# Patient Record
Sex: Female | Born: 1950
Health system: Southern US, Community
[De-identification: ages and names within clinical notes are randomized; demographics above are authoritative.]

## PROBLEM LIST (undated history)

## (undated) DIAGNOSIS — I4819 Other persistent atrial fibrillation: Secondary | ICD-10-CM

## (undated) DIAGNOSIS — R29818 Other symptoms and signs involving the nervous system: Secondary | ICD-10-CM

## (undated) DIAGNOSIS — R0683 Snoring: Secondary | ICD-10-CM

## (undated) DIAGNOSIS — I5032 Chronic diastolic (congestive) heart failure: Secondary | ICD-10-CM

## (undated) DIAGNOSIS — G4734 Idiopathic sleep related nonobstructive alveolar hypoventilation: Secondary | ICD-10-CM

## (undated) DIAGNOSIS — N182 Chronic kidney disease, stage 2 (mild): Secondary | ICD-10-CM

## (undated) DIAGNOSIS — I10 Essential (primary) hypertension: Secondary | ICD-10-CM

## (undated) DIAGNOSIS — D509 Iron deficiency anemia, unspecified: Secondary | ICD-10-CM

## (undated) DIAGNOSIS — R942 Abnormal results of pulmonary function studies: Secondary | ICD-10-CM

## (undated) DIAGNOSIS — C189 Malignant neoplasm of colon, unspecified: Secondary | ICD-10-CM

## (undated) HISTORY — DX: Chronic kidney disease, stage 2 (mild): N18.2

## (undated) HISTORY — PX: TONSILLECTOMY: SUR1361

## (undated) HISTORY — DX: Abnormal results of pulmonary function studies: R94.2

## (undated) HISTORY — DX: Idiopathic sleep related nonobstructive alveolar hypoventilation: G47.34

## (undated) HISTORY — DX: Other symptoms and signs involving the nervous system: R29.818

## (undated) HISTORY — DX: Malignant neoplasm of colon, unspecified: C18.9

## (undated) HISTORY — DX: Chronic diastolic (congestive) heart failure: I50.32

## (undated) HISTORY — DX: Iron deficiency anemia, unspecified: D50.9

---

## 2003-04-09 ENCOUNTER — Emergency Department (HOSPITAL_COMMUNITY): Admission: EM | Admit: 2003-04-09 | Discharge: 2003-04-09 | Payer: Self-pay | Admitting: Emergency Medicine

## 2004-10-03 ENCOUNTER — Emergency Department (HOSPITAL_COMMUNITY): Admission: EM | Admit: 2004-10-03 | Discharge: 2004-10-03 | Payer: Self-pay | Admitting: Emergency Medicine

## 2015-09-06 ENCOUNTER — Emergency Department (HOSPITAL_COMMUNITY): Payer: Medicare (Managed Care)

## 2015-09-06 ENCOUNTER — Encounter (HOSPITAL_COMMUNITY): Payer: Self-pay | Admitting: *Deleted

## 2015-09-06 ENCOUNTER — Inpatient Hospital Stay (HOSPITAL_COMMUNITY)
Admission: EM | Admit: 2015-09-06 | Discharge: 2015-09-15 | DRG: 308 | Disposition: A | Discharge status: Home / Self Care | Payer: Medicare (Managed Care) | Attending: Internal Medicine | Admitting: Internal Medicine

## 2015-09-06 DIAGNOSIS — Z6841 Body Mass Index (BMI) 40.0 and over, adult: Secondary | ICD-10-CM | POA: Diagnosis not present

## 2015-09-06 DIAGNOSIS — I110 Hypertensive heart disease with heart failure: Secondary | ICD-10-CM | POA: Diagnosis present

## 2015-09-06 DIAGNOSIS — I5023 Acute on chronic systolic (congestive) heart failure: Secondary | ICD-10-CM | POA: Diagnosis present

## 2015-09-06 DIAGNOSIS — R601 Generalized edema: Secondary | ICD-10-CM | POA: Diagnosis not present

## 2015-09-06 DIAGNOSIS — I5021 Acute systolic (congestive) heart failure: Secondary | ICD-10-CM | POA: Diagnosis not present

## 2015-09-06 DIAGNOSIS — E876 Hypokalemia: Secondary | ICD-10-CM | POA: Diagnosis not present

## 2015-09-06 DIAGNOSIS — Z888 Allergy status to other drugs, medicaments and biological substances status: Secondary | ICD-10-CM

## 2015-09-06 DIAGNOSIS — J9601 Acute respiratory failure with hypoxia: Secondary | ICD-10-CM | POA: Diagnosis not present

## 2015-09-06 DIAGNOSIS — I420 Dilated cardiomyopathy: Secondary | ICD-10-CM | POA: Diagnosis not present

## 2015-09-06 DIAGNOSIS — Z79899 Other long term (current) drug therapy: Secondary | ICD-10-CM

## 2015-09-06 DIAGNOSIS — R0602 Shortness of breath: Secondary | ICD-10-CM

## 2015-09-06 DIAGNOSIS — R001 Bradycardia, unspecified: Secondary | ICD-10-CM | POA: Insufficient documentation

## 2015-09-06 DIAGNOSIS — R739 Hyperglycemia, unspecified: Secondary | ICD-10-CM | POA: Diagnosis present

## 2015-09-06 DIAGNOSIS — I509 Heart failure, unspecified: Secondary | ICD-10-CM

## 2015-09-06 DIAGNOSIS — J449 Chronic obstructive pulmonary disease, unspecified: Secondary | ICD-10-CM | POA: Diagnosis present

## 2015-09-06 DIAGNOSIS — I639 Cerebral infarction, unspecified: Secondary | ICD-10-CM

## 2015-09-06 DIAGNOSIS — I4891 Unspecified atrial fibrillation: Secondary | ICD-10-CM | POA: Diagnosis not present

## 2015-09-06 DIAGNOSIS — I481 Persistent atrial fibrillation: Principal | ICD-10-CM | POA: Diagnosis present

## 2015-09-06 DIAGNOSIS — G4733 Obstructive sleep apnea (adult) (pediatric): Secondary | ICD-10-CM | POA: Diagnosis present

## 2015-09-06 DIAGNOSIS — J9621 Acute and chronic respiratory failure with hypoxia: Secondary | ICD-10-CM | POA: Diagnosis present

## 2015-09-06 DIAGNOSIS — R55 Syncope and collapse: Secondary | ICD-10-CM | POA: Diagnosis not present

## 2015-09-06 DIAGNOSIS — I272 Other secondary pulmonary hypertension: Secondary | ICD-10-CM | POA: Diagnosis not present

## 2015-09-06 HISTORY — DX: Snoring: R06.83

## 2015-09-06 HISTORY — DX: Other persistent atrial fibrillation: I48.19

## 2015-09-06 HISTORY — DX: Morbid (severe) obesity due to excess calories: E66.01

## 2015-09-06 LAB — PROTIME-INR
INR: 1.22 (ref 0.00–1.49)
PROTHROMBIN TIME: 15.6 s — AB (ref 11.6–15.2)

## 2015-09-06 LAB — APTT: aPTT: 31 seconds (ref 24–37)

## 2015-09-06 LAB — BASIC METABOLIC PANEL
Anion gap: 12 (ref 5–15)
BUN: 14 mg/dL (ref 6–20)
CHLORIDE: 107 mmol/L (ref 101–111)
CO2: 22 mmol/L (ref 22–32)
CREATININE: 0.95 mg/dL (ref 0.44–1.00)
Calcium: 9 mg/dL (ref 8.9–10.3)
GFR calc Af Amer: >60 mL/min (ref >60)
GFR calc non Af Amer: >60 mL/min (ref >60)
Glucose, Bld: 133 mg/dL — ABNORMAL HIGH (ref 65–99)
Potassium: 3.9 mmol/L (ref 3.5–5.1)
SODIUM: 141 mmol/L (ref 135–145)

## 2015-09-06 LAB — CBC WITH DIFFERENTIAL/PLATELET
Basophils Absolute: 0 10*3/uL (ref 0.0–0.1)
Basophils Relative: 0 %
EOS ABS: 0.1 10*3/uL (ref 0.0–0.7)
EOS PCT: 1 %
HCT: 48.9 % — ABNORMAL HIGH (ref 36.0–46.0)
HEMOGLOBIN: 15.2 g/dL — AB (ref 12.0–15.0)
LYMPHS ABS: 1.5 10*3/uL (ref 0.7–4.0)
Lymphocytes Relative: 16 %
MCH: 28.8 pg (ref 26.0–34.0)
MCHC: 31.1 g/dL (ref 30.0–36.0)
MCV: 92.8 fL (ref 78.0–100.0)
MONO ABS: 0.9 10*3/uL (ref 0.1–1.0)
MONOS PCT: 9 %
NEUTROS PCT: 74 %
Neutro Abs: 6.7 10*3/uL (ref 1.7–7.7)
Platelets: 259 10*3/uL (ref 150–400)
RBC: 5.27 MIL/uL — ABNORMAL HIGH (ref 3.87–5.11)
RDW: 14.5 % (ref 11.5–15.5)
WBC: 9.2 10*3/uL (ref 4.0–10.5)

## 2015-09-06 LAB — I-STAT TROPONIN, ED: TROPONIN I, POC: 0 ng/mL (ref 0.00–0.08)

## 2015-09-06 LAB — MAGNESIUM: MAGNESIUM: 1.8 mg/dL (ref 1.7–2.4)

## 2015-09-06 LAB — BRAIN NATRIURETIC PEPTIDE: B Natriuretic Peptide: 149.2 pg/mL — ABNORMAL HIGH (ref 0.0–100.0)

## 2015-09-06 LAB — TSH: TSH: 0.436 u[IU]/mL (ref 0.350–4.500)

## 2015-09-06 MED ORDER — HEPARIN BOLUS VIA INFUSION
4000.0000 [IU] | Freq: Once | INTRAVENOUS | Status: AC
  Administered 2015-09-06: 4000 [IU] via INTRAVENOUS
  Filled 2015-09-06: qty 4000

## 2015-09-06 MED ORDER — METOPROLOL TARTRATE 1 MG/ML IV SOLN
5.0000 mg | Freq: Once | INTRAVENOUS | Status: AC
  Administered 2015-09-06: 5 mg via INTRAVENOUS
  Filled 2015-09-06: qty 5

## 2015-09-06 MED ORDER — DEXTROSE 5 % IV SOLN
INTRAVENOUS | Status: AC
  Administered 2015-09-06: 10 mg/h via INTRAVENOUS
  Filled 2015-09-06: qty 100

## 2015-09-06 MED ORDER — DEXTROSE 5 % IV SOLN
5.0000 mg/h | INTRAVENOUS | Status: DC
  Administered 2015-09-06 (×2): 10 mg/h via INTRAVENOUS
  Filled 2015-09-06 (×2): qty 100

## 2015-09-06 MED ORDER — FUROSEMIDE 10 MG/ML IJ SOLN
80.0000 mg | Freq: Once | INTRAMUSCULAR | Status: AC
  Administered 2015-09-06: 80 mg via INTRAVENOUS
  Filled 2015-09-06: qty 8

## 2015-09-06 MED ORDER — MAGNESIUM SULFATE 2 GM/50ML IV SOLN
2.0000 g | Freq: Once | INTRAVENOUS | Status: AC
Start: 2015-09-06 — End: 2015-09-06
  Administered 2015-09-06: 2 g via INTRAVENOUS
  Filled 2015-09-06: qty 50

## 2015-09-06 MED ORDER — DILTIAZEM LOAD VIA INFUSION
20.0000 mg | INTRAVENOUS | Status: AC | PRN
  Administered 2015-09-06 (×2): 20 mg via INTRAVENOUS
  Filled 2015-09-06: qty 20

## 2015-09-06 MED ORDER — POTASSIUM CHLORIDE 20 MEQ PO PACK: 40.0000 meq | PACK | Freq: Two times a day (BID) | ORAL | Status: DC

## 2015-09-06 MED ORDER — HEPARIN (PORCINE) IN NACL 100-0.45 UNIT/ML-% IJ SOLN
1350.0000 [IU]/h | INTRAMUSCULAR | Status: DC
  Administered 2015-09-06: 1350 [IU]/h via INTRAVENOUS
  Filled 2015-09-06 (×2): qty 250

## 2015-09-06 MED ORDER — POTASSIUM CHLORIDE CRYS ER 20 MEQ PO TBCR
40.0000 meq | EXTENDED_RELEASE_TABLET | Freq: Two times a day (BID) | ORAL | Status: DC
  Administered 2015-09-06 – 2015-09-13 (×14): 40 meq via ORAL
  Filled 2015-09-06 (×14): qty 2

## 2015-09-06 NOTE — Progress Notes (Signed)
ANTICOAGULATION CONSULT NOTE - Initial Consult  Pharmacy Consult for Heparin Indication: Atrial Fibrillation  Allergies  Allergen Reactions  . Compazine [Prochlorperazine Edisylate] Swelling    Patient Measurements: Height: 5\' 7"  (170.2 cm) Weight: (!) 315 lb (142.883 kg) IBW/kg (Calculated) : 61.6 Heparin Dosing Weight: 97kg  Vital Signs: Temp: 97.8 F (36.6 C) (12/01 1823) Temp Source: Oral (12/01 1823) BP: 130/72 mmHg (12/01 1823) Pulse Rate: 186 (12/01 1823)  Labs: No results for input(s): HGB, HCT, PLT, APTT, LABPROT, INR, HEPARINUNFRC, CREATININE, CKTOTAL, CKMB, TROPONINI in the last 72 hours.  CrCl cannot be calculated (Patient has no serum creatinine result on file.).   Medical History: History reviewed. No pertinent past medical history.  Assessment: 58 yoF admitted with worsening SOB and swelling in feet and legs.  Chest xray suggest mild CHF.  EKG shows atrial fibrillation and pharmacy consulted to start IV heparin.  Heparin dosing wt = 77kg.  No issues noted with CBC.  Pt states not taking any blood thinners PTA.  Med history in process.  No SCr result yet for renal function.    Goal of Therapy:  Heparin level 0.3-0.7 units/ml Monitor platelets by anticoagulation protocol: Yes   Plan:  Heparin bolus 4000 units IV x 1, then start infusion at 1350 units/hr = 13.5 ml/hr.  Ordered baseline PT/INR, aPTT.  F/u heparin level in 6 hours.    Ralene Bathe, PharmD, BCPS 09/06/2015, 6:59 PM  Pager: [phone]

## 2015-09-06 NOTE — ED Notes (Signed)
Pt complains of shortness of breath since Thanksgiving, which became worse today. Pt states the breathing is worse with exertion. Pt states she noticed her legs have been swelling over the past month.

## 2015-09-06 NOTE — ED Notes (Signed)
Hospitalist at bedside 

## 2015-09-06 NOTE — H&P (Signed)
[facility] History and Physical  Amber Medina [id number] DOB: 31-Aug-1951 DOA: 09/06/2015  Referring physician: Tanna Furry, M.D. PCP: No primary care provider on file.   Chief Complaint: Shortness of breath.  HPI: Amber Medina is a 64 y.o. female with no previous past medical history who comes to the emergency department with a 2 week history of progressively worse shortness of breath associated with lower extremities and trunk edema. The patient states that her symptoms started worsening at a faster rate after Thanksgiving, when she noticed worsening of dyspnea and swelling, decreased exertional tolerance and increased fatigue. She denies fever, chills, chest pain or chest pressures, palpitations, dizziness, diaphoresis, PND or orthopnea.   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 During the conference, the patient had an upper respiratory tract infection that temporarily worsened her shortness of breath. She did not have significant symptoms until 2 weeks ago, but particularly in the last week.  She has been and on and off smoker to her adult life, but last quit about 5 months ago. She rarely drinks alcohol, she is states that her last lipid panel was normal, with the exception of mildly low HDL 2 years ago when her mother has a history of congenital heart disease, which she could not specify. She denies having any previous cardiac workup.  When seen in the emergency department, the patient was in no acute distress. She is stated that she was feeling better than when she first came into the ED.   Review of Systems:  Constitutional:  Positive weight gain and fatigue.  No weight loss, night sweats, Fevers, chills, HEENT:  No headaches, Difficulty swallowing,Tooth/dental problems,Sore throat,  No sneezing, itching, ear ache, nasal congestion, post nasal  drip,  Cardio-vascular:  Positive swelling in lower extremities, anasarca No chest pain, Orthopnea, PND, dizziness, palpitations  GI:  No heartburn, indigestion, abdominal pain, nausea, vomiting, diarrhea, change in bowel habits, loss of appetite  Resp:  Positive dyspnea worsened by exertion, no cough no hemoptysis no wheezing. Skin:  no rash or lesions.  GU:  no dysuria, change in color of urine, no urgency or frequency. No flank pain.  Musculoskeletal:  No joint pain or swelling. No decreased range of motion. No back pain.  Psych:  No change in mood or affect. No depression or anxiety. No memory loss.   History reviewed. No pertinent past medical history. Past Surgical History  Procedure Laterality Date  . Tonsillectomy     Social History:  reports that she has quit smoking. She does not have any smokeless tobacco history on file. She reports that she drinks alcohol. She reports that she does not use illicit drugs.  Allergies  Allergen Reactions  . Compazine [Prochlorperazine Edisylate] Swelling    Family History  Problem Relation Age of Onset  . Diabetes Mellitus II Mother   . Hypertension Mother   . Congenital heart disease Mother   . Hypertension Father     Prior to Admission medications   Medication Sig Start Date End Date Taking? Authorizing Provider  Ascorbic Acid (VITAMIN C) 1000 MG tablet Take 2,000 mg by mouth daily.   Yes Historical Provider, MD  b complex vitamins tablet Take 1 tablet by mouth daily.   Yes Historical Provider, MD  Cholecalciferol (VITAMIN D PO) Take by mouth.   Yes Historical Provider, MD  diphenhydrAMINE (BENADRYL) 25 mg capsule  Take 25 mg by mouth every 6 (six) hours as needed for itching or allergies.   Yes Historical Provider, MD  Hyaluronic Acid-Vitamin C (HYALURONIC ACID PO) Take by mouth.   Yes Historical Provider, MD  Multiple Vitamin (MULTIVITAMIN WITH MINERALS) TABS tablet Take 1 tablet by mouth daily.   Yes Historical Provider, MD    OVER THE COUNTER MEDICATION as directed. Allergy eye drops.   Yes Historical Provider, MD   Physical Exam: Filed Vitals:   09/06/15 1910 09/06/15 2032 09/06/15 2100 09/06/15 2142  BP: 126/74 107/84 114/86 135/115  Pulse: 120 68 122 54  Temp:      TempSrc:      Resp: 21 16 27 19   Height:      Weight:      SpO2: 96% 94% 96% 96%    Wt Readings from Last 3 Encounters:  09/06/15 142.883 kg (315 lb)    General:  Appears calm and comfortable Eyes: PERRL, normal lids, irises & conjunctiva ENT: grossly normal hearing, lips and oral mucosa are moist. Neck: no LAD, masses or thyromegaly Cardiovascular: Irregularly irregular, tachycardic, no murmur or rub. 4+ bilateral LE edema. Telemetry: Atrial fibrillation with RVR at 126 bpm. Respiratory: Bibasilar Rales. No wheezing, no rhonchi. Normal respiratory effort. Abdomen: Obese, anasarca, soft, ntnd Skin: no rash or induration seen on limited exam Musculoskeletal: grossly normal tone BUE/BLE Psychiatric: grossly normal mood and affect, speech fluent and appropriate Neurologic: grossly non-focal.          Labs on Admission:  Basic Metabolic Panel:  Recent Labs Lab 09/06/15 1839  NA 141  K 3.9  CL 107  CO2 22  GLUCOSE 133*  BUN 14  CREATININE 0.95  CALCIUM 9.0   CBC:  Recent Labs Lab 09/06/15 1839  WBC 9.2  NEUTROABS 6.7  HGB 15.2*  HCT 48.9*  MCV 92.8  PLT 259    BNP (last 3 results)  Recent Labs  09/06/15 1839  BNP 149.2*   APTT [id number] Collected: 09/06/15 1839    Updated: 09/06/15 1902    Specimen Type: Blood     aPTT 31 seconds   Protime-INR [id number] (Abnormal) Collected: 09/06/15 1839   Updated: 09/06/15 1902    Specimen Type: Blood     Prothrombin Time 15.6 (H) seconds    INR 1.22   I-stat troponin, ED [id number] Collected: 09/06/15 1846   Updated: 09/06/15 1857    Specimen Type: Blood     Troponin i, poc 0.00 ng/mL     Radiological Exams on Admission: Dg Chest Port 1  View  09/06/2015  CLINICAL DATA:  64 year old female with shortness of breath 1 week ago, and swelling in the feet and legs over the last month. EXAM: PORTABLE CHEST 1 VIEW COMPARISON:  No priors. FINDINGS: There is cephalization of the pulmonary vasculature and slight indistinctness of the interstitial markings suggestive of mild pulmonary edema. Mild cardiomegaly. No definite pleural effusions. Upper mediastinal contours are within normal limits. IMPRESSION: 1. The appearance the chest suggests mild congestive heart failure, as above. Electronically Signed   By: Vinnie Langton M.D.   On: 09/06/2015 18:51    EKG: Independently reviewed. Vent. rate 186 BPM PR interval * ms QRS duration 83 ms QT/QTc 267/470 ms P-R-T axes -1 124 -1 Atrial fibrillation with rapid V-rate Left posterior fascicular block Anterior infarct, old Nonspecific T abnormalities, lateral leads Baseline wander in lead(s) V2  Assessment/Plan Principal Problem:   Atrial fibrillation with rapid ventricular response (HCC) Admit the patient to a  stepdown for closer monitoring. Continue IV Cardizem which has partially control the heart rate from the 180s to the 120s. I will add metoprolol 5 mg IV push 1 dose, metoprolol tartrate 25 mg orally single dose tonight and start long-acting metoprolol succinate 50 mg by mouth daily. Check magnesium level and supplement with magnesium sulfate. Cardiology will be evaluating in the morning.  Active Problems:   Hyperglycemia No history of diabetes per patient. She states that 2 years ago her blood glucose and other test were normal. Check hemoglobin A1c in the morning.    Anasarca Continue IV furosemide. Supplemental potassium. Monitor input and output. Start Lisinopril 5 mg by mouth daily tomorrow, once the Cardizem infusion has been taper off. Check echocardiogram.    Obesity, morbid ([place]) Lifestyle modifications were discussed briefly with the patient. She is eager to  make changes in the future that will impact her health in a positive way.  Cardiology was consulted by the emergency department.  Code Status: Full code. DVT Prophylaxis: On full dose IV heparin. Family Communication: Disposition Plan: Admit to a stepdown for heart rate control and further workup.  Time spent: Over 90 minutes were spent during the process of this admission.  Reubin Milan [facility] Pager [phone].

## 2015-09-06 NOTE — ED Provider Notes (Signed)
CSN: [id number]     Arrival date & time 09/06/15  1801 History   First MD Initiated Contact with Patient 09/06/15 1834     No chief complaint on file.     HPI  She presents for evaluation of palpitations congestive heart failure difficulty breathing.  Patient reports shortness of breath for the last month. Acutely worse for the last 48 hours of palpitations and dyspnea.  Overall she relates symptoms back to the end of August. She had a deadline for a project. She feels like she "caught a cold or a virus". She returned from a  Conference and felt poorly with a cough for several weeks. 2 October she started noticing dyspnea with exerting herself but no chest pain. Starting approximately one or 2 weeks ago had marked worsening of this and started developing lower extremity edema that is progressed to involve abdominal swelling. Starting yesterday she felt acutely short of breath and was unable to lay flat. Felt rapid palpitations today and felt "suffocated" presents here tachycardic and dyspneic.  History reviewed. No pertinent past medical history. Past Surgical History  Procedure Laterality Date  . Tonsillectomy     Family History  Problem Relation Age of Onset  . Diabetes Mellitus II Mother   . Hypertension Mother   . Congenital heart disease Mother   . Hypertension Father    Social History  Substance Use Topics  . Smoking status: Former Research scientist (life sciences)  . Smokeless tobacco: None  . Alcohol Use: Yes   OB History    No data available     Review of Systems  Constitutional: Negative for fever, chills, diaphoresis, appetite change and fatigue.  HENT: Negative for mouth sores, sore throat and trouble swallowing.   Eyes: Negative for visual disturbance.  Respiratory: Positive for cough, chest tightness, shortness of breath and wheezing.   Cardiovascular: Positive for palpitations and leg swelling. Negative for chest pain.  Gastrointestinal: Positive for abdominal distention. Negative for  nausea, vomiting, abdominal pain and diarrhea.  Endocrine: Negative for polydipsia, polyphagia and polyuria.  Genitourinary: Negative for dysuria, frequency and hematuria.  Musculoskeletal: Negative for gait problem.  Skin: Negative for color change, pallor and rash.  Neurological: Negative for dizziness, syncope, light-headedness and headaches.  Hematological: Does not bruise/bleed easily.  Psychiatric/Behavioral: Negative for behavioral problems and confusion.      Allergies  Compazine  Home Medications   Prior to Admission medications   Medication Sig Start Date End Date Taking? Authorizing Provider  Ascorbic Acid (VITAMIN C) 1000 MG tablet Take 2,000 mg by mouth daily.   Yes Historical Provider, MD  b complex vitamins tablet Take 1 tablet by mouth daily.   Yes Historical Provider, MD  Cholecalciferol (VITAMIN D PO) Take by mouth.   Yes Historical Provider, MD  diphenhydrAMINE (BENADRYL) 25 mg capsule Take 25 mg by mouth every 6 (six) hours as needed for itching or allergies.   Yes Historical Provider, MD  Hyaluronic Acid-Vitamin C (HYALURONIC ACID PO) Take by mouth.   Yes Historical Provider, MD  Multiple Vitamin (MULTIVITAMIN WITH MINERALS) TABS tablet Take 1 tablet by mouth daily.   Yes Historical Provider, MD  OVER THE COUNTER MEDICATION as directed. Allergy eye drops.   Yes Historical Provider, MD   BP 123/77 mmHg  Pulse 91  Temp(Src) 97.8 F (36.6 C) (Oral)  Resp 18  Ht 5\' 7"  (1.702 m)  Wt 315 lb (142.883 kg)  BMI 49.32 kg/m2  SpO2 96% Physical Exam  Constitutional: She is oriented to person,  place, and time. She appears distressed.  Wonderfully pleasant obese female dyspneic speaking one to 2 words at a time.  HENT:  Head: Normocephalic.  Eyes: Conjunctivae are normal. Pupils are equal, round, and reactive to light. No scleral icterus.  Neck: Normal range of motion. Neck supple. No thyromegaly present.    Cardiovascular: An irregularly irregular rhythm present.  Tachycardia present.  Exam reveals no gallop and no friction rub.   No murmur heard. AF with RVR on the monitor.  Pulmonary/Chest: Effort normal and breath sounds normal. No respiratory distress. She has no wheezes. She has no rales.  Bibasilar crackles. Overall distant breath sounds.  Abdominal: Soft. Bowel sounds are normal. She exhibits no distension. There is no tenderness. There is no rebound.  Musculoskeletal: Normal range of motion.  Neurological: She is alert and oriented to person, place, and time.  Skin: Skin is warm. No rash noted. She is diaphoretic.  Bilateral lower extremity edema except extending to the anterior abdomen abdominal wall and sacrum  Psychiatric: She has a normal mood and affect. Her behavior is normal.    ED Course  Procedures (including critical care time) Labs Review Labs Reviewed  CBC WITH DIFFERENTIAL/PLATELET - Abnormal; Notable for the following:    RBC 5.27 (*)    Hemoglobin 15.2 (*)    HCT 48.9 (*)    All other components within normal limits  BASIC METABOLIC PANEL - Abnormal; Notable for the following:    Glucose, Bld 133 (*)    All other components within normal limits  BRAIN NATRIURETIC PEPTIDE - Abnormal; Notable for the following:    B Natriuretic Peptide 149.2 (*)    All other components within normal limits  PROTIME-INR - Abnormal; Notable for the following:    Prothrombin Time 15.6 (*)    All other components within normal limits  APTT  TSH  HEPARIN LEVEL (UNFRACTIONATED)  MAGNESIUM  I-STAT TROPOININ, ED    Imaging Review Dg Chest Port 1 View  09/06/2015  CLINICAL DATA:  64 year old female with shortness of breath 1 week ago, and swelling in the feet and legs over the last month. EXAM: PORTABLE CHEST 1 VIEW COMPARISON:  No priors. FINDINGS: There is cephalization of the pulmonary vasculature and slight indistinctness of the interstitial markings suggestive of mild pulmonary edema. Mild cardiomegaly. No definite pleural effusions.  Upper mediastinal contours are within normal limits. IMPRESSION: 1. The appearance the chest suggests mild congestive heart failure, as above. Electronically Signed   By: Vinnie Langton M.D.   On: 09/06/2015 18:51   I have personally reviewed and evaluated these images and lab results as part of my medical decision-making.   EKG Interpretation None      MDM   Final diagnoses:  Atrial fibrillation with rapid ventricular response (HCC)  Congestive heart failure, unspecified congestive heart failure chronicity, unspecified congestive heart failure type [facility])    Patient given Cardizem bolus and infusion. Re-bolused with second dose IV Cardizem. Has improvement in rate down to between 95 and 105. Blood pressure remains stable. Does not require acute blood pressure control. Given IV Lasix. Has started to diurese. Clinically and radiographically has minimal left-sided failure. Has marked dependent edema and anasarca. Given IV heparin. I discussed the case with cardiology. They recommend overnight admit here, and will consult in am.  D/W Dr. Olevia Bowens of [facility].  Here planning admit.  CRITICAL CARE Performed by: Tanna Furry JOSEPH   Total critical care time: 60 minutes  Critical care time was exclusive  of separately billable procedures and treating other patients.  Critical care was necessary to treat or prevent imminent or life-threatening deterioration.  Critical care was time spent personally by me on the following activities: development of treatment plan with patient and/or surrogate as well as nursing, discussions with consultants, evaluation of patient's response to treatment, examination of patient, obtaining history from patient or surrogate, ordering and performing treatments and interventions, ordering and review of laboratory studies, ordering and review of radiographic studies, pulse oximetry and re-evaluation of patient's condition. Admission and [place] in a.m.  Cardiology consultation unless the patient becomes unstable. Discussed with Dr. Olevia Bowens of hospitalist. He is here evaluate the patient planning admission.    Tanna Furry, MD 09/06/15 2223

## 2015-09-07 ENCOUNTER — Inpatient Hospital Stay (HOSPITAL_COMMUNITY): Payer: Medicare (Managed Care)

## 2015-09-07 DIAGNOSIS — R55 Syncope and collapse: Secondary | ICD-10-CM

## 2015-09-07 DIAGNOSIS — I469 Cardiac arrest, cause unspecified: Secondary | ICD-10-CM

## 2015-09-07 DIAGNOSIS — I5021 Acute systolic (congestive) heart failure: Secondary | ICD-10-CM | POA: Diagnosis present

## 2015-09-07 DIAGNOSIS — I4891 Unspecified atrial fibrillation: Secondary | ICD-10-CM

## 2015-09-07 DIAGNOSIS — I420 Dilated cardiomyopathy: Secondary | ICD-10-CM

## 2015-09-07 LAB — TROPONIN I
Troponin I: 0.03 ng/mL (ref <0.031)
Troponin I: 0.04 ng/mL — ABNORMAL HIGH (ref <0.031)

## 2015-09-07 LAB — CBC WITH DIFFERENTIAL/PLATELET
BASOS PCT: 0 %
Basophils Absolute: 0 10*3/uL (ref 0.0–0.1)
Eosinophils Absolute: 0.1 10*3/uL (ref 0.0–0.7)
Eosinophils Relative: 1 %
HEMATOCRIT: 45.2 % (ref 36.0–46.0)
Hemoglobin: 14.3 g/dL (ref 12.0–15.0)
Lymphocytes Relative: 11 %
Lymphs Abs: 1.1 10*3/uL (ref 0.7–4.0)
MCH: 28.9 pg (ref 26.0–34.0)
MCHC: 31.6 g/dL (ref 30.0–36.0)
MCV: 91.3 fL (ref 78.0–100.0)
MONO ABS: 0.9 10*3/uL (ref 0.1–1.0)
MONOS PCT: 9 %
NEUTROS ABS: 7.9 10*3/uL — AB (ref 1.7–7.7)
Neutrophils Relative %: 79 %
Platelets: 227 10*3/uL (ref 150–400)
RBC: 4.95 MIL/uL (ref 3.87–5.11)
RDW: 14.5 % (ref 11.5–15.5)
WBC: 10 10*3/uL (ref 4.0–10.5)

## 2015-09-07 LAB — COMPREHENSIVE METABOLIC PANEL
ALBUMIN: 3.5 g/dL (ref 3.5–5.0)
ALK PHOS: 57 U/L (ref 38–126)
ALT: 34 U/L (ref 14–54)
ANION GAP: 10 (ref 5–15)
AST: 34 U/L (ref 15–41)
BUN: 19 mg/dL (ref 6–20)
CALCIUM: 8.9 mg/dL (ref 8.9–10.3)
CO2: 26 mmol/L (ref 22–32)
Chloride: 107 mmol/L (ref 101–111)
Creatinine, Ser: 1.18 mg/dL — ABNORMAL HIGH (ref 0.44–1.00)
GFR calc Af Amer: 54 mL/min — ABNORMAL LOW (ref >60)
GFR calc non Af Amer: 47 mL/min — ABNORMAL LOW (ref >60)
GLUCOSE: 122 mg/dL — AB (ref 65–99)
Potassium: 3.8 mmol/L (ref 3.5–5.1)
SODIUM: 143 mmol/L (ref 135–145)
Total Bilirubin: 1.5 mg/dL — ABNORMAL HIGH (ref 0.3–1.2)
Total Protein: 6.4 g/dL — ABNORMAL LOW (ref 6.5–8.1)

## 2015-09-07 LAB — BASIC METABOLIC PANEL
ANION GAP: 7 (ref 5–15)
BUN: 18 mg/dL (ref 6–20)
CHLORIDE: 107 mmol/L (ref 101–111)
CO2: 28 mmol/L (ref 22–32)
CREATININE: 1.04 mg/dL — AB (ref 0.44–1.00)
Calcium: 8.9 mg/dL (ref 8.9–10.3)
GFR calc non Af Amer: 55 mL/min — ABNORMAL LOW (ref >60)
Glucose, Bld: 123 mg/dL — ABNORMAL HIGH (ref 65–99)
POTASSIUM: 4.6 mmol/L (ref 3.5–5.1)
SODIUM: 142 mmol/L (ref 135–145)

## 2015-09-07 LAB — HEPARIN LEVEL (UNFRACTIONATED)
HEPARIN UNFRACTIONATED: 0.28 [IU]/mL — AB (ref 0.30–0.70)
HEPARIN UNFRACTIONATED: 0.35 [IU]/mL (ref 0.30–0.70)
HEPARIN UNFRACTIONATED: 0.4 [IU]/mL (ref 0.30–0.70)

## 2015-09-07 LAB — D-DIMER, QUANTITATIVE (NOT AT ARMC): D DIMER QUANT: 0.72 ug{FEU}/mL — AB (ref 0.00–0.50)

## 2015-09-07 LAB — GLUCOSE, CAPILLARY: GLUCOSE-CAPILLARY: 188 mg/dL — AB (ref 65–99)

## 2015-09-07 LAB — MRSA PCR SCREENING: MRSA BY PCR: NEGATIVE

## 2015-09-07 LAB — PHOSPHORUS: Phosphorus: 4.2 mg/dL (ref 2.5–4.6)

## 2015-09-07 MED ORDER — METOPROLOL SUCCINATE ER 25 MG PO TB24: 50.0000 mg | ORAL_TABLET | Freq: Every day | ORAL | Status: DC

## 2015-09-07 MED ORDER — FUROSEMIDE 10 MG/ML IJ SOLN
40.0000 mg | Freq: Four times a day (QID) | INTRAMUSCULAR | Status: DC
  Administered 2015-09-07: 40 mg via INTRAVENOUS
  Filled 2015-09-07: qty 4

## 2015-09-07 MED ORDER — FUROSEMIDE 10 MG/ML IJ SOLN
60.0000 mg | Freq: Four times a day (QID) | INTRAMUSCULAR | Status: DC
  Administered 2015-09-07 – 2015-09-08 (×4): 60 mg via INTRAVENOUS
  Filled 2015-09-07 (×5): qty 6

## 2015-09-07 MED ORDER — RIVAROXABAN 20 MG PO TABS: 20.0000 mg | ORAL_TABLET | Freq: Every day | ORAL | Status: DC

## 2015-09-07 MED ORDER — HEPARIN (PORCINE) IN NACL 100-0.45 UNIT/ML-% IJ SOLN
1600.0000 [IU]/h | INTRAMUSCULAR | Status: DC
  Administered 2015-09-07 (×2): 1600 [IU]/h via INTRAVENOUS
  Filled 2015-09-07 (×2): qty 250

## 2015-09-07 MED ORDER — IOHEXOL 300 MG/ML  SOLN
100.0000 mL | Freq: Once | INTRAMUSCULAR | Status: AC | PRN
  Administered 2015-09-07: 100 mL via INTRAVENOUS

## 2015-09-07 MED ORDER — LISINOPRIL 2.5 MG PO TABS
5.0000 mg | ORAL_TABLET | Freq: Every day | ORAL | Status: DC
  Administered 2015-09-07 – 2015-09-11 (×5): 5 mg via ORAL
  Filled 2015-09-07 (×6): qty 2

## 2015-09-07 MED ORDER — IOHEXOL 350 MG/ML SOLN: 100.0000 mL | Freq: Once | INTRAVENOUS | Status: DC | PRN

## 2015-09-07 MED ORDER — METOPROLOL TARTRATE 25 MG PO TABS
25.0000 mg | ORAL_TABLET | Freq: Once | ORAL | Status: AC
  Administered 2015-09-07: 25 mg via ORAL
  Filled 2015-09-07: qty 1

## 2015-09-07 MED ORDER — ALPRAZOLAM 0.5 MG PO TABS: 0.5000 mg | ORAL_TABLET | Freq: Three times a day (TID) | ORAL | Status: DC | PRN

## 2015-09-07 MED ORDER — PERFLUTREN LIPID MICROSPHERE
1.0000 mL | INTRAVENOUS | Status: AC | PRN
  Administered 2015-09-07: 2 mL via INTRAVENOUS
  Filled 2015-09-07: qty 10

## 2015-09-07 MED ORDER — LISINOPRIL 2.5 MG PO TABS: 5.0000 mg | ORAL_TABLET | Freq: Every day | ORAL | Status: DC

## 2015-09-07 MED ORDER — MAGNESIUM SULFATE 2 GM/50ML IV SOLN
2.0000 g | Freq: Once | INTRAVENOUS | Status: AC
  Administered 2015-09-07: 2 g via INTRAVENOUS
  Filled 2015-09-07: qty 50

## 2015-09-07 MED ORDER — HEPARIN (PORCINE) IN NACL 100-0.45 UNIT/ML-% IJ SOLN
1800.0000 [IU]/h | INTRAMUSCULAR | Status: DC
  Administered 2015-09-07: 1600 [IU]/h via INTRAVENOUS
  Administered 2015-09-08 – 2015-09-09 (×2): 1800 [IU]/h via INTRAVENOUS
  Filled 2015-09-07 (×5): qty 250

## 2015-09-07 MED ORDER — METOPROLOL TARTRATE 25 MG PO TABS: 25.0000 mg | ORAL_TABLET | Freq: Two times a day (BID) | ORAL | Status: DC

## 2015-09-07 NOTE — Progress Notes (Signed)
  Echocardiogram 2D Echocardiogram has been performed.  Amber Medina 09/07/2015, 9:36 AM

## 2015-09-07 NOTE — Progress Notes (Addendum)
Pt was on commode in bathroom and passed out for a second. No apparent injuries. BP 160s. HR 80s. RN reviewed tele and no pauses noted during event. Pt had prodrome of dizziness but doesn't remember the actual event. Is on Heparin drip for Afib. Mental status normal immediately after event. Pt placed on bedrest.  Pt has scheduled echo today. Ordered carotid US bilat and CT head stat. Will defer MRI brain to attending this am. Neuro checks.  Clance Boll, NP [facility] Addendum: Pt refused CT head even after explanation of why it was indicated and risks of not getting it done.

## 2015-09-07 NOTE — Progress Notes (Addendum)
   09/07/15 1100  Clinical Encounter Type  Visited With Patient;Other (Comment) (neighbor)  Visit Type Initial;Critical Care;Psychological support;Spiritual support;Social support  Referral From Other (Comment) (rounding d/t information at huddle)  Consult/Referral To Chaplain  Recommendations emotinoal support around health changes  Spiritual Encounters  Spiritual Needs Emotional  Stress Factors  Patient Stress Factors Health changes   Chaplain saw pt rounding due to recent admission, health changes, pt's refusal of CT, recent cardiac events.    Chaplain introduced spiritual care as resource and provided brief emotional support with pt around admission.  Pt's neighbor at bedside.   Ms. Bennison spoke about having avoided going to MD and generally "taking care of things myself."  Hopeful that "they tell me to take an Asprin and go home," but she is aware that she is beginning to need to seek care for her health changes.  Spoke briefly with chaplain about tension around longing to be independent and take care of herself and now being hospitalized.  Encounter was brief due to neighbor's presence at bedside.  Will follow up for continued support around health changes and need for medical interventions.    Ms. Abramowicz has daughter in [place] who is Therapist, sports.  And son in [place], [place].  She states daughter "has been talking to me about going to doctor."    Ms. Buntin feels supported by family and neighbor.      Jerene Pitch [place]

## 2015-09-07 NOTE — Progress Notes (Signed)
Report called to [facility] RN at 2025 for transfer to [facility]. Patient alert and oriented, vital signs stable. Patient transported off unit with [facility] at 2050.

## 2015-09-07 NOTE — Progress Notes (Signed)
CT angio chest and head CT reviewed CT chest negative for PE. Head CT showing possible subacute left occipital infarct versus encephalomalacia. Given her episode of syncope and dizziness recommend MRI/MRA brain to rule out possible stroke. Discussed with patient who does not want to have it done at this time and one some time to think about it. She reasons that the thought of getting an MRi makes her very nervous. I have emphasized her the need to have an MRI to rule out possible underlying stroke. I have placed an order in case she changes her mind.

## 2015-09-07 NOTE — Progress Notes (Addendum)
ANTICOAGULATION CONSULT NOTE - F/u Consult  Pharmacy Consult for Heparin Indication: Atrial Fibrillation  Allergies  Allergen Reactions  . Compazine [Prochlorperazine Edisylate] Swelling    Patient Measurements: Height: 5\' 7"  (170.2 cm) Weight: (!) 355 lb 6.1 oz (161.2 kg) IBW/kg (Calculated) : 61.6 Heparin Dosing Weight: 97kg  Vital Signs: Temp: 98.4 F (36.9 C) (12/02 0800) Temp Source: Oral (12/02 0800) BP: 125/76 mmHg (12/02 0615) Pulse Rate: 74 (12/02 0615)  Labs:  Recent Labs  09/06/15 1839 09/07/15 0150 09/07/15 0655 09/07/15 0926  HGB 15.2*  --  14.3  --   HCT 48.9*  --  45.2  --   PLT 259  --  227  --   APTT 31  --   --   --   LABPROT 15.6*  --   --   --   INR 1.22  --   --   --   HEPARINUNFRC  --  0.28*  --  0.35  CREATININE 0.95 1.04* 1.18*  --   TROPONINI  --  0.03 0.04*  --     Estimated Creatinine Clearance: 75.1 mL/min (by C-G formula based on Cr of 1.18).   Medical History: History reviewed. No pertinent past medical history.  Assessment: 11 yoF admitted with worsening SOB and swelling in feet and legs.  Chest xray suggest mild CHF.  EKG shows atrial fibrillation and pharmacy consulted to start IV heparin.  Heparin dosing wt = 97kg.  No issues noted with CBC.  Pt states not taking any blood thinners PTA.  CrCl~85 ml/min Today, 12/2  0150 HL=0.28, no problems per RN  0926 HL=0.35, no problems per RN (pt fell this am but no bleeding/issues noted)  Goal of Therapy:  Heparin level 0.3-0.7 units/ml Monitor platelets by anticoagulation protocol: Yes   Plan:   continue Heparin drip at 1600 units/hr  Recheck HL in 6 hours  Daily CBC/HL  Dolly Rias RPh 09/07/2015, 10:26 AM Pager [phone]

## 2015-09-07 NOTE — Progress Notes (Addendum)
ANTICOAGULATION CONSULT NOTE - F/u Consult  Pharmacy Consult for Heparin Indication: Atrial Fibrillation  Allergies  Allergen Reactions  . Compazine [Prochlorperazine Edisylate] Swelling    Patient Measurements: Height: 5\' 7"  (170.2 cm) Weight: (!) 357 lb 5.9 oz (162.1 kg) IBW/kg (Calculated) : 61.6 Heparin Dosing Weight: 97kg  Vital Signs: Temp: 98.7 F (37.1 C) (12/02 0000) Temp Source: Oral (12/02 0000) BP: 128/82 mmHg (12/02 0205) Pulse Rate: 82 (12/02 0205)  Labs:  Recent Labs  09/06/15 1839 09/07/15 0150  HGB 15.2*  --   HCT 48.9*  --   PLT 259  --   APTT 31  --   LABPROT 15.6*  --   INR 1.22  --   HEPARINUNFRC  --  0.28*  CREATININE 0.95 1.04*  TROPONINI  --  0.03    Estimated Creatinine Clearance: 85.5 mL/min (by C-G formula based on Cr of 1.04).   Medical History: History reviewed. No pertinent past medical history.  Assessment: 59 yoF admitted with worsening SOB and swelling in feet and legs.  Chest xray suggest mild CHF.  EKG shows atrial fibrillation and pharmacy consulted to start IV heparin.  Heparin dosing wt = 97kg.  No issues noted with CBC.  Pt states not taking any blood thinners PTA.  CrCl~85 ml/min Today, 12/2  0150 HL=0.28, no problems per RN  Goal of Therapy:  Heparin level 0.3-0.7 units/ml Monitor platelets by anticoagulation protocol: Yes   Plan:   Increase Heparin drip to 1600 units/hr  Recheck HL in 6 hours  Daily CBC/HL  Dorrene German 09/07/2015, 2:45 AM

## 2015-09-07 NOTE — Consult Note (Addendum)
Admit date: 09/06/2015 Referring Physician  Dr. Clementeen Graham Primary Physician  None Primary Cardiologist  None Reason for Consultation  Atrial fibrillation with RVR  HPI: Amber Medina is a 64 y.o. female with no previous past medical history who comes to the emergency department with a 2 week history of progressively worsening shortness of breath associated with lower extremities and trunk edema. The patient states that her symptoms started worsening at a faster rate after Thanksgiving, when she noticed worsening of dyspnea and swelling, decreased exertional tolerance and increased fatigue. She denies fever, chills, chest pain or chest pressures, palpitations, dizziness, diaphoresis, PND or orthopnea.   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 During the conference, the patient had an upper respiratory tract infection that temporarily worsened her shortness of breath. She did not have significant symptoms until 2 weeks ago, but particularly in the last week.  She has been and on and off smoker to her adult life, but last quit about 5 months ago. She rarely drinks alcohol, she is states that her last lipid panel was normal, with the exception of mildly low HDL 2 years ago when her mother has a history of congenital heart disease, which she could not specify. She denies having any previous cardiac workup.  In ER she was markedly edematous with anasarca and was noted to be in atrial fibrillation with RVR.  Chest xray showed mild CHF.  She was started on IV Lasix.  She was also started on IV lopressor for rate control.  Cardiology is now asked to evaluate.  2D echo showed at least mildly reduced LVF as well as mildly dilated and reduced RVF.  There is a trivial pericardial effusion.  TSH is normal.  This am patient had dizziness with ? syncope - this was very brief while sitting on  the commode.  BP at the time was 99991111 systolic.  And HR 80's.  Telemetry showed no pauses at that time.  She had another episode this am where her face went blue and O2 sats dropped to the 70's.  Telemetry showed bradycardia and then reportedly HR in the 20's.  The telemetry was reviewed and HR definitely was low but then cannot tell if patient went asystolic for a few seconds or leads came off.      PMH:  History reviewed. No pertinent past medical history.   PSH:   Past Surgical History  Procedure Laterality Date  . Tonsillectomy      Allergies:  Compazine Prior to Admit Meds:   Prescriptions prior to admission  Medication Sig Dispense Refill Last Dose  . Ascorbic Acid (VITAMIN C) 1000 MG tablet Take 2,000 mg by mouth daily.   Past Month at Unknown time  . b complex vitamins tablet Take 1 tablet by mouth daily.   Past Month at Unknown time  . Cholecalciferol (VITAMIN D PO) Take by mouth.   Past Month at Unknown time  . diphenhydrAMINE (BENADRYL) 25 mg capsule Take 25 mg by mouth every 6 (six) hours as needed for itching or allergies.   Past Week at Unknown time  . Hyaluronic Acid-Vitamin C (HYALURONIC ACID PO) Take by mouth.   Past Month at Unknown time  . Multiple Vitamin (MULTIVITAMIN WITH MINERALS) TABS tablet Take 1 tablet by mouth daily.   Past Month at Unknown time  . OVER THE COUNTER MEDICATION  as directed. Allergy eye drops.   Past Month at Unknown time   Fam HX:    Family History  Problem Relation Age of Onset  . Diabetes Mellitus II Mother   . Hypertension Mother   . Congenital heart disease Mother   . Hypertension Father    Social HX:    Social History   Social History  . Marital Status: Single    Spouse Name: N/A  . Number of Children: N/A  . Years of Education: N/A   Occupational History  . Not on file.   Social History Main Topics  . Smoking status: Former Research scientist (life sciences)  . Smokeless tobacco: Not on file  . Alcohol Use: Yes  . Drug Use: No  . Sexual Activity:  Not on file   Other Topics Concern  . Not on file   Social History Narrative  . No narrative on file     ROS:  All 11 ROS were addressed and are negative except what is stated in the HPI  Physical Exam: Blood pressure 142/80, pulse 114, temperature 98.4 F (36.9 C), temperature source Oral, resp. rate 19, height 5\' 7"  (1.702 m), weight 161.2 kg (355 lb 6.1 oz), SpO2 96 %.    General: Well developed, well nourished, in no acute distress Head: Eyes PERRLA, No xanthomas.   Normal cephalic and atramatic  Lungs:   Clear bilaterally to auscultation and percussion. Heart:   Irregularly irregular S1 S2 Pulses are 2+ & equal. Abdomen: Bowel sounds are positive, abdomen soft and non-tender without masses Extremities:   No clubbing, cyanosis or edema.  DP +1 Neuro: Alert and oriented X 3. Psych:  Good affect, responds appropriately    Labs:   Lab Results  Component Value Date   WBC 10.0 09/07/2015   HGB 14.3 09/07/2015   HCT 45.2 09/07/2015   MCV 91.3 09/07/2015   PLT 227 09/07/2015    Recent Labs Lab 09/07/15 0655  NA 143  K 3.8  CL 107  CO2 26  BUN 19  CREATININE 1.18*  CALCIUM 8.9  PROT 6.4*  BILITOT 1.5*  ALKPHOS 57  ALT 34  AST 34  GLUCOSE 122*   No results found for: PTT Lab Results  Component Value Date   INR 1.22 09/06/2015   Lab Results  Component Value Date   TROPONINI 0.04* 09/07/2015    No results found for: CHOL No results found for: HDL No results found for: LDLCALC No results found for: TRIG No results found for: CHOLHDL No results found for: LDLDIRECT    Radiology:  Dg Chest Port 1 View  09/06/2015  CLINICAL DATA:  64 year old female with shortness of breath 1 week ago, and swelling in the feet and legs over the last month. EXAM: PORTABLE CHEST 1 VIEW COMPARISON:  No priors. FINDINGS: There is cephalization of the pulmonary vasculature and slight indistinctness of the interstitial markings suggestive of mild pulmonary edema. Mild  cardiomegaly. No definite pleural effusions. Upper mediastinal contours are within normal limits. IMPRESSION: 1. The appearance the chest suggests mild congestive heart failure, as above. Electronically Signed   By: Vinnie Langton M.D.   On: 09/06/2015 18:51    EKG:  Atrial fibrillation with RVR, anterior infarct and slight upsloping ST segment depression in the lateral precordial leads.  ASSESSMENT/PLAN: 1. Acute systolic CHF of ? Etiology.  She had a URI a few months back so this could be due to viral cardiomyopathy.  Also need to consider that afib may have been  initial event which led to a tachycardia mediated CM.  She does have risk factors for CAD including tobacco use and postmenopausal state so need to consider ischemia.  Continue IV Lasix as you are doing.  Stop Cardizem gtt due to LV dysfunction. Last dose of BB was last night. No BB at present due to presyncope (see below).  Add Lisinopril 5mg  daily for LV dysfunction.  2.  Atrial fibrillation with RVR - patient was on Cardizem gtt but stopped due to bradycardia with presyncope and ? Asystole.  CHADS2VASC score is 2 with adjusted stroke rate of 2.2% per year. Will continue IV Heparin for now until determined if patient needs PPM. Ultimately will place on NOAC.   BB has been stopped as well.    3.  Elevated D-Dimer - recommend Chest CT to rule out PE  4.  Dizziness with ? syncope - this was very brief while sitting on the commode.  BP at the time was 99991111 systolic And HR 99991111.  Telemetry showed no pauses at that time.  She had another episode this am where her face went blue and O2 sats dropped to the 70's.  Telemetry showed bradycardia and then reportedly HR in the 20's.  The telemetry was reviewed and HR definitely was low but then cannot tell if patient went asystolic for a few seconds or leads came off.  Hold on BB for now.    Would recommend transfer to [facility] so that she can be followed on tele and get EP consult.  [name], MD   09/07/2015  1:02 PM

## 2015-09-07 NOTE — Progress Notes (Signed)
Pharmacy - Heparin IV  Assessment:    Please see note from Dolly Rias, PharmD earlier today for full details.  Briefly, 64 y.o. female on heparin for atrial fibrillation while awaiting possible pacemaker placement.     Confirmatory heparin level stable in goal range on 1600 units/hr  No change in CBC; no new bleeding documented  CT negative for PE  CT head shows possible subacute infarct without hemorrhage.  Patient refusing MRI.  Plan:   Continue IV heparin infusion at 1600 units/hr  Will reduce heparin level goal range to 0.3 - 0.5 given possible subacute stroke (does not seem critical enough at this point to warrant stopping full-dose heparin altogether)  Daily heparin level/CBC  Reuel Boom, PharmD, BCPS Pager: [phone] 09/07/2015, 5:24 PM

## 2015-09-07 NOTE — Progress Notes (Signed)
[facility] PROGRESS NOTE  Amber Medina [id number] DOB: [birth year] DOA: 09/06/2015 PCP: No primary care provider on file.  brief narrative 64 year old obese female with no past medical history presented to [facility] ED with progressive shortness of breath for almost 2 months which has worsened for the past 2 weeks with associated edema offered trunk and lower legs. Informs having some dyspnea and occasional leg swelling noticed off and on since summer when she was standing and working long hours. She reports increased fatigue with dyspnea on minimal exertion for the past 1 week. Denies any fever, chills, chest pain, palpitations, dizziness, syncope, orthopnea or PND. Denies similar symptoms in the past or any family history of heart failure or sudden cardiac death. (Reports that her mother had some congenital heart disease). Patient has a long history of smoking (one pack per day but quit 5 months back), drinks occasionally and no illicit drug use. In the ED patient was found to have volume overload with new onset A. fib with rapid ventricular rate. Patient admitted to stepdown unit on IV Lasix and Cardizem drip. Overnight patient had episodes of asystole in the monitor and oxygen desaturation to the 70s this morning when her face became diaphoretic and below. Cardizem drip was discontinued. Patient was started on IV heparin on admission. Patient went to the bathroom early this morning and had a brief episode of syncope while sitting on the commode. Her vitals at that time was stable. Patient does not recall the incidents. Cardiology consulted for further management.  Assessment/Plan: Acute systolic CHF No clear etiology. Reports having URI symptoms 2 months back. Unclear if underlying A. fib contributing to her symptoms. Continue aggressive diuresis with IV Lasix (60 mg every 6 hours). No beta blocker or Cardizem due to asystole and syncope. Follow 2-D echo results. Monitor strict I/O and  daily weight. (Foley catheter placed this morning for close urine output monitoring). Patient started on IV heparin. -Further management per cardiology.  A. fib with RVR Started on Cardizem drip on admission but discontinued due to syncope and bradycardia with? Asystole. Telemetry does not show any pauses or NSVT. Electrolytes normal. (Magnesium of 1.8 which was replenished). Started on IV heparin on admission. Switch to Xarelto by cardiology today. CHADSvasc 2 score of 2.  Appreciate cardiology evaluation. Recommend transfer to [facility] stepdown for EP evaluation.  -syncope Unclear if this is vasovagal versus related to arrhythmia. Follow 2-D echo results. Carotid Doppler without significant stenosis. Patient also hypoxic. D-dimer mildly elevated. Will plan on CT angiogram of the chest to rule out PE. (Patient refused to have this done this morning but now agrees).  Morbid obesity  DVT prophylaxis: Xarelto Diet: Heart healthy  Code Status: Full code Family Communication: None at bedside. Patient lives alone  Disposition Plan: Spoke to [facility] stepdown unit.     Consultants:  Cardiology  Procedures:  2-D echo  CT angiogram of the chest  Antibiotics:  None  HPI/Subjective: Seen and examined. Admission H&P reviewed. Patient had episode of syncope this morning while in the bathroom and had episodes of asystole on the monitor with oxygen desaturation. Cardizem drip discontinued  Objective: Filed Vitals:   09/07/15 1100 09/07/15 1200  BP: 113/78 142/80  Pulse: 30 114  Temp:  98.4 F (36.9 C)  Resp: 15 19    Intake/Output Summary (Last 24 hours) at 09/07/15 1354 Last data filed at 09/07/15 1200  Gross per 24 hour  Intake 1054.43 ml  Output   1000 ml  Net  54.43 ml   Filed Weights   09/06/15 1813 09/07/15 0000 09/07/15 0615  Weight: 142.883 kg (315 lb) 162.1 kg (357 lb 5.9 oz) 161.2 kg (355 lb 6.1 oz)    Exam:   General: Elderly morbidly obese female  not in distress  HEENT: No pallor, moist oral mucosa, JVD+  Chest: Diminished bibasilar breath sounds due to body habitus, no added sounds  Cardiovascular: S1 and S2 irregularly irregular, no murmurs rubs or gallop  I: Soft, nondistended, nontender, pitting edema  Musculoskeletal: Warm, bilateral 2+ pitting edema  CNS: Alert and oriented  Data Reviewed: Basic Metabolic Panel:  Recent Labs Lab 09/06/15 1839 09/06/15 2011 09/07/15 0150 09/07/15 0655  NA 141  --  142 143  K 3.9  --  4.6 3.8  CL 107  --  107 107  CO2 22  --  28 26  GLUCOSE 133*  --  123* 122*  BUN 14  --  18 19  CREATININE 0.95  --  1.04* 1.18*  CALCIUM 9.0  --  8.9 8.9  MG  --  1.8  --   --   PHOS  --   --  4.2  --    Liver Function Tests:  Recent Labs Lab 09/07/15 0655  AST 34  ALT 34  ALKPHOS 57  BILITOT 1.5*  PROT 6.4*  ALBUMIN 3.5   No results for input(s): LIPASE, AMYLASE in the last 168 hours. No results for input(s): AMMONIA in the last 168 hours. CBC:  Recent Labs Lab 09/06/15 1839 09/07/15 0655  WBC 9.2 10.0  NEUTROABS 6.7 7.9*  HGB 15.2* 14.3  HCT 48.9* 45.2  MCV 92.8 91.3  PLT 259 227   Cardiac Enzymes:  Recent Labs Lab 09/07/15 0150 09/07/15 0655  TROPONINI 0.03 0.04*   BNP (last 3 results)  Recent Labs  09/06/15 1839  BNP 149.2*    ProBNP (last 3 results) No results for input(s): PROBNP in the last 8760 hours.  CBG:  Recent Labs Lab 09/07/15 0507  GLUCAP 188*    Recent Results (from the past 240 hour(s))  MRSA PCR Screening     Status: None   Collection Time: 09/07/15 12:12 AM  Result Value Ref Range Status   MRSA by PCR NEGATIVE NEGATIVE Final    Comment:        The GeneXpert MRSA Assay (FDA approved for NASAL specimens only), is one component of a comprehensive MRSA colonization surveillance program. It is not intended to diagnose MRSA infection nor to guide or monitor treatment for MRSA infections.      Studies: Dg Chest Port 1  View  09/06/2015  CLINICAL DATA:  64 year old female with shortness of breath 1 week ago, and swelling in the feet and legs over the last month. EXAM: PORTABLE CHEST 1 VIEW COMPARISON:  No priors. FINDINGS: There is cephalization of the pulmonary vasculature and slight indistinctness of the interstitial markings suggestive of mild pulmonary edema. Mild cardiomegaly. No definite pleural effusions. Upper mediastinal contours are within normal limits. IMPRESSION: 1. The appearance the chest suggests mild congestive heart failure, as above. Electronically Signed   By: Vinnie Langton M.D.   On: 09/06/2015 18:51    Scheduled Meds: . furosemide  60 mg Intravenous Q6H  . lisinopril  5 mg Oral Daily  . potassium chloride  40 mEq Oral BID  . rivaroxaban  20 mg Oral Q supper   Continuous Infusions:     Time spent: 35 minutes  Louellen Molder  [facility] Pager [phone] If 7PM-7AM, please contact night-coverage at [email or web address], password [facility] 09/07/2015, 1:54 PM  LOS: 1 day

## 2015-09-07 NOTE — Progress Notes (Signed)
Was the fall witnessed: No  Patient condition before and after the fall: Before fall pt walked to bathroom. Alert, oriented, and vital signs stable. After fall pt alert and oriented and vital signs stable. Pt visablly shaken, but joking with staff.  Patient's reaction to the fall: Pt visablly shaken by event but states she felt embarassed and denies pain.   Name of the doctor that was notified including date and time: Forrest Moron, NP notified 09/07/15 0535  Any interventions and vital signs: CBG 188.  Vitals as pt sat on floor: BP 168/119, SpO2 94% on room air, HR 111 atrial fib/flutter. Vitals after pt back in bed: BP 138/89, SpO2 93% on room air, HR 94 atrial fib/flutter. Head CT and Carotid US ordered. Pt refused head CT before and after education on why head CT was ordered. NP notified.

## 2015-09-07 NOTE — Care Management Note (Signed)
Case Management Note  Patient Details  Name: Mafalda Washa MRN: [id number] Date of Birth: [birth year]  Subjective/Objective:    chf with a.fib and rvr                Action/Plan:Date: September 07, 2015 Chart reviewed for concurrent status and case management needs. Will continue to follow patient for changes and needs:  [facility] for heart failure screening. Velva Harman, RN, BSN, [place]   [phone]   Expected Discharge Date:                  Expected Discharge Plan:  Home/Self Care  In-House Referral:  NA  Discharge planning Services  CM Consult  Post Acute Care Choice:    Choice offered to:     DME Arranged:    DME Agency:     HH Arranged:  Disease Management [place] Agency:  [place]  Status of Service:     Medicare Important Message Given:    Date Medicare IM Given:    Medicare IM give by:    Date Additional Medicare IM Given:    Additional Medicare Important Message give by:     If discussed at [place] of Stay Meetings, dates discussed:    Additional Comments:  Leeroy Cha, RN 09/07/2015, 10:38 AM

## 2015-09-07 NOTE — Progress Notes (Signed)
VASCULAR LAB PRELIMINARY  PRELIMINARY  PRELIMINARY  PRELIMINARY  Carotid duplex  completed.    Preliminary report:  Bilateral:  1-39% ICA stenosis.  Vertebral artery flow is antegrade.      Hawley Pavia, RVT 09/07/2015, 10:53 AM

## 2015-09-08 DIAGNOSIS — R001 Bradycardia, unspecified: Secondary | ICD-10-CM | POA: Insufficient documentation

## 2015-09-08 DIAGNOSIS — J9601 Acute respiratory failure with hypoxia: Secondary | ICD-10-CM | POA: Insufficient documentation

## 2015-09-08 LAB — HEMOGLOBIN A1C
Hgb A1c MFr Bld: 5.9 % — ABNORMAL HIGH (ref 4.8–5.6)
MEAN PLASMA GLUCOSE: 123 mg/dL

## 2015-09-08 LAB — CBC
HCT: 46.9 % — ABNORMAL HIGH (ref 36.0–46.0)
HEMOGLOBIN: 14.2 g/dL (ref 12.0–15.0)
MCH: 28.2 pg (ref 26.0–34.0)
MCHC: 30.3 g/dL (ref 30.0–36.0)
MCV: 93.1 fL (ref 78.0–100.0)
Platelets: 233 10*3/uL (ref 150–400)
RBC: 5.04 MIL/uL (ref 3.87–5.11)
RDW: 14.7 % (ref 11.5–15.5)
WBC: 9.4 10*3/uL (ref 4.0–10.5)

## 2015-09-08 LAB — BASIC METABOLIC PANEL
ANION GAP: 11 (ref 5–15)
BUN: 19 mg/dL (ref 6–20)
CALCIUM: 9.3 mg/dL (ref 8.9–10.3)
CO2: 26 mmol/L (ref 22–32)
Chloride: 104 mmol/L (ref 101–111)
Creatinine, Ser: 1.11 mg/dL — ABNORMAL HIGH (ref 0.44–1.00)
GFR calc Af Amer: 59 mL/min — ABNORMAL LOW (ref >60)
GFR, EST NON AFRICAN AMERICAN: 51 mL/min — AB (ref >60)
GLUCOSE: 135 mg/dL — AB (ref 65–99)
Potassium: 4.2 mmol/L (ref 3.5–5.1)
Sodium: 141 mmol/L (ref 135–145)

## 2015-09-08 LAB — HEPARIN LEVEL (UNFRACTIONATED)
HEPARIN UNFRACTIONATED: 0.24 [IU]/mL — AB (ref 0.30–0.70)
HEPARIN UNFRACTIONATED: 0.51 [IU]/mL (ref 0.30–0.70)

## 2015-09-08 MED ORDER — DIGOXIN 0.25 MG/ML IJ SOLN
0.2500 mg | Freq: Four times a day (QID) | INTRAMUSCULAR | Status: AC
Start: 2015-09-08 — End: 2015-09-09
  Administered 2015-09-08 – 2015-09-09 (×3): 0.25 mg via INTRAVENOUS
  Filled 2015-09-08 (×8): qty 1

## 2015-09-08 MED ORDER — FUROSEMIDE 10 MG/ML IJ SOLN
60.0000 mg | Freq: Three times a day (TID) | INTRAMUSCULAR | Status: DC
  Administered 2015-09-08 – 2015-09-12 (×11): 60 mg via INTRAVENOUS
  Filled 2015-09-08 (×12): qty 6

## 2015-09-08 MED ORDER — DILTIAZEM HCL 100 MG IV SOLR
5.0000 mg/h | INTRAVENOUS | Status: DC
  Administered 2015-09-08: 5 mg/h via INTRAVENOUS
  Administered 2015-09-08 – 2015-09-09 (×2): 7.5 mg/h via INTRAVENOUS
  Filled 2015-09-08 (×3): qty 100

## 2015-09-08 NOTE — Progress Notes (Signed)
ANTICOAGULATION CONSULT NOTE - Follow Up Consult  Pharmacy Consult for Heparin Indication: atrial fibrillation  Allergies  Allergen Reactions  . Compazine [Prochlorperazine Edisylate] Swelling    Patient Measurements: Height: 5\' 7"  (170.2 cm) Weight: (!) 348 lb 6.4 oz (158.033 kg) IBW/kg (Calculated) : 61.6 Heparin Dosing Weight: 101 kg  Vital Signs: Temp: 97.7 F (36.5 C) (12/03 1125) Temp Source: Oral (12/03 1125) BP: 114/78 mmHg (12/03 1125) Pulse Rate: 107 (12/03 1125)  Labs:  Recent Labs  09/06/15 1839 09/07/15 0150 09/07/15 0655  09/07/15 1604 09/08/15 0953 09/08/15 1901  HGB 15.2*  --  14.3  --   --  14.2  --   HCT 48.9*  --  45.2  --   --  46.9*  --   PLT 259  --  227  --   --  233  --   APTT 31  --   --   --   --   --   --   LABPROT 15.6*  --   --   --   --   --   --   INR 1.22  --   --   --   --   --   --   HEPARINUNFRC  --  0.28*  --   < > 0.40 0.24* 0.51  CREATININE 0.95 1.04* 1.18*  --   --  1.11*  --   TROPONINI  --  0.03 0.04*  --   --   --   --   < > = values in this interval not displayed.  Estimated Creatinine Clearance: 78.9 mL/min (by C-G formula based on Cr of 1.11).   Medications:  Scheduled:  . digoxin  0.25 mg Intravenous Q6H  . furosemide  60 mg Intravenous Q8H  . lisinopril  5 mg Oral Daily  . potassium chloride  40 mEq Oral BID   Infusions:  . diltiazem (CARDIZEM) infusion 7.5 mg/hr (09/08/15 1100)  . heparin 1,800 Units/hr (09/08/15 1204)    Assessment: 64 yo F started on heparin infusion for new onset afib with RVR, currently on Dilt gtt as well.  Heparin level is slightly high at 0.51 on 1800 units/hr.   Of note, pt did have CT scan which showed possible subacute infarct without hemorrhage and thus we are targeting a lower heparin level goal per pharmacy protocol.  Goal of Therapy:  Heparin level 0.3-0.5 units/ml Monitor platelets by anticoagulation protocol: Yes   Plan:  Continue heparin at 1800 units/hr (This level  should keep the patient near the higher end of the lower goal) Continue daily heparin level and CBC Follow up plans for oral anticoagulation  Levester Fresh, PharmD, BCPS, [facility] Clinical Pharmacist Pager [phone] 09/08/2015 8:08 PM

## 2015-09-08 NOTE — Progress Notes (Signed)
ANTICOAGULATION CONSULT NOTE - Follow Up Consult  Pharmacy Consult for Heparin Indication: atrial fibrillation  Allergies  Allergen Reactions  . Compazine [Prochlorperazine Edisylate] Swelling    Patient Measurements: Height: 5\' 7"  (170.2 cm) Weight: (!) 348 lb 6.4 oz (158.033 kg) IBW/kg (Calculated) : 61.6 Heparin Dosing Weight: 101 kg  Vital Signs: Temp: 98.6 F (37 C) (12/03 0755) Temp Source: Oral (12/03 0755) BP: 127/106 mmHg (12/03 0755) Pulse Rate: 94 (12/03 0755)  Labs:  Recent Labs  09/06/15 1839  09/07/15 0150 09/07/15 0655 09/07/15 0926 09/07/15 1604 09/08/15 0953  HGB 15.2*  --   --  14.3  --   --  14.2  HCT 48.9*  --   --  45.2  --   --  46.9*  PLT 259  --   --  227  --   --  233  APTT 31  --   --   --   --   --   --   LABPROT 15.6*  --   --   --   --   --   --   INR 1.22  --   --   --   --   --   --   HEPARINUNFRC  --   < > 0.28*  --  0.35 0.40 0.24*  CREATININE 0.95  --  1.04* 1.18*  --   --  1.11*  TROPONINI  --   --  0.03 0.04*  --   --   --   < > = values in this interval not displayed.  Estimated Creatinine Clearance: 78.9 mL/min (by C-G formula based on Cr of 1.11).   Medications:  Scheduled:  . digoxin  0.25 mg Intravenous Q6H  . furosemide  60 mg Intravenous Q6H  . lisinopril  5 mg Oral Daily  . potassium chloride  40 mEq Oral BID   Infusions:  . diltiazem (CARDIZEM) infusion 7.5 mg/hr (09/08/15 1100)  . heparin 1,600 Units/hr (09/08/15 0200)    Assessment: 64 yo F started on heparin infusion for new onset afib with RVR, currently on Dilt gtt as well.  Heparin level is subtherapeutic on 1600 units/hr.  Will adjust accordingly.  Of note, pt did have CT scan which showed possible subacute infarct without hemorrhage and thus we are targeting a lower heparin level goal per pharmacy protocol.  Goal of Therapy:  Heparin level 0.3-0.5 units/ml Monitor platelets by anticoagulation protocol: Yes   Plan:  Increase hepatin to 1800  units/hr Heparin level in 6 hours Continue daily heparin level and CBC Follow up plans for oral anticoagulation  [employer], Pharm.D., BCPS Clinical Pharmacist Pager [phone] 09/08/2015 12:03 PM

## 2015-09-08 NOTE — Progress Notes (Addendum)
PATIENT DETAILS Name: Amber Medina Age: 64 y.o. Sex: female Date of Birth: [birth year] Admit Date: 09/06/2015 Admitting Physician Reubin Milan, MD PCP:No primary care provider on file.  Brief narrative: 64 year old morbidly obese female presented to the hospital with worsening shortness of breath and lower extremity edema. In the emergency room, patient was found to have A. fib with RVR. Started on IV Cardizem, IV Lasix and admitted for further evaluation and treatment. Hospital course has been complicated by development of episodes of apnea and desaturation along with bradycardia/pauses.   Subjective: Feels much better. Still in A. fib RVR.  Assessment/Plan: Acute systolic heart failure along with right-sided heart failure: Much more compensated, decrease Lasix to 60 mg IV every 8 hours. Suspect left ventricular failure worsened by A. fib RVR, right-sided heart failure likely secondary to pulmonary hypertension/obesity. Cardiology following.  A. fib RVR: Heart rate in the 160s this morning-spoke with cardiology-Dr. Wynonia Lawman advised to restart Cardizem infusion and IV digoxin. Continue IV heparin-we will transition to oral anticoagulant in the next few days. CHADS2VASC score is 2.Defer EP consultation to cardiology. Follow.   History of bradycardia/sinus pauses with hypoxemia: Suspect secondary to OSA-PCCM consulted-CPAP QHS & PRN Sleep. Will need Assess for ambulatory O2 needs prior to discharge and outpatient sleep study after discharge  OSA/OSH:see above  Acute on chronic hypoxic respiratory failure: Suspect more for chronic issue secondary to above. Oxygen as needed, nocturnal O2 saturation recommended by PCCM  Morbid obesity: Counseled regarding importance of weight loss  Mild multinodular goiter, with a dominant 2.2 cm left thyroid lobe nodule: Incidental finding on CT chest. Thyroid ultrasound when more stable-suspect could be done as an outpatient   Low-density  Lesion in the left occipital lobe on CT head: CT head report this area is either subacute CVA or encephalomalacia, suspect the latter as no strokelike symptoms and complete nonfocal exam. MRI brain ordered for further delineation-however patient is claustrophobic and not keen on getting it. Carotid Doppler negative for stenosis, echocardiogram showed mild systolic dysfunction-is already on anticoagulation for atrial fibrillation. Suspect even if this is a small CVA-MRI brain will not change overall management. A1c 5.9, check LDL.  Disposition: Remain inpatient  Antimicrobial agents  See below  Anti-infectives    None      DVT Prophylaxis: IV Heparin   Code Status: Full code   Family Communication None at bedside  Procedures: None  CONSULTS:  cardiology and pulmonary/intensive care  Time spent 30 minutes-Greater than 50% of this time was spent in counseling, explanation of diagnosis, planning of further management, and coordination of care.  MEDICATIONS: Scheduled Meds: . furosemide  60 mg Intravenous Q6H  . lisinopril  5 mg Oral Daily  . potassium chloride  40 mEq Oral BID   Continuous Infusions: . heparin 1,600 Units/hr (09/08/15 0200)   PRN Meds:.ALPRAZolam, iohexol    PHYSICAL EXAM: Vital signs in last 24 hours: Filed Vitals:   09/07/15 2220 09/07/15 2324 09/08/15 0200 09/08/15 0432  BP: 112/91 112/87 114/82   Pulse: 113 132 116   Temp:  97.4 F (36.3 C)  98.5 F (36.9 C)  TempSrc:  Oral  Oral  Resp: 23 20 22    Height:      Weight:    158.033 kg (348 lb 6.4 oz)  SpO2: 96% 95% 96%     Weight change: 16.617 kg (36 lb 10.1 oz) Filed Weights   09/07/15 0615 09/07/15  2122 09/08/15 0432  Weight: 161.2 kg (355 lb 6.1 oz) 159.5 kg (351 lb 10.1 oz) 158.033 kg (348 lb 6.4 oz)   Body mass index is 54.55 kg/(m^2).   Gen Exam: Awake and alert with clear speech.   Neck: Supple, No JVD.   Chest: B/L Clear.   CVS: S1 S2 irregular-tachy, no murmurs.  Abdomen:  soft, BS +, non tender, non distended.  Extremities: + edema, lower extremities warm to touch. Neurologic: Non Focal.   Skin: No Rash.   Wounds: N/A.   Intake/Output from previous day:  Intake/Output Summary (Last 24 hours) at 09/08/15 0723 Last data filed at 09/08/15 0600  Gross per 24 hour  Intake   1142 ml  Output   5300 ml  Net  -4158 ml     LAB RESULTS: CBC  Recent Labs Lab 09/06/15 1839 09/07/15 0655  WBC 9.2 10.0  HGB 15.2* 14.3  HCT 48.9* 45.2  PLT 259 227  MCV 92.8 91.3  MCH 28.8 28.9  MCHC 31.1 31.6  RDW 14.5 14.5  LYMPHSABS 1.5 1.1  MONOABS 0.9 0.9  EOSABS 0.1 0.1  BASOSABS 0.0 0.0    Chemistries   Recent Labs Lab 09/06/15 1839 09/06/15 2011 09/07/15 0150 09/07/15 0655  NA 141  --  142 143  K 3.9  --  4.6 3.8  CL 107  --  107 107  CO2 22  --  28 26  GLUCOSE 133*  --  123* 122*  BUN 14  --  18 19  CREATININE 0.95  --  1.04* 1.18*  CALCIUM 9.0  --  8.9 8.9  MG  --  1.8  --   --     CBG:  Recent Labs Lab 09/07/15 0507  GLUCAP 188*    GFR Estimated Creatinine Clearance: 74.2 mL/min (by C-G formula based on Cr of 1.18).  Coagulation profile  Recent Labs Lab 09/06/15 1839  INR 1.22    Cardiac Enzymes  Recent Labs Lab 09/07/15 0150 09/07/15 0655  TROPONINI 0.03 0.04*    Invalid input(s): POCBNP  Recent Labs  09/07/15 0926  DDIMER 0.72*    Recent Labs  09/07/15 0150  HGBA1C 5.9*   No results for input(s): CHOL, HDL, LDLCALC, TRIG, CHOLHDL, LDLDIRECT in the last 72 hours.  Recent Labs  09/06/15 2011  TSH 0.436   No results for input(s): VITAMINB12, FOLATE, FERRITIN, TIBC, IRON, RETICCTPCT in the last 72 hours. No results for input(s): LIPASE, AMYLASE in the last 72 hours.  Urine Studies No results for input(s): UHGB, CRYS in the last 72 hours.  Invalid input(s): UACOL, UAPR, USPG, UPH, UTP, UGL, UKET, UBIL, UNIT, UROB, ULEU, UEPI, UWBC, URBC, UBAC, CAST, UCOM, BILUA  MICROBIOLOGY: Recent Results (from  the past 240 hour(s))  MRSA PCR Screening     Status: None   Collection Time: 09/07/15 12:12 AM  Result Value Ref Range Status   MRSA by PCR NEGATIVE NEGATIVE Final    Comment:        The GeneXpert MRSA Assay (FDA approved for NASAL specimens only), is one component of a comprehensive MRSA colonization surveillance program. It is not intended to diagnose MRSA infection nor to guide or monitor treatment for MRSA infections.     RADIOLOGY STUDIES/RESULTS: Ct Head Wo Contrast  09/07/2015  CLINICAL DATA:  64 year old with shortness of breath and syncopal episode. History of atrial fibrillation. Initial encounter. EXAM: CT HEAD WITHOUT CONTRAST TECHNIQUE: Contiguous axial images were obtained from the base of the skull  through the vertex without intravenous contrast. COMPARISON:  None. FINDINGS: There is no evidence of acute intracranial hemorrhage, mass lesion, brain edema or extra-axial fluid collection. The ventricles and subarachnoid spaces are appropriately sized for age. There is questionable low-density medially in the left occipital lobe on images 18 and 19 which could reflect encephalomalacia or a subacute infarct. There is minimal patchy periventricular white matter disease, likely due to chronic small vessel ischemic change. Intracranial atherosclerosis noted. Mild anterior left ethmoid sinus opacification. The visualized paranasal sinuses, mastoid air cells and middle ears are otherwise clear. The calvarium is intact. IMPRESSION: 1. Potential subacute subcortical infarct or encephalomalacia in the left occipital lobe. 2. No evidence of acute intracranial hemorrhage, midline shift or hydrocephalus. Electronically Signed   By: Richardean Sale M.D.   On: 09/07/2015 15:51   Ct Angio Chest Pe W/cm &/or Wo Cm  09/07/2015  CLINICAL DATA:  Shortness of breath. Atrial fibrillation. Syncopal episode. Dizziness. EXAM: CT ANGIOGRAPHY CHEST WITH CONTRAST TECHNIQUE: Multidetector CT imaging of the  chest was performed using the standard protocol during bolus administration of intravenous contrast. Multiplanar CT image reconstructions and MIPs were obtained to evaluate the vascular anatomy. CONTRAST:  154mL OMNIPAQUE IOHEXOL 300 MG/ML  SOLN COMPARISON:  Chest radiograph from earlier today. FINDINGS: Mediastinum/Nodes: The study is moderate quality for the evaluation of pulmonary embolism, with evaluation of the subsegmental pulmonary arteries limited by motion artifact. There are no filling defects in the central, lobar or segmental pulmonary artery branches to suggest acute pulmonary embolism. Great vessels are normal in course and caliber. Mild cardiomegaly. No pericardial fluid/thickening. Left anterior descending coronary atherosclerosis. There is a mild multinodular goiter asymmetrically involving the left thyroid lobe, including a partially calcified 1.5 cm posterior upper left thyroid lobe nodule and a hypodense 2.2 cm lower left thyroid lobe nodule. Normal esophagus. No axillary adenopathy. There are a few top-normal size right paratracheal nodes and right hilar nodes containing coarse internal calcifications, in keeping with prior granulomatous disease. Otherwise no mediastinal or hilar adenopathy. Lungs/Pleura: No pneumothorax. Trace layering right pleural effusion. No left pleural effusion. Coarsely calcified subcentimeter granuloma in the anterior right upper lobe. No acute consolidative airspace disease, significant pulmonary nodules or lung masses. There is mild passive atelectasis in the dependent right lower lobe and lingula. There is a faint mosaic attenuation throughout both lungs with scattered mild interlobular septal thickening, most suggestive of mild pulmonary edema. Upper abdomen: Unremarkable. Musculoskeletal: No aggressive appearing focal osseous lesions. Mild degenerative changes in the thoracic spine. Review of the MIP images confirms the above findings. IMPRESSION: 1. No evidence of  pulmonary embolism. 2. Mild cardiomegaly. Trace right pleural effusion. Mild pulmonary edema. Findings are most in keeping with mild congestive heart failure. 3. Mild multinodular goiter, with a dominant 2.2 cm left thyroid lobe nodule. Recommend short-term outpatient correlation with thyroid ultrasound. This follows ACR consensus guidelines: Managing Incidental Thyroid Nodules Detected on Imaging: White Paper of the ACR Incidental Thyroid Findings Committee. J Am Coll Radiol 2015; 12:143-150. Electronically Signed   By: Ilona Sorrel M.D.   On: 09/07/2015 15:51   Dg Chest Port 1 View  09/06/2015  CLINICAL DATA:  64 year old female with shortness of breath 1 week ago, and swelling in the feet and legs over the last month. EXAM: PORTABLE CHEST 1 VIEW COMPARISON:  No priors. FINDINGS: There is cephalization of the pulmonary vasculature and slight indistinctness of the interstitial markings suggestive of mild pulmonary edema. Mild cardiomegaly. No definite pleural effusions. Upper mediastinal contours  are within normal limits. IMPRESSION: 1. The appearance the chest suggests mild congestive heart failure, as above. Electronically Signed   By: Vinnie Langton M.D.   On: 09/06/2015 18:51    Oren Binet, MD  [facility] Pager:[phone]  If 7PM-7AM, please contact night-coverage [email or web address] Password [email or web address] 09/08/2015, 7:23 AM   LOS: 2 days

## 2015-09-08 NOTE — Consult Note (Signed)
Name: Amber Medina MRN: [id number] DOB: 1950-12-24    ADMISSION DATE:  09/06/2015 CONSULTATION DATE:  09/08/15  REFERRING MD :  Dr. Sloan Leiter   CHIEF COMPLAINT:  Bradycardia / Hypoxemia, concern for sleep apnea   BRIEF PATIENT DESCRIPTION: 64 y/o F, former smoker, with PMH of obesity & HTN (not on meds prior to current admit) who was admitted 12/1 for increasing SOB.  The patient was later noted to have periods of apnea, bradycardia (once on toilet, another while sleeping), & hypoxemia.  She also was noted to have AF with RVR and CXR with mild CHF.  PCCM consulted for evaluation of hypoxemia / concern for sleep apnea.    SIGNIFICANT EVENTS  12/01  Admit for increasing SOB 12/03  PCCM consulted for hypoxemia & concern for OSA  STUDIES:  12/02  Carotid Doppler >> vertebral arteries appear patent, 39% stenosis involving the R ICA and the L ICA 12/02  ECHO >> R heart enlargement, reduced RV systolic function, PA peak 36 12/02  CT Head >> potential subacute subcortical infarct or encephalomalacia in the L occipital lobe, no acute findings 12/02  CTA Chest >> no PE, mild cardiomegaly, trace R effusion, mild pulmonary edema, mild multinodular goiter   HISTORY OF PRESENT ILLNESS:  64 y/o F, former smoker (quit July 2016, smoked since age 48, smoked intermittently, up to 1ppd) with PMH of HTN (not on meds prior to admit) who presented to [facility] 12/1 for increased SOB and lower extremity swelling.    The patient reports prior to August, she was in her usual state of health.  No difficulties with activity.  She splits her time between [place] and [place].   In August she was treated for sinusitis in [place], Rx'd with antibiotics.  She initially felt better.  In September she went to to a conference and caught a respiratory "bug" and improved over 5-6 days.  Later in September, she began feeling bad again noting increased SOB in scenarios where she wouldn't usually get short of breath.  It became progressively worse  to the point that at Thanksgiving when she visited her daughter and she couldn't walk 10 feet without stopping for rest.   On admit, the patient presented for increased SOB.  She states she couldn't walk 5 feet without rest.  She reports she couldn't speak a complete sentence.  She recently had driven from [place] to [facility] and noted she was nodding off while driving.  Increased lower extremity swelling and swelling up to the abdomen (to the point she couldn't put her finger in her belly button) that began approximately 3 weeks prior to admit.    Daughter reports the patient snores when sleeping.  She also has noted periods of apnea.  The patient reports she will wake herself up at night with jerking awake.     On admission, the patient was noted to have AF with RVR, anasarca, & hyperglycemia.  She was treated with cardizem and diuresis.  Cardiology was consulted for evaluation.  She was later noted to have periods of bradycardia and hypoxemia.  Bradycardic episodes occurred once on the toilet and once at night.  The patient reports hypoxia episodes occurred after activity or after waking from sleep.  CT of the head was negative for acute hemorrhage but did show a questionable subacute subcortical infarct or encephalomalacia in the L occipital lobe.  CTA of the chest was negative for PE, showed mild cardiomegaly, trace R effusion, mild pulmonary edema, mild multinodular goiter.  Carotid dopplers with 39% stenosis of bilateral ICA's.  ECHO with right enlargement, reduced RV systolic function and PA peak of 36.      PAST MEDICAL HISTORY :   has no past medical history on file.  has past surgical history that includes Tonsillectomy.   Prior to Admission medications   Medication Sig Start Date End Date Taking? Authorizing Provider  Ascorbic Acid (VITAMIN C) 1000 MG tablet Take 2,000 mg by mouth daily.   Yes Historical Provider, MD  b complex vitamins tablet Take 1 tablet by mouth daily.   Yes Historical  Provider, MD  Cholecalciferol (VITAMIN D PO) Take by mouth.   Yes Historical Provider, MD  diphenhydrAMINE (BENADRYL) 25 mg capsule Take 25 mg by mouth every 6 (six) hours as needed for itching or allergies.   Yes Historical Provider, MD  Hyaluronic Acid-Vitamin C (HYALURONIC ACID PO) Take by mouth.   Yes Historical Provider, MD  Multiple Vitamin (MULTIVITAMIN WITH MINERALS) TABS tablet Take 1 tablet by mouth daily.   Yes Historical Provider, MD  OVER THE COUNTER MEDICATION as directed. Allergy eye drops.   Yes Historical Provider, MD   Allergies  Allergen Reactions  . Compazine [Prochlorperazine Edisylate] Swelling    FAMILY HISTORY:  family history includes Congenital heart disease in her mother; Diabetes Mellitus II in her mother; Hypertension in her father and mother.   SOCIAL HISTORY:  reports that she has quit smoking. She does not have any smokeless tobacco history on file. She reports that she drinks alcohol. She reports that she does not use illicit drugs.  REVIEW OF SYSTEMS:   Constitutional: Negative for fever, chills, weight loss, malaise/fatigue and diaphoresis.  HENT: Negative for hearing loss, ear pain, nosebleeds, congestion, sore throat, neck pain, tinnitus and ear discharge.   Eyes: Negative for blurred vision, double vision, photophobia, pain, discharge and redness.  Respiratory: Negative for cough, hemoptysis, sputum production, wheezing and stridor.  Reports shortness of breath.   Cardiovascular: Negative for chest pain, palpitations, orthopnea, claudication, and PND. Reports increased LE swelling up to the abdomen Gastrointestinal: Negative for heartburn, nausea, vomiting, abdominal pain, diarrhea, constipation, blood in stool and melena.  Genitourinary: Negative for dysuria, urgency, frequency, hematuria and flank pain.  Musculoskeletal: Negative for myalgias, back pain, joint pain and falls.  Skin: Negative for itching and rash.  Neurological: Negative for  dizziness, tingling, tremors, sensory change, speech change, focal weakness, seizures, loss of consciousness, weakness and headaches.  Endo/Heme/Allergies: Negative for environmental allergies and polydipsia. Does not bruise/bleed easily.  SUBJECTIVE:   VITAL SIGNS: Temp:  [97.4 F (36.3 C)-98.6 F (37 C)] 98.6 F (37 C) (12/03 0755) Pulse Rate:  [28-134] 94 (12/03 0755) Resp:  [14-32] 32 (12/03 0755) BP: (111-157)/(60-108) 127/106 mmHg (12/03 0755) SpO2:  [87 %-98 %] 97 % (12/03 0755) Weight:  [348 lb 6.4 oz (158.033 kg)-351 lb 10.1 oz (159.5 kg)] 348 lb 6.4 oz (158.033 kg) (12/03 0432)  PHYSICAL EXAMINATION: General:  Morbidly obese female in NAD  Neuro:  AAOx4, speech clear, pleasant, MAE, normal strength HEENT:  MM pink/moist, unable to assess JVD, MM pink/moist, good dentition  Cardiovascular:  s1s2 irr irr, no m/r/g, AF on monitor Lungs:  Even/non-labored, lungs bilaterally distant but clear  Abdomen:  Obese, soft, bsx4 active  Musculoskeletal:  No acute deformities  Skin:  Trace LE edema in BLE   Recent Labs Lab 09/06/15 1839 09/07/15 0150 09/07/15 0655  NA 141 142 143  K 3.9 4.6 3.8  CL 107 107 107  CO2 22 28 26   BUN 14 18 19   CREATININE 0.95 1.04* 1.18*  GLUCOSE 133* 123* 122*    Recent Labs Lab 09/06/15 1839 09/07/15 0655  HGB 15.2* 14.3  HCT 48.9* 45.2  WBC 9.2 10.0  PLT 259 227   Ct Head Wo Contrast  09/07/2015  CLINICAL DATA:  64 year old with shortness of breath and syncopal episode. History of atrial fibrillation. Initial encounter. EXAM: CT HEAD WITHOUT CONTRAST TECHNIQUE: Contiguous axial images were obtained from the base of the skull through the vertex without intravenous contrast. COMPARISON:  None. FINDINGS: There is no evidence of acute intracranial hemorrhage, mass lesion, brain edema or extra-axial fluid collection. The ventricles and subarachnoid spaces are appropriately sized for age. There is questionable low-density medially in the left  occipital lobe on images 18 and 19 which could reflect encephalomalacia or a subacute infarct. There is minimal patchy periventricular white matter disease, likely due to chronic small vessel ischemic change. Intracranial atherosclerosis noted. Mild anterior left ethmoid sinus opacification. The visualized paranasal sinuses, mastoid air cells and middle ears are otherwise clear. The calvarium is intact. IMPRESSION: 1. Potential subacute subcortical infarct or encephalomalacia in the left occipital lobe. 2. No evidence of acute intracranial hemorrhage, midline shift or hydrocephalus. Electronically Signed   By: Richardean Sale M.D.   On: 09/07/2015 15:51   Ct Angio Chest Pe W/cm &/or Wo Cm  09/07/2015  CLINICAL DATA:  Shortness of breath. Atrial fibrillation. Syncopal episode. Dizziness. EXAM: CT ANGIOGRAPHY CHEST WITH CONTRAST TECHNIQUE: Multidetector CT imaging of the chest was performed using the standard protocol during bolus administration of intravenous contrast. Multiplanar CT image reconstructions and MIPs were obtained to evaluate the vascular anatomy. CONTRAST:  1104mL OMNIPAQUE IOHEXOL 300 MG/ML  SOLN COMPARISON:  Chest radiograph from earlier today. FINDINGS: Mediastinum/Nodes: The study is moderate quality for the evaluation of pulmonary embolism, with evaluation of the subsegmental pulmonary arteries limited by motion artifact. There are no filling defects in the central, lobar or segmental pulmonary artery branches to suggest acute pulmonary embolism. Great vessels are normal in course and caliber. Mild cardiomegaly. No pericardial fluid/thickening. Left anterior descending coronary atherosclerosis. There is a mild multinodular goiter asymmetrically involving the left thyroid lobe, including a partially calcified 1.5 cm posterior upper left thyroid lobe nodule and a hypodense 2.2 cm lower left thyroid lobe nodule. Normal esophagus. No axillary adenopathy. There are a few top-normal size right  paratracheal nodes and right hilar nodes containing coarse internal calcifications, in keeping with prior granulomatous disease. Otherwise no mediastinal or hilar adenopathy. Lungs/Pleura: No pneumothorax. Trace layering right pleural effusion. No left pleural effusion. Coarsely calcified subcentimeter granuloma in the anterior right upper lobe. No acute consolidative airspace disease, significant pulmonary nodules or lung masses. There is mild passive atelectasis in the dependent right lower lobe and lingula. There is a faint mosaic attenuation throughout both lungs with scattered mild interlobular septal thickening, most suggestive of mild pulmonary edema. Upper abdomen: Unremarkable. Musculoskeletal: No aggressive appearing focal osseous lesions. Mild degenerative changes in the thoracic spine. Review of the MIP images confirms the above findings. IMPRESSION: 1. No evidence of pulmonary embolism. 2. Mild cardiomegaly. Trace right pleural effusion. Mild pulmonary edema. Findings are most in keeping with mild congestive heart failure. 3. Mild multinodular goiter, with a dominant 2.2 cm left thyroid lobe nodule. Recommend short-term outpatient correlation with thyroid ultrasound. This follows ACR consensus guidelines: Managing Incidental Thyroid Nodules Detected on Imaging: White Paper of the ACR Incidental Thyroid Findings Committee. J Am Coll  Radiol 2015; 12:143-150. Electronically Signed   By: Ilona Sorrel M.D.   On: 09/07/2015 15:51   Dg Chest Port 1 View  09/06/2015  CLINICAL DATA:  64 year old female with shortness of breath 1 week ago, and swelling in the feet and legs over the last month. EXAM: PORTABLE CHEST 1 VIEW COMPARISON:  No priors. FINDINGS: There is cephalization of the pulmonary vasculature and slight indistinctness of the interstitial markings suggestive of mild pulmonary edema. Mild cardiomegaly. No definite pleural effusions. Upper mediastinal contours are within normal limits. IMPRESSION: 1.  The appearance the chest suggests mild congestive heart failure, as above. Electronically Signed   By: Vinnie Langton M.D.   On: 09/06/2015 18:51    ASSESSMENT / PLAN:  Dyspnea - suspect multifactorial in the setting of morbid obesity, atrial fibrillation with RVR, PAH PAH - noted on ECHO 12/2 OSA / OHS - suspected OSA / OHS, no formal sleep study.  Epworth Sleepiness Scale of 12  Plan:  Oxygen as needed to support saturations > 92% Assess for ambulatory O2 needs prior to discharge  CPAP QHS & PRN Sleep  Will need outpatient sleep study after discharge  Assess overnight oximetry    Atrial Fibrillation with RVR  Hypertension Bradycardia - resolved.  Anasarca   Plan: Cardizem gtt  Digoxin  Lasix 60 mg IV Q6 Lisinopril  Cardiology following  Heparin gtt for AF   Hypokalemia   Plan: Replace electrolytes as indicated Trend BMP / UOP    Abnormal CT Head - pt has refused MRI at this point  Plan: Further follow up per primary SVC   Noe Gens, NP-C  Pulmonary & Critical Care Pgr: [phone] or if no answer [phone] 09/08/2015, 10:29 AM

## 2015-09-08 NOTE — Progress Notes (Signed)
Subjective:  Says that she feels much better today.  Currently sitting up at bedside with atrial fibrillation and rapid ventricular response at the present time with rates in size 170.  One episode of bradycardia occurred when she was on the toilet and another one occurred during sleep.  Question has been raised of sleep apnea.  Currently diuresing well   Objective:  Vital Signs in the last 24 hours: BP 127/106 mmHg  Pulse 94  Temp(Src) 98.6 F (37 C) (Oral)  Resp 32  Ht 5\' 7"  (1.702 m)  Wt 158.033 kg (348 lb 6.4 oz)  BMI 54.55 kg/m2  SpO2 97%  Physical Exam: Severely obese female in no acute distress sitting up at side of bed  Lungs:  Clear Cardiac:   rapid irregular rhythm, normal S1 and S2, no S3 Extremities:  No edema present  Intake/Output from previous day: 12/02 0701 - 12/03 0700 In: [id number] [P.O.:710; I.V.:432] Out: 5300 [Urine:5300]  Weight Filed Weights   09/07/15 0615 09/07/15 2122 09/08/15 0432  Weight: 161.2 kg (355 lb 6.1 oz) 159.5 kg (351 lb 10.1 oz) 158.033 kg (348 lb 6.4 oz)    Lab Results: Basic Metabolic Panel:  Recent Labs  09/07/15 0150 09/07/15 0655  NA 142 143  K 4.6 3.8  CL 107 107  CO2 28 26  GLUCOSE 123* 122*  BUN 18 19  CREATININE 1.04* 1.18*   CBC:  Recent Labs  09/06/15 1839 09/07/15 0655  WBC 9.2 10.0  NEUTROABS 6.7 7.9*  HGB 15.2* 14.3  HCT 48.9* 45.2  MCV 92.8 91.3  PLT 259 227   Cardiac Enzymes: Troponin (Point of Care Test)  Recent Labs  09/06/15 1846  TROPIPOC 0.00   Cardiac Panel (last 3 results)  Recent Labs  09/07/15 0150 09/07/15 0655  TROPONINI 0.03 0.04*    Telemetry:  atrial fibrillation with rapid response   Assessment/Plan:   1.  Anasarca getting better with mild reduction in left ventricular systolic function and dilated RV 2.  Atrial fibrillation with rapid ventricular response 3.  History of bradycardia in the setting of reduced oxygen saturations 4.  Severe morbid  obesity  Recommendations:  Very rapid rate at this time.  I would initiate intravenous diltiazem and also go ahead and place on some Lanoxin to help with rate control.  Discussed with hospitalist.  Continue diuresis.     Kerry Hough  MD [facility]  09/08/2015, 9:20 AM

## 2015-09-09 ENCOUNTER — Encounter (HOSPITAL_COMMUNITY): Payer: Self-pay | Admitting: Internal Medicine

## 2015-09-09 LAB — CBC
HCT: 47 % — ABNORMAL HIGH (ref 36.0–46.0)
HEMOGLOBIN: 14.4 g/dL (ref 12.0–15.0)
MCH: 28.4 pg (ref 26.0–34.0)
MCHC: 30.6 g/dL (ref 30.0–36.0)
MCV: 92.7 fL (ref 78.0–100.0)
Platelets: 225 10*3/uL (ref 150–400)
RBC: 5.07 MIL/uL (ref 3.87–5.11)
RDW: 14.4 % (ref 11.5–15.5)
WBC: 9 10*3/uL (ref 4.0–10.5)

## 2015-09-09 LAB — LIPID PANEL
CHOL/HDL RATIO: 4.7 ratio
Cholesterol: 155 mg/dL (ref 0–200)
HDL: 33 mg/dL — AB (ref >40)
LDL CALC: 99 mg/dL (ref 0–99)
Triglycerides: 116 mg/dL (ref <150)
VLDL: 23 mg/dL (ref 0–40)

## 2015-09-09 LAB — BASIC METABOLIC PANEL
ANION GAP: 12 (ref 5–15)
BUN: 17 mg/dL (ref 6–20)
CHLORIDE: 98 mmol/L — AB (ref 101–111)
CO2: 34 mmol/L — AB (ref 22–32)
Calcium: 9.6 mg/dL (ref 8.9–10.3)
Creatinine, Ser: 1.12 mg/dL — ABNORMAL HIGH (ref 0.44–1.00)
GFR calc non Af Amer: 50 mL/min — ABNORMAL LOW (ref >60)
GFR, EST AFRICAN AMERICAN: 58 mL/min — AB (ref >60)
Glucose, Bld: 94 mg/dL (ref 65–99)
Potassium: 4.4 mmol/L (ref 3.5–5.1)
Sodium: 144 mmol/L (ref 135–145)

## 2015-09-09 LAB — HEPARIN LEVEL (UNFRACTIONATED): Heparin Unfractionated: 0.4 IU/mL (ref 0.30–0.70)

## 2015-09-09 MED ORDER — DILTIAZEM HCL 60 MG PO TABS
60.0000 mg | ORAL_TABLET | Freq: Four times a day (QID) | ORAL | Status: DC
  Administered 2015-09-09 – 2015-09-10 (×3): 60 mg via ORAL
  Filled 2015-09-09 (×3): qty 1

## 2015-09-09 MED ORDER — RIVAROXABAN 20 MG PO TABS
20.0000 mg | ORAL_TABLET | Freq: Every day | ORAL | Status: DC
  Administered 2015-09-09 – 2015-09-14 (×6): 20 mg via ORAL
  Filled 2015-09-09 (×6): qty 1

## 2015-09-09 NOTE — Progress Notes (Signed)
Subjective:  Placed on intravenous digoxin yesterday with better rate control today.  Has not had severe bradycardia.  Seen by pulmonary yesterday.  Had one episode of what looks like 21 run beats of VT earlier this morning and a rapid rate.  Continues to diurese well and weight is down further.  Objective:  Vital Signs in the last 24 hours: BP 95/68 mmHg  Pulse 79  Temp(Src) 98.5 F (36.9 C) (Oral)  Resp 16  Ht 5\' 7"  (1.702 m)  Wt 153.5 kg (338 lb 6.5 oz)  BMI 52.99 kg/m2  SpO2 94%  Physical Exam: Severely obese female in no acute distress sitting up at side of bed  Lungs:  Clear Cardiac:   rapid irregular rhythm, normal S1 and S2, no S3 Extremities:  No edema present  Intake/Output from previous day: 12/03 0701 - 12/04 0700 In: 1425.8 [P.O.:720; I.V.:705.8] Out: 8700 [Urine:8700]  Weight Filed Weights   09/07/15 2122 09/08/15 0432 09/09/15 0436  Weight: 159.5 kg (351 lb 10.1 oz) 158.033 kg (348 lb 6.4 oz) 153.5 kg (338 lb 6.5 oz)    Lab Results: Basic Metabolic Panel:  Recent Labs  09/08/15 0953 09/09/15 0414  NA 141 144  K 4.2 4.4  CL 104 98*  CO2 26 34*  GLUCOSE 135* 94  BUN 19 17  CREATININE 1.11* 1.12*   CBC:  Recent Labs  09/06/15 1839 09/07/15 0655 09/08/15 0953 09/09/15 0414  WBC 9.2 10.0 9.4 9.0  NEUTROABS 6.7 7.9*  --   --   HGB 15.2* 14.3 14.2 14.4  HCT 48.9* 45.2 46.9* 47.0*  MCV 92.8 91.3 93.1 92.7  PLT 259 227 233 225   Cardiac Enzymes: Troponin (Point of Care Test)  Recent Labs  09/06/15 1846  TROPIPOC 0.00   Cardiac Panel (last 3 results)  Recent Labs  09/07/15 0150 09/07/15 0655  TROPONINI 0.03 0.04*    Telemetry:  atrial fibrillation with rapid response   Assessment/Plan:   1.  Anasarca getting better with mild reduction in left ventricular systolic function and dilated RV-continues diuresis 2.  Atrial fibrillation with rapid ventricular response-controlled 3.  History of bradycardia in the setting of reduced  oxygen saturations 4.  Severe morbid obesity  Recommendations:  Continue intravenous diltiazem and heparin at the present time.  May place on low-dose of digoxin pending EP consult on an oral basis to help with rate control.  Will ask for EP to formally consult on patient today to comment on bradycardia.  Suspect a lot of this is due to sleep apnea and decreasing oxygen saturations and setting of severe morbid obesity.   Kerry Hough  MD [facility]  09/09/2015, 8:10 AM

## 2015-09-09 NOTE — Progress Notes (Signed)
PATIENT DETAILS Name: [name] Age: 64 y.o. Sex: female Date of Birth: [birth year] Admit Date: 09/06/2015 Admitting Physician Reubin Milan, MD PCP:No primary care provider on file.  Brief narrative: 64 year old morbidly obese female presented to the hospital with worsening shortness of breath and lower extremity edema. In the emergency room, patient was found to have A. fib with RVR. Started on IV Cardizem, IV Lasix and admitted for further evaluation and treatment. Hospital course has been complicated by development of episodes of apnea and desaturation along with bradycardia/pauses.   Subjective: Feels much better. Still in A. fib RVR-but rate much better than yesterday.  Assessment/Plan: Acute systolic heart failure along with right-sided heart failure: Much more compensated, continue Lasix to 60 mg IV every 8 hours. Weight down to 338 pounds (357 pounds on admission), negative balance of 12.6 L. Suspect left ventricular failure worsened by A. fib RVR, right-sided heart failure likely secondary to pulmonary hypertension/obesity. Cardiology following.  A. fib RVR: Although heart rate better, still in RVR, continue IV Cardizem and IV heparin, await EP input.CHADS2VASC score is 2 Follow.   History of bradycardia/sinus pauses with hypoxemia: Suspect secondary to OSA.EP consulted. PCCM consulted-CPAP QHS & PRN Sleep. Will need Assess for ambulatory O2 needs prior to discharge and outpatient sleep study after discharge  OSA/OSH:see above  Acute on chronic hypoxic respiratory failure: Suspect more for chronic issue secondary to above. Oxygen as needed, nocturnal O2 saturation recommended by PCCM  Morbid obesity: Counseled regarding importance of weight loss  Mild multinodular goiter, with a dominant 2.2 cm left thyroid lobe nodule: Incidental finding on CT chest. Thyroid ultrasound when more stable-suspect could be done as an outpatient   Low-density Lesion in the  left occipital lobe on CT head: CT head report this area is either subacute CVA or encephalomalacia, suspect the latter as no strokelike symptoms and complete nonfocal exam. MRI brain ordered for further delineation-however patient is claustrophobic and not keen on getting it. Carotid Doppler negative for stenosis, echocardiogram showed mild systolic dysfunction-is already on anticoagulation for atrial fibrillation. Suspect even if this is a small CVA-MRI brain will not change overall management. A1c 5.9, check LDL.  Disposition: Remain inpatient  Antimicrobial agents  See below  Anti-infectives    None      DVT Prophylaxis: IV Heparin   Code Status: Full code   Family Communication None at bedside  Procedures: None  CONSULTS:  cardiology and pulmonary/intensive care  Time spent 25 minutes-Greater than 50% of this time was spent in counseling, explanation of diagnosis, planning of further management, and coordination of care.  MEDICATIONS: Scheduled Meds: . furosemide  60 mg Intravenous Q8H  . lisinopril  5 mg Oral Daily  . potassium chloride  40 mEq Oral BID   Continuous Infusions: . diltiazem (CARDIZEM) infusion 7.5 mg/hr (09/09/15 1108)  . heparin 1,800 Units/hr (09/09/15 1108)   PRN Meds:.ALPRAZolam, iohexol    PHYSICAL EXAM: Vital signs in last 24 hours: Filed Vitals:   09/08/15 2331 09/09/15 0331 09/09/15 0436 09/09/15 0700  BP: 126/66 100/80 114/87 95/68  Pulse: 39 89 85 79  Temp:   98.5 F (36.9 C)   TempSrc:   Oral   Resp: 18 20 18 16   Height:      Weight:   153.5 kg (338 lb 6.5 oz)   SpO2: 92% 98% 91% 94%    Weight change: -6 kg (-13 lb 3.6 oz)  Filed Weights   09/07/15 2122 09/08/15 0432 09/09/15 0436  Weight: 159.5 kg (351 lb 10.1 oz) 158.033 kg (348 lb 6.4 oz) 153.5 kg (338 lb 6.5 oz)   Body mass index is 52.99 kg/(m^2).   Gen Exam: Awake and alert with clear speech.   Neck: Supple, No JVD.   Chest: B/L Clear.   CVS: S1 S2  irregular-tachy, no murmurs.  Abdomen: soft, BS +, non tender, non distended.  Extremities: + edema, lower extremities warm to touch. Neurologic: Non Focal.   Skin: No Rash.   Wounds: N/A.   Intake/Output from previous day:  Intake/Output Summary (Last 24 hours) at 09/09/15 1259 Last data filed at 09/09/15 1000  Gross per 24 hour  Intake 1185.81 ml  Output   6950 ml  Net -5764.19 ml     LAB RESULTS: CBC  Recent Labs Lab 09/06/15 1839 09/07/15 0655 09/08/15 0953 09/09/15 0414  WBC 9.2 10.0 9.4 9.0  HGB 15.2* 14.3 14.2 14.4  HCT 48.9* 45.2 46.9* 47.0*  PLT 259 227 233 225  MCV 92.8 91.3 93.1 92.7  MCH 28.8 28.9 28.2 28.4  MCHC 31.1 31.6 30.3 30.6  RDW 14.5 14.5 14.7 14.4  LYMPHSABS 1.5 1.1  --   --   MONOABS 0.9 0.9  --   --   EOSABS 0.1 0.1  --   --   BASOSABS 0.0 0.0  --   --     Chemistries   Recent Labs Lab 09/06/15 1839 09/06/15 2011 09/07/15 0150 09/07/15 0655 09/08/15 0953 09/09/15 0414  NA 141  --  142 143 141 144  K 3.9  --  4.6 3.8 4.2 4.4  CL 107  --  107 107 104 98*  CO2 22  --  28 26 26  34*  GLUCOSE 133*  --  123* 122* 135* 94  BUN 14  --  18 19 19 17   CREATININE 0.95  --  1.04* 1.18* 1.11* 1.12*  CALCIUM 9.0  --  8.9 8.9 9.3 9.6  MG  --  1.8  --   --   --   --     CBG:  Recent Labs Lab 09/07/15 0507  GLUCAP 188*    GFR Estimated Creatinine Clearance: 76.8 mL/min (by C-G formula based on Cr of 1.12).  Coagulation profile  Recent Labs Lab 09/06/15 1839  INR 1.22    Cardiac Enzymes  Recent Labs Lab 09/07/15 0150 09/07/15 0655  TROPONINI 0.03 0.04*    Invalid input(s): POCBNP  Recent Labs  09/07/15 0926  DDIMER 0.72*    Recent Labs  09/07/15 0150  HGBA1C 5.9*    Recent Labs  09/09/15 0414  CHOL 155  HDL 33*  LDLCALC 99  TRIG 116  CHOLHDL 4.7    Recent Labs  09/06/15 2011  TSH 0.436   No results for input(s): VITAMINB12, FOLATE, FERRITIN, TIBC, IRON, RETICCTPCT in the last 72 hours. No  results for input(s): LIPASE, AMYLASE in the last 72 hours.  Urine Studies No results for input(s): UHGB, CRYS in the last 72 hours.  Invalid input(s): UACOL, UAPR, USPG, UPH, UTP, UGL, UKET, UBIL, UNIT, UROB, ULEU, UEPI, UWBC, URBC, UBAC, CAST, UCOM, BILUA  MICROBIOLOGY: Recent Results (from the past 240 hour(s))  MRSA PCR Screening     Status: None   Collection Time: 09/07/15 12:12 AM  Result Value Ref Range Status   MRSA by PCR NEGATIVE NEGATIVE Final    Comment:        The GeneXpert  MRSA Assay (FDA approved for NASAL specimens only), is one component of a comprehensive MRSA colonization surveillance program. It is not intended to diagnose MRSA infection nor to guide or monitor treatment for MRSA infections.     RADIOLOGY STUDIES/RESULTS: Ct Head Wo Contrast  09/07/2015  CLINICAL DATA:  64 year old with shortness of breath and syncopal episode. History of atrial fibrillation. Initial encounter. EXAM: CT HEAD WITHOUT CONTRAST TECHNIQUE: Contiguous axial images were obtained from the base of the skull through the vertex without intravenous contrast. COMPARISON:  None. FINDINGS: There is no evidence of acute intracranial hemorrhage, mass lesion, brain edema or extra-axial fluid collection. The ventricles and subarachnoid spaces are appropriately sized for age. There is questionable low-density medially in the left occipital lobe on images 18 and 19 which could reflect encephalomalacia or a subacute infarct. There is minimal patchy periventricular white matter disease, likely due to chronic small vessel ischemic change. Intracranial atherosclerosis noted. Mild anterior left ethmoid sinus opacification. The visualized paranasal sinuses, mastoid air cells and middle ears are otherwise clear. The calvarium is intact. IMPRESSION: 1. Potential subacute subcortical infarct or encephalomalacia in the left occipital lobe. 2. No evidence of acute intracranial hemorrhage, midline shift or  hydrocephalus. Electronically Signed   By: Richardean Sale M.D.   On: 09/07/2015 15:51   Ct Angio Chest Pe W/cm &/or Wo Cm  09/07/2015  CLINICAL DATA:  Shortness of breath. Atrial fibrillation. Syncopal episode. Dizziness. EXAM: CT ANGIOGRAPHY CHEST WITH CONTRAST TECHNIQUE: Multidetector CT imaging of the chest was performed using the standard protocol during bolus administration of intravenous contrast. Multiplanar CT image reconstructions and MIPs were obtained to evaluate the vascular anatomy. CONTRAST:  148mL OMNIPAQUE IOHEXOL 300 MG/ML  SOLN COMPARISON:  Chest radiograph from earlier today. FINDINGS: Mediastinum/Nodes: The study is moderate quality for the evaluation of pulmonary embolism, with evaluation of the subsegmental pulmonary arteries limited by motion artifact. There are no filling defects in the central, lobar or segmental pulmonary artery branches to suggest acute pulmonary embolism. Great vessels are normal in course and caliber. Mild cardiomegaly. No pericardial fluid/thickening. Left anterior descending coronary atherosclerosis. There is a mild multinodular goiter asymmetrically involving the left thyroid lobe, including a partially calcified 1.5 cm posterior upper left thyroid lobe nodule and a hypodense 2.2 cm lower left thyroid lobe nodule. Normal esophagus. No axillary adenopathy. There are a few top-normal size right paratracheal nodes and right hilar nodes containing coarse internal calcifications, in keeping with prior granulomatous disease. Otherwise no mediastinal or hilar adenopathy. Lungs/Pleura: No pneumothorax. Trace layering right pleural effusion. No left pleural effusion. Coarsely calcified subcentimeter granuloma in the anterior right upper lobe. No acute consolidative airspace disease, significant pulmonary nodules or lung masses. There is mild passive atelectasis in the dependent right lower lobe and lingula. There is a faint mosaic attenuation throughout both lungs with  scattered mild interlobular septal thickening, most suggestive of mild pulmonary edema. Upper abdomen: Unremarkable. Musculoskeletal: No aggressive appearing focal osseous lesions. Mild degenerative changes in the thoracic spine. Review of the MIP images confirms the above findings. IMPRESSION: 1. No evidence of pulmonary embolism. 2. Mild cardiomegaly. Trace right pleural effusion. Mild pulmonary edema. Findings are most in keeping with mild congestive heart failure. 3. Mild multinodular goiter, with a dominant 2.2 cm left thyroid lobe nodule. Recommend short-term outpatient correlation with thyroid ultrasound. This follows ACR consensus guidelines: Managing Incidental Thyroid Nodules Detected on Imaging: White Paper of the ACR Incidental Thyroid Findings Committee. J Am Coll Radiol 2015; 12:143-150. Electronically Signed  By: Ilona Sorrel M.D.   On: 09/07/2015 15:51   Dg Chest Port 1 View  09/06/2015  CLINICAL DATA:  64 year old female with shortness of breath 1 week ago, and swelling in the feet and legs over the last month. EXAM: PORTABLE CHEST 1 VIEW COMPARISON:  No priors. FINDINGS: There is cephalization of the pulmonary vasculature and slight indistinctness of the interstitial markings suggestive of mild pulmonary edema. Mild cardiomegaly. No definite pleural effusions. Upper mediastinal contours are within normal limits. IMPRESSION: 1. The appearance the chest suggests mild congestive heart failure, as above. Electronically Signed   By: Vinnie Langton M.D.   On: 09/06/2015 18:51    Oren Binet, MD  [facility] Pager:[phone]  If 7PM-7AM, please contact night-coverage [email or web address] Password [email or web address] 09/09/2015, 12:59 PM   LOS: 3 days

## 2015-09-09 NOTE — Consult Note (Signed)
ELECTROPHYSIOLOGY CONSULT NOTE    Referring Physician:  Dr Wynonia Lawman  Admit Date: 09/06/2015  Reason for consultation:  afib with bradycardia  Amber Medina is a 64 y.o. female with a h/o morbid obesity and probable sleep apnea who is now admitted with persistent atrial fibrillation.  The patient reports progressive lower extremity swelling and edema.  She has noticed decreased exercise tolerance.  She is not very active. She presented to [facility] ED and was noted to have afib as well as significant volume overload.  She has been diuresed.   On 09/07/15, she had transient dizziness while seated on the commode.  She did not have arrhythmia on telemetry at the time.  She has been observed to have subsequent short pauses primarily in the setting of respiratory failure or sleeping. Today, she denies symptoms of palpitations, chest pain, s  syncope, or neurologic sequela. The patient is tolerating medications without difficulties and is otherwise without complaint today.   Past Medical History  Diagnosis Date  . Morbid obesity ([place])   . Persistent atrial fibrillation ([place])   . Snoring    Past Surgical History  Procedure Laterality Date  . Tonsillectomy      . furosemide  60 mg Intravenous Q8H  . lisinopril  5 mg Oral Daily  . potassium chloride  40 mEq Oral BID   . diltiazem (CARDIZEM) infusion 7.5 mg/hr (09/09/15 1108)  . heparin 1,800 Units/hr (09/09/15 1108)    Allergies  Allergen Reactions  . Compazine [Prochlorperazine Edisylate] Swelling    Social History   Social History  . Marital Status: Single    Spouse Name: N/A  . Number of Children: N/A  . Years of Education: N/A   Occupational History  . Not on file.   Social History Main Topics  . Smoking status: Former Research scientist (life sciences)  . Smokeless tobacco: Not on file  . Alcohol Use: Yes  . Drug Use: No  . Sexual Activity: Not on file   Other Topics Concern  . Not on file   Social History Narrative    Family History  Problem Relation  Age of Onset  . Diabetes Mellitus II Mother   . Hypertension Mother   . Congenital heart disease Mother   . Hypertension Father     ROS- All systems are reviewed and negative except as per the HPI above  Physical Exam: Telemetry: Filed Vitals:   09/08/15 2331 09/09/15 0331 09/09/15 0436 09/09/15 0700  BP: 126/66 100/80 114/87 95/68  Pulse: 39 89 85 79  Temp:   98.5 F (36.9 C)   TempSrc:   Oral   Resp: 18 20 18 16   Height:      Weight:   338 lb 6.5 oz (153.5 kg)   SpO2: 92% 98% 91% 94%    GEN- The patient is morbidly obese and chronically ill appearing, alert and oriented x 3 today.   Head- normocephalic, atraumatic Eyes-  Sclera clear, conjunctiva pink Ears- hearing intact Oropharynx- clear Neck- supple,  Lungs- decreased BS at the bases, normal work of breathing Heart- irregular rate and rhythm  GI- soft, NT, ND, + BS Extremities- no clubbing, cyanosis, + dependant edema MS- no significant deformity or atrophy Skin- no rash or lesion Psych- euthymic mood, full affect Neuro- strength and sensation are intact  EKG ekg upon admit reveals afib with RVR Echo is reviewed, EF is preserved  Labs:   Lab Results  Component Value Date   WBC 9.0 09/09/2015   HGB 14.4 09/09/2015  HCT 47.0* 09/09/2015   MCV 92.7 09/09/2015   PLT 225 09/09/2015    Recent Labs Lab 09/07/15 0655  09/09/15 0414  NA 143  < > 144  K 3.8  < > 4.4  CL 107  < > 98*  CO2 26  < > 34*  BUN 19  < > 17  CREATININE 1.18*  < > 1.12*  CALCIUM 8.9  < > 9.6  PROT 6.4*  --   --   BILITOT 1.5*  --   --   ALKPHOS 57  --   --   ALT 34  --   --   AST 34  --   --   GLUCOSE 122*  < > 94  < > = values in this interval not displayed. Lab Results  Component Value Date   TROPONINI 0.04* 09/07/2015    Lab Results  Component Value Date   CHOL 155 09/09/2015   Lab Results  Component Value Date   HDL 33* 09/09/2015   Lab Results  Component Value Date   LDLCALC 99 09/09/2015   Lab Results    Component Value Date   TRIG 116 09/09/2015   Lab Results  Component Value Date   CHOLHDL 4.7 09/09/2015   No results found for: LDLDIRECT    ASSESSMENT AND PLAN:   1. Persistent atrial fibrillation The patient has persistent afib.  She is symptomatic primarily with CHF due to RVR.  I would advise rate control at this time.  Will convert cardizem to oral.  Stop heparin and start on xarelto. Could consider elective cardioversion if not improved with diuresis and weight control though I am not optimistic that she will be able to maintain sinus rhythm long term. Lifestyle modification is necessary  2. Bradycardia Likely due to respiratory failure I suspect that she has sleep apnea and will require outpatient sleep study No indication for pacemaker at this time  3. Obesity Body mass index is 52.99 kg/(m^2). Weight loss is necessary for her to do well long term  General cardiology to follow  Electrophysiology team to see as needed while here. Please call with questions.    Thompson Grayer, MD 09/09/2015  11:46 AM

## 2015-09-09 NOTE — Progress Notes (Signed)
ANTICOAGULATION CONSULT NOTE - Follow Up Consult  Pharmacy Consult for Heparin Indication: atrial fibrillation  Allergies  Allergen Reactions  . Compazine [Prochlorperazine Edisylate] Swelling    Patient Measurements: Height: 5\' 7"  (170.2 cm) Weight: (!) 338 lb 6.5 oz (153.5 kg) IBW/kg (Calculated) : 61.6 Heparin Dosing Weight: 101 kg  Vital Signs: Temp: 98.5 F (36.9 C) (12/04 0436) Temp Source: Oral (12/04 0436) BP: 95/68 mmHg (12/04 0700) Pulse Rate: 79 (12/04 0700)  Labs:  Recent Labs  09/06/15 1839 09/07/15 0150 09/07/15 0655  09/08/15 0953 09/08/15 1901 09/09/15 0414  HGB 15.2*  --  14.3  --  14.2  --  14.4  HCT 48.9*  --  45.2  --  46.9*  --  47.0*  PLT 259  --  227  --  233  --  225  APTT 31  --   --   --   --   --   --   LABPROT 15.6*  --   --   --   --   --   --   INR 1.22  --   --   --   --   --   --   HEPARINUNFRC  --  0.28*  --   < > 0.24* 0.51 0.40  CREATININE 0.95 1.04* 1.18*  --  1.11*  --  1.12*  TROPONINI  --  0.03 0.04*  --   --   --   --   < > = values in this interval not displayed.  Estimated Creatinine Clearance: 76.8 mL/min (by C-G formula based on Cr of 1.12).   Medications:  Scheduled:  . furosemide  60 mg Intravenous Q8H  . lisinopril  5 mg Oral Daily  . potassium chloride  40 mEq Oral BID   Infusions:  . diltiazem (CARDIZEM) infusion 7.5 mg/hr (09/08/15 2017)  . heparin 1,800 Units/hr (09/08/15 2015)    Assessment: 64 yo F started on heparin infusion for new onset afib with RVR, currently on Dilt gtt as well.  Heparin level is therapeutic on 1800 units/hr.    Of note, pt did have CT scan which showed possible subacute infarct without hemorrhage and thus we are targeting a lower heparin level goal per pharmacy protocol.  Goal of Therapy:  Heparin level 0.3-0.5 units/ml Monitor platelets by anticoagulation protocol: Yes   Plan:  Heparin at 1800 units/hr Continue daily heparin level and CBC Follow up plans for oral  anticoagulation  Lezlee Gills, Pharm.D., BCPS Clinical Pharmacist Pager [phone] 09/09/2015 9:06 AM

## 2015-09-09 NOTE — Progress Notes (Addendum)
21 beats NSVT noted on telemetry.  Pt asymptomatic, sleeping.  VSS.  Kathline Magic, NP paged.  Awaiting return call.  Report given to [place], Therapist, sports.  Jodell Cipro [id number]:  Dr. Wynonia Lawman on unit, and notified of the above.  Jodell Cipro

## 2015-09-09 NOTE — Plan of Care (Signed)
Problem: Activity: Goal: Capacity to carry out activities will improve Outcome: Progressing Pt able to walk to and from restroom with minimal assist, HR 120s, denies CP or SOB, tolerated bed bath and oral care with out difficulties.

## 2015-09-10 DIAGNOSIS — J9601 Acute respiratory failure with hypoxia: Secondary | ICD-10-CM

## 2015-09-10 DIAGNOSIS — G4733 Obstructive sleep apnea (adult) (pediatric): Secondary | ICD-10-CM | POA: Insufficient documentation

## 2015-09-10 DIAGNOSIS — I5021 Acute systolic (congestive) heart failure: Secondary | ICD-10-CM

## 2015-09-10 DIAGNOSIS — R001 Bradycardia, unspecified: Secondary | ICD-10-CM

## 2015-09-10 DIAGNOSIS — R601 Generalized edema: Secondary | ICD-10-CM

## 2015-09-10 DIAGNOSIS — I4891 Unspecified atrial fibrillation: Secondary | ICD-10-CM

## 2015-09-10 LAB — CBC
HEMATOCRIT: 46.4 % — AB (ref 36.0–46.0)
HEMOGLOBIN: 14.6 g/dL (ref 12.0–15.0)
MCH: 28.6 pg (ref 26.0–34.0)
MCHC: 31.5 g/dL (ref 30.0–36.0)
MCV: 91 fL (ref 78.0–100.0)
Platelets: 211 10*3/uL (ref 150–400)
RBC: 5.1 MIL/uL (ref 3.87–5.11)
RDW: 14.2 % (ref 11.5–15.5)
WBC: 8.9 10*3/uL (ref 4.0–10.5)

## 2015-09-10 LAB — BASIC METABOLIC PANEL
ANION GAP: 11 (ref 5–15)
BUN: 17 mg/dL (ref 6–20)
CHLORIDE: 95 mmol/L — AB (ref 101–111)
CO2: 33 mmol/L — AB (ref 22–32)
Calcium: 9.2 mg/dL (ref 8.9–10.3)
Creatinine, Ser: 1.01 mg/dL — ABNORMAL HIGH (ref 0.44–1.00)
GFR calc Af Amer: >60 mL/min (ref >60)
GFR calc non Af Amer: 57 mL/min — ABNORMAL LOW (ref >60)
GLUCOSE: 90 mg/dL (ref 65–99)
POTASSIUM: 4.1 mmol/L (ref 3.5–5.1)
Sodium: 139 mmol/L (ref 135–145)

## 2015-09-10 MED ORDER — DILTIAZEM HCL 60 MG PO TABS
90.0000 mg | ORAL_TABLET | Freq: Four times a day (QID) | ORAL | Status: DC
  Administered 2015-09-10 – 2015-09-14 (×16): 90 mg via ORAL
  Filled 2015-09-10 (×16): qty 1

## 2015-09-10 MED ORDER — DILTIAZEM HCL 30 MG PO TABS
30.0000 mg | ORAL_TABLET | Freq: Once | ORAL | Status: AC
  Administered 2015-09-10: 30 mg via ORAL
  Filled 2015-09-10: qty 1

## 2015-09-10 MED ORDER — OFF THE BEAT BOOK
Freq: Once | Status: AC
  Administered 2015-09-10: 1
  Filled 2015-09-10: qty 1

## 2015-09-10 MED ORDER — DIGOXIN 0.25 MG/ML IJ SOLN
0.1250 mg | Freq: Every day | INTRAMUSCULAR | Status: DC
  Administered 2015-09-10 – 2015-09-11 (×2): 0.125 mg via INTRAVENOUS
  Filled 2015-09-10 (×3): qty 0.5

## 2015-09-10 NOTE — Progress Notes (Signed)
PATIENT DETAILS Name: Amber Medina Age: 64 y.o. Sex: female Date of Birth: [birth year] Admit Date: 09/06/2015 Admitting Physician Reubin Milan, MD PCP:No primary care provider on file.  Brief narrative: 64 year old morbidly obese female presented to the hospital with worsening shortness of breath and lower extremity edema. In the emergency room, patient was found to have A. fib with RVR. Started on IV Cardizem, IV Lasix and admitted for further evaluation and treatment. Hospital course has been complicated by development of episodes of apnea and desaturation along with bradycardia/pauses.   Subjective: Still in A. fib RVR-but rate much better-SOB much improved  Assessment/Plan: Acute systolic heart failure along with right-sided heart failure: Much more compensated, continue Lasix to 60 mg IV every 8 hours. Weight down to 334 pounds (357 pounds on admission), negative balance of 15.3 L. Suspect left ventricular failure worsened by A. fib RVR, right-sided heart failure likely secondary to pulmonary hypertension/obesity. Cardiology following.  A. fib RVR: Although heart rate better, still in RVR, Initially on IV Cardizem and IV heparin. Has now been transitioned to oral cardizem and Xarelto. Cards following and assisting in rate control. CHADS2VASC score is 2   History of bradycardia/sinus pauses with hypoxemia: Suspect secondary to OSA.EP consulted and felt not to require PPM or further work up. PCCM consulted-CPAP QHS & PRN Sleep. Will need Assess for ambulatory O2 needs prior to discharge and outpatient sleep study after discharge  OSA/OSH:see above  Acute on chronic hypoxic respiratory failure: Suspect more for chronic issue secondary to above. Oxygen as needed, nocturnal O2 saturation recommended by PCCM  Morbid obesity: Counseled regarding importance of weight loss  Mild multinodular goiter, with a dominant 2.2 cm left thyroid lobe nodule: Incidental finding on CT  chest. Thyroid ultrasound when more stable-suspect could be done as an outpatient   Low-density Lesion in the left occipital lobe on CT head: CT head report this area is either subacute CVA or encephalomalacia, suspect the latter as no strokelike symptoms and complete nonfocal exam. MRI brain ordered for further delineation-however patient is claustrophobic and not keen on getting it. Carotid Doppler negative for stenosis, echocardiogram showed mild systolic dysfunction-is already on anticoagulation for atrial fibrillation. Suspect even if this is a small CVA-MRI brain will not change overall management. A1c 5.9, check LDL.  Disposition: Remain inpatient  Antimicrobial agents  See below  Anti-infectives    None      DVT Prophylaxis: IV Heparin   Code Status: Full code   Family Communication None at bedside  Procedures: None  CONSULTS:  cardiology and pulmonary/intensive care  Time spent 25 minutes-Greater than 50% of this time was spent in counseling, explanation of diagnosis, planning of further management, and coordination of care.  MEDICATIONS: Scheduled Meds: . digoxin  0.125 mg Intravenous Daily  . diltiazem  90 mg Oral 4 times per day  . furosemide  60 mg Intravenous Q8H  . lisinopril  5 mg Oral Daily  . potassium chloride  40 mEq Oral BID  . rivaroxaban  20 mg Oral Q supper   Continuous Infusions:   PRN Meds:.ALPRAZolam, iohexol    PHYSICAL EXAM: Vital signs in last 24 hours: Filed Vitals:   09/10/15 1112 09/10/15 1113 09/10/15 1114 09/10/15 1115  BP:      Pulse: 94 99 101 89  Temp:      TempSrc:      Resp: 18 24 18 28   Height:  Weight:      SpO2: 96% 97% 97% 95%    Weight change: -1.863 kg (-4 lb 1.7 oz) Filed Weights   09/08/15 0432 09/09/15 0436 09/10/15 0500  Weight: 158.033 kg (348 lb 6.4 oz) 153.5 kg (338 lb 6.5 oz) 151.637 kg (334 lb 4.8 oz)   Body mass index is 52.35 kg/(m^2).   Gen Exam: Awake and alert with clear speech.     Neck: Supple, No JVD.   Chest: B/L Clear.   CVS: S1 S2 irregular-tachy, no murmurs.  Abdomen: soft, BS +, non tender, non distended.  Extremities: + edema, lower extremities warm to touch. Neurologic: Non Focal.   Skin: No Rash.   Wounds: N/A.   Intake/Output from previous day:  Intake/Output Summary (Last 24 hours) at 09/10/15 1145 Last data filed at 09/10/15 0839  Gross per 24 hour  Intake 1124.75 ml  Output   4375 ml  Net -3250.25 ml     LAB RESULTS: CBC  Recent Labs Lab 09/06/15 1839 09/07/15 0655 09/08/15 0953 09/09/15 0414 09/10/15 0500  WBC 9.2 10.0 9.4 9.0 8.9  HGB 15.2* 14.3 14.2 14.4 14.6  HCT 48.9* 45.2 46.9* 47.0* 46.4*  PLT 259 227 233 225 211  MCV 92.8 91.3 93.1 92.7 91.0  MCH 28.8 28.9 28.2 28.4 28.6  MCHC 31.1 31.6 30.3 30.6 31.5  RDW 14.5 14.5 14.7 14.4 14.2  LYMPHSABS 1.5 1.1  --   --   --   MONOABS 0.9 0.9  --   --   --   EOSABS 0.1 0.1  --   --   --   BASOSABS 0.0 0.0  --   --   --     Chemistries   Recent Labs Lab 09/06/15 2011 09/07/15 0150 09/07/15 0655 09/08/15 0953 09/09/15 0414 09/10/15 0500  NA  --  142 143 141 144 139  K  --  4.6 3.8 4.2 4.4 4.1  CL  --  107 107 104 98* 95*  CO2  --  28 26 26  34* 33*  GLUCOSE  --  123* 122* 135* 94 90  BUN  --  18 19 19 17 17   CREATININE  --  1.04* 1.18* 1.11* 1.12* 1.01*  CALCIUM  --  8.9 8.9 9.3 9.6 9.2  MG 1.8  --   --   --   --   --     CBG:  Recent Labs Lab 09/07/15 0507  GLUCAP 188*    GFR Estimated Creatinine Clearance: 84.4 mL/min (by C-G formula based on Cr of 1.01).  Coagulation profile  Recent Labs Lab 09/06/15 1839  INR 1.22    Cardiac Enzymes  Recent Labs Lab 09/07/15 0150 09/07/15 0655  TROPONINI 0.03 0.04*    Invalid input(s): POCBNP No results for input(s): DDIMER in the last 72 hours. No results for input(s): HGBA1C in the last 72 hours.  Recent Labs  09/09/15 0414  CHOL 155  HDL 33*  LDLCALC 99  TRIG 116  CHOLHDL 4.7   No results  for input(s): TSH, T4TOTAL, T3FREE, THYROIDAB in the last 72 hours.  Invalid input(s): FREET3 No results for input(s): VITAMINB12, FOLATE, FERRITIN, TIBC, IRON, RETICCTPCT in the last 72 hours. No results for input(s): LIPASE, AMYLASE in the last 72 hours.  Urine Studies No results for input(s): UHGB, CRYS in the last 72 hours.  Invalid input(s): UACOL, UAPR, USPG, UPH, UTP, UGL, UKET, UBIL, UNIT, UROB, ULEU, UEPI, UWBC, URBC, UBAC, CAST, UCOM, BILUA  MICROBIOLOGY: Recent Results (  from the past 240 hour(s))  MRSA PCR Screening     Status: None   Collection Time: 09/07/15 12:12 AM  Result Value Ref Range Status   MRSA by PCR NEGATIVE NEGATIVE Final    Comment:        The GeneXpert MRSA Assay (FDA approved for NASAL specimens only), is one component of a comprehensive MRSA colonization surveillance program. It is not intended to diagnose MRSA infection nor to guide or monitor treatment for MRSA infections.     RADIOLOGY STUDIES/RESULTS: Ct Head Wo Contrast  09/07/2015  CLINICAL DATA:  64 year old with shortness of breath and syncopal episode. History of atrial fibrillation. Initial encounter. EXAM: CT HEAD WITHOUT CONTRAST TECHNIQUE: Contiguous axial images were obtained from the base of the skull through the vertex without intravenous contrast. COMPARISON:  None. FINDINGS: There is no evidence of acute intracranial hemorrhage, mass lesion, brain edema or extra-axial fluid collection. The ventricles and subarachnoid spaces are appropriately sized for age. There is questionable low-density medially in the left occipital lobe on images 18 and 19 which could reflect encephalomalacia or a subacute infarct. There is minimal patchy periventricular white matter disease, likely due to chronic small vessel ischemic change. Intracranial atherosclerosis noted. Mild anterior left ethmoid sinus opacification. The visualized paranasal sinuses, mastoid air cells and middle ears are otherwise clear.  The calvarium is intact. IMPRESSION: 1. Potential subacute subcortical infarct or encephalomalacia in the left occipital lobe. 2. No evidence of acute intracranial hemorrhage, midline shift or hydrocephalus. Electronically Signed   By: Richardean Sale M.D.   On: 09/07/2015 15:51   Ct Angio Chest Pe W/cm &/or Wo Cm  09/07/2015  CLINICAL DATA:  Shortness of breath. Atrial fibrillation. Syncopal episode. Dizziness. EXAM: CT ANGIOGRAPHY CHEST WITH CONTRAST TECHNIQUE: Multidetector CT imaging of the chest was performed using the standard protocol during bolus administration of intravenous contrast. Multiplanar CT image reconstructions and MIPs were obtained to evaluate the vascular anatomy. CONTRAST:  179mL OMNIPAQUE IOHEXOL 300 MG/ML  SOLN COMPARISON:  Chest radiograph from earlier today. FINDINGS: Mediastinum/Nodes: The study is moderate quality for the evaluation of pulmonary embolism, with evaluation of the subsegmental pulmonary arteries limited by motion artifact. There are no filling defects in the central, lobar or segmental pulmonary artery branches to suggest acute pulmonary embolism. Great vessels are normal in course and caliber. Mild cardiomegaly. No pericardial fluid/thickening. Left anterior descending coronary atherosclerosis. There is a mild multinodular goiter asymmetrically involving the left thyroid lobe, including a partially calcified 1.5 cm posterior upper left thyroid lobe nodule and a hypodense 2.2 cm lower left thyroid lobe nodule. Normal esophagus. No axillary adenopathy. There are a few top-normal size right paratracheal nodes and right hilar nodes containing coarse internal calcifications, in keeping with prior granulomatous disease. Otherwise no mediastinal or hilar adenopathy. Lungs/Pleura: No pneumothorax. Trace layering right pleural effusion. No left pleural effusion. Coarsely calcified subcentimeter granuloma in the anterior right upper lobe. No acute consolidative airspace disease,  significant pulmonary nodules or lung masses. There is mild passive atelectasis in the dependent right lower lobe and lingula. There is a faint mosaic attenuation throughout both lungs with scattered mild interlobular septal thickening, most suggestive of mild pulmonary edema. Upper abdomen: Unremarkable. Musculoskeletal: No aggressive appearing focal osseous lesions. Mild degenerative changes in the thoracic spine. Review of the MIP images confirms the above findings. IMPRESSION: 1. No evidence of pulmonary embolism. 2. Mild cardiomegaly. Trace right pleural effusion. Mild pulmonary edema. Findings are most in keeping with mild congestive heart failure. 3.  Mild multinodular goiter, with a dominant 2.2 cm left thyroid lobe nodule. Recommend short-term outpatient correlation with thyroid ultrasound. This follows ACR consensus guidelines: Managing Incidental Thyroid Nodules Detected on Imaging: White Paper of the ACR Incidental Thyroid Findings Committee. J Am Coll Radiol 2015; 12:143-150. Electronically Signed   By: Ilona Sorrel M.D.   On: 09/07/2015 15:51   Dg Chest Port 1 View  09/06/2015  CLINICAL DATA:  64 year old female with shortness of breath 1 week ago, and swelling in the feet and legs over the last month. EXAM: PORTABLE CHEST 1 VIEW COMPARISON:  No priors. FINDINGS: There is cephalization of the pulmonary vasculature and slight indistinctness of the interstitial markings suggestive of mild pulmonary edema. Mild cardiomegaly. No definite pleural effusions. Upper mediastinal contours are within normal limits. IMPRESSION: 1. The appearance the chest suggests mild congestive heart failure, as above. Electronically Signed   By: Vinnie Langton M.D.   On: 09/06/2015 18:51    Oren Binet, MD  [facility] Pager:[phone]  If 7PM-7AM, please contact night-coverage [email or web address] Password [email or web address] 09/10/2015, 11:45 AM   LOS: 4 days

## 2015-09-10 NOTE — Discharge Instructions (Addendum)
Follow with Primary MD in 1-2 weeks  Please follow with cardiology (Dr Radford Pax) for elective cardioversion.  Keep your appointment with Sleep Study Lab  Make sure your primary MD gets a Thyroid Ultrasound-you had thyroid nodules seen incidentally on your CT Chest  Continue using CPAP  Please get a complete blood count and chemistry panel checked by your Primary MD at your next visit, and again as instructed by your Primary MD.  You have Congestive Heart Failure: Please call your Cardiologist or Primary MD-Anytime you have any of the following symptoms: 1) 3 pound weight gain in 24 hours or 5 pounds in 1 week 2) shortness of breath, with or without a dry hacking cough 3) swelling in the hands, feet or stomach 4) if you have to sleep on extra pillows at night in order to breathe Follow cardiac low salt diet and 1.5 lit/day fluid restriction.   Get Medicines reviewed and adjusted. Please take all your medications with you for your next visit with your Primary MD  Please request your Primary MD to go over all hospital tests and procedure/radiological results at the follow up, please ask your Primary MD to get all Hospital records sent to his/her office.  If you experience worsening of your admission symptoms, develop shortness of breath, life threatening emergency, suicidal or homicidal thoughts you must seek medical attention immediately by calling 911 or calling your MD immediately  if symptoms less severe.  You must read complete instructions/literature along with all the possible adverse reactions/side effects for all the Medicines you take and that have been prescribed to you. Take any new Medicines after you have completely understood and accpet all the possible adverse reactions/side effects.   Do not drive when taking Pain medications or sleeping medications (Benzodaizepines)  Do not take more than prescribed Pain, Sleep and Anxiety Medications  Special Instructions: If you have  smoked or chewed Tobacco  in the last 2 yrs please stop smoking, stop any regular Alcohol  and or any Recreational drug use.  Wear Seat belts while driving.  Please note  You were cared for by a hospitalist during your hospital stay. Once you are discharged, your primary care physician will handle any further medical issues. Please note that NO REFILLS for any discharge medications will be authorized once you are discharged, as it is imperative that you return to your primary care physician (or establish a relationship with a primary care physician if you do not have one) for your aftercare needs so that they can reassess your need for medications and monitor your lab values.          Information on my medicine - XARELTO (Rivaroxaban)  This medication education was reviewed with me or my healthcare representative as part of my discharge preparation.  The pharmacist that spoke with me during my hospital stay was:  Romona Curls, [facility]  Why was Xarelto prescribed for you? Xarelto was prescribed for you to reduce the risk of a blood clot forming that can cause a stroke if you have a medical condition called atrial fibrillation (a type of irregular heartbeat).  What do you need to know about xarelto ? Take your Xarelto ONCE DAILY at the same time every day with your evening meal. If you have difficulty swallowing the tablet whole, you may crush it and mix in applesauce just prior to taking your dose.  Take Xarelto exactly as prescribed by your doctor and DO NOT stop taking Xarelto without talking to the  doctor who prescribed the medication.  Stopping without other stroke prevention medication to take the place of Xarelto may increase your risk of developing a clot that causes a stroke.  Refill your prescription before you run out.  After discharge, you should have regular check-up appointments with your healthcare provider that is prescribing your Xarelto.  In the future your dose  may need to be changed if your kidney function or weight changes by a significant amount.  What do you do if you miss a dose? If you are taking Xarelto ONCE DAILY and you miss a dose, take it as soon as you remember on the same day then continue your regularly scheduled once daily regimen the next day. Do not take two doses of Xarelto at the same time or on the same day.   Important Safety Information A possible side effect of Xarelto is bleeding. You should call your healthcare provider right away if you experience any of the following: ? Bleeding from an injury or your nose that does not stop. ? Unusual colored urine (red or dark brown) or unusual colored stools (red or black). ? Unusual bruising for unknown reasons. ? A serious fall or if you hit your head (even if there is no bleeding).  Some medicines may interact with Xarelto and might increase your risk of bleeding while on Xarelto. To help avoid this, consult your healthcare provider or pharmacist prior to using any new prescription or non-prescription medications, including herbals, vitamins, non-steroidal anti-inflammatory drugs (NSAIDs) and supplements.  This website has more information on Xarelto: [email or web address]

## 2015-09-10 NOTE — Progress Notes (Signed)
Patient HR 130's-150's while sitting on side of bed eating breakfast. BP 109/87. Paged, MD to make aware, will continue to monitor.

## 2015-09-10 NOTE — Progress Notes (Signed)
Called by Lenna Sciara, RN.  Patient's HR 130-140 while eating breakfast.  Asymptomatic.  Increase diltiazem to 90mg , Q6hr.  30 mg now.  Giuseppe Duchemin, PAC

## 2015-09-10 NOTE — Progress Notes (Signed)
Name: Amber Medina MRN: [id number] DOB: July 29, 1951    ADMISSION DATE:  09/06/2015 CONSULTATION DATE:  09/08/15  REFERRING MD :  Dr. Sloan Leiter   CHIEF COMPLAINT:  Bradycardia / Hypoxemia, concern for sleep apnea   BRIEF PATIENT DESCRIPTION: 64 y/o F, former smoker, with PMH of obesity & HTN (not on meds prior to current admit) who was admitted 12/1 for increasing SOB.  The patient was later noted to have periods of apnea, bradycardia (once on toilet, another while sleeping), & hypoxemia.  She also was noted to have AF with RVR and CXR with mild CHF.  PCCM consulted for evaluation of hypoxemia / concern for sleep apnea.    SIGNIFICANT EVENTS  12/01  Admit for increasing SOB 12/03  PCCM consulted for hypoxemia & concern for OSA  STUDIES:  12/02  Carotid Doppler >> vertebral arteries appear patent, 39% stenosis involving the R ICA and the L ICA 12/02  ECHO >> R heart enlargement, reduced RV systolic function, PA peak 36 12/02  CT Head >> potential subacute subcortical infarct or encephalomalacia in the L occipital lobe, no acute findings 12/02  CTA Chest >> no PE, mild cardiomegaly, trace R effusion, mild pulmonary edema, mild multinodular goiter   HISTORY OF PRESENT ILLNESS:  64 y/o F, former smoker (quit July 2016, smoked since age 42, smoked intermittently, up to 1ppd) with PMH of HTN (not on meds prior to admit) who presented to [facility] 12/1 for increased SOB and lower extremity swelling.    The patient reports prior to August, she was in her usual state of health.  No difficulties with activity.  She splits her time between [place] and [place].   In August she was treated for sinusitis in [place], Rx'd with antibiotics.  She initially felt better.  In September she went to to a conference and caught a respiratory "bug" and improved over 5-6 days.  Later in September, she began feeling bad again noting increased SOB in scenarios where she wouldn't usually get short of breath.  It became progressively worse  to the point that at Thanksgiving when she visited her daughter and she couldn't walk 10 feet without stopping for rest.   On admit, the patient presented for increased SOB.  She states she couldn't walk 5 feet without rest.  She reports she couldn't speak a complete sentence.  She recently had driven from [place] to [facility] and noted she was nodding off while driving.  Increased lower extremity swelling and swelling up to the abdomen (to the point she couldn't put her finger in her belly button) that began approximately 3 weeks prior to admit.    Daughter reports the patient snores when sleeping.  She also has noted periods of apnea.  The patient reports she will wake herself up at night with jerking awake.     On admission, the patient was noted to have AF with RVR, anasarca, & hyperglycemia.  She was treated with cardizem and diuresis.  Cardiology was consulted for evaluation.  She was later noted to have periods of bradycardia and hypoxemia.  Bradycardic episodes occurred once on the toilet and once at night.  The patient reports hypoxia episodes occurred after activity or after waking from sleep.  CT of the head was negative for acute hemorrhage but did show a questionable subacute subcortical infarct or encephalomalacia in the L occipital lobe.  CTA of the chest was negative for PE, showed mild cardiomegaly, trace R effusion, mild pulmonary edema, mild multinodular goiter.  Carotid dopplers with 39% stenosis of bilateral ICA's.  ECHO with right enlargement, reduced RV systolic function and PA peak of 36.     SUBJECTIVE:  Some difficulty with CPAP last night - mask issues, comfort. She remains invested in making it work.   VITAL SIGNS: Temp:  [97.9 F (36.6 C)-98.5 F (36.9 C)] 98.5 F (36.9 C) (12/05 1144) Pulse Rate:  [56-149] 89 (12/05 1144) Resp:  [10-37] 37 (12/05 1144) BP: (90-118)/(53-88) 118/88 mmHg (12/05 1144) SpO2:  [94 %-100 %] 95 % (12/05 1144) Weight:  [151.637 kg (334 lb 4.8 oz)]  151.637 kg (334 lb 4.8 oz) (12/05 0500)  PHYSICAL EXAMINATION: General:  Morbidly obese female in NAD  Neuro:  AAOx4, speech clear, pleasant, MAE, normal strength HEENT:  MM pink/moist, unable to assess JVD, MM pink/moist, good dentition  Cardiovascular:  s1s2 irr irr, no m/r/g, AF on monitor Lungs:  Even/non-labored, lungs bilaterally distant but clear  Abdomen:  Obese, soft, bsx4 active  Musculoskeletal:  No acute deformities  Skin:  Trace LE edema in BLE   Recent Labs Lab 09/08/15 0953 09/09/15 0414 09/10/15 0500  NA 141 144 139  K 4.2 4.4 4.1  CL 104 98* 95*  CO2 26 34* 33*  BUN 19 17 17   CREATININE 1.11* 1.12* 1.01*  GLUCOSE 135* 94 90    Recent Labs Lab 09/08/15 0953 09/09/15 0414 09/10/15 0500  HGB 14.2 14.4 14.6  HCT 46.9* 47.0* 46.4*  WBC 9.4 9.0 8.9  PLT 233 225 211   No results found.  ASSESSMENT / PLAN:  Dyspnea - suspect multifactorial in the setting of morbid obesity, atrial fibrillation with RVR, PAH Secondary PAH - noted on ECHO 12/2 OSA / OHS - suspected OSA / OHS, no formal sleep study.  Epworth Sleepiness Scale of 12 Probable COPD, severity unclear  Plan:  Oxygen as needed to support saturations > 92% Assess for ambulatory O2 needs prior to discharge. She will likely need O2 at rest and w exertion.  CPAP QHS & PRN Sleep. She may qualify for NIPPV at home at discharge based on her hypoventilation and pCO2, but suspect she will need documentation on PSG to obtain the device. Could inquire re: requirements with [facility] PSG as outpt   Atrial Fibrillation with RVR  Hypertension Bradycardia - resolved.  Anasarca   Plan: Note possible cardioversion per Dr Thompson Caul notes   Abnormal CT Head - pt has refused MRI at this point  Plan: Further follow up per primary SVC     Baltazar Apo, MD, PhD 09/10/2015, 1:01 PM [facility] [phone] or if no answer [phone]

## 2015-09-10 NOTE — Progress Notes (Signed)
RT setup CPAP for patient last night as a trial. Pt has never used CPAP. MD wanted her to try it here in hospital. FFM was not tolerated. Nasal mask seemed to be ok while RT was in room. RN called about 15 minutes after I left and stated that patient was unable to tolerate. At that time patient was set on Auto titration 10/4. She had 3L O2 bled in. Our CPAP will not go to a lower pressure. She stated it was hard to adjust to the mask and pressure. I had explained we only have the single type of FFM mask and nasal mask. She stated she felt like a failure. I stopped in at later time and she was sleeping. I believe the patient would benefit from a sleep study and a different option for a mask. We are limited to what we have here.

## 2015-09-10 NOTE — Progress Notes (Addendum)
Patient Name: Amber Medina Date of Encounter: 09/10/2015    SUBJECTIVE: Patient is sitting at bedside and in no distress. Feels better each day. Breathing is improved. Currently 15 L negative since admission. Denies chest discomfort. Unable tolerate BiPAP. Family complaint of her snoring over the Thanksgiving holiday.  TELEMETRY:  Atrial fibrillation with rapid ventricular response. Filed Vitals:   09/10/15 1000 09/10/15 1001 09/10/15 1002 09/10/15 1024  BP:    117/87  Pulse: 149 94 117 115  Temp:      TempSrc:      Resp: 19 15 25    Height:      Weight:      SpO2: 97% 96% 98% 94%    Intake/Output Summary (Last 24 hours) at 09/10/15 1107 Last data filed at 09/10/15 0839  Gross per 24 hour  Intake 1124.75 ml  Output   4375 ml  Net -3250.25 ml   LABS: Basic Metabolic Panel:  Recent Labs  09/09/15 0414 09/10/15 0500  NA 144 139  K 4.4 4.1  CL 98* 95*  CO2 34* 33*  GLUCOSE 94 90  BUN 17 17  CREATININE 1.12* 1.01*  CALCIUM 9.6 9.2   CBC:  Recent Labs  09/09/15 0414 09/10/15 0500  WBC 9.0 8.9  HGB 14.4 14.6  HCT 47.0* 46.4*  MCV 92.7 91.0  PLT 225 211    Fasting Lipid Panel:  Recent Labs  09/09/15 0414  CHOL 155  HDL 33*  LDLCALC 99  TRIG 116  CHOLHDL 4.7   BNP    Component Value Date/Time   BNP 149.2* 09/06/2015 1839    ProBNP No results found for: PROBNP  Echocardiogram: Study Conclusions  - Left ventricle: The cavity size was normal. There was mild concentric hypertrophy. Systolic function was probably mildly reduced. - Aortic valve: There was trivial regurgitation. - Mitral valve: There was mild regurgitation. - Right ventricle: The cavity size was mildly dilated. Systolic function was mildly reduced. Hypokinesis of the RV lateral free wall. - Right atrium: The atrium was mildly dilated. - Pulmonary arteries: Systolic pressure was mildly increased. PA peak pressure: 36 mm Hg (S). - Pericardium, extracardiac: A  trivial pericardial effusion was identified.  Radiology/Studies:  No chest x-ray data there is insufficient pulmonary edema and cardiomegaly on CT angiogram. No pulmonary emboli were noted.  Physical Exam: Blood pressure 117/87, pulse 115, temperature 98 F (36.7 C), temperature source Oral, resp. rate 25, height 5\' 7"  (1.702 m), weight 334 lb 4.8 oz (151.637 kg), SpO2 94 %. Weight change: -4 lb 1.7 oz (-1.863 kg)  Wt Readings from Last 3 Encounters:  09/10/15 334 lb 4.8 oz (151.637 kg)    Morbidly obese, pleasant female Chest is clear Persisting bilateral lower extremity edema Distant heart sounds with rapid irregularly irregular rhythm Neurological exam is normal  ASSESSMENT:  1. Persistent atrial fibrillation with rapid ventricular response, duration unknown. Rate control is suboptimal. 2. Acute combined systolic and diastolic heart failure, likely related to tachycardia in setting of atrial fibrillation unknown duration 3. Morbid obesity 4. Likely untreated sleep apnea 5. Pulmonary hypertension with mild right ventricular dysfunction, multifactorial and secondary to obesity, sleep apnea, and left heart diastolic failure.  Plan:  Increase diltiazem for better rate control  I would be in favor of cardioversion. Preferable to have an antiarrhythmic onboard to help maintain rhythm after conversion. With bradycardia, there is concern that antiarrhythmic therapy will aggravate.  For now, will add very low dose digoxin for rate control  as well. Hopefully this will not aggravate bradycardia.  Continue diuresis and maintain potassium greater than 4.  Long discussion concerning atrial fibrillation, management strategies, and possible future need for permanent pacemaker.  We'll need sleep study as an outpatient.  May need TEE cardioversion if we are not able to achieve rate control.  Valerie Roys W 09/10/2015, 11:07 AM

## 2015-09-11 LAB — BASIC METABOLIC PANEL
ANION GAP: 10 (ref 5–15)
BUN: 24 mg/dL — AB (ref 6–20)
CHLORIDE: 95 mmol/L — AB (ref 101–111)
CO2: 33 mmol/L — ABNORMAL HIGH (ref 22–32)
Calcium: 9.2 mg/dL (ref 8.9–10.3)
Creatinine, Ser: 1.12 mg/dL — ABNORMAL HIGH (ref 0.44–1.00)
GFR calc Af Amer: 58 mL/min — ABNORMAL LOW (ref >60)
GFR calc non Af Amer: 50 mL/min — ABNORMAL LOW (ref >60)
Glucose, Bld: 116 mg/dL — ABNORMAL HIGH (ref 65–99)
POTASSIUM: 4.4 mmol/L (ref 3.5–5.1)
SODIUM: 138 mmol/L (ref 135–145)

## 2015-09-11 LAB — CBC
HCT: 46.2 % — ABNORMAL HIGH (ref 36.0–46.0)
HEMOGLOBIN: 14.5 g/dL (ref 12.0–15.0)
MCH: 28.7 pg (ref 26.0–34.0)
MCHC: 31.4 g/dL (ref 30.0–36.0)
MCV: 91.3 fL (ref 78.0–100.0)
Platelets: 210 10*3/uL (ref 150–400)
RBC: 5.06 MIL/uL (ref 3.87–5.11)
RDW: 14.2 % (ref 11.5–15.5)
WBC: 8 10*3/uL (ref 4.0–10.5)

## 2015-09-11 MED ORDER — AMIODARONE HCL IN DEXTROSE 360-4.14 MG/200ML-% IV SOLN
30.0000 mg/h | INTRAVENOUS | Status: DC
  Administered 2015-09-11 – 2015-09-13 (×5): 30 mg/h via INTRAVENOUS
  Filled 2015-09-11 (×6): qty 200

## 2015-09-11 MED ORDER — SODIUM CHLORIDE 0.9 % IV SOLN: INTRAVENOUS | Status: DC

## 2015-09-11 MED ORDER — AMIODARONE LOAD VIA INFUSION
150.0000 mg | Freq: Once | INTRAVENOUS | Status: AC
  Administered 2015-09-11: 150 mg via INTRAVENOUS
  Filled 2015-09-11: qty 83.34

## 2015-09-11 MED ORDER — AMIODARONE HCL IN DEXTROSE 360-4.14 MG/200ML-% IV SOLN
60.0000 mg/h | INTRAVENOUS | Status: AC
  Administered 2015-09-11 (×2): 60 mg/h via INTRAVENOUS
  Filled 2015-09-11 (×2): qty 200

## 2015-09-11 NOTE — Progress Notes (Addendum)
       Patient Name: Amber Medina Date of Encounter: 09/11/2015    SUBJECTIVE: Sitting and on computed. No dyspnea or cardiopulmonary complaints.  TELEMETRY:  Atrial fibrillation with generally rapid ventricular response. With standing and moving around heart rate increases above 120 bpm  Filed Vitals:   09/11/15 0625 09/11/15 0725 09/11/15 0726 09/11/15 1120  BP: 96/61  107/80 118/69  Pulse: 104  60 94  Temp:  98.7 F (37.1 C)  98.7 F (37.1 C)  TempSrc:  Oral  Oral  Resp: 17   17  Height:      Weight:      SpO2: 93%  100% 98%    Intake/Output Summary (Last 24 hours) at 09/11/15 1221 Last data filed at 09/11/15 1157  Gross per 24 hour  Intake   1560 ml  Output   4050 ml  Net  -2490 ml   LABS: Basic Metabolic Panel:  Recent Labs  09/10/15 0500 09/11/15 0454  NA 139 138  K 4.1 4.4  CL 95* 95*  CO2 33* 33*  GLUCOSE 90 116*  BUN 17 24*  CREATININE 1.01* 1.12*  CALCIUM 9.2 9.2   CBC:  Recent Labs  09/10/15 0500 09/11/15 0454  WBC 8.9 8.0  HGB 14.6 14.5  HCT 46.4* 46.2*  MCV 91.0 91.3  PLT 211 210    Fasting Lipid Panel:  Recent Labs  09/09/15 0414  CHOL 155  HDL 33*  LDLCALC 99  TRIG 116  CHOLHDL 4.7    Radiology/Studies:  No new data is available  Physical Exam: Blood pressure 118/69, pulse 94, temperature 98.7 F (37.1 C), temperature source Oral, resp. rate 17, height 5\' 7"  (1.702 m), weight 330 lb (149.687 kg), SpO2 98 %. Weight change: -4 lb 4.8 oz (-1.95 kg)  Wt Readings from Last 3 Encounters:  09/11/15 330 lb (149.687 kg)    Morbidly obese Clear lung fields Large but soft lower extremities. No obvious edema Heart sounds are distant Neurological exam is normal  ASSESSMENT:  1. Persistent atrial fibrillation with poor rate control and relatively soft blood pressures limiting our ability to further increase rate controlling agents. Not likely to maintain sinus rhythm if cardioversion is performed without antiarrhythmic therapy  to give atrial electrical stability. 2. Acute on chronic diastolic heart failure secondary to atrial fibrillation, obesity, and sleep apnea. 3. Morbid obesity 4. Untreated sleep apnea  Plan:  1. We'll convert to a rhythm control strategy given limitations imposed by soft blood pressures. 2. Start amiodarone intravenously 3. Consider TEE cardioversion Thursday or Friday of this week 4. Discontinue digoxin 5. As per pulmonary consult, management of sleep apnea will be crucial.  Signed, Sinclair Grooms 09/11/2015, 12:21 PM

## 2015-09-11 NOTE — Progress Notes (Signed)
RN into check on patient and patient sleeping as evidenced by snoring.  RN asked patient if she wanted to wear her CPAP tonight and patient stated no, she would maybe try it during day shift today if she took a nap.  RN discussed benefits with patient pertaining to wearing the CPAP while she was sleeping especially at night and patient stated understanding and reiterated to RN that she would try it during the day if she napped.

## 2015-09-11 NOTE — Care Management Note (Addendum)
Case Management Note  Patient Details  Name: Amber Medina MRN: [id number] Date of Birth: [birth year]  Subjective/Objective:    Pt admitted for A fib RVR- Pt is from home alone and her neighbor checks in on her daily.                 Action/Plan: CM did discuss [place] with pt and at this time she is declining [place] at this time. CM did speak with pt in regards to DME needs- CPAP. CM did send referral to [facility]. Appointment scheduled for Harper 2, 2017 for 8 pm. Pt is agreeable to appointment. [facility] to see if they can set up CPAP for home use. CM did make a referral to Liaison Jerm[facility] to speak with pt. No further needs at this time. CM will continue to monitor.   Expected Discharge Date:                  Expected Discharge Plan:  Home/Self Care  In-House Referral:  NA  Discharge planning Services  CM Consult  Post Acute Care Choice:  NA Choice offered to:  NA  DME Arranged:  Continuous positive airway pressure (CPAP), Oxygen DME Agency:  [facility] Arranged:  NA HH Agency:  NA  Status of Service:     Medicare Important Message Given:    Date Medicare IM Given:    Medicare IM give by:    Date Additional Medicare IM Given:    Additional Medicare Important Message give by:     If discussed at [place] of Stay Meetings, dates discussed:    Additional Comments: 1252 09-14-15 Jacqlyn Krauss, RN,BSN [phone] CM did have a discussion with pt in regards to insurance. CM did make pt aware that she will need to call [employer] in ref to open enrollment for insurance needs. CM did offer choice for DME needs listed above. Referral made to [facility] and DME to be delivered to pt's home. No further needs from CM at this time. Pt will call a friend for transportation needs.   1231 09-12-15 Jacqlyn Krauss, RN,BSN [phone] Benefits check for pt for Xarelto:  Xarelto: [place] is estimated thru [facility]: $327 for 30 day supply  -Member has a $280 deductible that must be met that is included in this [place]. No auth required. CM did provide pt with 30 day free Xarelto card. CM did call [facility] in [facility] and they can order medication. Unsure if pt will be able to afford. No further needs from CM at this time.    [name], RN 09/11/2015, 4:04 PM

## 2015-09-11 NOTE — Progress Notes (Signed)
Name: Amber Medina MRN: [id number] DOB: Jan 30, 1951    ADMISSION DATE:  09/06/2015 CONSULTATION DATE:  09/08/15  REFERRING MD :  Dr. Sloan Leiter   CHIEF COMPLAINT:  Bradycardia / Hypoxemia, concern for sleep apnea   BRIEF PATIENT DESCRIPTION: 64 y/o F, former smoker, with PMH of obesity & HTN (not on meds prior to current admit) who was admitted 12/1 for increasing SOB.  The patient was later noted to have periods of apnea, bradycardia (once on toilet, another while sleeping), & hypoxemia.  She also was noted to have AF with RVR and CXR with mild CHF.  PCCM consulted for evaluation of hypoxemia / concern for sleep apnea.    SIGNIFICANT EVENTS  12/01  Admit for increasing SOB 12/03  PCCM consulted for hypoxemia & concern for OSA 12/05  Pt with multiple c/o's regarding CPAP.  States not really able to wear   STUDIES:  12/02  Carotid Doppler >> vertebral arteries appear patent, 39% stenosis involving the R ICA and the L ICA 12/02  ECHO >> R heart enlargement, reduced RV systolic function, PA peak 36 12/02  CT Head >> potential subacute subcortical infarct or encephalomalacia in the L occipital lobe, no acute findings 12/02  CTA Chest >> no PE, mild cardiomegaly, trace R effusion, mild pulmonary edema, mild multinodular goiter   SUBJECTIVE:  Pt with multiple c/o's regarding CPAP.  States not really able to wear due to excess humidity and feeling claustrophobic.  RT notes reflect she has refused.  Net negative 19L during admit   VITAL SIGNS: Temp:  [98.3 F (36.8 C)-98.8 F (37.1 C)] 98.7 F (37.1 C) (12/06 0725) Pulse Rate:  [75-115] 104 (12/06 0625) Resp:  [15-37] 17 (12/06 0625) BP: (96-124)/(57-88) 96/61 mmHg (12/06 0625) SpO2:  [93 %-99 %] 93 % (12/06 0625) Weight:  [330 lb (149.687 kg)] 330 lb (149.687 kg) (12/06 0301)  PHYSICAL EXAMINATION: General:  Morbidly obese female in NAD, sitting up on side of bed playing on computer  Neuro:  AAOx4, speech clear, pleasant, MAE, normal  strength HEENT:  MM pink/moist, unable to assess JVD, MM pink/moist, good dentition  Cardiovascular:  s1s2 irr irr, no m/r/g, AF on monitor Lungs:  Even/non-labored, lungs bilaterally distant but clear  Abdomen:  Obese, soft, bsx4 active  Musculoskeletal:  No acute deformities  Skin:  Trace LE edema in BLE   Recent Labs Lab 09/09/15 0414 09/10/15 0500 09/11/15 0454  NA 144 139 138  K 4.4 4.1 4.4  CL 98* 95* 95*  CO2 34* 33* 33*  BUN 17 17 24*  CREATININE 1.12* 1.01* 1.12*  GLUCOSE 94 90 116*    Recent Labs Lab 09/09/15 0414 09/10/15 0500 09/11/15 0454  HGB 14.4 14.6 14.5  HCT 47.0* 46.4* 46.2*  WBC 9.0 8.9 8.0  PLT 225 211 210   No results found.  ASSESSMENT / PLAN:  Dyspnea - suspect multifactorial in the setting of morbid obesity, atrial fibrillation with RVR, PAH Secondary PAH - noted on ECHO 12/2 OSA / OHS - suspected OSA / OHS, no formal sleep study.  Epworth Sleepiness Scale of 12 Probable COPD, severity unclear  Plan:  Oxygen as needed to support saturations > 92% Assess for ambulatory O2 needs prior to discharge. She will likely need O2 at rest and w exertion.  CPAP QHS & PRN Sleep. She may qualify for NIPPV at home at discharge based on her hypoventilation and pCO2, but suspect she will need documentation on PSG to obtain the device. Could inquire  re: requirements with [facility].  She would like to use nasal pillows once evaluated.  Sleep MD evaluation arranged - see d/c section  Will need appt for sleep study prior to Sleep MD evaluation.  Depending on insurance, might be able to do an overnight sleep study at home.      Atrial Fibrillation with RVR  Decompensated sCHF - net neg 19L during admit, admit weight 357 lbs, down to 330 Hypertension Bradycardia - resolved.  Anasarca   Plan: Note possible cardioversion per Dr Thompson Caul notes Diuresis as renal function / BP permit    Abnormal CT Head - pt has refused MRI at this point  Plan: Further follow up  per primary [place], NP-C [facility] Pgr: [phone] or if no answer [phone] 09/11/2015, 10:17 AM   Attending Note:  I have examined patient, reviewed labs, studies and notes. I have discussed the case with B Ollis, and I agree with the data and plans as amended above. Agree with continued attempts to tolerate CPAP in house, PSG on discharge, sleep follow up as arranged with Dr Annamaria Boots.   Baltazar Apo, MD, PhD 09/11/2015, 10:35 AM [facility] [phone] or if no answer [phone]

## 2015-09-11 NOTE — Progress Notes (Signed)
Pt stated she had informed RN that she did not want to wear cpap tonight that she would try tomorrow night possibly. RT informed pt to call for RT if she changes her mind during the night

## 2015-09-11 NOTE — Plan of Care (Signed)
Problem: Activity: Goal: Risk for activity intolerance will decrease Outcome: Progressing Per patient shortness of breath with activity has improved.

## 2015-09-11 NOTE — Progress Notes (Signed)
PATIENT DETAILS Name: Amber Medina Age: 64 y.o. Sex: female Date of Birth: 10/19/1950 Admit Date: 09/06/2015 Admitting Physician Reubin Milan, MD PCP:No primary care provider on file.  Brief narrative: 64 year old morbidly obese female presented to the hospital with worsening shortness of breath and lower extremity edema. In the emergency room, patient was found to have A. fib with RVR. Started on IV Cardizem, IV Lasix and admitted for further evaluation and treatment. Hospital course has been complicated by development of episodes of apnea and desaturation along with bradycardia/pauses.   Subjective: SOB better-leg edema decreasing  Assessment/Plan: Acute systolic heart failure along with right-sided heart failure: Much more compensated, continue Lasix to 60 mg IV every 8 hours. Weight down to 330 pounds (357 pounds on admission), negative balance of 19.1 L. Still with some volume on board.Suspect left ventricular failure worsened by A. fib RVR, right-sided heart failure likely secondary to pulmonary hypertension/obesity. Cardiology following.  A. fib RVR: Rate better controlled. Initially on IV Cardizem and IV heparin. Has now been transitioned to oral cardizem and Xarelto. Cards following and assisting in rate control. CHADS2VASC score is 2   History of bradycardia/sinus pauses with hypoxemia: Suspect secondary to OSA.EP consulted and felt not to require PPM or further work up. PCCM consulted-CPAP QHS & PRN Sleep. Will need Assess for ambulatory O2 needs prior to discharge and outpatient sleep study after discharge  OSA/OSH:see above  Acute on chronic hypoxic respiratory failure: Suspect more for chronic issue secondary to above. Oxygen as needed, nocturnal O2 saturation recommended by PCCM  Morbid obesity: Counseled regarding importance of weight loss  Mild multinodular goiter, with a dominant 2.2 cm left thyroid lobe nodule: Incidental finding on CT chest. Thyroid  ultrasound when more stable-suspect could be done as an outpatient   Low-density Lesion in the left occipital lobe on CT head: CT head report this area is either subacute CVA or encephalomalacia, suspect the latter as no strokelike symptoms and complete nonfocal exam. MRI brain ordered for further delineation-however patient is claustrophobic and not keen on getting it. Carotid Doppler negative for stenosis, echocardiogram showed mild systolic dysfunction-is already on anticoagulation for atrial fibrillation. Suspect even if this is a small CVA-MRI brain will not change overall management. A1c 5.9, check LDL.  Disposition: Remain inpatient  Antimicrobial agents  See below  Anti-infectives    None      DVT Prophylaxis: Xarelto  Code Status: Full code   Family Communication None at bedside  Procedures: None  CONSULTS:  cardiology and pulmonary/intensive care  Time spent 25 minutes-Greater than 50% of this time was spent in counseling, explanation of diagnosis, planning of further management, and coordination of care.  MEDICATIONS: Scheduled Meds: . digoxin  0.125 mg Intravenous Daily  . diltiazem  90 mg Oral 4 times per day  . furosemide  60 mg Intravenous Q8H  . lisinopril  5 mg Oral Daily  . potassium chloride  40 mEq Oral BID  . rivaroxaban  20 mg Oral Q supper   Continuous Infusions:   PRN Meds:.ALPRAZolam, iohexol    PHYSICAL EXAM: Vital signs in last 24 hours: Filed Vitals:   09/11/15 0301 09/11/15 0548 09/11/15 0625 09/11/15 0725  BP:   96/61   Pulse:   104   Temp:  98.5 F (36.9 C)  98.7 F (37.1 C)  TempSrc:  Oral  Oral  Resp:   17   Height:  Weight: 149.687 kg (330 lb)     SpO2:   93%     Weight change: -1.95 kg (-4 lb 4.8 oz) Filed Weights   09/09/15 0436 09/10/15 0500 09/11/15 0301  Weight: 153.5 kg (338 lb 6.5 oz) 151.637 kg (334 lb 4.8 oz) 149.687 kg (330 lb)   Body mass index is 51.67 kg/(m^2).   Gen Exam: Awake and alert with  clear speech.   Neck: Supple, No JVD.   Chest: B/L Clear.   CVS: S1 S2 irregular-tachy, no murmurs.  Abdomen: soft, BS +, non tender, non distended.  Extremities: + edema, lower extremities warm to touch. Neurologic: Non Focal.   Skin: No Rash.   Wounds: N/A.   Intake/Output from previous day:  Intake/Output Summary (Last 24 hours) at 09/11/15 0959 Last data filed at 09/11/15 0958  Gross per 24 hour  Intake   1440 ml  Output   5050 ml  Net  -3610 ml     LAB RESULTS: CBC  Recent Labs Lab 09/06/15 1839 09/07/15 0655 09/08/15 0953 09/09/15 0414 09/10/15 0500 09/11/15 0454  WBC 9.2 10.0 9.4 9.0 8.9 8.0  HGB 15.2* 14.3 14.2 14.4 14.6 14.5  HCT 48.9* 45.2 46.9* 47.0* 46.4* 46.2*  PLT 259 227 233 225 211 210  MCV 92.8 91.3 93.1 92.7 91.0 91.3  MCH 28.8 28.9 28.2 28.4 28.6 28.7  MCHC 31.1 31.6 30.3 30.6 31.5 31.4  RDW 14.5 14.5 14.7 14.4 14.2 14.2  LYMPHSABS 1.5 1.1  --   --   --   --   MONOABS 0.9 0.9  --   --   --   --   EOSABS 0.1 0.1  --   --   --   --   BASOSABS 0.0 0.0  --   --   --   --     Chemistries   Recent Labs Lab 09/06/15 2011  09/07/15 0655 09/08/15 0953 09/09/15 0414 09/10/15 0500 09/11/15 0454  NA  --   < > 143 141 144 139 138  K  --   < > 3.8 4.2 4.4 4.1 4.4  CL  --   < > 107 104 98* 95* 95*  CO2  --   < > 26 26 34* 33* 33*  GLUCOSE  --   < > 122* 135* 94 90 116*  BUN  --   < > 19 19 17 17  24*  CREATININE  --   < > 1.18* 1.11* 1.12* 1.01* 1.12*  CALCIUM  --   < > 8.9 9.3 9.6 9.2 9.2  MG 1.8  --   --   --   --   --   --   < > = values in this interval not displayed.  CBG:  Recent Labs Lab 09/07/15 0507  GLUCAP 188*    GFR Estimated Creatinine Clearance: 75.5 mL/min (by C-G formula based on Cr of 1.12).  Coagulation profile  Recent Labs Lab 09/06/15 1839  INR 1.22    Cardiac Enzymes  Recent Labs Lab 09/07/15 0150 09/07/15 0655  TROPONINI 0.03 0.04*    Invalid input(s): POCBNP No results for input(s): DDIMER in the  last 72 hours. No results for input(s): HGBA1C in the last 72 hours.  Recent Labs  09/09/15 0414  CHOL 155  HDL 33*  LDLCALC 99  TRIG 116  CHOLHDL 4.7   No results for input(s): TSH, T4TOTAL, T3FREE, THYROIDAB in the last 72 hours.  Invalid input(s): FREET3 No results for input(s):  VITAMINB12, FOLATE, FERRITIN, TIBC, IRON, RETICCTPCT in the last 72 hours. No results for input(s): LIPASE, AMYLASE in the last 72 hours.  Urine Studies No results for input(s): UHGB, CRYS in the last 72 hours.  Invalid input(s): UACOL, UAPR, USPG, UPH, UTP, UGL, UKET, UBIL, UNIT, UROB, ULEU, UEPI, UWBC, URBC, UBAC, CAST, UCOM, BILUA  MICROBIOLOGY: Recent Results (from the past 240 hour(s))  MRSA PCR Screening     Status: None   Collection Time: 09/07/15 12:12 AM  Result Value Ref Range Status   MRSA by PCR NEGATIVE NEGATIVE Final    Comment:        The GeneXpert MRSA Assay (FDA approved for NASAL specimens only), is one component of a comprehensive MRSA colonization surveillance program. It is not intended to diagnose MRSA infection nor to guide or monitor treatment for MRSA infections.     RADIOLOGY STUDIES/RESULTS: Ct Head Wo Contrast  09/07/2015  CLINICAL DATA:  64 year old with shortness of breath and syncopal episode. History of atrial fibrillation. Initial encounter. EXAM: CT HEAD WITHOUT CONTRAST TECHNIQUE: Contiguous axial images were obtained from the base of the skull through the vertex without intravenous contrast. COMPARISON:  None. FINDINGS: There is no evidence of acute intracranial hemorrhage, mass lesion, brain edema or extra-axial fluid collection. The ventricles and subarachnoid spaces are appropriately sized for age. There is questionable low-density medially in the left occipital lobe on images 18 and 19 which could reflect encephalomalacia or a subacute infarct. There is minimal patchy periventricular white matter disease, likely due to chronic small vessel ischemic change.  Intracranial atherosclerosis noted. Mild anterior left ethmoid sinus opacification. The visualized paranasal sinuses, mastoid air cells and middle ears are otherwise clear. The calvarium is intact. IMPRESSION: 1. Potential subacute subcortical infarct or encephalomalacia in the left occipital lobe. 2. No evidence of acute intracranial hemorrhage, midline shift or hydrocephalus. Electronically Signed   By: Richardean Sale M.D.   On: 09/07/2015 15:51   Ct Angio Chest Pe W/cm &/or Wo Cm  09/07/2015  CLINICAL DATA:  Shortness of breath. Atrial fibrillation. Syncopal episode. Dizziness. EXAM: CT ANGIOGRAPHY CHEST WITH CONTRAST TECHNIQUE: Multidetector CT imaging of the chest was performed using the standard protocol during bolus administration of intravenous contrast. Multiplanar CT image reconstructions and MIPs were obtained to evaluate the vascular anatomy. CONTRAST:  174mL OMNIPAQUE IOHEXOL 300 MG/ML  SOLN COMPARISON:  Chest radiograph from earlier today. FINDINGS: Mediastinum/Nodes: The study is moderate quality for the evaluation of pulmonary embolism, with evaluation of the subsegmental pulmonary arteries limited by motion artifact. There are no filling defects in the central, lobar or segmental pulmonary artery branches to suggest acute pulmonary embolism. Great vessels are normal in course and caliber. Mild cardiomegaly. No pericardial fluid/thickening. Left anterior descending coronary atherosclerosis. There is a mild multinodular goiter asymmetrically involving the left thyroid lobe, including a partially calcified 1.5 cm posterior upper left thyroid lobe nodule and a hypodense 2.2 cm lower left thyroid lobe nodule. Normal esophagus. No axillary adenopathy. There are a few top-normal size right paratracheal nodes and right hilar nodes containing coarse internal calcifications, in keeping with prior granulomatous disease. Otherwise no mediastinal or hilar adenopathy. Lungs/Pleura: No pneumothorax. Trace  layering right pleural effusion. No left pleural effusion. Coarsely calcified subcentimeter granuloma in the anterior right upper lobe. No acute consolidative airspace disease, significant pulmonary nodules or lung masses. There is mild passive atelectasis in the dependent right lower lobe and lingula. There is a faint mosaic attenuation throughout both lungs with scattered mild interlobular septal  thickening, most suggestive of mild pulmonary edema. Upper abdomen: Unremarkable. Musculoskeletal: No aggressive appearing focal osseous lesions. Mild degenerative changes in the thoracic spine. Review of the MIP images confirms the above findings. IMPRESSION: 1. No evidence of pulmonary embolism. 2. Mild cardiomegaly. Trace right pleural effusion. Mild pulmonary edema. Findings are most in keeping with mild congestive heart failure. 3. Mild multinodular goiter, with a dominant 2.2 cm left thyroid lobe nodule. Recommend short-term outpatient correlation with thyroid ultrasound. This follows ACR consensus guidelines: Managing Incidental Thyroid Nodules Detected on Imaging: White Paper of the ACR Incidental Thyroid Findings Committee. J Am Coll Radiol 2015; 12:143-150. Electronically Signed   By: Ilona Sorrel M.D.   On: 09/07/2015 15:51   Dg Chest Port 1 View  09/06/2015  CLINICAL DATA:  64 year old female with shortness of breath 1 week ago, and swelling in the feet and legs over the last month. EXAM: PORTABLE CHEST 1 VIEW COMPARISON:  No priors. FINDINGS: There is cephalization of the pulmonary vasculature and slight indistinctness of the interstitial markings suggestive of mild pulmonary edema. Mild cardiomegaly. No definite pleural effusions. Upper mediastinal contours are within normal limits. IMPRESSION: 1. The appearance the chest suggests mild congestive heart failure, as above. Electronically Signed   By: Vinnie Langton M.D.   On: 09/06/2015 18:51    Oren Binet, MD  [facility] Pager:[phone]  [phone]  If 7PM-7AM, please contact night-coverage [email or web address] Password [email or web address] 09/11/2015, 9:59 AM   LOS: 5 days

## 2015-09-12 DIAGNOSIS — G4733 Obstructive sleep apnea (adult) (pediatric): Secondary | ICD-10-CM

## 2015-09-12 LAB — BASIC METABOLIC PANEL
ANION GAP: 12 (ref 5–15)
BUN: 27 mg/dL — ABNORMAL HIGH (ref 6–20)
CALCIUM: 9.6 mg/dL (ref 8.9–10.3)
CO2: 35 mmol/L — ABNORMAL HIGH (ref 22–32)
CREATININE: 1.41 mg/dL — AB (ref 0.44–1.00)
Chloride: 89 mmol/L — ABNORMAL LOW (ref 101–111)
GFR, EST AFRICAN AMERICAN: 44 mL/min — AB (ref >60)
GFR, EST NON AFRICAN AMERICAN: 38 mL/min — AB (ref >60)
Glucose, Bld: 114 mg/dL — ABNORMAL HIGH (ref 65–99)
Potassium: 4.5 mmol/L (ref 3.5–5.1)
SODIUM: 136 mmol/L (ref 135–145)

## 2015-09-12 LAB — CBC
HCT: 49.4 % — ABNORMAL HIGH (ref 36.0–46.0)
Hemoglobin: 15.3 g/dL — ABNORMAL HIGH (ref 12.0–15.0)
MCH: 28.4 pg (ref 26.0–34.0)
MCHC: 31 g/dL (ref 30.0–36.0)
MCV: 91.8 fL (ref 78.0–100.0)
PLATELETS: 250 10*3/uL (ref 150–400)
RBC: 5.38 MIL/uL — ABNORMAL HIGH (ref 3.87–5.11)
RDW: 14.4 % (ref 11.5–15.5)
WBC: 8.7 10*3/uL (ref 4.0–10.5)

## 2015-09-12 MED ORDER — FUROSEMIDE 10 MG/ML IJ SOLN: 40.0000 mg | Freq: Two times a day (BID) | INTRAMUSCULAR | Status: DC

## 2015-09-12 NOTE — Progress Notes (Signed)
Name: Amber Medina MRN: [id number] DOB: 1951-10-01    ADMISSION DATE:  09/06/2015 CONSULTATION DATE:  09/08/15  REFERRING MD :  Dr. Sloan Leiter   CHIEF COMPLAINT:  Bradycardia / Hypoxemia, concern for sleep apnea   BRIEF PATIENT DESCRIPTION: 64 y/o F, former smoker, with PMH of obesity & HTN (not on meds prior to current admit) who was admitted 12/1 for increasing SOB.  The patient was later noted to have periods of apnea, bradycardia (once on toilet, another while sleeping), & hypoxemia.  She also was noted to have AF with RVR and CXR with mild CHF.  PCCM consulted for evaluation of hypoxemia / concern for sleep apnea.    SIGNIFICANT EVENTS  12/01  Admit for increasing SOB 12/03  PCCM consulted for hypoxemia & concern for OSA 12/05  Pt with multiple c/o's regarding CPAP.  States not really able to wear  12/07  Remains in AF  STUDIES:  12/02  Carotid Doppler >> vertebral arteries appear patent, 39% stenosis involving the R ICA and the L ICA 12/02  ECHO >> R heart enlargement, reduced RV systolic function, PA peak 36 12/02  CT Head >> potential subacute subcortical infarct or encephalomalacia in the L occipital lobe, no acute findings 12/02  CTA Chest >> no PE, mild cardiomegaly, trace R effusion, mild pulmonary edema, mild multinodular goiter   SUBJECTIVE:  Pt reports she wore CPAP approx 2 hours overnight.  Tolerated better last PM.  Remains in AF.  Cardiology considering cardioversion later in week if she does not convert with amio / cardizem   VITAL SIGNS: Temp:  [97.5 F (36.4 C)-99.1 F (37.3 C)] 97.5 F (36.4 C) (12/07 0749) Pulse Rate:  [80-97] 97 (12/07 0749) Resp:  [16-18] 18 (12/07 0319) BP: (87-118)/(48-92) 98/71 mmHg (12/07 0749) SpO2:  [97 %-98 %] 97 % (12/07 0749) Weight:  [328 lb 1.6 oz (148.825 kg)] 328 lb 1.6 oz (148.825 kg) (12/07 0319)  PHYSICAL EXAMINATION: General:  Morbidly obese female in NAD, sitting up on side of bed playing games on computer  Neuro:   AAOx4, speech clear, pleasant, MAE, normal strength HEENT:  MM pink/moist, unable to assess JVD, MM pink/moist, good dentition  Cardiovascular:  s1s2 irr irr, no m/r/g, AF on monitor Lungs:  Even/non-labored, lungs bilaterally distant but clear  Abdomen:  Obese, soft, bsx4 active  Musculoskeletal:  No acute deformities  Skin: 1+ LE edema in BLE   Recent Labs Lab 09/10/15 0500 09/11/15 0454 09/12/15 0326  NA 139 138 136  K 4.1 4.4 4.5  CL 95* 95* 89*  CO2 33* 33* 35*  BUN 17 24* 27*  CREATININE 1.01* 1.12* 1.41*  GLUCOSE 90 116* 114*    Recent Labs Lab 09/10/15 0500 09/11/15 0454 09/12/15 0326  HGB 14.6 14.5 15.3*  HCT 46.4* 46.2* 49.4*  WBC 8.9 8.0 8.7  PLT 211 210 250   No results found.  ASSESSMENT / PLAN:  Dyspnea - suspect multifactorial in the setting of morbid obesity, atrial fibrillation with RVR, PAH Secondary PAH - noted on ECHO 12/2 OSA / OHS - suspected OSA / OHS, no formal sleep study.  Epworth Sleepiness Scale of 12 Probable COPD, severity unclear  Plan:  Oxygen as needed to support saturations > 92% Assess for ambulatory O2 needs prior to discharge. She will likely need O2 at rest and w exertion.  CPAP QHS & PRN Sleep. She may qualify for NIPPV at home at discharge based on her hypoventilation and pCO2, but suspect she will  need documentation on PSG to obtain the device. Could inquire re: requirements with [facility].  She would like to use nasal pillows once evaluated (Case manager working on sleep study set up) Sleep MD evaluation arranged - see d/c section  Will need appt for sleep study prior to Sleep MD evaluation.  Depending on insurance, might be able to do an overnight sleep study at home.      Atrial Fibrillation with RVR  Decompensated sCHF - net neg 19L during admit, admit weight 357 lbs, down to 330 Hypertension Bradycardia - resolved.  Anasarca   Plan: Note possible cardioversion per Dr Thompson Caul notes later in week Diuresis as renal  function / BP permit  Xarelto per pharmacy   Abnormal CT Head - pt has refused MRI at this point  Plan: Further follow up per primary SVC    Noe Gens, NP-C [facility] Pgr: [phone] or if no answer [phone] 09/12/2015, 9:38 AM    Attending Note:  I have examined patient, reviewed labs, studies and notes. I have discussed the case with B Ollis, and I agree with the data and plans as amended above.  Baltazar Apo, MD, PhD 09/12/2015, 11:47 AM [facility] [phone] or if no answer [phone]

## 2015-09-12 NOTE — Progress Notes (Signed)
       Patient Name: Amber Medina Date of Encounter: 09/12/2015    SUBJECTIVE: The patient feels better. She had a good night sleep. Orthopnea has improved.  TELEMETRY:  Atrial fibrillation with moderate rate control at rest but poorly controlled with activity. Filed Vitals:   09/12/15 0410 09/12/15 0511 09/12/15 0530 09/12/15 0749  BP: 108/65 97/63 93/48  98/71  Pulse:    97  Temp:    97.5 F (36.4 C)  TempSrc:    Oral  Resp:      Height:      Weight:      SpO2:    97%    Intake/Output Summary (Last 24 hours) at 09/12/15 0954 Last data filed at 09/12/15 0830  Gross per 24 hour  Intake 3976.75 ml  Output   3701 ml  Net 275.75 ml   LABS: Basic Metabolic Panel:  Recent Labs  09/11/15 0454 09/12/15 0326  NA 138 136  K 4.4 4.5  CL 95* 89*  CO2 33* 35*  GLUCOSE 116* 114*  BUN 24* 27*  CREATININE 1.12* 1.41*  CALCIUM 9.2 9.6   CBC:  Recent Labs  09/11/15 0454 09/12/15 0326  WBC 8.0 8.7  HGB 14.5 15.3*  HCT 46.2* 49.4*  MCV 91.3 91.8  PLT 210 250     Radiology/Studies:  No new data  Physical Exam: Blood pressure 98/71, pulse 97, temperature 97.5 F (36.4 C), temperature source Oral, resp. rate 18, height 5\' 7"  (1.702 m), weight 328 lb 1.6 oz (148.825 kg), SpO2 97 %. Weight change: -1 lb 14.4 oz (-0.862 kg)  Wt Readings from Last 3 Encounters:  09/12/15 328 lb 1.6 oz (148.825 kg)    Obese Lungs clear Veins flat Peripheral edema resolved.  ASSESSMENT:  1. Atrial fibrillation with rapid ventricular response, difficulty with rate control due to soft blood pressures and inability to escalate therapy. 2. Morbid obesity and likely sleep apnea 3. Acute kidney injury secondary to diuresis 4. Acute on chronic diastolic heart failure, currently euvolemic. 5. Recently started amiodarone therapy with plan for TEE cardioversion  Plan:  1. Discontinue diuretic therapy 2. Continue IV amiodarone 3. TEE cardioversion scheduled for tomorrow 4. Nothing by  mouth after midnight   Signed, Sinclair Grooms 09/12/2015, 9:54 AM

## 2015-09-12 NOTE — Progress Notes (Signed)
PATIENT DETAILS Name: Amber Medina Age: 64 y.o. Sex: female Date of Birth: [birth year] Admit Date: 09/06/2015 Admitting Physician Reubin Milan, MD PCP:No primary care provider on file.  Brief narrative: 64 year old morbidly obese female presented to the hospital with worsening shortness of breath and lower extremity edema. In the emergency room, patient was found to have A. fib with RVR. Started on IV Cardizem, IV Lasix and admitted for further evaluation and treatment. Hospital course has been complicated by development of episodes of apnea and desaturation along with bradycardia/pauses.   Subjective: SOB better-leg edema decreasing. Used bipap for 2 hours last night  Assessment/Plan: Acute systolic heart failure along with right-sided heart failure: Compensated, managed with IV Lasix-given increase in creatinine-Lasix now d/c'd. .Weight down to 328 pounds (357 pounds on admission), negative balance of 17L. Check lytes in am-and plan on starting oral diuretics.Suspect left ventricular failure worsened by A. fib RVR, right-sided heart failure likely secondary to pulmonary hypertension/obesity. Cardiology following.  A. fib RVR: Rate better controlled. Initially on IV Cardizem and IV heparin. Because of difficulty with rate control in a setting of Lsoft BP-started on Amiodarone. Continue Xarelto. Cards following and planning on cardioversion tomorrow. CHADS2VASC score is 2   History of bradycardia/sinus pauses with hypoxemia: Suspect secondary to OSA.EP consulted and felt not to require PPM or further work up. PCCM consulted-CPAP QHS & PRN Sleep. Will need Assess for ambulatory O2 needs prior to discharge and outpatient sleep study after discharge  OSA/OSH:see above  Acute on chronic hypoxic respiratory failure: Suspect more for chronic issue secondary to above. Oxygen as needed, nocturnal O2 saturation recommended by PCCM  Morbid obesity: Counseled regarding importance of  weight loss  Mild multinodular goiter, with a dominant 2.2 cm left thyroid lobe nodule: Incidental finding on CT chest. Thyroid ultrasound when more stable-suspect could be done as an outpatient   Low-density Lesion in the left occipital lobe on CT head: CT head report this area is either subacute CVA or encephalomalacia, suspect the latter as no strokelike symptoms and complete nonfocal exam. MRI brain ordered for further delineation-however patient is claustrophobic and not keen on getting it. Carotid Doppler negative for stenosis, echocardiogram showed mild systolic dysfunction-is already on anticoagulation for atrial fibrillation. Suspect even if this is a small CVA-MRI brain will not change overall management. A1c 5.9, check LDL.  Disposition: Remain inpatient-home later this week  Antimicrobial agents  See below  Anti-infectives    None      DVT Prophylaxis: Xarelto  Code Status: Full code   Family Communication None at bedside  Procedures: None  CONSULTS:  cardiology and pulmonary/intensive care  Time spent 25 minutes-Greater than 50% of this time was spent in counseling, explanation of diagnosis, planning of further management, and coordination of care.  MEDICATIONS: Scheduled Meds: . diltiazem  90 mg Oral 4 times per day  . potassium chloride  40 mEq Oral BID  . rivaroxaban  20 mg Oral Q supper   Continuous Infusions: . sodium chloride    . amiodarone 30 mg/hr (09/12/15 0419)   PRN Meds:.ALPRAZolam, iohexol    PHYSICAL EXAM: Vital signs in last 24 hours: Filed Vitals:   09/12/15 0511 09/12/15 0530 09/12/15 0749 09/12/15 1204  BP: 97/63 93/48 98/71  119/64  Pulse:   97 97  Temp:   97.5 F (36.4 C) 98.3 F (36.8 C)  TempSrc:   Oral Oral  Resp:  18  Height:      Weight:      SpO2:   97% 97%    Weight change: -0.862 kg (-1 lb 14.4 oz) Filed Weights   09/10/15 0500 09/11/15 0301 09/12/15 0319  Weight: 151.637 kg (334 lb 4.8 oz) 149.687 kg (330  lb) 148.825 kg (328 lb 1.6 oz)   Body mass index is 51.38 kg/(m^2).   Gen Exam: Awake and alert with clear speech.   Neck: Supple, No JVD.   Chest: B/L Clear.   CVS: S1 S2 irregular-tachy, no murmurs.  Abdomen: soft, BS +, non tender, non distended.  Extremities: + edema, lower extremities warm to touch. Neurologic: Non Focal.   Skin: No Rash.   Wounds: N/A.   Intake/Output from previous day:  Intake/Output Summary (Last 24 hours) at 09/12/15 1259 Last data filed at 09/12/15 1100  Gross per 24 hour  Intake   5232 ml  Output   3251 ml  Net   1981 ml     LAB RESULTS: CBC  Recent Labs Lab 09/06/15 1839 09/07/15 0655 09/08/15 0953 09/09/15 0414 09/10/15 0500 09/11/15 0454 09/12/15 0326  WBC 9.2 10.0 9.4 9.0 8.9 8.0 8.7  HGB 15.2* 14.3 14.2 14.4 14.6 14.5 15.3*  HCT 48.9* 45.2 46.9* 47.0* 46.4* 46.2* 49.4*  PLT 259 227 233 225 211 210 250  MCV 92.8 91.3 93.1 92.7 91.0 91.3 91.8  MCH 28.8 28.9 28.2 28.4 28.6 28.7 28.4  MCHC 31.1 31.6 30.3 30.6 31.5 31.4 31.0  RDW 14.5 14.5 14.7 14.4 14.2 14.2 14.4  LYMPHSABS 1.5 1.1  --   --   --   --   --   MONOABS 0.9 0.9  --   --   --   --   --   EOSABS 0.1 0.1  --   --   --   --   --   BASOSABS 0.0 0.0  --   --   --   --   --     Chemistries   Recent Labs Lab 09/06/15 2011  09/08/15 0953 09/09/15 0414 09/10/15 0500 09/11/15 0454 09/12/15 0326  NA  --   < > 141 144 139 138 136  K  --   < > 4.2 4.4 4.1 4.4 4.5  CL  --   < > 104 98* 95* 95* 89*  CO2  --   < > 26 34* 33* 33* 35*  GLUCOSE  --   < > 135* 94 90 116* 114*  BUN  --   < > 19 17 17  24* 27*  CREATININE  --   < > 1.11* 1.12* 1.01* 1.12* 1.41*  CALCIUM  --   < > 9.3 9.6 9.2 9.2 9.6  MG 1.8  --   --   --   --   --   --   < > = values in this interval not displayed.  CBG:  Recent Labs Lab 09/07/15 0507  GLUCAP 188*    GFR Estimated Creatinine Clearance: 59.8 mL/min (by C-G formula based on Cr of 1.41).  Coagulation profile  Recent Labs Lab  09/06/15 1839  INR 1.22    Cardiac Enzymes  Recent Labs Lab 09/07/15 0150 09/07/15 0655  TROPONINI 0.03 0.04*    Invalid input(s): POCBNP No results for input(s): DDIMER in the last 72 hours. No results for input(s): HGBA1C in the last 72 hours. No results for input(s): CHOL, HDL, LDLCALC, TRIG, CHOLHDL, LDLDIRECT in the last 72 hours. No  results for input(s): TSH, T4TOTAL, T3FREE, THYROIDAB in the last 72 hours.  Invalid input(s): FREET3 No results for input(s): VITAMINB12, FOLATE, FERRITIN, TIBC, IRON, RETICCTPCT in the last 72 hours. No results for input(s): LIPASE, AMYLASE in the last 72 hours.  Urine Studies No results for input(s): UHGB, CRYS in the last 72 hours.  Invalid input(s): UACOL, UAPR, USPG, UPH, UTP, UGL, UKET, UBIL, UNIT, UROB, ULEU, UEPI, UWBC, URBC, UBAC, CAST, UCOM, BILUA  MICROBIOLOGY: Recent Results (from the past 240 hour(s))  MRSA PCR Screening     Status: None   Collection Time: 09/07/15 12:12 AM  Result Value Ref Range Status   MRSA by PCR NEGATIVE NEGATIVE Final    Comment:        The GeneXpert MRSA Assay (FDA approved for NASAL specimens only), is one component of a comprehensive MRSA colonization surveillance program. It is not intended to diagnose MRSA infection nor to guide or monitor treatment for MRSA infections.     RADIOLOGY STUDIES/RESULTS: Ct Head Wo Contrast  09/07/2015  CLINICAL DATA:  64 year old with shortness of breath and syncopal episode. History of atrial fibrillation. Initial encounter. EXAM: CT HEAD WITHOUT CONTRAST TECHNIQUE: Contiguous axial images were obtained from the base of the skull through the vertex without intravenous contrast. COMPARISON:  None. FINDINGS: There is no evidence of acute intracranial hemorrhage, mass lesion, brain edema or extra-axial fluid collection. The ventricles and subarachnoid spaces are appropriately sized for age. There is questionable low-density medially in the left occipital lobe on  images 18 and 19 which could reflect encephalomalacia or a subacute infarct. There is minimal patchy periventricular white matter disease, likely due to chronic small vessel ischemic change. Intracranial atherosclerosis noted. Mild anterior left ethmoid sinus opacification. The visualized paranasal sinuses, mastoid air cells and middle ears are otherwise clear. The calvarium is intact. IMPRESSION: 1. Potential subacute subcortical infarct or encephalomalacia in the left occipital lobe. 2. No evidence of acute intracranial hemorrhage, midline shift or hydrocephalus. Electronically Signed   By: Richardean Sale M.D.   On: 09/07/2015 15:51   Ct Angio Chest Pe W/cm &/or Wo Cm  09/07/2015  CLINICAL DATA:  Shortness of breath. Atrial fibrillation. Syncopal episode. Dizziness. EXAM: CT ANGIOGRAPHY CHEST WITH CONTRAST TECHNIQUE: Multidetector CT imaging of the chest was performed using the standard protocol during bolus administration of intravenous contrast. Multiplanar CT image reconstructions and MIPs were obtained to evaluate the vascular anatomy. CONTRAST:  175mL OMNIPAQUE IOHEXOL 300 MG/ML  SOLN COMPARISON:  Chest radiograph from earlier today. FINDINGS: Mediastinum/Nodes: The study is moderate quality for the evaluation of pulmonary embolism, with evaluation of the subsegmental pulmonary arteries limited by motion artifact. There are no filling defects in the central, lobar or segmental pulmonary artery branches to suggest acute pulmonary embolism. Great vessels are normal in course and caliber. Mild cardiomegaly. No pericardial fluid/thickening. Left anterior descending coronary atherosclerosis. There is a mild multinodular goiter asymmetrically involving the left thyroid lobe, including a partially calcified 1.5 cm posterior upper left thyroid lobe nodule and a hypodense 2.2 cm lower left thyroid lobe nodule. Normal esophagus. No axillary adenopathy. There are a few top-normal size right paratracheal nodes and  right hilar nodes containing coarse internal calcifications, in keeping with prior granulomatous disease. Otherwise no mediastinal or hilar adenopathy. Lungs/Pleura: No pneumothorax. Trace layering right pleural effusion. No left pleural effusion. Coarsely calcified subcentimeter granuloma in the anterior right upper lobe. No acute consolidative airspace disease, significant pulmonary nodules or lung masses. There is mild passive atelectasis in the  dependent right lower lobe and lingula. There is a faint mosaic attenuation throughout both lungs with scattered mild interlobular septal thickening, most suggestive of mild pulmonary edema. Upper abdomen: Unremarkable. Musculoskeletal: No aggressive appearing focal osseous lesions. Mild degenerative changes in the thoracic spine. Review of the MIP images confirms the above findings. IMPRESSION: 1. No evidence of pulmonary embolism. 2. Mild cardiomegaly. Trace right pleural effusion. Mild pulmonary edema. Findings are most in keeping with mild congestive heart failure. 3. Mild multinodular goiter, with a dominant 2.2 cm left thyroid lobe nodule. Recommend short-term outpatient correlation with thyroid ultrasound. This follows ACR consensus guidelines: Managing Incidental Thyroid Nodules Detected on Imaging: White Paper of the ACR Incidental Thyroid Findings Committee. J Am Coll Radiol 2015; 12:143-150. Electronically Signed   By: Ilona Sorrel M.D.   On: 09/07/2015 15:51   Dg Chest Port 1 View  09/06/2015  CLINICAL DATA:  64 year old female with shortness of breath 1 week ago, and swelling in the feet and legs over the last month. EXAM: PORTABLE CHEST 1 VIEW COMPARISON:  No priors. FINDINGS: There is cephalization of the pulmonary vasculature and slight indistinctness of the interstitial markings suggestive of mild pulmonary edema. Mild cardiomegaly. No definite pleural effusions. Upper mediastinal contours are within normal limits. IMPRESSION: 1. The appearance the  chest suggests mild congestive heart failure, as above. Electronically Signed   By: Vinnie Langton M.D.   On: 09/06/2015 18:51    Oren Binet, MD  [facility] Pager:[phone]  If 7PM-7AM, please contact night-coverage [email or web address] Password [email or web address] 09/12/2015, 12:59 PM   LOS: 6 days

## 2015-09-13 DIAGNOSIS — I272 Other secondary pulmonary hypertension: Secondary | ICD-10-CM

## 2015-09-13 LAB — BASIC METABOLIC PANEL
ANION GAP: 10 (ref 5–15)
BUN: 20 mg/dL (ref 6–20)
CO2: 29 mmol/L (ref 22–32)
Calcium: 9.5 mg/dL (ref 8.9–10.3)
Chloride: 98 mmol/L — ABNORMAL LOW (ref 101–111)
Creatinine, Ser: 1.12 mg/dL — ABNORMAL HIGH (ref 0.44–1.00)
GFR calc Af Amer: 58 mL/min — ABNORMAL LOW (ref >60)
GFR, EST NON AFRICAN AMERICAN: 50 mL/min — AB (ref >60)
Glucose, Bld: 128 mg/dL — ABNORMAL HIGH (ref 65–99)
POTASSIUM: 5.1 mmol/L (ref 3.5–5.1)
SODIUM: 137 mmol/L (ref 135–145)

## 2015-09-13 LAB — CBC
HEMATOCRIT: 46.8 % — AB (ref 36.0–46.0)
Hemoglobin: 14.6 g/dL (ref 12.0–15.0)
MCH: 28.3 pg (ref 26.0–34.0)
MCHC: 31.2 g/dL (ref 30.0–36.0)
MCV: 90.9 fL (ref 78.0–100.0)
PLATELETS: 215 10*3/uL (ref 150–400)
RBC: 5.15 MIL/uL — ABNORMAL HIGH (ref 3.87–5.11)
RDW: 14.4 % (ref 11.5–15.5)
WBC: 9 10*3/uL (ref 4.0–10.5)

## 2015-09-13 MED ORDER — SALINE SPRAY 0.65 % NA SOLN
1.0000 | NASAL | Status: DC | PRN
  Administered 2015-09-13: 1 via NASAL
  Filled 2015-09-13: qty 44

## 2015-09-13 MED ORDER — FUROSEMIDE 40 MG PO TABS
40.0000 mg | ORAL_TABLET | Freq: Two times a day (BID) | ORAL | Status: DC
  Administered 2015-09-13 – 2015-09-15 (×4): 40 mg via ORAL
  Filled 2015-09-13 (×4): qty 1

## 2015-09-13 MED ORDER — POTASSIUM CHLORIDE CRYS ER 20 MEQ PO TBCR
20.0000 meq | EXTENDED_RELEASE_TABLET | Freq: Two times a day (BID) | ORAL | Status: DC
  Administered 2015-09-13 – 2015-09-15 (×4): 20 meq via ORAL
  Filled 2015-09-13 (×4): qty 1

## 2015-09-13 NOTE — Progress Notes (Signed)
PCCM Interval Note  Pt has PSG arranged and OV with Dr Baird Lyons scheduled. Unfortunately has had difficulty doing CPAP here. Hopefully compliance will improve when she has a better mask. Control of her OSA will be key to avoiding recurrence of her A Fib after she's cardioverted, volume overload, etc.   Please call if we can help   Baltazar Apo, MD, PhD 09/13/2015, 9:06 AM  Pulmonary and Critical Care [phone] or if no answer [phone]

## 2015-09-13 NOTE — Progress Notes (Signed)
PATIENT DETAILS Name: Amber Medina Age: 64 y.o. Sex: female Date of Birth: 11-20-1950 Admit Date: 09/06/2015 Admitting Physician Reubin Milan, MD PCP:No primary care provider on file.  Brief narrative: 64 year old morbidly obese female presented to the hospital with worsening shortness of breath and lower extremity edema. In the emergency room, patient was found to have A. fib with RVR. Started on IV Cardizem, IV Lasix and admitted for further evaluation and treatment. Hospital course has been complicated by development of episodes of apnea and desaturation along with bradycardia/pauses.   Subjective: SOB better-leg edema decreasing. Used bipap for 2 hours last night  Assessment/Plan: Acute systolic heart failure along with right-sided heart failure: Compensated, managed with IV Lasix-briefly held due to increase in creatinine-will transition to oral Lasix today and follow weight and I&O's .Weight down to 325 pounds (357 pounds on admission), negative balance of 7L.Suspect left ventricular failure worsened by A. fib RVR, right-sided heart failure likely secondary to pulmonary hypertension/obesity. Cardiology following.  A. fib RVR: Rate better controlled. Initially on IV Cardizem and IV heparin. Because of difficulty with rate control in a setting of soft BP-started on Amiodarone. Continue Xarelto. Cards following and planning on cardioversion 12/9. CHADS2VASC score is 2   History of bradycardia/sinus pauses with hypoxemia: Suspect secondary to OSA.EP consulted and felt not to require PPM or further work up. PCCM consulted-CPAP QHS & PRN Sleep. Will need Assess for ambulatory O2 needs prior to discharge and outpatient sleep study after discharge  OSA/OSH:see above-outpatient sleep study arranged  Acute on chronic hypoxic respiratory failure: Suspect more for chronic issue secondary to above. Oxygen as needed, nocturnal O2 saturation recommended by PCCM  Morbid obesity:  Counseled regarding importance of weight loss  Mild multinodular goiter, with a dominant 2.2 cm left thyroid lobe nodule: Incidental finding on CT chest. Thyroid ultrasound when more stable-suspect could be done as an outpatient   Low-density Lesion in the left occipital lobe on CT head: CT head report this area is either subacute CVA or encephalomalacia, suspect the latter as no strokelike symptoms and complete nonfocal exam. MRI brain ordered for further delineation-however patient is claustrophobic and not keen on getting it. Carotid Doppler negative for stenosis, echocardiogram showed mild systolic dysfunction-is already on anticoagulation for atrial fibrillation. Suspect even if this is a small CVA-MRI brain will not change overall management. A1c 5.9, check LDL.  Disposition: Remain inpatient-home later this week  Antimicrobial agents  See below  Anti-infectives    None      DVT Prophylaxis: Xarelto  Code Status: Full code   Family Communication None at bedside  Procedures: None  CONSULTS:  cardiology and pulmonary/intensive care  Time spent 25 minutes-Greater than 50% of this time was spent in counseling, explanation of diagnosis, planning of further management, and coordination of care.  MEDICATIONS: Scheduled Meds: . diltiazem  90 mg Oral 4 times per day  . furosemide  40 mg Oral BID  . potassium chloride  40 mEq Oral BID  . rivaroxaban  20 mg Oral Q supper   Continuous Infusions: . sodium chloride    . amiodarone 30 mg/hr (09/13/15 0546)   PRN Meds:.ALPRAZolam, iohexol    PHYSICAL EXAM: Vital signs in last 24 hours: Filed Vitals:   09/13/15 0335 09/13/15 0430 09/13/15 0503 09/13/15 0747  BP: 117/73 146/80 146/80 112/70  Pulse: 93     Temp: 99.4 F (37.4 C)   98.6 F (  37 C)  TempSrc: Oral   Oral  Resp: 16     Height:      Weight: 147.827 kg (325 lb 14.4 oz)     SpO2: 95%   94%    Weight change: -0.998 kg (-2 lb 3.2 oz) Filed Weights    09/11/15 0301 09/12/15 0319 09/13/15 0335  Weight: 149.687 kg (330 lb) 148.825 kg (328 lb 1.6 oz) 147.827 kg (325 lb 14.4 oz)   Body mass index is 51.03 kg/(m^2).   Gen Exam: Awake and alert with clear speech.   Neck: Supple, No JVD.   Chest: B/L Clear.   CVS: S1 S2 irregular-tachy, no murmurs.  Abdomen: soft, BS +, non tender, non distended.  Extremities: + edema, lower extremities warm to touch. Neurologic: Non Focal.   Skin: No Rash.   Wounds: N/A.   Intake/Output from previous day:  Intake/Output Summary (Last 24 hours) at 09/13/15 1130 Last data filed at 09/13/15 1000  Gross per 24 hour  Intake 13662.3 ml  Output   1851 ml  Net 11811.3 ml     LAB RESULTS: CBC  Recent Labs Lab 09/06/15 1839 09/07/15 0655  09/09/15 0414 09/10/15 0500 09/11/15 0454 09/12/15 0326 09/13/15 0630  WBC 9.2 10.0  < > 9.0 8.9 8.0 8.7 9.0  HGB 15.2* 14.3  < > 14.4 14.6 14.5 15.3* 14.6  HCT 48.9* 45.2  < > 47.0* 46.4* 46.2* 49.4* 46.8*  PLT 259 227  < > 225 211 210 250 215  MCV 92.8 91.3  < > 92.7 91.0 91.3 91.8 90.9  MCH 28.8 28.9  < > 28.4 28.6 28.7 28.4 28.3  MCHC 31.1 31.6  < > 30.6 31.5 31.4 31.0 31.2  RDW 14.5 14.5  < > 14.4 14.2 14.2 14.4 14.4  LYMPHSABS 1.5 1.1  --   --   --   --   --   --   MONOABS 0.9 0.9  --   --   --   --   --   --   EOSABS 0.1 0.1  --   --   --   --   --   --   BASOSABS 0.0 0.0  --   --   --   --   --   --   < > = values in this interval not displayed.  Chemistries   Recent Labs Lab 09/06/15 2011  09/09/15 0414 09/10/15 0500 09/11/15 0454 09/12/15 0326 09/13/15 0630  NA  --   < > 144 139 138 136 137  K  --   < > 4.4 4.1 4.4 4.5 5.1  CL  --   < > 98* 95* 95* 89* 98*  CO2  --   < > 34* 33* 33* 35* 29  GLUCOSE  --   < > 94 90 116* 114* 128*  BUN  --   < > 17 17 24* 27* 20  CREATININE  --   < > 1.12* 1.01* 1.12* 1.41* 1.12*  CALCIUM  --   < > 9.6 9.2 9.2 9.6 9.5  MG 1.8  --   --   --   --   --   --   < > = values in this interval not  displayed.  CBG:  Recent Labs Lab 09/07/15 0507  GLUCAP 188*    GFR Estimated Creatinine Clearance: 75 mL/min (by C-G formula based on Cr of 1.12).  Coagulation profile  Recent Labs Lab 09/06/15 1839  INR 1.22  Cardiac Enzymes  Recent Labs Lab 09/07/15 0150 09/07/15 0655  TROPONINI 0.03 0.04*    Invalid input(s): POCBNP No results for input(s): DDIMER in the last 72 hours. No results for input(s): HGBA1C in the last 72 hours. No results for input(s): CHOL, HDL, LDLCALC, TRIG, CHOLHDL, LDLDIRECT in the last 72 hours. No results for input(s): TSH, T4TOTAL, T3FREE, THYROIDAB in the last 72 hours.  Invalid input(s): FREET3 No results for input(s): VITAMINB12, FOLATE, FERRITIN, TIBC, IRON, RETICCTPCT in the last 72 hours. No results for input(s): LIPASE, AMYLASE in the last 72 hours.  Urine Studies No results for input(s): UHGB, CRYS in the last 72 hours.  Invalid input(s): UACOL, UAPR, USPG, UPH, UTP, UGL, UKET, UBIL, UNIT, UROB, ULEU, UEPI, UWBC, URBC, UBAC, CAST, UCOM, BILUA  MICROBIOLOGY: Recent Results (from the past 240 hour(s))  MRSA PCR Screening     Status: None   Collection Time: 09/07/15 12:12 AM  Result Value Ref Range Status   MRSA by PCR NEGATIVE NEGATIVE Final    Comment:        The GeneXpert MRSA Assay (FDA approved for NASAL specimens only), is one component of a comprehensive MRSA colonization surveillance program. It is not intended to diagnose MRSA infection nor to guide or monitor treatment for MRSA infections.     RADIOLOGY STUDIES/RESULTS: Ct Head Wo Contrast  09/07/2015  CLINICAL DATA:  64 year old with shortness of breath and syncopal episode. History of atrial fibrillation. Initial encounter. EXAM: CT HEAD WITHOUT CONTRAST TECHNIQUE: Contiguous axial images were obtained from the base of the skull through the vertex without intravenous contrast. COMPARISON:  None. FINDINGS: There is no evidence of acute intracranial hemorrhage,  mass lesion, brain edema or extra-axial fluid collection. The ventricles and subarachnoid spaces are appropriately sized for age. There is questionable low-density medially in the left occipital lobe on images 18 and 19 which could reflect encephalomalacia or a subacute infarct. There is minimal patchy periventricular white matter disease, likely due to chronic small vessel ischemic change. Intracranial atherosclerosis noted. Mild anterior left ethmoid sinus opacification. The visualized paranasal sinuses, mastoid air cells and middle ears are otherwise clear. The calvarium is intact. IMPRESSION: 1. Potential subacute subcortical infarct or encephalomalacia in the left occipital lobe. 2. No evidence of acute intracranial hemorrhage, midline shift or hydrocephalus. Electronically Signed   By: Richardean Sale M.D.   On: 09/07/2015 15:51   Ct Angio Chest Pe W/cm &/or Wo Cm  09/07/2015  CLINICAL DATA:  Shortness of breath. Atrial fibrillation. Syncopal episode. Dizziness. EXAM: CT ANGIOGRAPHY CHEST WITH CONTRAST TECHNIQUE: Multidetector CT imaging of the chest was performed using the standard protocol during bolus administration of intravenous contrast. Multiplanar CT image reconstructions and MIPs were obtained to evaluate the vascular anatomy. CONTRAST:  157mL OMNIPAQUE IOHEXOL 300 MG/ML  SOLN COMPARISON:  Chest radiograph from earlier today. FINDINGS: Mediastinum/Nodes: The study is moderate quality for the evaluation of pulmonary embolism, with evaluation of the subsegmental pulmonary arteries limited by motion artifact. There are no filling defects in the central, lobar or segmental pulmonary artery branches to suggest acute pulmonary embolism. Great vessels are normal in course and caliber. Mild cardiomegaly. No pericardial fluid/thickening. Left anterior descending coronary atherosclerosis. There is a mild multinodular goiter asymmetrically involving the left thyroid lobe, including a partially calcified 1.5  cm posterior upper left thyroid lobe nodule and a hypodense 2.2 cm lower left thyroid lobe nodule. Normal esophagus. No axillary adenopathy. There are a few top-normal size right paratracheal nodes and right hilar nodes  containing coarse internal calcifications, in keeping with prior granulomatous disease. Otherwise no mediastinal or hilar adenopathy. Lungs/Pleura: No pneumothorax. Trace layering right pleural effusion. No left pleural effusion. Coarsely calcified subcentimeter granuloma in the anterior right upper lobe. No acute consolidative airspace disease, significant pulmonary nodules or lung masses. There is mild passive atelectasis in the dependent right lower lobe and lingula. There is a faint mosaic attenuation throughout both lungs with scattered mild interlobular septal thickening, most suggestive of mild pulmonary edema. Upper abdomen: Unremarkable. Musculoskeletal: No aggressive appearing focal osseous lesions. Mild degenerative changes in the thoracic spine. Review of the MIP images confirms the above findings. IMPRESSION: 1. No evidence of pulmonary embolism. 2. Mild cardiomegaly. Trace right pleural effusion. Mild pulmonary edema. Findings are most in keeping with mild congestive heart failure. 3. Mild multinodular goiter, with a dominant 2.2 cm left thyroid lobe nodule. Recommend short-term outpatient correlation with thyroid ultrasound. This follows ACR consensus guidelines: Managing Incidental Thyroid Nodules Detected on Imaging: White Paper of the ACR Incidental Thyroid Findings Committee. J Am Coll Radiol 2015; 12:143-150. Electronically Signed   By: Ilona Sorrel M.D.   On: 09/07/2015 15:51   Dg Chest Port 1 View  09/06/2015  CLINICAL DATA:  64 year old female with shortness of breath 1 week ago, and swelling in the feet and legs over the last month. EXAM: PORTABLE CHEST 1 VIEW COMPARISON:  No priors. FINDINGS: There is cephalization of the pulmonary vasculature and slight indistinctness of  the interstitial markings suggestive of mild pulmonary edema. Mild cardiomegaly. No definite pleural effusions. Upper mediastinal contours are within normal limits. IMPRESSION: 1. The appearance the chest suggests mild congestive heart failure, as above. Electronically Signed   By: Vinnie Langton M.D.   On: 09/06/2015 18:51    Oren Binet, MD  [facility] Pager:[phone]  If 7PM-7AM, please contact night-coverage [email or web address] Password [email or web address] 09/13/2015, 11:30 AM   LOS: 7 days

## 2015-09-13 NOTE — Progress Notes (Signed)
       Patient Name: Amber Medina Date of Encounter: 09/13/2015    SUBJECTIVE: The patient is asymptomatic. I spoke again with the patient and also her daughter who is a Equities trader by phone.  TELEMETRY:  Atrial fib with poor ventricular rate control Filed Vitals:   09/13/15 0430 09/13/15 0503 09/13/15 0747 09/13/15 1128  BP: 146/80 146/80 112/70 118/69  Pulse:      Temp:   98.6 F (37 C)   TempSrc:   Oral   Resp:      Height:      Weight:      SpO2:   94%     Intake/Output Summary (Last 24 hours) at 09/13/15 1155 Last data filed at 09/13/15 1000  Gross per 24 hour  Intake 13662.3 ml  Output   1851 ml  Net 11811.3 ml   LABS: Basic Metabolic Panel:  Recent Labs  09/12/15 0326 09/13/15 0630  NA 136 137  K 4.5 5.1  CL 89* 98*  CO2 35* 29  GLUCOSE 114* 128*  BUN 27* 20  CREATININE 1.41* 1.12*  CALCIUM 9.6 9.5   CBC:  Recent Labs  09/12/15 0326 09/13/15 0630  WBC 8.7 9.0  HGB 15.3* 14.6  HCT 49.4* 46.8*  MCV 91.8 90.9  PLT 250 215    Radiology/Studies:  No new data  Physical Exam: Blood pressure 118/69, pulse 93, temperature 98.6 F (37 C), temperature source Oral, resp. rate 16, height 5\' 7"  (1.702 m), weight 325 lb 14.4 oz (147.827 kg), SpO2 94 %. Weight change: -2 lb 3.2 oz (-0.998 kg)  Wt Readings from Last 3 Encounters:  09/13/15 325 lb 14.4 oz (147.827 kg)    Obese but no distress Chest is clear Cardiac exam reveals rapid irregularly irregular rhythm No peripheral edema  ASSESSMENT:  1. Atrial fibrillation with poor ventricular rate control 2. Acute diastolic heart failure with volume overload. Initial excellent diuresis has somewhat been lost, as net I&O has gone from a -13 L on 09/12/15 to a -5 L currently (this seems to be an error here). Daily weights however suggests that the weight is decreased from 357 pounds down to 325 pounds. 3. Kidney function is stable  Plan:  Plan TEE guided cardioversion in a.m. NPO after  midnight Continue IV amiodarone  Signed, Sinclair Grooms 09/13/2015, 11:55 AM

## 2015-09-14 LAB — BASIC METABOLIC PANEL
Anion gap: 11 (ref 5–15)
BUN: 16 mg/dL (ref 6–20)
CHLORIDE: 98 mmol/L — AB (ref 101–111)
CO2: 27 mmol/L (ref 22–32)
CREATININE: 1.19 mg/dL — AB (ref 0.44–1.00)
Calcium: 9.3 mg/dL (ref 8.9–10.3)
GFR calc non Af Amer: 47 mL/min — ABNORMAL LOW (ref >60)
GFR, EST AFRICAN AMERICAN: 54 mL/min — AB (ref >60)
Glucose, Bld: 128 mg/dL — ABNORMAL HIGH (ref 65–99)
POTASSIUM: 4.2 mmol/L (ref 3.5–5.1)
SODIUM: 136 mmol/L (ref 135–145)

## 2015-09-14 LAB — CBC
HEMATOCRIT: 46.9 % — AB (ref 36.0–46.0)
HEMOGLOBIN: 14.9 g/dL (ref 12.0–15.0)
MCH: 28.9 pg (ref 26.0–34.0)
MCHC: 31.8 g/dL (ref 30.0–36.0)
MCV: 90.9 fL (ref 78.0–100.0)
Platelets: 205 10*3/uL (ref 150–400)
RBC: 5.16 MIL/uL — AB (ref 3.87–5.11)
RDW: 14.1 % (ref 11.5–15.5)
WBC: 8.5 10*3/uL (ref 4.0–10.5)

## 2015-09-14 MED ORDER — AMIODARONE HCL 200 MG PO TABS
200.0000 mg | ORAL_TABLET | Freq: Two times a day (BID) | ORAL | Status: DC
  Administered 2015-09-14 – 2015-09-15 (×3): 200 mg via ORAL
  Filled 2015-09-14 (×3): qty 1

## 2015-09-14 MED ORDER — DILTIAZEM HCL ER COATED BEADS 240 MG PO CP24
240.0000 mg | ORAL_CAPSULE | Freq: Every day | ORAL | Status: DC
  Administered 2015-09-15: 240 mg via ORAL
  Filled 2015-09-14: qty 1

## 2015-09-14 MED ORDER — DILTIAZEM HCL ER COATED BEADS 180 MG PO CP24: 300.0000 mg | ORAL_CAPSULE | Freq: Every day | ORAL | Status: DC

## 2015-09-14 MED ORDER — METOPROLOL SUCCINATE ER 25 MG PO TB24
12.5000 mg | ORAL_TABLET | Freq: Every day | ORAL | Status: DC
  Administered 2015-09-14: 12.5 mg via ORAL
  Filled 2015-09-14: qty 1

## 2015-09-14 NOTE — Progress Notes (Signed)
CPAP Settings: 4cm of pressure, 4cm water, bleed in O2 2L, Insp max 15, Exp min 4, no auto titrate per Respiratory Therapist Collie Siad.   Sanda Linger

## 2015-09-14 NOTE — Progress Notes (Signed)
Pt ambulated independently around RN station and back to room (about 200 feet) .  SpO2 remained at 94% or higher on Room Air.

## 2015-09-14 NOTE — Progress Notes (Signed)
Patient Profile: 64 y/o female admitted for acute on chronic systolic CHF and atrial fibrillation w/ RVR  Subjective: Feels better. Breathing improved. Asymptomatic with her atrial fibrillation.   Objective: Vital signs in last 24 hours: Temp:  [98.2 F (36.8 C)-99.3 F (37.4 C)] 98.2 F (36.8 C) (12/09 0735) Pulse Rate:  [72-101] 90 (12/09 0735) Resp:  [17-18] 17 (12/09 0735) BP: (118-134)/(53-83) 134/78 mmHg (12/09 0735) SpO2:  [94 %-95 %] 95 % (12/09 0735) Weight:  [325 lb 12.8 oz (147.782 kg)] 325 lb 12.8 oz (147.782 kg) (12/09 0451) Last BM Date: 09/13/15  Intake/Output from previous day: 12/08 0701 - 12/09 0700 In: 3187.2 [P.O.:720; I.V.:2467.2] Out: [id number] [Urine:1650; Stool:1] Intake/Output this shift: Total I/O In: -  Out: 300 [Urine:300]  Medications Current Facility-Administered Medications  Medication Dose Route Frequency Provider Last Rate Last Dose  . 0.9 %  sodium chloride infusion   Intravenous Continuous Belva Crome, MD      . ALPRAZolam Duanne Moron) tablet 0.5 mg  0.5 mg Oral TID PRN Reubin Milan, MD      . amiodarone (NEXTERONE PREMIX) 360 MG/200ML (1.8 mg/mL) IV infusion  30 mg/hr Intravenous Continuous Belva Crome, MD 16.7 mL/hr at 09/13/15 1500 30 mg/hr at 09/13/15 1500  . diltiazem (CARDIZEM) tablet 90 mg  90 mg Oral 4 times per day Brett Canales, PA-C   90 mg at 09/14/15 0509  . furosemide (LASIX) tablet 40 mg  40 mg Oral BID Jonetta Osgood, MD   40 mg at 09/13/15 1811  . iohexol (OMNIPAQUE) 350 MG/ML injection 100 mL  100 mL Intravenous Once PRN Nishant Dhungel, MD      . potassium chloride SA (K-DUR,KLOR-CON) CR tablet 20 mEq  20 mEq Oral BID Jonetta Osgood, MD   20 mEq at 09/13/15 2257  . rivaroxaban (XARELTO) tablet 20 mg  20 mg Oral Q supper Thompson Grayer, MD   20 mg at 09/13/15 1810  . sodium chloride (OCEAN) 0.65 % nasal spray 1 spray  1 spray Each Nare PRN Dianne Dun, NP   1 spray at 09/13/15 2307    PE: General  appearance: alert, cooperative and morbidly obese Neck: no carotid bruit and no JVD Lungs: decreased BS bilaterally Heart: irregularly irregular rhythm, regular rate Extremities: no LEE Pulses: 2+ and symmetric Skin: warm and dry Neurologic: Grossly normal  Lab Results:   Recent Labs  09/12/15 0326 09/13/15 0630 09/14/15 0610  WBC 8.7 9.0 8.5  HGB 15.3* 14.6 14.9  HCT 49.4* 46.8* 46.9*  PLT 250 215 205   BMET  Recent Labs  09/12/15 0326 09/13/15 0630 09/14/15 0610  NA 136 137 136  K 4.5 5.1 4.2  CL 89* 98* 98*  CO2 35* 29 27  GLUCOSE 114* 128* 128*  BUN 27* 20 16  CREATININE 1.41* 1.12* 1.19*  CALCIUM 9.6 9.5 9.3   Filed Weights   09/12/15 0319 09/13/15 0335 09/14/15 0451  Weight: 328 lb 1.6 oz (148.825 kg) 325 lb 14.4 oz (147.827 kg) 325 lb 12.8 oz (147.782 kg)     Assessment/Plan  Principal Problem:   Atrial fibrillation with rapid ventricular response (HCC) Active Problems:   Hyperglycemia   Anasarca   Obesity, morbid (HCC)   Congestive dilated cardiomyopathy (HCC)   Acute systolic CHF (congestive heart failure) (HCC)   Acute respiratory failure with hypoxia (HCC)   Bradycardia   Morbid obesity due to excess calories (HCC)   OSA (obstructive sleep apnea)  1.  Atrial Fibrillation: rate is improved in the 70s.  Continue IV amiodarone load, convert to PO today. Continue Cardizem for rate control. On Xarelto for anticoagulation. Plan was initially for TEE cardioversion today, however this cannot be arranged in Endo today. Given she is rate controlled and currently asymptomatic, we will switch to PO amiodarone today and plan to bring back for elective TEE cardioversion as an outpatient.   2. Acute on Chronic Systolic CHF: -1.6 L out yesterday. I/Os net negative 6.4L. Breathing improved. Weight is down from 357 to 325 lb. Continue daily lasix, strict I/Os and daily weights. Renal function and k both stable. We discussed importance of daily weight monitoring at  home.     LOS: 8 days    Marguis Mathieson M. Rosita Fire, PA-C 09/14/2015 10:23 AM

## 2015-09-14 NOTE — Progress Notes (Signed)
PATIENT DETAILS Name: Amber Medina Age: 64 y.o. Sex: female Date of Birth: [birth year] Admit Date: 09/06/2015 Admitting Physician Reubin Milan, MD PCP:No primary care provider on file.  Brief narrative: 64 year old morbidly obese female presented to the hospital with worsening shortness of breath and lower extremity edema. In the emergency room, patient was found to have A. fib with RVR. Started on IV Cardizem, IV Lasix and admitted for further evaluation and treatment. Hospital course has been complicated by development of episodes of apnea and desaturation along with bradycardia/pauses.   Subjective: Improved SOB/edema-talked with daughter over the phone  Assessment/Plan: Acute systolic heart failure along with right-sided heart failure: Compensated, managed with IV Lasix-briefly held due to increase in creatinine-nowl transitioned to oral Lasix today and follow weight and I&O's .Weight down to 325 pounds (357 pounds on admission), negative balance of 6.4L.Suspect left ventricular failure worsened by A. fib RVR, right-sided heart failure likely secondary to pulmonary hypertension/obesity. Cardiology following.  A. fib RVR: Rate better controlled. Initially on IV Cardizem and IV heparin. Because of difficulty with rate control in a setting of soft BP-started on Amiodarone. Continue Xarelto. Cards following and planning on TEE guided cardioversion 12/9. CHADS2VASC score is 2   History of bradycardia/sinus pauses with hypoxemia: Suspect secondary to OSA.EP consulted and felt not to require PPM or further work up. PCCM consulted-CPAP QHS & PRN Sleep. Will need Assess for ambulatory O2 needs prior to discharge and outpatient sleep study after discharge  OSA/OSH:see above-outpatient sleep study arranged  Acute on chronic hypoxic respiratory failure: Suspect more for chronic issue secondary to above. Oxygen as needed, nocturnal O2 saturation recommended by PCCM  Morbid  obesity: Counseled regarding importance of weight loss  Mild multinodular goiter, with a dominant 2.2 cm left thyroid lobe nodule: Incidental finding on CT chest. Thyroid ultrasound when more stable-suspect could be done as an outpatient   Low-density Lesion in the left occipital lobe on CT head: CT head report this area is either subacute CVA or encephalomalacia, suspect the latter as no strokelike symptoms and complete nonfocal exam. MRI brain ordered for further delineation-however patient is claustrophobic and not keen on getting it. Carotid Doppler negative for stenosis, echocardiogram showed mild systolic dysfunction-is already on anticoagulation for atrial fibrillation. Suspect even if this is a small CVA-MRI brain will not change overall management. A1c 5.9,  LDL 99.  Disposition: Remain inpatient-home later this week  Antimicrobial agents  See below  Anti-infectives    None      DVT Prophylaxis: Xarelto  Code Status: Full code   Family Communication Daughter at bedside  Procedures: None  CONSULTS:  cardiology and pulmonary/intensive care  Time spent 25 minutes-Greater than 50% of this time was spent in counseling, explanation of diagnosis, planning of further management, and coordination of care.  MEDICATIONS: Scheduled Meds: . diltiazem  90 mg Oral 4 times per day  . furosemide  40 mg Oral BID  . potassium chloride  20 mEq Oral BID  . rivaroxaban  20 mg Oral Q supper   Continuous Infusions: . sodium chloride    . amiodarone 30 mg/hr (09/13/15 1500)   PRN Meds:.ALPRAZolam, iohexol, sodium chloride    PHYSICAL EXAM: Vital signs in last 24 hours: Filed Vitals:   09/13/15 2307 09/13/15 2358 09/14/15 0451 09/14/15 0735  BP: 120/83 118/69 118/53 134/78  Pulse:  72 101 90  Temp:  99.3 F (37.4 C) 98.7 F (  37.1 C) 98.2 F (36.8 C)  TempSrc:  Oral Oral Oral  Resp:  18 18 17   Height:      Weight:   147.782 kg (325 lb 12.8 oz)   SpO2:  95% 94% 95%     Weight change: -0.045 kg (-1.6 oz) Filed Weights   09/12/15 0319 09/13/15 0335 09/14/15 0451  Weight: 148.825 kg (328 lb 1.6 oz) 147.827 kg (325 lb 14.4 oz) 147.782 kg (325 lb 12.8 oz)   Body mass index is 51.02 kg/(m^2).   Gen Exam: Awake and alert with clear speech.   Neck: Supple, No JVD.   Chest: B/L Clear.   CVS: S1 S2 irregular-tachy, no murmurs.  Abdomen: soft, BS +, non tender, non distended.  Extremities: + edema, lower extremities warm to touch. Neurologic: Non Focal.   Skin: No Rash.   Wounds: N/A.   Intake/Output from previous day:  Intake/Output Summary (Last 24 hours) at 09/14/15 1006 Last data filed at 09/14/15 0911  Gross per 24 hour  Intake  746.8 ml  Output   1951 ml  Net -1204.2 ml     LAB RESULTS: CBC  Recent Labs Lab 09/10/15 0500 09/11/15 0454 09/12/15 0326 09/13/15 0630 09/14/15 0610  WBC 8.9 8.0 8.7 9.0 8.5  HGB 14.6 14.5 15.3* 14.6 14.9  HCT 46.4* 46.2* 49.4* 46.8* 46.9*  PLT 211 210 250 215 205  MCV 91.0 91.3 91.8 90.9 90.9  MCH 28.6 28.7 28.4 28.3 28.9  MCHC 31.5 31.4 31.0 31.2 31.8  RDW 14.2 14.2 14.4 14.4 14.1    Chemistries   Recent Labs Lab 09/10/15 0500 09/11/15 0454 09/12/15 0326 09/13/15 0630 09/14/15 0610  NA 139 138 136 137 136  K 4.1 4.4 4.5 5.1 4.2  CL 95* 95* 89* 98* 98*  CO2 33* 33* 35* 29 27  GLUCOSE 90 116* 114* 128* 128*  BUN 17 24* 27* 20 16  CREATININE 1.01* 1.12* 1.41* 1.12* 1.19*  CALCIUM 9.2 9.2 9.6 9.5 9.3    CBG: No results for input(s): GLUCAP in the last 168 hours.  GFR Estimated Creatinine Clearance: 70.5 mL/min (by C-G formula based on Cr of 1.19).  Coagulation profile No results for input(s): INR, PROTIME in the last 168 hours.  Cardiac Enzymes No results for input(s): CKMB, TROPONINI, MYOGLOBIN in the last 168 hours.  Invalid input(s): CK  Invalid input(s): POCBNP No results for input(s): DDIMER in the last 72 hours. No results for input(s): HGBA1C in the last 72 hours. No  results for input(s): CHOL, HDL, LDLCALC, TRIG, CHOLHDL, LDLDIRECT in the last 72 hours. No results for input(s): TSH, T4TOTAL, T3FREE, THYROIDAB in the last 72 hours.  Invalid input(s): FREET3 No results for input(s): VITAMINB12, FOLATE, FERRITIN, TIBC, IRON, RETICCTPCT in the last 72 hours. No results for input(s): LIPASE, AMYLASE in the last 72 hours.  Urine Studies No results for input(s): UHGB, CRYS in the last 72 hours.  Invalid input(s): UACOL, UAPR, USPG, UPH, UTP, UGL, UKET, UBIL, UNIT, UROB, ULEU, UEPI, UWBC, URBC, UBAC, CAST, UCOM, BILUA  MICROBIOLOGY: Recent Results (from the past 240 hour(s))  MRSA PCR Screening     Status: None   Collection Time: 09/07/15 12:12 AM  Result Value Ref Range Status   MRSA by PCR NEGATIVE NEGATIVE Final    Comment:        The GeneXpert MRSA Assay (FDA approved for NASAL specimens only), is one component of a comprehensive MRSA colonization surveillance program. It is not intended to diagnose MRSA infection  nor to guide or monitor treatment for MRSA infections.     RADIOLOGY STUDIES/RESULTS: Ct Head Wo Contrast  09/07/2015  CLINICAL DATA:  64 year old with shortness of breath and syncopal episode. History of atrial fibrillation. Initial encounter. EXAM: CT HEAD WITHOUT CONTRAST TECHNIQUE: Contiguous axial images were obtained from the base of the skull through the vertex without intravenous contrast. COMPARISON:  None. FINDINGS: There is no evidence of acute intracranial hemorrhage, mass lesion, brain edema or extra-axial fluid collection. The ventricles and subarachnoid spaces are appropriately sized for age. There is questionable low-density medially in the left occipital lobe on images 18 and 19 which could reflect encephalomalacia or a subacute infarct. There is minimal patchy periventricular white matter disease, likely due to chronic small vessel ischemic change. Intracranial atherosclerosis noted. Mild anterior left ethmoid sinus  opacification. The visualized paranasal sinuses, mastoid air cells and middle ears are otherwise clear. The calvarium is intact. IMPRESSION: 1. Potential subacute subcortical infarct or encephalomalacia in the left occipital lobe. 2. No evidence of acute intracranial hemorrhage, midline shift or hydrocephalus. Electronically Signed   By: Richardean Sale M.D.   On: 09/07/2015 15:51   Ct Angio Chest Pe W/cm &/or Wo Cm  09/07/2015  CLINICAL DATA:  Shortness of breath. Atrial fibrillation. Syncopal episode. Dizziness. EXAM: CT ANGIOGRAPHY CHEST WITH CONTRAST TECHNIQUE: Multidetector CT imaging of the chest was performed using the standard protocol during bolus administration of intravenous contrast. Multiplanar CT image reconstructions and MIPs were obtained to evaluate the vascular anatomy. CONTRAST:  164mL OMNIPAQUE IOHEXOL 300 MG/ML  SOLN COMPARISON:  Chest radiograph from earlier today. FINDINGS: Mediastinum/Nodes: The study is moderate quality for the evaluation of pulmonary embolism, with evaluation of the subsegmental pulmonary arteries limited by motion artifact. There are no filling defects in the central, lobar or segmental pulmonary artery branches to suggest acute pulmonary embolism. Great vessels are normal in course and caliber. Mild cardiomegaly. No pericardial fluid/thickening. Left anterior descending coronary atherosclerosis. There is a mild multinodular goiter asymmetrically involving the left thyroid lobe, including a partially calcified 1.5 cm posterior upper left thyroid lobe nodule and a hypodense 2.2 cm lower left thyroid lobe nodule. Normal esophagus. No axillary adenopathy. There are a few top-normal size right paratracheal nodes and right hilar nodes containing coarse internal calcifications, in keeping with prior granulomatous disease. Otherwise no mediastinal or hilar adenopathy. Lungs/Pleura: No pneumothorax. Trace layering right pleural effusion. No left pleural effusion. Coarsely  calcified subcentimeter granuloma in the anterior right upper lobe. No acute consolidative airspace disease, significant pulmonary nodules or lung masses. There is mild passive atelectasis in the dependent right lower lobe and lingula. There is a faint mosaic attenuation throughout both lungs with scattered mild interlobular septal thickening, most suggestive of mild pulmonary edema. Upper abdomen: Unremarkable. Musculoskeletal: No aggressive appearing focal osseous lesions. Mild degenerative changes in the thoracic spine. Review of the MIP images confirms the above findings. IMPRESSION: 1. No evidence of pulmonary embolism. 2. Mild cardiomegaly. Trace right pleural effusion. Mild pulmonary edema. Findings are most in keeping with mild congestive heart failure. 3. Mild multinodular goiter, with a dominant 2.2 cm left thyroid lobe nodule. Recommend short-term outpatient correlation with thyroid ultrasound. This follows ACR consensus guidelines: Managing Incidental Thyroid Nodules Detected on Imaging: White Paper of the ACR Incidental Thyroid Findings Committee. J Am Coll Radiol 2015; 12:143-150. Electronically Signed   By: Ilona Sorrel M.D.   On: 09/07/2015 15:51   Dg Chest Port 1 View  09/06/2015  CLINICAL DATA:  64 year old female  with shortness of breath 1 week ago, and swelling in the feet and legs over the last month. EXAM: PORTABLE CHEST 1 VIEW COMPARISON:  No priors. FINDINGS: There is cephalization of the pulmonary vasculature and slight indistinctness of the interstitial markings suggestive of mild pulmonary edema. Mild cardiomegaly. No definite pleural effusions. Upper mediastinal contours are within normal limits. IMPRESSION: 1. The appearance the chest suggests mild congestive heart failure, as above. Electronically Signed   By: Vinnie Langton M.D.   On: 09/06/2015 18:51    Oren Binet, MD  [facility] Pager:[phone]  If 7PM-7AM, please contact  night-coverage [email or web address] Password [email or web address] 09/14/2015, 10:06 AM   LOS: 8 days

## 2015-09-14 NOTE — Progress Notes (Signed)
Nutrition Education Note  RD consulted for nutrition education regarding CHF.  RD provided "Low Sodium Nutrition Therapy" handout from the Academy of [employer]. Reviewed patient's dietary recall. Provided examples on ways to decrease sodium intake in diet. Discouraged intake of processed foods and use of salt shaker. Encouraged fresh fruits and vegetables as well as whole grain sources of carbohydrates to maximize fiber intake.   RD discussed why it is important for patient to adhere to diet recommendations, and emphasized the role of fluids, foods to avoid, and importance of weighing self daily. Teach back method used.  Expect good compliance.  Body mass index is 51.02 kg/(m^2). Pt meets criteria for class 3, extreme/morbid obesity based on current BMI.  Current diet order is NPO for a procedure. Labs and medications reviewed. No further nutrition interventions warranted at this time. RD contact information provided. If additional nutrition issues arise, please re-consult RD.   Molli Barrows, RD, LDN, [place] Pager [phone] After Hours Pager [phone]

## 2015-09-15 LAB — BASIC METABOLIC PANEL
ANION GAP: 11 (ref 5–15)
BUN: 19 mg/dL (ref 6–20)
CO2: 27 mmol/L (ref 22–32)
Calcium: 9.3 mg/dL (ref 8.9–10.3)
Chloride: 97 mmol/L — ABNORMAL LOW (ref 101–111)
Creatinine, Ser: 1.23 mg/dL — ABNORMAL HIGH (ref 0.44–1.00)
GFR calc Af Amer: 52 mL/min — ABNORMAL LOW (ref >60)
GFR, EST NON AFRICAN AMERICAN: 45 mL/min — AB (ref >60)
GLUCOSE: 123 mg/dL — AB (ref 65–99)
POTASSIUM: 4.3 mmol/L (ref 3.5–5.1)
Sodium: 135 mmol/L (ref 135–145)

## 2015-09-15 LAB — CBC
HEMATOCRIT: 46.8 % — AB (ref 36.0–46.0)
Hemoglobin: 14.6 g/dL (ref 12.0–15.0)
MCH: 28 pg (ref 26.0–34.0)
MCHC: 31.2 g/dL (ref 30.0–36.0)
MCV: 89.7 fL (ref 78.0–100.0)
PLATELETS: 212 10*3/uL (ref 150–400)
RBC: 5.22 MIL/uL — AB (ref 3.87–5.11)
RDW: 14.2 % (ref 11.5–15.5)
WBC: 8.5 10*3/uL (ref 4.0–10.5)

## 2015-09-15 MED ORDER — POTASSIUM CHLORIDE CRYS ER 20 MEQ PO TBCR: 20.0000 meq | EXTENDED_RELEASE_TABLET | Freq: Two times a day (BID) | ORAL | Status: DC

## 2015-09-15 MED ORDER — METOPROLOL SUCCINATE ER 25 MG PO TB24
25.0000 mg | ORAL_TABLET | Freq: Every day | ORAL | Status: DC
  Administered 2015-09-15: 25 mg via ORAL
  Filled 2015-09-15: qty 1

## 2015-09-15 MED ORDER — AMIODARONE HCL 200 MG PO TABS: 200.0000 mg | ORAL_TABLET | Freq: Two times a day (BID) | ORAL | Status: DC

## 2015-09-15 MED ORDER — RIVAROXABAN 20 MG PO TABS: 20.0000 mg | ORAL_TABLET | Freq: Every day | ORAL | Status: DC

## 2015-09-15 MED ORDER — METOPROLOL SUCCINATE ER 25 MG PO TB24: 25.0000 mg | ORAL_TABLET | Freq: Every day | ORAL | Status: DC

## 2015-09-15 MED ORDER — DILTIAZEM HCL ER COATED BEADS 240 MG PO CP24: 240.0000 mg | ORAL_CAPSULE | Freq: Every day | ORAL | Status: DC

## 2015-09-15 MED ORDER — FUROSEMIDE 40 MG PO TABS: 40.0000 mg | ORAL_TABLET | Freq: Two times a day (BID) | ORAL | Status: DC

## 2015-09-15 NOTE — Progress Notes (Signed)
Pt concerned about dieting when she arrives home. RN went over various diets with pt, and included them in the discharge paperwork. RN discussed with pt to call [facility] with any questions regarding discharge.

## 2015-09-15 NOTE — Discharge Summary (Signed)
PATIENT DETAILS Name: Amber Medina Age: 64 y.o. Sex: female Date of Birth: Jul 03, 1951 MRN: [id number]. Admitting Physician: Reubin Milan, MD PCP:No primary care provider on file.  Admit Date: 09/06/2015 Discharge date: 09/15/2015  Recommendations for Outpatient Follow-up:  1. Ensure follow-up with cardiology (Dr. Radford Pax) for elective cardioversion in 3-4 weeks.  2. Please repeat CBC/BMET at next visit 3. Incidental finding of on CT chest of Mild multinodular goiter, with a dominant 2.2 cm left thyroid lobe nodule-. Thyroid ultrasound deferred to outpatient setting  PRIMARY DISCHARGE DIAGNOSIS:  Principal Problem:   Atrial fibrillation with rapid ventricular response (HCC) Active Problems:   Hyperglycemia   Anasarca   Obesity, morbid (HCC)   Congestive dilated cardiomyopathy (HCC)   Acute systolic CHF (congestive heart failure) (HCC)   Acute respiratory failure with hypoxia (HCC)   Bradycardia   Morbid obesity due to excess calories (HCC)   OSA (obstructive sleep apnea)      PAST MEDICAL HISTORY: Past Medical History  Diagnosis Date  . Morbid obesity ([place])   . Persistent atrial fibrillation ([place])   . Snoring     DISCHARGE MEDICATIONS: Current Discharge Medication List    START taking these medications   Details  amiodarone (PACERONE) 200 MG tablet Take 1 tablet (200 mg total) by mouth 2 (two) times daily. Qty: 60 tablet, Refills: 0    diltiazem (CARDIZEM CD) 240 MG 24 hr capsule Take 1 capsule (240 mg total) by mouth daily. Qty: 60 capsule, Refills: 0    furosemide (LASIX) 40 MG tablet Take 1 tablet (40 mg total) by mouth 2 (two) times daily. Qty: 60 tablet, Refills: 0    metoprolol succinate (TOPROL-XL) 25 MG 24 hr tablet Take 1 tablet (25 mg total) by mouth daily. Qty: 30 tablet, Refills: 0    potassium chloride SA (K-DUR,KLOR-CON) 20 MEQ tablet Take 1 tablet (20 mEq total) by mouth 2 (two) times daily. Qty: 60 tablet, Refills: 0    rivaroxaban  (XARELTO) 20 MG TABS tablet Take 1 tablet (20 mg total) by mouth daily with supper. Qty: 60 tablet, Refills: 0      CONTINUE these medications which have NOT CHANGED   Details  Ascorbic Acid (VITAMIN C) 1000 MG tablet Take 2,000 mg by mouth daily.    b complex vitamins tablet Take 1 tablet by mouth daily.    Cholecalciferol (VITAMIN D PO) Take by mouth.    diphenhydrAMINE (BENADRYL) 25 mg capsule Take 25 mg by mouth every 6 (six) hours as needed for itching or allergies.    Hyaluronic Acid-Vitamin C (HYALURONIC ACID PO) Take by mouth.    Multiple Vitamin (MULTIVITAMIN WITH MINERALS) TABS tablet Take 1 tablet by mouth daily.    OVER THE COUNTER MEDICATION as directed. Allergy eye drops.        ALLERGIES:   Allergies  Allergen Reactions  . Compazine [Prochlorperazine Edisylate] Swelling    BRIEF HPI:  See H&P, Labs, Consult and Test reports for all details in brief, 64 year old morbidly obese female presented to the hospital with worsening shortness of breath and lower extremity edema. In the emergency room, patient was found to have A. fib with RVR. Started on IV Cardizem, IV Lasix and admitted for further evaluation and treatment.   CONSULTATIONS:   cardiology and pulmonary/intensive care  PERTINENT RADIOLOGIC STUDIES: Ct Head Wo Contrast  09/07/2015  CLINICAL DATA:  64 year old with shortness of breath and syncopal episode. History of atrial fibrillation. Initial encounter. EXAM: CT HEAD WITHOUT CONTRAST TECHNIQUE: Contiguous  axial images were obtained from the base of the skull through the vertex without intravenous contrast. COMPARISON:  None. FINDINGS: There is no evidence of acute intracranial hemorrhage, mass lesion, brain edema or extra-axial fluid collection. The ventricles and subarachnoid spaces are appropriately sized for age. There is questionable low-density medially in the left occipital lobe on images 18 and 19 which could reflect encephalomalacia or a subacute  infarct. There is minimal patchy periventricular white matter disease, likely due to chronic small vessel ischemic change. Intracranial atherosclerosis noted. Mild anterior left ethmoid sinus opacification. The visualized paranasal sinuses, mastoid air cells and middle ears are otherwise clear. The calvarium is intact. IMPRESSION: 1. Potential subacute subcortical infarct or encephalomalacia in the left occipital lobe. 2. No evidence of acute intracranial hemorrhage, midline shift or hydrocephalus. Electronically Signed   By: Richardean Sale M.D.   On: 09/07/2015 15:51   Ct Angio Chest Pe W/cm &/or Wo Cm  09/07/2015  CLINICAL DATA:  Shortness of breath. Atrial fibrillation. Syncopal episode. Dizziness. EXAM: CT ANGIOGRAPHY CHEST WITH CONTRAST TECHNIQUE: Multidetector CT imaging of the chest was performed using the standard protocol during bolus administration of intravenous contrast. Multiplanar CT image reconstructions and MIPs were obtained to evaluate the vascular anatomy. CONTRAST:  168mL OMNIPAQUE IOHEXOL 300 MG/ML  SOLN COMPARISON:  Chest radiograph from earlier today. FINDINGS: Mediastinum/Nodes: The study is moderate quality for the evaluation of pulmonary embolism, with evaluation of the subsegmental pulmonary arteries limited by motion artifact. There are no filling defects in the central, lobar or segmental pulmonary artery branches to suggest acute pulmonary embolism. Great vessels are normal in course and caliber. Mild cardiomegaly. No pericardial fluid/thickening. Left anterior descending coronary atherosclerosis. There is a mild multinodular goiter asymmetrically involving the left thyroid lobe, including a partially calcified 1.5 cm posterior upper left thyroid lobe nodule and a hypodense 2.2 cm lower left thyroid lobe nodule. Normal esophagus. No axillary adenopathy. There are a few top-normal size right paratracheal nodes and right hilar nodes containing coarse internal calcifications, in  keeping with prior granulomatous disease. Otherwise no mediastinal or hilar adenopathy. Lungs/Pleura: No pneumothorax. Trace layering right pleural effusion. No left pleural effusion. Coarsely calcified subcentimeter granuloma in the anterior right upper lobe. No acute consolidative airspace disease, significant pulmonary nodules or lung masses. There is mild passive atelectasis in the dependent right lower lobe and lingula. There is a faint mosaic attenuation throughout both lungs with scattered mild interlobular septal thickening, most suggestive of mild pulmonary edema. Upper abdomen: Unremarkable. Musculoskeletal: No aggressive appearing focal osseous lesions. Mild degenerative changes in the thoracic spine. Review of the MIP images confirms the above findings. IMPRESSION: 1. No evidence of pulmonary embolism. 2. Mild cardiomegaly. Trace right pleural effusion. Mild pulmonary edema. Findings are most in keeping with mild congestive heart failure. 3. Mild multinodular goiter, with a dominant 2.2 cm left thyroid lobe nodule. Recommend short-term outpatient correlation with thyroid ultrasound. This follows ACR consensus guidelines: Managing Incidental Thyroid Nodules Detected on Imaging: White Paper of the ACR Incidental Thyroid Findings Committee. J Am Coll Radiol 2015; 12:143-150. Electronically Signed   By: Ilona Sorrel M.D.   On: 09/07/2015 15:51   Dg Chest Port 1 View  09/06/2015  CLINICAL DATA:  64 year old female with shortness of breath 1 week ago, and swelling in the feet and legs over the last month. EXAM: PORTABLE CHEST 1 VIEW COMPARISON:  No priors. FINDINGS: There is cephalization of the pulmonary vasculature and slight indistinctness of the interstitial markings suggestive of mild pulmonary  edema. Mild cardiomegaly. No definite pleural effusions. Upper mediastinal contours are within normal limits. IMPRESSION: 1. The appearance the chest suggests mild congestive heart failure, as above.  Electronically Signed   By: Vinnie Langton M.D.   On: 09/06/2015 18:51     PERTINENT LAB RESULTS: CBC:  Recent Labs  09/14/15 0610 09/15/15 0338  WBC 8.5 8.5  HGB 14.9 14.6  HCT 46.9* 46.8*  PLT 205 212   CMET CMP     Component Value Date/Time   NA 135 09/15/2015 0338   K 4.3 09/15/2015 0338   CL 97* 09/15/2015 0338   CO2 27 09/15/2015 0338   GLUCOSE 123* 09/15/2015 0338   BUN 19 09/15/2015 0338   CREATININE 1.23* 09/15/2015 0338   CALCIUM 9.3 09/15/2015 0338   PROT 6.4* 09/07/2015 0655   ALBUMIN 3.5 09/07/2015 0655   AST 34 09/07/2015 0655   ALT 34 09/07/2015 0655   ALKPHOS 57 09/07/2015 0655   BILITOT 1.5* 09/07/2015 0655   GFRNONAA 45* 09/15/2015 0338   GFRAA 52* 09/15/2015 0338    GFR Estimated Creatinine Clearance: 68 mL/min (by C-G formula based on Cr of 1.23). No results for input(s): LIPASE, AMYLASE in the last 72 hours. No results for input(s): CKTOTAL, CKMB, CKMBINDEX, TROPONINI in the last 72 hours. Invalid input(s): POCBNP No results for input(s): DDIMER in the last 72 hours. No results for input(s): HGBA1C in the last 72 hours. No results for input(s): CHOL, HDL, LDLCALC, TRIG, CHOLHDL, LDLDIRECT in the last 72 hours. No results for input(s): TSH, T4TOTAL, T3FREE, THYROIDAB in the last 72 hours.  Invalid input(s): FREET3 No results for input(s): VITAMINB12, FOLATE, FERRITIN, TIBC, IRON, RETICCTPCT in the last 72 hours. Coags: No results for input(s): INR in the last 72 hours.  Invalid input(s): PT Microbiology: Recent Results (from the past 240 hour(s))  MRSA PCR Screening     Status: None   Collection Time: 09/07/15 12:12 AM  Result Value Ref Range Status   MRSA by PCR NEGATIVE NEGATIVE Final    Comment:        The GeneXpert MRSA Assay (FDA approved for NASAL specimens only), is one component of a comprehensive MRSA colonization surveillance program. It is not intended to diagnose MRSA infection nor to guide or monitor treatment  for MRSA infections.      BRIEF HOSPITAL COURSE:  Acute systolic heart failure along with right-sided heart failure: Compensated, managed with IV Lasix-briefly held due to increase in creatinine-now transitioned to oral Lasix.Weight down to 324 pounds (357 pounds on admission), negative balance of 7.6.Suspect left ventricular failure worsened by A. fib RVR, right-sided heart failure likely secondary to pulmonary hypertension/obesity  A. fib RVR: Rate better controlled. Initially on IV Cardizem and IV heparin. Because of difficulty with rate control in a setting of soft BP-started on Amiodarone-which is being transitioned to oral route. Currently rate well controlled on oral Cardizem, metoprolol and amiodarone. Anticoagulated with Xarelto. Cards planning on elective cardioversion as outpatient. CHADS2VASC score is 2   History of bradycardia/sinus pauses with hypoxemia: Suspect secondary to OSA.EP consulted and felt not to require PPM or further work up. PCCM consulted-CPAP QHS & PRN Sleep. Ambulatory O2 needs done prior to discharge O2 sats >90.Outpatient sleep study after discharge. Case management consulted for CPAP arrangement.  OSA/OSH:see above-outpatient sleep study arranged  Acute on chronic hypoxic respiratory failure: Suspect more for chronic issue secondary to above. Now on room air-does not qualify for home O2 (Ambulatory O2 needs done prior to discharge  O2 sats >90)  Morbid obesity: Counseled regarding importance of weight loss  Mild multinodular goiter, with a dominant 2.2 cm left thyroid lobe nodule: Incidental finding on CT chest. Thyroid ultrasound when more stable-suspect could be done as an outpatient. TSH normal. Patient aware of this finding and needing outpatient work up.  Low-density Lesion in the left occipital lobe on CT head: CT head report this area is either subacute CVA or encephalomalacia, suspect the latter as no strokelike symptoms and complete nonfocal exam. MRI  brain ordered for further delineation-however patient is claustrophobic and not keen on getting it. Carotid Doppler negative for stenosis, echocardiogram showed mild systolic dysfunction-is already on anticoagulation for atrial fibrillation. Suspect even if this is a small CVA-MRI brain will not change overall management. A1c 5.9, LDL 99.   TODAY-DAY OF DISCHARGE:  Subjective:   Amber Medina today has no headache,no chest abdominal pain,no new weakness tingling or numbness, feels much better wants to go home today.   Objective:   Blood pressure 113/94, pulse 96, temperature 97.5 F (36.4 C), temperature source Oral, resp. rate 18, height 5\' 7"  (1.702 m), weight 147.192 kg (324 lb 8 oz), SpO2 98 %.  Intake/Output Summary (Last 24 hours) at 09/15/15 1105 Last data filed at 09/15/15 1001  Gross per 24 hour  Intake 1268.12 ml  Output   2451 ml  Net -1182.88 ml   Filed Weights   09/13/15 0335 09/14/15 0451 09/15/15 0538  Weight: 147.827 kg (325 lb 14.4 oz) 147.782 kg (325 lb 12.8 oz) 147.192 kg (324 lb 8 oz)    Exam Awake Alert, Oriented *3, No new F.N deficits, Normal affect [place].AT,PERRAL Supple Neck,No JVD, No cervical lymphadenopathy appriciated.  Symmetrical Chest wall movement, Good air movement bilaterally, CTAB RRR,No Gallops,Rubs or new Murmurs, No Parasternal Heave +ve B.Sounds, Abd Soft, Non tender, No organomegaly appriciated, No rebound -guarding or rigidity. No Cyanosis, Clubbing or edema, No new Rash or bruise  DISCHARGE CONDITION: Stable  DISPOSITION: Home  DISCHARGE INSTRUCTIONS:    Activity:  As tolerated with Full fall precautions use walker/cane & assistance as needed  Follow with Primary MD in 1-2 weeks  Please follow with cardiology (Dr Radford Pax) for elective cardioversion.  Keep your appointment with Sleep Study Lab  Make sure your primary MD gets a Thyroid Ultrasound-you had thyroid nodules seen incidentally on your CT Chest  Continue using  CPAP  You have Congestive Heart Failure: Please call your Cardiologist or Primary MD-Anytime you have any of the following symptoms: 1) 3 pound weight gain in 24 hours or 5 pounds in 1 week 2) shortness of breath, with or without a dry hacking cough 3) swelling in the hands, feet or stomach 4) if you have to sleep on extra pillows at night in order to breathe Follow cardiac low salt diet and 1.5 lit/day fluid restriction.   Get Medicines reviewed and adjusted: Please take all your medications with you for your next visit with your Primary MD  Please request your Primary MD to go over all hospital tests and procedure/radiological results at the follow up, please ask your Primary MD to get all Hospital records sent to his/her office.  If you experience worsening of your admission symptoms, develop shortness of breath, life threatening emergency, suicidal or homicidal thoughts you must seek medical attention immediately by calling 911 or calling your MD immediately  if symptoms less severe.  You must read complete instructions/literature along with all the possible adverse reactions/side effects for all the Medicines  you take and that have been prescribed to you. Take any new Medicines after you have completely understood and accpet all the possible adverse reactions/side effects.   Do not drive when taking Pain medications.   Do not take more than prescribed Pain, Sleep and Anxiety Medications  Special Instructions: If you have smoked or chewed Tobacco  in the last 2 yrs please stop smoking, stop any regular Alcohol  and or any Recreational drug use.  Wear Seat belts while driving.  Please note  You were cared for by a hospitalist during your hospital stay. Once you are discharged, your primary care physician will handle any further medical issues. Please note that NO REFILLS for any discharge medications will be authorized once you are discharged, as it is imperative that you return to your  primary care physician (or establish a relationship with a primary care physician if you do not have one) for your aftercare needs so that they can reassess your need for medications and monitor your lab values.   Diet recommendation: Heart Healthy diet  Discharge Instructions    (HEART FAILURE PATIENTS) Call MD:  Anytime you have any of the following symptoms: 1) 3 pound weight gain in 24 hours or 5 pounds in 1 week 2) shortness of breath, with or without a dry hacking cough 3) swelling in the hands, feet or stomach 4) if you have to sleep on extra pillows at night in order to breathe.    Complete by:  As directed      Diet - low sodium heart healthy    Complete by:  As directed      Increase activity slowly    Complete by:  As directed            Follow-up Information    Follow up with Deneise Lever, MD On 12/26/2015.   Specialty:  Pulmonary Disease   Why:  Appt at 10:00 AM for sleep evaluation.     Contact information:   [place] [address] [phone]       Follow up with [facility] On 10/08/2015.   Why:  @ 8:00 pm for sleep study- Office to call patient as well.    Contact information:   [address], [place] [phone]      Follow up with [facility].   Why:  CPAP and Oxygen   Contact information:   [place] [address] [phone]       Follow up with [name], MD. Schedule an appointment as soon as possible for a visit in 2 weeks.   Specialty:  Cardiology   Contact information:   [id number] [place] [address] [phone]      Total Time spent on discharge equals  45 minutes  Signed: Oren Binet 09/15/2015 11:05 AM

## 2015-09-15 NOTE — Progress Notes (Signed)
Patient Profile: 64 y/o female admitted for acute on chronic systolic CHF and atrial fibrillation w/ RVR  Subjective: Feels better. Breathing improved. Asymptomatic with her atrial fibrillation.   Objective: Vital signs in last 24 hours: Temp:  [97.5 F (36.4 C)-98.8 F (37.1 C)] 97.5 F (36.4 C) (12/10 0538) Pulse Rate:  [84-96] 96 (12/10 0538) Resp:  [16-18] 18 (12/10 0538) BP: (115-131)/(64-99) 130/99 mmHg (12/10 0538) SpO2:  [96 %-98 %] 98 % (12/10 0538) Weight:  [324 lb 8 oz (147.192 kg)] 324 lb 8 oz (147.192 kg) (12/10 0538) Last BM Date: 09/13/15  Intake/Output from previous day: 12/09 0701 - 12/10 0700 In: 788.1 [P.O.:480; I.V.:308.1] Out: 2751 [Urine:2750; Stool:1] Intake/Output this shift:    Medications Current Facility-Administered Medications  Medication Dose Route Frequency Provider Last Rate Last Dose  . 0.9 %  sodium chloride infusion   Intravenous Continuous Belva Crome, MD   Stopped at 09/14/15 0700  . ALPRAZolam Duanne Moron) tablet 0.5 mg  0.5 mg Oral TID PRN Reubin Milan, MD      . amiodarone (PACERONE) tablet 200 mg  200 mg Oral BID Belva Crome, MD   200 mg at 09/14/15 2242  . diltiazem (CARDIZEM CD) 24 hr capsule 240 mg  240 mg Oral Daily Belva Crome, MD      . furosemide (LASIX) tablet 40 mg  40 mg Oral BID Jonetta Osgood, MD   40 mg at 09/14/15 1834  . iohexol (OMNIPAQUE) 350 MG/ML injection 100 mL  100 mL Intravenous Once PRN Nishant Dhungel, MD      . metoprolol succinate (TOPROL-XL) 24 hr tablet 12.5 mg  12.5 mg Oral Daily Belva Crome, MD   12.5 mg at 09/14/15 1227  . potassium chloride SA (K-DUR,KLOR-CON) CR tablet 20 mEq  20 mEq Oral BID Jonetta Osgood, MD   20 mEq at 09/14/15 2241  . rivaroxaban (XARELTO) tablet 20 mg  20 mg Oral Q supper Thompson Grayer, MD   20 mg at 09/14/15 1716  . sodium chloride (OCEAN) 0.65 % nasal spray 1 spray  1 spray Each Nare PRN Dianne Dun, NP   1 spray at 09/13/15 2307    PE: General  appearance: alert, cooperative and morbidly obese Neck: no carotid bruit and no JVD Lungs: decreased BS bilaterally Heart: irregularly irregular rhythm, regular rate Extremities: no LEE Pulses: 2+ and symmetric Skin: warm and dry Neurologic: Grossly normal  Lab Results:   Recent Labs  09/13/15 0630 09/14/15 0610 09/15/15 0338  WBC 9.0 8.5 8.5  HGB 14.6 14.9 14.6  HCT 46.8* 46.9* 46.8*  PLT 215 205 212   BMET  Recent Labs  09/13/15 0630 09/14/15 0610 09/15/15 0338  NA 137 136 135  K 5.1 4.2 4.3  CL 98* 98* 97*  CO2 29 27 27   GLUCOSE 128* 128* 123*  BUN 20 16 19   CREATININE 1.12* 1.19* 1.23*  CALCIUM 9.5 9.3 9.3   Filed Weights   09/13/15 0335 09/14/15 0451 09/15/15 0538  Weight: 325 lb 14.4 oz (147.827 kg) 325 lb 12.8 oz (147.782 kg) 324 lb 8 oz (147.192 kg)     Assessment/Plan  Principal Problem:   Atrial fibrillation with rapid ventricular response (HCC) Active Problems:   Hyperglycemia   Anasarca   Obesity, morbid (HCC)   Congestive dilated cardiomyopathy (HCC)   Acute systolic CHF (congestive heart failure) (HCC)   Acute respiratory failure with hypoxia (HCC)   Bradycardia   Morbid obesity due  to excess calories (HCC)   OSA (obstructive sleep apnea)  1. Atrial Fibrillation: rate is improved in the 70s.  Continue po  amiodarone  Continue Cardizem for rate control. On Xarelto for anticoagulation. Plan    for elective  cardioversion as an outpatient after 3-4 weeks of anticoagulation .  I increased Toprol XL to 25 mg a day for better rate control    2. Acute on Chronic Systolic CHF: -1.6 L out yesterday. I/Os net negative 6.4L. Breathing improved. Weight is down from 357 to 325 lb. Continue daily lasix, strict I/Os and daily weights. Renal function and k both stable. We discussed importance of daily weight monitoring at home.   She will follow up with Dr. Radford Pax at [facility].  [phone]    Juliett Eastburn, Wonda Cheng, MD  09/15/2015 8:44 AM    [facility],  [place], [place]  [address] Pager [phone] Phone: [phone]; Fax: [phone]   [facility]  [address] [place], [place]  [address] [phone]   Fax [phone]

## 2015-09-15 NOTE — Care Management (Signed)
CM spoke with Tiffany at [facility]. [facility] will set-up patient's CPAP at home. Notified patient will be discharged today.  Venita Sheffield, RN BSN CCM

## 2015-10-08 ENCOUNTER — Ambulatory Visit (HOSPITAL_BASED_OUTPATIENT_CLINIC_OR_DEPARTMENT_OTHER): Payer: 59

## 2015-10-11 ENCOUNTER — Ambulatory Visit (INDEPENDENT_AMBULATORY_CARE_PROVIDER_SITE_OTHER): Payer: Medicare (Managed Care) | Admitting: Cardiology

## 2015-10-11 ENCOUNTER — Encounter (HOSPITAL_BASED_OUTPATIENT_CLINIC_OR_DEPARTMENT_OTHER): Payer: 59

## 2015-10-11 ENCOUNTER — Encounter: Payer: Self-pay | Admitting: Cardiology

## 2015-10-11 VITALS — BP 160/90 | HR 73 | Ht 67.0 in | Wt 319.4 lb

## 2015-10-11 DIAGNOSIS — I480 Paroxysmal atrial fibrillation: Secondary | ICD-10-CM

## 2015-10-11 DIAGNOSIS — I4891 Unspecified atrial fibrillation: Secondary | ICD-10-CM

## 2015-10-11 DIAGNOSIS — I5022 Chronic systolic (congestive) heart failure: Secondary | ICD-10-CM

## 2015-10-11 DIAGNOSIS — I5032 Chronic diastolic (congestive) heart failure: Secondary | ICD-10-CM | POA: Insufficient documentation

## 2015-10-11 DIAGNOSIS — I272 Other secondary pulmonary hypertension: Secondary | ICD-10-CM

## 2015-10-11 DIAGNOSIS — I10 Essential (primary) hypertension: Secondary | ICD-10-CM

## 2015-10-11 DIAGNOSIS — I420 Dilated cardiomyopathy: Secondary | ICD-10-CM | POA: Diagnosis not present

## 2015-10-11 DIAGNOSIS — I4819 Other persistent atrial fibrillation: Secondary | ICD-10-CM | POA: Insufficient documentation

## 2015-10-11 HISTORY — DX: Chronic diastolic (congestive) heart failure: I50.32

## 2015-10-11 MED ORDER — METOPROLOL SUCCINATE ER 25 MG PO TB24: 25.0000 mg | ORAL_TABLET | Freq: Every day | ORAL | Status: DC

## 2015-10-11 MED ORDER — DILTIAZEM HCL ER COATED BEADS 240 MG PO CP24: 240.0000 mg | ORAL_CAPSULE | Freq: Every day | ORAL | Status: DC

## 2015-10-11 MED ORDER — FUROSEMIDE 40 MG PO TABS: 40.0000 mg | ORAL_TABLET | Freq: Two times a day (BID) | ORAL | Status: DC

## 2015-10-11 MED ORDER — RIVAROXABAN 20 MG PO TABS: 20.0000 mg | ORAL_TABLET | Freq: Every day | ORAL | Status: DC

## 2015-10-11 MED ORDER — AMIODARONE HCL 200 MG PO TABS: 200.0000 mg | ORAL_TABLET | Freq: Every day | ORAL | Status: DC

## 2015-10-11 MED ORDER — POTASSIUM CHLORIDE CRYS ER 20 MEQ PO TBCR: 20.0000 meq | EXTENDED_RELEASE_TABLET | Freq: Two times a day (BID) | ORAL | Status: DC

## 2015-10-11 MED ORDER — AMIODARONE HCL 100 MG PO TABS: 200.0000 mg | ORAL_TABLET | Freq: Every day | ORAL | Status: DC

## 2015-10-11 NOTE — Addendum Note (Signed)
Addended by: Harland German A on: 10/11/2015 03:20 PM   Modules accepted: Orders

## 2015-10-11 NOTE — Progress Notes (Signed)
Cardiology Office Note   Date:  10/11/2015   ID:  Amber Medina, DOB 05-05-1951, MRN [id number]  PCP:  No primary care provider on file.    Chief Complaint  Patient presents with  . Atrial Fibrillation  . Congestive Heart Failure      History of Present Illness: Amber Medina is a 65 y.o. female who presents for hospital followup of her atrial fibrillation.  She was recently admitted to [facility] for afib with RVR.  She also was found to be in acute systolic CHF and 2D echo showed mildly reduced LVF and right heart failure as well with possible acute cor pulonale.  She was diuresed.  An outpt sleep study was ordered through pulmonary.  She was started on  Cardizem, BB and Amio for rate control and started on Xarelto.  Plan was to wait 4 weeks and plan outpt DCCV.  She is doing well today. She denies any chest pain, dizziness, palpitations or syncope.  Her SOB has pretty much resolved and only has DOE with extreme exertion.  She only has LE edema after sitting at her computer for a long period of time.  She plans to start at the gym.     Past Medical History  Diagnosis Date  . Morbid obesity ([place])   . Snoring   . Chronic systolic CHF (congestive heart failure) ([place]) 10/11/2015  . PAF (paroxysmal atrial fibrillation) ([place]) 10/11/2015  . Pulmonary HTN ([place]) 10/11/2015    mild    Past Surgical History  Procedure Laterality Date  . Tonsillectomy       Current Outpatient Prescriptions  Medication Sig Dispense Refill  . amiodarone (PACERONE) 200 MG tablet Take 1 tablet (200 mg total) by mouth 2 (two) times daily. 60 tablet 0  . Ascorbic Acid (VITAMIN C) 1000 MG tablet Take 2,000 mg by mouth daily.    Marland Kitchen b complex vitamins tablet Take 1 tablet by mouth daily.    . Cholecalciferol (VITAMIN D PO) Take by mouth.    . diltiazem (CARDIZEM CD) 240 MG 24 hr capsule Take 1 capsule (240 mg total) by mouth daily. 60 capsule 0  . diphenhydrAMINE (BENADRYL) 25 mg capsule Take 25 mg by mouth  every 6 (six) hours as needed for itching or allergies.    . furosemide (LASIX) 40 MG tablet Take 1 tablet (40 mg total) by mouth 2 (two) times daily. 60 tablet 0  . Hyaluronic Acid-Vitamin C (HYALURONIC ACID PO) Take by mouth.    . metoprolol succinate (TOPROL-XL) 25 MG 24 hr tablet Take 1 tablet (25 mg total) by mouth daily. 30 tablet 0  . Multiple Vitamin (MULTIVITAMIN WITH MINERALS) TABS tablet Take 1 tablet by mouth daily.    Marland Kitchen OVER THE COUNTER MEDICATION as directed. Allergy eye drops.    . potassium chloride SA (K-DUR,KLOR-CON) 20 MEQ tablet Take 1 tablet (20 mEq total) by mouth 2 (two) times daily. 60 tablet 0  . rivaroxaban (XARELTO) 20 MG TABS tablet Take 1 tablet (20 mg total) by mouth daily with supper. 60 tablet 0   No current facility-administered medications for this visit.    Allergies:   Compazine    Social History:  The patient  reports that she has quit smoking. She does not have any smokeless tobacco history on file. She reports that she drinks alcohol. She reports that she does not use illicit drugs.   Family History:  The patient's family history includes Congenital heart disease in her mother; Diabetes Mellitus II in her mother; Hypertension in her father and mother.    ROS:  Please see the history of present illness.   Otherwise, review of systems are positive for none.   All other systems are reviewed and negative.    PHYSICAL EXAM: VS:  BP 160/90 mmHg  Pulse 73  Ht 5\' 7"  (1.702 m)  Wt 319 lb 6.4 oz (144.879 kg)  BMI 50.01 kg/m2  SpO2 97% , BMI Body mass index is 50.01 kg/(m^2). GEN: Well nourished, well developed, in no acute distress HEENT: normal Neck: no JVD, carotid bruits, or masses Cardiac: RRR; no murmurs, rubs, or gallops,no edema  Respiratory:  clear to auscultation bilaterally, normal work of breathing GI: soft, nontender, nondistended, + BS MS: no deformity or atrophy Skin: warm and dry, no rash Neuro:  Strength and sensation are  intact Psych: euthymic mood, full affect   EKG:  EKG is ordered today. The ekg ordered today demonstrates NSR at 64bpm with nonspecific T wave abnormality and anterior infarct   Recent Labs: 09/06/2015: B Natriuretic Peptide 149.2*; Magnesium 1.8; TSH 0.436 09/07/2015: ALT 34 09/15/2015: BUN 19; Creatinine, Ser 1.23*; Hemoglobin 14.6; Platelets 212; Potassium 4.3; Sodium 135    Lipid Panel    Component Value Date/Time   CHOL 155 09/09/2015 0414   TRIG 116 09/09/2015 0414   HDL 33* 09/09/2015 0414   CHOLHDL 4.7 09/09/2015 0414   VLDL 23 09/09/2015 0414   LDLCALC 99 09/09/2015 0414      Wt Readings from Last 3 Encounters:  10/11/15 319 lb 6.4 oz (144.879 kg)  09/15/15 324 lb 8 oz (147.192 kg)       ASSESSMENT AND PLAN:  1.  Atrial fibrillation with RVR new in onset in past month.  Now back in NSR on BB and CCB.  Her CHADS2VASC score is 2 and is on Xarelto. Continue Amio/CCB/BB/Xarelto.  Decrease Amio to 200mg  daily.  I will recheck her in 3 months and if maintaining NSR will stop Amio given risk for lung toxicity long term.   2. Obesity 3.  Mild pulmonary HTN by echo with RV enlargement and mild RVF.  This is most likely multifactorial from morbid obesity and sleep apnea.  Sleep study is pending.   4.  Mild systolic LV dysfunction ? Related to tachycardia.  Repeat echo now that back in NSR. If LVF still reduced then will plan nuclear stress test to rule out ischemia.  5. Elevated BP - I will get a 24 hour BP monitor   Current medicines are reviewed at length with the patient today.  The patient does not have concerns regarding medicines.  The following changes have been made:  Decrease Amiodarone to 200mg  daily  Labs/ tests ordered today: See above Assessment and Plan No orders of the defined types were placed in this encounter.     Disposition:   FU with me in 3 months  Signed, [name], MD  10/11/2015 11:28 AM    [facility] [place], [place], [place]  [address] Phone: [phone]; Fax: [phone]

## 2015-10-11 NOTE — Patient Instructions (Signed)
Medication Instructions:  Your physician has recommended you make the following change in your medication:  1) DECREASE AMIODARONE to 200 mg daily  Labwork: None  Testing/Procedures: Your physician has requested that you have an echocardiogram. Echocardiography is a painless test that uses sound waves to create images of your heart. It provides your doctor with information about the size and shape of your heart and how well your heart's chambers and valves are working. This procedure takes approximately one hour. There are no restrictions for this procedure.   Dr. Radford Pax recommends you wear a BLOOD PRESSURE MONITOR.  Follow-Up: Your physician recommends that you schedule a follow-up appointment in 3 months with Dr. Radford Pax.  Any Other Special Instructions Will Be Listed Below (If Applicable).     If you need a refill on your cardiac medications before your next appointment, please call your pharmacy.

## 2015-10-17 ENCOUNTER — Ambulatory Visit (INDEPENDENT_AMBULATORY_CARE_PROVIDER_SITE_OTHER): Payer: Medicare (Managed Care)

## 2015-10-17 ENCOUNTER — Encounter: Payer: Self-pay | Admitting: *Deleted

## 2015-10-17 DIAGNOSIS — I10 Essential (primary) hypertension: Secondary | ICD-10-CM | POA: Diagnosis not present

## 2015-10-17 NOTE — Progress Notes (Signed)
Patient ID: Amber Medina, female   DOB: [birth year], 65 y.o.   MRN: [id number] 24 hour ambulatory blood pressure monitor applied to patient.

## 2015-10-18 ENCOUNTER — Telehealth: Payer: Self-pay

## 2015-10-18 NOTE — Telephone Encounter (Signed)
Called [employer]  Pt has no out of network coverage .([place] is out of network) so therefore the echo will not be covered. I then called patient who stated she was

## 2015-10-18 NOTE — Telephone Encounter (Signed)
I then called the patient who is aware that her [employer] does not have out of network benefits.she stated that she is aware of her policy and she realizes that she may acquire  a small  of out of pocket cost but she does have medicare and uhc. The facility was not approved  per [place]  // now awaiting to see if it will be approved through [employer]

## 2015-10-19 ENCOUNTER — Ambulatory Visit (HOSPITAL_COMMUNITY): Payer: Medicare (Managed Care) | Attending: Cardiovascular Disease

## 2015-10-19 ENCOUNTER — Other Ambulatory Visit: Payer: Self-pay

## 2015-10-19 DIAGNOSIS — I480 Paroxysmal atrial fibrillation: Secondary | ICD-10-CM | POA: Diagnosis not present

## 2015-10-19 DIAGNOSIS — Z6841 Body Mass Index (BMI) 40.0 and over, adult: Secondary | ICD-10-CM | POA: Diagnosis not present

## 2015-10-19 DIAGNOSIS — I272 Other secondary pulmonary hypertension: Secondary | ICD-10-CM | POA: Diagnosis not present

## 2015-10-19 DIAGNOSIS — I5022 Chronic systolic (congestive) heart failure: Secondary | ICD-10-CM | POA: Insufficient documentation

## 2015-10-19 DIAGNOSIS — I509 Heart failure, unspecified: Secondary | ICD-10-CM | POA: Diagnosis present

## 2015-10-19 DIAGNOSIS — I420 Dilated cardiomyopathy: Secondary | ICD-10-CM | POA: Diagnosis not present

## 2015-10-19 DIAGNOSIS — I110 Hypertensive heart disease with heart failure: Secondary | ICD-10-CM | POA: Insufficient documentation

## 2015-10-19 DIAGNOSIS — E785 Hyperlipidemia, unspecified: Secondary | ICD-10-CM | POA: Diagnosis not present

## 2015-10-19 DIAGNOSIS — I071 Rheumatic tricuspid insufficiency: Secondary | ICD-10-CM | POA: Insufficient documentation

## 2015-11-19 ENCOUNTER — Telehealth: Payer: Self-pay | Admitting: Cardiology

## 2015-11-19 NOTE — Telephone Encounter (Signed)
24 hour BP monitor showed average BP of 124/85mmHg and HR 61bpm.  Continue current medical therapy.

## 2015-11-20 NOTE — Telephone Encounter (Signed)
Left message to call back  

## 2015-11-20 NOTE — Telephone Encounter (Signed)
Informed patient of results and verbal understanding expressed.  

## 2015-11-28 ENCOUNTER — Ambulatory Visit (HOSPITAL_BASED_OUTPATIENT_CLINIC_OR_DEPARTMENT_OTHER): Payer: 59

## 2015-12-26 ENCOUNTER — Institutional Professional Consult (permissible substitution): Payer: 59 | Admitting: Internal Medicine

## 2016-01-03 ENCOUNTER — Ambulatory Visit: Payer: 59 | Admitting: Cardiology

## 2016-02-05 ENCOUNTER — Ambulatory Visit (INDEPENDENT_AMBULATORY_CARE_PROVIDER_SITE_OTHER): Payer: Medicare (Managed Care) | Admitting: Cardiology

## 2016-02-05 ENCOUNTER — Encounter: Payer: Self-pay | Admitting: Cardiology

## 2016-02-05 VITALS — BP 142/80 | HR 78 | Ht 67.5 in | Wt 326.4 lb

## 2016-02-05 DIAGNOSIS — K5900 Constipation, unspecified: Secondary | ICD-10-CM

## 2016-02-05 DIAGNOSIS — R0981 Nasal congestion: Secondary | ICD-10-CM | POA: Diagnosis not present

## 2016-02-05 DIAGNOSIS — I5032 Chronic diastolic (congestive) heart failure: Secondary | ICD-10-CM

## 2016-02-05 DIAGNOSIS — I480 Paroxysmal atrial fibrillation: Secondary | ICD-10-CM | POA: Diagnosis not present

## 2016-02-05 LAB — CBC WITH DIFFERENTIAL/PLATELET
BASOS PCT: 0 %
Basophils Absolute: 0 cells/uL (ref 0–200)
EOS PCT: 1 %
Eosinophils Absolute: 93 cells/uL (ref 15–500)
HEMATOCRIT: 44 % (ref 35.0–45.0)
HEMOGLOBIN: 15.1 g/dL (ref 11.7–15.5)
LYMPHS ABS: 2139 {cells}/uL (ref 850–3900)
Lymphocytes Relative: 23 %
MCH: 31.2 pg (ref 27.0–33.0)
MCHC: 34.3 g/dL (ref 32.0–36.0)
MCV: 90.9 fL (ref 80.0–100.0)
MONO ABS: 744 {cells}/uL (ref 200–950)
MPV: 8.9 fL (ref 7.5–12.5)
Monocytes Relative: 8 %
NEUTROS ABS: 6324 {cells}/uL (ref 1500–7800)
Neutrophils Relative %: 68 %
Platelets: 259 10*3/uL (ref 140–400)
RBC: 4.84 MIL/uL (ref 3.80–5.10)
RDW: 13.6 % (ref 11.0–15.0)
WBC: 9.3 10*3/uL (ref 3.8–10.8)

## 2016-02-05 LAB — BASIC METABOLIC PANEL
BUN: 40 mg/dL — ABNORMAL HIGH (ref 7–25)
CALCIUM: 9.8 mg/dL (ref 8.6–10.4)
CO2: 23 mmol/L (ref 20–31)
Chloride: 99 mmol/L (ref 98–110)
Creat: 1.55 mg/dL — ABNORMAL HIGH (ref 0.50–0.99)
Glucose, Bld: 128 mg/dL — ABNORMAL HIGH (ref 65–99)
POTASSIUM: 4.7 mmol/L (ref 3.5–5.3)
SODIUM: 138 mmol/L (ref 135–146)

## 2016-02-05 LAB — TSH: TSH: 2.77 m[IU]/L

## 2016-02-05 NOTE — Patient Instructions (Signed)
Medication Instructions:  Your physician recommends that you continue on your current medications as directed. Please refer to the Current Medication list given to you today.   Labwork: TODAY: BMET, CBC, TSH  Testing/Procedures: Your physician has recommended that you have a pulmonary function test. Pulmonary Function Tests are a group of tests that measure how well air moves in and out of your lungs.  Follow-Up: You have been referred to Dr. Wilburn Cornelia for nasal congestion.  Your physician wants you to follow-up in: 6 months with Dr. Radford Pax. You will receive a reminder letter in the mail two months in advance. If you don't receive a letter, please call our office to schedule the follow-up appointment.   Any Other Special Instructions Will Be Listed Below (If Applicable).     If you need a refill on your cardiac medications before your next appointment, please call your pharmacy.

## 2016-02-05 NOTE — Progress Notes (Signed)
Cardiology Office Note    Date:  02/05/2016   ID:  Courtnay Stanback, DOB [birth year], MRN [id number]  PCP:  No primary care provider on file.  Cardiologist:  [name], [place]   Chief Complaint  Patient presents with  . Atrial Fibrillation  . Congestive Heart Failure    History of Present Illness:  Amber Medina is a 65 y.o. female who presents for  followup of her atrial fibrillation and chronic diastolic CHF.  She is on Xarelto for a CHADS2VASC score of 3 and Amio/BB and CCB for suppression of afib. She is doing well today. She denies any chest pain, SOB, DOE (except for extreme exertion), dizziness, palpitations or syncope. She has intermittent LE edema over the course of the day and back down to normal in the am.  She has been complaining of constipation, hair falling out and depression.  She never got her sleep study done due to severe nasal congestion that is ongoing.       Past Medical History  Diagnosis Date  . Morbid obesity ([place])   . Snoring   . Chronic diastolic (congestive) heart failure ([place]) 10/11/2015  . PAF (paroxysmal atrial fibrillation) ([place]) 10/11/2015    Past Surgical History  Procedure Laterality Date  . Tonsillectomy      Current Medications: Outpatient Prescriptions Prior to Visit  Medication Sig Dispense Refill  . amiodarone (PACERONE) 200 MG tablet Take 1 tablet (200 mg total) by mouth daily. 90 tablet 3  . Ascorbic Acid (VITAMIN C) 1000 MG tablet Take 2,000 mg by mouth daily.    Marland Kitchen b complex vitamins tablet Take 1 tablet by mouth daily.    . Cholecalciferol (VITAMIN D PO) Take 1 tablet by mouth daily.     Marland Kitchen diltiazem (CARDIZEM CD) 240 MG 24 hr capsule Take 1 capsule (240 mg total) by mouth daily. 90 capsule 3  . diphenhydrAMINE (BENADRYL) 25 mg capsule Take 25 mg by mouth every 6 (six) hours as needed for itching or allergies.    . furosemide (LASIX) 40 MG tablet Take 1 tablet (40 mg total) by mouth 2 (two) times daily. 180 tablet 3  . Hyaluronic Acid-Vitamin  C (HYALURONIC ACID PO) Take 1 Dose by mouth daily.     . metoprolol succinate (TOPROL-XL) 25 MG 24 hr tablet Take 1 tablet (25 mg total) by mouth daily. 90 tablet 3  . Multiple Vitamin (MULTIVITAMIN WITH MINERALS) TABS tablet Take 1 tablet by mouth daily.    Marland Kitchen OVER THE COUNTER MEDICATION Place 1 drop into both eyes daily as needed (red/itchy eys). Allergy eye drops.    . potassium chloride SA (K-DUR,KLOR-CON) 20 MEQ tablet Take 1 tablet (20 mEq total) by mouth 2 (two) times daily. 180 tablet 3  . rivaroxaban (XARELTO) 20 MG TABS tablet Take 1 tablet (20 mg total) by mouth daily with supper. 30 tablet 11   No facility-administered medications prior to visit.     Allergies:   Compazine   Social History   Social History  . Marital Status: Single    Spouse Name: N/A  . Number of Children: N/A  . Years of Education: N/A   Social History Main Topics  . Smoking status: Former Research scientist (life sciences)  . Smokeless tobacco: None  . Alcohol Use: Yes  . Drug Use: No  . Sexual Activity: Not Asked   Other Topics Concern  . None   Social History Narrative     Family History:  The patient's family history includes Congenital heart  disease in her mother; Diabetes Mellitus II in her mother; Hypertension in her father and mother.   ROS:   Please see the history of present illness.    ROS All other systems reviewed and are negative.   PHYSICAL EXAM:   VS:  BP 142/80 mmHg  Pulse 78  Ht 5' 7.5" (1.715 m)  Wt 326 lb 6.4 oz (148.054 kg)  BMI 50.34 kg/m2   GEN: Well nourished, well developed, in no acute distress HEENT: normal Neck: no JVD, carotid bruits, or masses Cardiac: RRR; no murmurs, rubs, or gallops,no edema.  Intact distal pulses bilaterally.  Respiratory:  clear to auscultation bilaterally, normal work of breathing GI: soft, nontender, nondistended, + BS MS: no deformity or atrophy Skin: warm and dry, no rash Neuro:  Alert and Oriented x 3, Strength and sensation are intact Psych: euthymic  mood, full affect  Wt Readings from Last 3 Encounters:  02/05/16 326 lb 6.4 oz (148.054 kg)  10/11/15 319 lb 6.4 oz (144.879 kg)  09/15/15 324 lb 8 oz (147.192 kg)      Studies/Labs Reviewed:   EKG:  EKG is ordered today.  The ekg ordered today demonstrates sinus bradycardia at 59bpm with no ST changes  Recent Labs: 09/06/2015: B Natriuretic Peptide 149.2*; Magnesium 1.8; TSH 0.436 09/07/2015: ALT 34 09/15/2015: BUN 19; Creatinine, Ser 1.23*; Hemoglobin 14.6; Platelets 212; Potassium 4.3; Sodium 135   Lipid Panel    Component Value Date/Time   CHOL 155 09/09/2015 0414   TRIG 116 09/09/2015 0414   HDL 33* 09/09/2015 0414   CHOLHDL 4.7 09/09/2015 0414   VLDL 23 09/09/2015 0414   LDLCALC 99 09/09/2015 0414    Additional studies/ records that were reviewed today include:  none    ASSESSMENT:    1. Chronic diastolic (congestive) heart failure ([place])   2. PAF (paroxysmal atrial fibrillation) ([place])   3. Morbid obesity, unspecified obesity type ([place])   4. Nasal congestion   5. Constipation, unspecified constipation type      PLAN:  In order of problems listed above:  1. Chronic diastolic CHF - she appears euvolemic on exam although difficult exam due to morbid obesity.  Continue Lasix and BB. 2. PAF - maintaining NSR on BB, Amio and CCB.  Continue Xarelto/Cardizem and Metoprolol.   Check NOAC Panel.  Check PFTs with [facility] due to Amio.  3. Obesity - I have encouraged him to get into a routine exercise program and cut back on carbs and portions. She has not had her PSG done yet due to still having issues with her nasal congestion.  I will refer her to ENT and I have asked her to call me to let me know what Dr. Wilburn Cornelia says so we can get her sleep study set up. 4. Constipation - she is also complaining of loss of hair and depression.  I will check a TSH since she is on Amio.  If normal then recommended she followup with her PCP.   Followup with me in 6 months  Medication  Adjustments/Labs and Tests Ordered: Current medicines are reviewed at length with the patient today.  Concerns regarding medicines are outlined above.  Medication changes, Labs and Tests ordered today are listed in the Patient Instructions below.   Lurena Nida, MD  02/05/2016 12:48 PM    [facility] [place], [place], [place]  [address] Phone: [phone]; Fax: [phone]

## 2016-09-16 ENCOUNTER — Other Ambulatory Visit: Payer: Self-pay | Admitting: Cardiology

## 2016-09-27 ENCOUNTER — Other Ambulatory Visit: Payer: Self-pay | Admitting: Cardiology

## 2016-09-28 ENCOUNTER — Other Ambulatory Visit: Payer: Self-pay | Admitting: Cardiology

## 2016-10-02 ENCOUNTER — Other Ambulatory Visit: Payer: Self-pay | Admitting: Cardiology

## 2016-10-26 ENCOUNTER — Other Ambulatory Visit: Payer: Self-pay | Admitting: Cardiology

## 2016-10-27 ENCOUNTER — Other Ambulatory Visit: Payer: Self-pay | Admitting: *Deleted

## 2016-10-27 MED ORDER — DILTIAZEM HCL ER COATED BEADS 240 MG PO CP24: 240.0000 mg | ORAL_CAPSULE | Freq: Every day | ORAL | 0 refills | Status: DC

## 2016-12-19 ENCOUNTER — Other Ambulatory Visit: Payer: Self-pay | Admitting: Cardiology

## 2016-12-28 ENCOUNTER — Other Ambulatory Visit: Payer: Self-pay | Admitting: Cardiology

## 2016-12-29 ENCOUNTER — Other Ambulatory Visit: Payer: Self-pay | Admitting: Cardiology

## 2016-12-29 NOTE — Telephone Encounter (Signed)
Age 66 Saw Dr Radford Pax on 02/05/2016  Wt 148.1 kg  On 02/05/2016  02/05/2016 Hgb 15.1 HCt 44.0 SrCr 1.55 CrCl 81.21 Refill done for Xarelto 20 mg daily

## 2017-01-20 ENCOUNTER — Other Ambulatory Visit: Payer: Self-pay | Admitting: Cardiology

## 2017-01-20 MED ORDER — DILTIAZEM HCL ER COATED BEADS 240 MG PO CP24: 240.0000 mg | ORAL_CAPSULE | Freq: Every day | ORAL | 0 refills | Status: DC

## 2017-01-21 ENCOUNTER — Other Ambulatory Visit: Payer: Self-pay | Admitting: *Deleted

## 2017-01-21 ENCOUNTER — Telehealth: Payer: Self-pay | Admitting: Cardiology

## 2017-01-21 MED ORDER — FUROSEMIDE 40 MG PO TABS: 40.0000 mg | ORAL_TABLET | Freq: Two times a day (BID) | ORAL | 0 refills | Status: DC

## 2017-01-21 MED ORDER — DILTIAZEM HCL ER COATED BEADS 240 MG PO CP24
240.0000 mg | ORAL_CAPSULE | Freq: Every day | ORAL | 0 refills | Status: DC
Start: 2017-01-21 — End: 2017-02-17

## 2017-01-21 NOTE — Telephone Encounter (Signed)
New message       *STAT* If patient is at the pharmacy, call can be transferred to refill team.   1. Which medications need to be refilled? (please list name of each medication and dose if known)  Furosemide 40mg , diltiazem 240mg   2. Which pharmacy/location (including street and city if local pharmacy) is medication to be sent to?  [facility]. Do they need a 30 day or 90 day supply? 30 day

## 2017-01-28 ENCOUNTER — Encounter: Payer: Self-pay | Admitting: *Deleted

## 2017-02-03 DIAGNOSIS — R942 Abnormal results of pulmonary function studies: Secondary | ICD-10-CM

## 2017-02-03 HISTORY — DX: Abnormal results of pulmonary function studies: R94.2

## 2017-02-09 ENCOUNTER — Telehealth: Payer: Self-pay | Admitting: Cardiology

## 2017-02-09 NOTE — Telephone Encounter (Signed)
Patient reports she received an upsetting message from the Billing department about her upcoming appointment 5/15. She states she was told her insurance does not cover seeing Dr. Radford Pax and that it was her responsibility to cancel appointment and find someone in her network. The patient is originally from [place] and has never been covered here, but states she always pays her bills. She is offended that [facility] no longer wants her business because she pays "out of pocket." Apologized to patient for her frustrations.  Informed her a message would be sent to Billing to give her a call to clarify.  She was grateful for call.

## 2017-02-09 NOTE — Telephone Encounter (Signed)
New Message  Pt call requesting to speak RN. Pt states she is upset with  vm that was left. Please call  Back to discuss if needed.

## 2017-02-17 ENCOUNTER — Ambulatory Visit (INDEPENDENT_AMBULATORY_CARE_PROVIDER_SITE_OTHER): Payer: Medicare (Managed Care) | Admitting: Cardiology

## 2017-02-17 ENCOUNTER — Encounter: Payer: Self-pay | Admitting: Cardiology

## 2017-02-17 VITALS — BP 136/82 | HR 80 | Ht 67.5 in | Wt 320.8 lb

## 2017-02-17 DIAGNOSIS — I5032 Chronic diastolic (congestive) heart failure: Secondary | ICD-10-CM | POA: Diagnosis not present

## 2017-02-17 DIAGNOSIS — I4819 Other persistent atrial fibrillation: Secondary | ICD-10-CM

## 2017-02-17 DIAGNOSIS — I481 Persistent atrial fibrillation: Secondary | ICD-10-CM

## 2017-02-17 MED ORDER — FUROSEMIDE 40 MG PO TABS: 40.0000 mg | ORAL_TABLET | Freq: Two times a day (BID) | ORAL | 3 refills | Status: DC

## 2017-02-17 MED ORDER — DILTIAZEM HCL ER COATED BEADS 240 MG PO CP24: 240.0000 mg | ORAL_CAPSULE | Freq: Every day | ORAL | 3 refills | Status: DC

## 2017-02-17 NOTE — Progress Notes (Signed)
Cardiology Office Note    Date:  02/17/2017   ID:  Amber Medina, DOB 05-Aug-1951, MRN [id number]  PCP:  Patient, No Pcp Per  Cardiologist:  Fransico Him, [place]   Chief Complaint  Patient presents with  . Atrial Fibrillation  . Congestive Heart Failure    History of Present Illness:  Amber Medina is a 66 y.o. female who presents for  followup of her atrial fibrillation and chronic diastolic CHF.  She is on Xarelto for a CHADS2VASC score of 3 and Amio/BB and CCB for suppression of afib. She is doing well today. She denies any chest pain,  PND, orthopnea, dizziness, palpitations or syncope. She has had some problems with plantar fasciitis recently and has been sedentary for quite a while.  She started to try to walk again and noticed that she was getting out of breath easier than before and attributes it to becoming deconditioned after laying around for weeks due to the pain in her feet.   Past Medical History:  Diagnosis Date  . Chronic diastolic (congestive) heart failure ([place]) 10/11/2015  . Morbid obesity ([place])   . PAF (paroxysmal atrial fibrillation) ([place]) 10/11/2015  . Snoring     Past Surgical History:  Procedure Laterality Date  . TONSILLECTOMY      Current Medications: Current Meds  Medication Sig  . amiodarone (PACERONE) 200 MG tablet Take 1 tablet (200 mg total) by mouth daily. *Please call and schedule a one year follow up appointment*  . Cholecalciferol (VITAMIN D PO) Take 1 tablet by mouth daily.   Marland Kitchen diltiazem (CARDIZEM CD) 240 MG 24 hr capsule Take 1 capsule (240 mg total) by mouth daily.  . diphenhydrAMINE (BENADRYL) 25 mg capsule Take 25 mg by mouth every 6 (six) hours as needed for itching or allergies.  . furosemide (LASIX) 40 MG tablet Take 1 tablet (40 mg total) by mouth 2 (two) times daily.  Marland Kitchen Hyaluronic Acid-Vitamin C (HYALURONIC ACID PO) Take 1 Dose by mouth daily.   . metoprolol succinate (TOPROL-XL) 25 MG 24 hr tablet take 1 tablet by mouth once daily  . OVER THE  COUNTER MEDICATION Place 1 drop into both eyes daily as needed (red/itchy eys). Allergy eye drops.  . potassium chloride SA (K-DUR,KLOR-CON) 20 MEQ tablet Take 1 tablet (20 mEq total) by mouth 2 (two) times daily. *Please call and schedule a one year follow up appointment*  . XARELTO 20 MG TABS tablet take 1 tablet by mouth once daily with SUPPER    Allergies:   Compazine [prochlorperazine edisylate]   Social History   Social History  . Marital status: Single    Spouse name: N/A  . Number of children: N/A  . Years of education: N/A   Social History Main Topics  . Smoking status: Former Research scientist (life sciences)  . Smokeless tobacco: Never Used  . Alcohol use Yes  . Drug use: No  . Sexual activity: Not Asked   Other Topics Concern  . None   Social History Narrative  . None     Family History:  The patient's family history includes Congenital heart disease in her mother; Diabetes Mellitus II in her mother; Hypertension in her father and mother.   ROS:   Please see the history of present illness.    ROS All other systems reviewed and are negative.  No flowsheet data found.     PHYSICAL EXAM:   VS:  BP 136/82   Pulse 80   Ht 5' 7.5" (1.715 m)  Wt (!) 320 lb 12.8 oz (145.5 kg)   BMI 49.50 kg/m    GEN: Well nourished, well developed, in no acute distress  HEENT: normal  Neck: no JVD, carotid bruits, or masses Cardiac: RRR; no murmurs, rubs, or gallops,no edema.  Intact distal pulses bilaterally.  Respiratory:  clear to auscultation bilaterally, normal work of breathing GI: soft, nontender, nondistended, + BS MS: no deformity or atrophy  Skin: warm and dry, no rash Neuro:  Alert and Oriented x 3, Strength and sensation are intact Psych: euthymic mood, full affect  Wt Readings from Last 3 Encounters:  02/17/17 (!) 320 lb 12.8 oz (145.5 kg)  02/05/16 (!) 326 lb 6.4 oz (148.1 kg)  10/11/15 (!) 319 lb 6.4 oz (144.9 kg)      Studies/Labs Reviewed:   EKG:  EKG is ordered today.   The ekg ordered today demonstrates NSR at 82bpm with no ST changes and septal infarct unchanged from 2017  Recent Labs: No results found for requested labs within last 8760 hours.   Lipid Panel    Component Value Date/Time   CHOL 155 09/09/2015 0414   TRIG 116 09/09/2015 0414   HDL 33 (L) 09/09/2015 0414   CHOLHDL 4.7 09/09/2015 0414   VLDL 23 09/09/2015 0414   LDLCALC 99 09/09/2015 0414    Additional studies/ records that were reviewed today include:  none    ASSESSMENT:    1. Chronic diastolic (congestive) heart failure (H[facility])   2. Persistent atrial fibrillation ([facility])   3. Obesity, morbid ([place])      PLAN:  In order of problems listed above:  1. Chronic diastolic CHF - she appears euvolemic on exam today and her weight is stable.  She will continue on Lasix 40mg  BID.    2. Persistent atrial fibrillation - she is maintaining NSR.  She will continue on Amio 200mg  daily, Cardizem CD 240mg  daily and Toprol XL 25mg  daily as well as Xarelto 20mg  daily. I will check a BMET, TSH, LFTs  and CBC.  I will also get PFTs with [facility].  3.   Morbid obesity - she never got her sleep study done.  I have encouraged him to get into a routine exercise program and cut back on carbs and portions.     Medication Adjustments/Labs and Tests Ordered: Current medicines are reviewed at length with the patient today.  Concerns regarding medicines are outlined above.  Medication changes, Labs and Tests ordered today are listed in the Patient Instructions below.  There are no Patient Instructions on file for this visit.   Signed, Fransico Him, MD  02/17/2017 11:49 AM    [facility], [place], [place]  [address] Phone: [phone]; Fax: [phone]

## 2017-02-17 NOTE — Patient Instructions (Signed)
Medication Instructions:  Your physician recommends that you continue on your current medications as directed. Please refer to the Current Medication list given to you today.   Labwork: TODAY: BMET, CBC, TSH, LFTs  Testing/Procedures: Your physician has recommended that you have a pulmonary function test. Pulmonary Function Tests are a group of tests that measure how well air moves in and out of your lungs.  Follow-Up: Your physician wants you to follow-up in: 6 months with Dr. Radford Pax. You will receive a reminder letter in the mail two months in advance. If you don't receive a letter, please call our office to schedule the follow-up appointment.   Any Other Special Instructions Will Be Listed Below (If Applicable).     If you need a refill on your cardiac medications before your next appointment, please call your pharmacy.

## 2017-02-18 LAB — HEPATIC FUNCTION PANEL
ALT: 15 IU/L (ref 0–32)
AST: 18 IU/L (ref 0–40)
Albumin: 4.4 g/dL (ref 3.6–4.8)
Alkaline Phosphatase: 109 IU/L (ref 39–117)
BILIRUBIN, DIRECT: 0.12 mg/dL (ref 0.00–0.40)
Bilirubin Total: 0.4 mg/dL (ref 0.0–1.2)
TOTAL PROTEIN: 7.5 g/dL (ref 6.0–8.5)

## 2017-02-18 LAB — CBC WITH DIFFERENTIAL/PLATELET
BASOS ABS: 0 10*3/uL (ref 0.0–0.2)
BASOS: 0 %
EOS (ABSOLUTE): 0.1 10*3/uL (ref 0.0–0.4)
Eos: 1 %
Hematocrit: 35.3 % (ref 34.0–46.6)
Hemoglobin: 10.9 g/dL — ABNORMAL LOW (ref 11.1–15.9)
Immature Grans (Abs): 0.1 10*3/uL (ref 0.0–0.1)
Immature Granulocytes: 1 %
LYMPHS ABS: 1.6 10*3/uL (ref 0.7–3.1)
Lymphs: 15 %
MCH: 24.2 pg — AB (ref 26.6–33.0)
MCHC: 30.9 g/dL — ABNORMAL LOW (ref 31.5–35.7)
MCV: 78 fL — ABNORMAL LOW (ref 79–97)
Monocytes Absolute: 0.8 10*3/uL (ref 0.1–0.9)
Monocytes: 7 %
NEUTROS ABS: 8.2 10*3/uL — AB (ref 1.4–7.0)
Neutrophils: 76 %
PLATELETS: 428 10*3/uL — AB (ref 150–379)
RBC: 4.5 x10E6/uL (ref 3.77–5.28)
RDW: 17.1 % — ABNORMAL HIGH (ref 12.3–15.4)
WBC: 10.9 10*3/uL — ABNORMAL HIGH (ref 3.4–10.8)

## 2017-02-18 LAB — BASIC METABOLIC PANEL
BUN / CREAT RATIO: 15 (ref 12–28)
BUN: 22 mg/dL (ref 8–27)
CALCIUM: 9.6 mg/dL (ref 8.7–10.3)
CHLORIDE: 97 mmol/L (ref 96–106)
CO2: 21 mmol/L (ref 18–29)
Creatinine, Ser: 1.45 mg/dL — ABNORMAL HIGH (ref 0.57–1.00)
GFR calc non Af Amer: 37 mL/min/{1.73_m2} — ABNORMAL LOW (ref >59)
GFR, EST AFRICAN AMERICAN: 43 mL/min/{1.73_m2} — AB (ref >59)
Glucose: 181 mg/dL — ABNORMAL HIGH (ref 65–99)
POTASSIUM: 4.9 mmol/L (ref 3.5–5.2)
SODIUM: 140 mmol/L (ref 134–144)

## 2017-02-18 LAB — TSH: TSH: 3.01 u[IU]/mL (ref 0.450–4.500)

## 2017-02-19 ENCOUNTER — Telehealth: Payer: Self-pay | Admitting: Cardiology

## 2017-02-19 DIAGNOSIS — I5032 Chronic diastolic (congestive) heart failure: Secondary | ICD-10-CM

## 2017-02-19 MED ORDER — FUROSEMIDE 40 MG PO TABS: 40.0000 mg | ORAL_TABLET | Freq: Every day | ORAL | 3 refills | Status: DC

## 2017-02-19 NOTE — Telephone Encounter (Signed)
Follow up    Pt is returning call to [facility] about lab results.

## 2017-02-19 NOTE — Telephone Encounter (Signed)
-----   Message from [name], [place] sent at 02/19/2017 12:34 PM EDT ----- Her creatinine has bumped so please have her decrease Lasix to 40mg  daily and repeat BMET in 1 week

## 2017-02-19 NOTE — Telephone Encounter (Signed)
Informed patient of results and verbal understanding expressed.  Instructed patient to DECREASE LASIX to 40 mg daily. BMET scheduled next Friday. Patient does not have a PCP here. Per Dr. Radford Pax, instructed patient to go to [facility] for evaluation since Hgb has dropped and she is on blood thinner. Patient agrees with treatment plan.

## 2017-02-19 NOTE — Telephone Encounter (Signed)
New message ° ° ° ° ° °Returning a call to get lab results °

## 2017-02-25 ENCOUNTER — Ambulatory Visit (INDEPENDENT_AMBULATORY_CARE_PROVIDER_SITE_OTHER): Payer: Medicare (Managed Care) | Admitting: Internal Medicine

## 2017-02-25 DIAGNOSIS — I481 Persistent atrial fibrillation: Secondary | ICD-10-CM

## 2017-02-25 DIAGNOSIS — I4819 Other persistent atrial fibrillation: Secondary | ICD-10-CM

## 2017-02-25 LAB — PULMONARY FUNCTION TEST
DL/VA % PRED: 81 %
DL/VA: 3.89 ml/min/mmHg/L
DLCO COR % PRED: 69 %
DLCO COR: 16.81 ml/min/mmHg
DLCO UNC: 16.55 ml/min/mmHg
DLCO unc % pred: 68 %
FEF 25-75 POST: 0.99 L/s
FEF 25-75 Pre: 0.8 L/sec
FEF2575-%Change-Post: 24 %
FEF2575-%PRED-PRE: 40 %
FEF2575-%Pred-Post: 50 %
FEV1-%Change-Post: 1 %
FEV1-%PRED-PRE: 63 %
FEV1-%Pred-Post: 64 %
FEV1-Post: 1.49 L
FEV1-Pre: 1.47 L
FEV1FVC-%Change-Post: 3 %
FEV1FVC-%PRED-PRE: 93 %
FEV6-%CHANGE-POST: 0 %
FEV6-%PRED-POST: 69 %
FEV6-%PRED-PRE: 69 %
FEV6-POST: 2.03 L
FEV6-Pre: 2.04 L
FEV6FVC-%CHANGE-POST: 0 %
FEV6FVC-%PRED-POST: 104 %
FEV6FVC-%Pred-Pre: 103 %
FVC-%Change-Post: -1 %
FVC-%PRED-POST: 66 %
FVC-%Pred-Pre: 67 %
FVC-POST: 2.03 L
FVC-PRE: 2.07 L
POST FEV6/FVC RATIO: 100 %
PRE FEV1/FVC RATIO: 71 %
Post FEV1/FVC ratio: 74 %
Pre FEV6/FVC Ratio: 99 %
RV % pred: 197 %
RV: 4.25 L
TLC % PRED: 128 %
TLC: 6.49 L

## 2017-02-25 NOTE — Progress Notes (Signed)
PFT done today. Katie Welchel,CMA  

## 2017-02-27 ENCOUNTER — Other Ambulatory Visit: Payer: Medicare (Managed Care) | Admitting: *Deleted

## 2017-02-27 DIAGNOSIS — I5032 Chronic diastolic (congestive) heart failure: Secondary | ICD-10-CM

## 2017-02-28 LAB — BASIC METABOLIC PANEL
BUN/Creatinine Ratio: 16 (ref 12–28)
BUN: 21 mg/dL (ref 8–27)
CHLORIDE: 105 mmol/L (ref 96–106)
CO2: 19 mmol/L (ref 18–29)
Calcium: 9.2 mg/dL (ref 8.7–10.3)
Creatinine, Ser: 1.28 mg/dL — ABNORMAL HIGH (ref 0.57–1.00)
GFR, EST AFRICAN AMERICAN: 50 mL/min/{1.73_m2} — AB (ref >59)
GFR, EST NON AFRICAN AMERICAN: 43 mL/min/{1.73_m2} — AB (ref >59)
Glucose: 129 mg/dL — ABNORMAL HIGH (ref 65–99)
POTASSIUM: 4.6 mmol/L (ref 3.5–5.2)
SODIUM: 141 mmol/L (ref 134–144)

## 2017-03-03 ENCOUNTER — Telehealth: Payer: Self-pay | Admitting: Cardiology

## 2017-03-03 NOTE — Telephone Encounter (Signed)
New Message    Pt is concerned with taking  amiodarone (PACERONE) 200 MG tablet Take 1 tablet (200 mg total) by mouth daily. *Please call and schedule a one year follow up appointment*   She is not comfortable taking this anymore.   Pt would also like her results

## 2017-03-03 NOTE — Telephone Encounter (Signed)
Walk In pt Form-message left for [facility]. Placed in [facility] doc box.

## 2017-03-03 NOTE — Telephone Encounter (Signed)
Refer to afib clinic 

## 2017-03-03 NOTE — Telephone Encounter (Signed)
Patient called to request Dr. Radford Pax change her amiodarone to a different medication. She had a really close friend pass away this weekend and her death was attributed to amiodarone toxicity and she is scared to continue taking the drug. Since her friend's death, she has done research and simply does not want to continue. She has not stopped amiodarone yet, but does request recommendations to change.  To Dr. Radford Pax.

## 2017-03-04 NOTE — Telephone Encounter (Signed)
Patient understands Afib Clinic will call her to schedule appointment to discuss amiodarone alternatives. She was grateful for call and agrees with treatment plan.

## 2017-03-04 NOTE — Telephone Encounter (Signed)
Left message to return call to make appt.

## 2017-03-04 NOTE — Telephone Encounter (Signed)
Mrs. Howser is returning your call . Thanks

## 2017-03-10 ENCOUNTER — Ambulatory Visit (HOSPITAL_COMMUNITY)
Admission: RE | Admit: 2017-03-10 | Discharge: 2017-03-10 | Disposition: A | Discharge status: Home / Self Care | Payer: Medicare (Managed Care) | Source: Ambulatory Visit | Attending: Nurse Practitioner | Admitting: Nurse Practitioner

## 2017-03-10 ENCOUNTER — Encounter (HOSPITAL_COMMUNITY): Payer: Self-pay | Admitting: Nurse Practitioner

## 2017-03-10 VITALS — BP 132/60 | HR 74 | Ht 67.5 in | Wt 320.8 lb

## 2017-03-10 DIAGNOSIS — I5032 Chronic diastolic (congestive) heart failure: Secondary | ICD-10-CM | POA: Insufficient documentation

## 2017-03-10 DIAGNOSIS — Z87891 Personal history of nicotine dependence: Secondary | ICD-10-CM | POA: Insufficient documentation

## 2017-03-10 DIAGNOSIS — Z79899 Other long term (current) drug therapy: Secondary | ICD-10-CM | POA: Insufficient documentation

## 2017-03-10 DIAGNOSIS — I4891 Unspecified atrial fibrillation: Secondary | ICD-10-CM | POA: Diagnosis present

## 2017-03-10 DIAGNOSIS — I481 Persistent atrial fibrillation: Secondary | ICD-10-CM | POA: Diagnosis not present

## 2017-03-10 DIAGNOSIS — Z7901 Long term (current) use of anticoagulants: Secondary | ICD-10-CM | POA: Diagnosis not present

## 2017-03-10 DIAGNOSIS — R0683 Snoring: Secondary | ICD-10-CM | POA: Insufficient documentation

## 2017-03-10 DIAGNOSIS — I4819 Other persistent atrial fibrillation: Secondary | ICD-10-CM

## 2017-03-10 NOTE — Progress Notes (Signed)
Primary Care Physician: Patient, No Pcp Per Referring Physician: Dr. Radford Pax   Amber Medina is a 66 y.o. female with a h/o afib with RVR in 09/2015 associated with acute systolic heart failure with rt sided heart failure. She was 357 lbs on admission and was diuresed to 324 lbs.  She  was placed on amiodarone and has been in SR since then. She is in the afib clinic for options to stay in SR other than amiodarone because her friend in [place] died quickly and she was on amiodarone.   She is inactive and obese. Has a snoring history and cancelled her sleep study last year due to a cold and did not reschedule. She does live alone so does not know if she has apnea spells. Denies alcohol or caffeine. Prior smoker. Recently had labs with normal tsh and liver, PFT's pending.   Today, she denies symptoms of palpitations, chest pain, shortness of breath, orthopnea, PND, lower extremity edema, dizziness, presyncope, syncope, or neurologic sequela. The patient is tolerating medications without difficulties and is otherwise without complaint today.   Past Medical History:  Diagnosis Date  . Chronic diastolic (congestive) heart failure ([place]) 10/11/2015  . Morbid obesity ([place])   . PAF (paroxysmal atrial fibrillation) ([place]) 10/11/2015  . Snoring    Past Surgical History:  Procedure Laterality Date  . TONSILLECTOMY      Current Outpatient Prescriptions  Medication Sig Dispense Refill  . amiodarone (PACERONE) 200 MG tablet Take 1 tablet (200 mg total) by mouth daily. *Please call and schedule a one year follow up appointment* 90 tablet 0  . diltiazem (CARDIZEM CD) 240 MG 24 hr capsule Take 1 capsule (240 mg total) by mouth daily. 90 capsule 3  . diphenhydrAMINE (BENADRYL) 25 mg capsule Take 25 mg by mouth every 6 (six) hours as needed for itching or allergies.    . furosemide (LASIX) 40 MG tablet Take 1 tablet (40 mg total) by mouth daily. 90 tablet 3  . Hyaluronic Acid-Vitamin C (HYALURONIC ACID PO) Take  1 Dose by mouth daily.     . metoprolol succinate (TOPROL-XL) 25 MG 24 hr tablet take 1 tablet by mouth once daily 90 tablet 3  . OVER THE COUNTER MEDICATION Place 1 drop into both eyes daily as needed (red/itchy eys). Allergy eye drops.    . potassium chloride SA (K-DUR,KLOR-CON) 20 MEQ tablet Take 1 tablet (20 mEq total) by mouth 2 (two) times daily. *Please call and schedule a one year follow up appointment* 180 tablet 0  . XARELTO 20 MG TABS tablet take 1 tablet by mouth once daily with SUPPER 90 tablet 1  . Cholecalciferol (VITAMIN D PO) Take 1 tablet by mouth daily.      No current facility-administered medications for this encounter.     Allergies  Allergen Reactions  . Compazine [Prochlorperazine Edisylate] Swelling    Social History   Social History  . Marital status: Single    Spouse name: N/A  . Number of children: N/A  . Years of education: N/A   Occupational History  . Not on file.   Social History Main Topics  . Smoking status: Former Research scientist (life sciences)  . Smokeless tobacco: Never Used  . Alcohol use Yes  . Drug use: No  . Sexual activity: Not on file   Other Topics Concern  . Not on file   Social History Narrative  . No narrative on file    Family History  Problem Relation Age of Onset  .  Diabetes Mellitus II Mother   . Hypertension Mother   . Congenital heart disease Mother   . Hypertension Father     ROS- All systems are reviewed and negative except as per the HPI above  Physical Exam: Vitals:   03/10/17 1508  BP: 132/60  Pulse: 74  Weight: (!) 320 lb 12.8 oz (145.5 kg)  Height: 5' 7.5" (1.715 m)   Wt Readings from Last 3 Encounters:  03/10/17 (!) 320 lb 12.8 oz (145.5 kg)  02/17/17 (!) 320 lb 12.8 oz (145.5 kg)  02/05/16 (!) 326 lb 6.4 oz (148.1 kg)    Labs: Lab Results  Component Value Date   NA 141 02/27/2017   K 4.6 02/27/2017   CL 105 02/27/2017   CO2 19 02/27/2017   GLUCOSE 129 (H) 02/27/2017   BUN 21 02/27/2017   CREATININE 1.28 (H)  02/27/2017   CALCIUM 9.2 02/27/2017   PHOS 4.2 09/07/2015   MG 1.8 09/06/2015   Lab Results  Component Value Date   INR 1.22 09/06/2015   Lab Results  Component Value Date   CHOL 155 09/09/2015   HDL 33 (L) 09/09/2015   LDLCALC 99 09/09/2015   TRIG 116 09/09/2015     GEN- The patient is well appearing, alert and oriented x 3 today.   Head- normocephalic, atraumatic Eyes-  Sclera clear, conjunctiva pink Ears- hearing intact Oropharynx- clear Neck- supple, no JVP Lymph- no cervical lymphadenopathy Lungs- Clear to ausculation bilaterally, normal work of breathing Heart- Regular rate and rhythm, no murmurs, rubs or gallops, PMI not laterally displaced GI- soft, NT, ND, + BS Extremities- no clubbing, cyanosis, or edema MS- no significant deformity or atrophy Skin- no rash or lesion Psych- euthymic mood, full affect Neuro- strength and sensation are intact  EKG-NSR at 74 bpm, pr int 180 ms, qrs int 82 ms, qtc 439 ms Epic records reviewed Echo-Study Conclusions  - Left ventricle: The cavity size was normal. There was mild   concentric hypertrophy. Systolic function was normal. The   estimated ejection fraction was in the range of 60% to 65%. Wall   motion was normal; there were no regional wall motion   abnormalities. Doppler parameters are consistent with abnormal   left ventricular relaxation (grade 1 diastolic dysfunction).   Doppler parameters are consistent with high ventricular filling   pressure. - Right ventricle: The cavity size was normal. Wall thickness was   normal. - Tricuspid valve: There was mild regurgitation. - Inferior vena cava: The vessel was normal in size. The   respirophasic diameter changes were in the normal range (>= 50%),   consistent with normal central venous pressure. PFT's pending  Assessment and Plan: 1. afib  Wishes to come off amiodarone Discussed options I think sotalol or Tikosyn would be her best options She would have to let  amiodarone wash out  for several months to get below 2.0 She will check on [place] of Tikosyn to see if affordable for her Her weight is a negative for being a good candidate for ablation Encouraged to have sleep study as initially planned for snoring history Encouraged to be more active to help with weight loss Continue xarelto for a chadsvasc score of at least 3  She will consider her options and get back to me re her decision  Butch Penny C. Kymora Sciara, [facility] [address] [place], [place] [address] [phone]

## 2017-03-26 ENCOUNTER — Other Ambulatory Visit: Payer: Self-pay | Admitting: Cardiology

## 2017-03-27 ENCOUNTER — Telehealth: Payer: Self-pay | Admitting: Cardiology

## 2017-03-27 NOTE — Telephone Encounter (Signed)
New message       Pt c/o medication issue:  1. Name of Medication:  furosemide  2. How are you currently taking this medication (dosage and times per day)?  40mg  daily 3. Are you having a reaction (difficulty breathing--STAT)?  no  4. What is your medication issue?  Since cutting medication in half 1 month ago, patient have noticed that her left ankle, arm and hand swells and does not go down with elevation.  When she took more furosemide, swelling went down.  Medication was cut in half because of abn labs.  Please advise

## 2017-03-27 NOTE — Telephone Encounter (Signed)
Patient is having swelling in her ankles and wants to know if she can take an extra lasix. Patient denies any pain or SOB.  Informed patient that sometimes doctors will prescribe lasix PRN to take for edema. Informed patient the reason her lasix was decreased was due to her lab work and it had improved with the reduced dose.  Informed patient that she should be weighing herself daily to make sure she is not gaining more than 3 lbs in one day or 5 lbs in one week. Informed patient to lower her salt intake, keep hydrated in this heat, and to elevated her legs when sitting down. Informed patient that her message would be sent to Dr. Radford Pax for further advisement.

## 2017-03-28 NOTE — Telephone Encounter (Signed)
Please give her a Rx for compression hose knee high to see if edema resolves.  I would prefer she not take an extra lasix unless her weight increases by 3lbs in a day and if weight doesn't go back down with the extra lasix she needs to call the office

## 2017-03-30 NOTE — Telephone Encounter (Signed)
Spoke with patient who is reporting her edema is better in her feet and legs.  She did mention her left hand and arm being puffy but stated it has been that way for awhile now.  She denies any pain or pitting edema, no discoloration.  It has not changed at all since last week.  She is aware of Dr Theodosia Blender recommendation to wear knee high compression hose and weigh daily.  She is aware of when to take Lasix.  She will call back with any questions or concerns

## 2017-05-07 ENCOUNTER — Telehealth: Payer: Self-pay | Admitting: Cardiology

## 2017-05-07 MED ORDER — POTASSIUM CHLORIDE CRYS ER 20 MEQ PO TBCR: 20.0000 meq | EXTENDED_RELEASE_TABLET | Freq: Two times a day (BID) | ORAL | 0 refills | Status: DC

## 2017-05-07 NOTE — Telephone Encounter (Signed)
Pt c/o medication issue:  1. Name of Medication: Potassium  2. How are you currently taking this medication (dosage and times per day)? 20 MEQ BID  3. Are you having a reaction (difficulty breathing--STAT)? No  4. What is your medication issue? Pt just realized her dosage was changed to qd from bid, has been taking bid, wants to make sure this is correct or was it an error before she starts taking qd

## 2017-05-07 NOTE — Telephone Encounter (Signed)
Matter has already been addressed.

## 2017-05-07 NOTE — Telephone Encounter (Signed)
Pt's medication was sent in wrong. I resent pt's medication potassium 20 meq taking 1 tablet BID, per LOV note with Roderic Palau, NP. Confirmation received.

## 2017-06-02 ENCOUNTER — Telehealth: Payer: Self-pay | Admitting: Cardiology

## 2017-06-02 NOTE — Telephone Encounter (Signed)
Patient reports worsening SOB over the last several weeks. "Some days are good, and some days are bad." Sometimes, she can walk all over the grocery store with no issues. Others she can barely walk to the bathroom. Her daughter is a Marine scientist and thinks her potassium may be low because she looked a little yellow last time she saw her and she was weak. Confirmed with patient she has no cramping or palpitations or fluttering. Scheduled patient for follow-up with D. Dunn next week. She was offered an earlier appointment but is and will be out of town until then.  Informed patient that her PFTs were not sent to Dr. Radford Pax by Pulmonary. She understands a message has been sent to Dr. Annamaria Boots to finalize report.  Provided patient [facility] number to call to establish with a PCP as well.  Instructed patient to seek emergency medical attention if symptoms get workse while she is out of town.  She was grateful for call and agrees with treatment plan.

## 2017-06-02 NOTE — Telephone Encounter (Signed)
Pt would like to results from her last LAB & Breathing test please.  Pt would like lab work on her Potasium and Iron please.  Pt would also like a temporary Handicap card.

## 2017-06-10 ENCOUNTER — Encounter: Payer: Self-pay | Admitting: Physician Assistant

## 2017-06-10 NOTE — Progress Notes (Addendum)
Cardiology Office Note    Date:  06/11/2017  ID:  Amber Medina, DOB Dec 05, 1950, MRN [id number] PCP:  Patient, No Pcp Per  Cardiologist:  Dr. Radford Pax   Chief Complaint: shortness of breath  History of Present Illness:  Amber Medina is a 66 y.o. 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  Per review of chart, she has history of initial diagnosis of atrial fib in 09/2015 with acute systolic CHF/RV failure felt secondary to obesity,EF unable to be assessed due to obesity. She also had bradycardia/sinus pauses with hypoxemia felt due to sleep apnea. EP saw patient and felt brief sinus pauses were due to respiratory failure and did not feel she required PPM. She also had finding of low-density lesion in the left occipital lobe on CT head. CT head report this area is either subacute CVA or encephalomalacia. IM suspected latter as no strokelike symptoms were reported; patient refused MRI due to claustrophobia. She was placed on rate controlling agents and amiodarone for suppression of atrial fib. Sleep study was strongly recommended but the patient did not comply. 2D echo 10/2015 showed EF 60-65%, grade 1 DD, high ventricular filling pressure, mild TR. Last PFTs 02/2017 were abnormal prompting referral to pulmonology ("A reduced diffusing capacity, moderate airway obstruction and overinflation are characteristic of emphysema.") Last labs 02/2017 showed normal TSH, LFTS, Hgb 10.9, MCV 78 (prior values 14-15), plt 428, Cr 1.45 -> 1.28 following decrease in Lasix (prior baseline 1.2-1.5).  She presents back to clinic for follow-up today reporting progressive SOB over the last 3 months. She denies any chest pain or palpitations. She becomes very fatigued with any amount of activity, even minimal walking. She was sedentary  before but this has decreased her activity even further. She has not noticed any desaturations with activity at home. Pulse ox 97% here. Initial BP 150/80s with recheck 112/62 by me with large cuff. No significant LEE, but has large baseline leg habitus. Denies any bleeding whatsoever including melena, BRBPR, hematemesis, hematuria. She chews ice regularly but this is chronic. Has chronic orthopnea. Weight is within 2lb of prior visit. She reports eating increased amount of food recently following a conference she attended. Reports that every time she went to schedule sleep study, a different issue would come up causing her to have to reschedule, such as sinus congestion. Smoked for "forever" but quit in 2016.  Addendum 06/11/2017 2:28 PM after visit: See below regarding critical hemoglobin of 5.4.  Past Medical History:  Diagnosis Date  . Abnormal pulmonary function test 02/2017  . Chronic diastolic (congestive) heart failure ([place]) 10/11/2015  . CKD (chronic kidney disease), stage II   . Microcytic anemia    by labs 02/2017  . Morbid obesity ([place])   . Nocturnal hypoxemia   . Persistent atrial fibrillation ([place])   . Snoring   . Suspected sleep apnea     Past Surgical History:  Procedure Laterality Date  . TONSILLECTOMY      Current Medications: No outpatient prescriptions have been marked as taking for the 06/11/17 encounter (Office Visit) with Charlie Pitter, PA-C.     Allergies:   Compazine [prochlorperazine edisylate]   Social History   Social History  . Marital status: Single    Spouse name: N/A  . Number of children: N/A  . Years of education: N/A  Social History Main Topics  . Smoking status: Former Research scientist (life sciences)  . Smokeless tobacco: Never Used  . Alcohol use Yes  . Drug use: No  . Sexual activity: Not Asked   Other Topics Concern  . None   Social History Narrative  . None     Family History:  Family History  Problem Relation Age of Onset  . Diabetes Mellitus II Mother    . Hypertension Mother   . Congenital heart disease Mother   . Hypertension Father     ROS:   Please see the history of present illness.  All other systems are reviewed and otherwise negative.    PHYSICAL EXAM:   VS:  BP (!) 150/98   Pulse 64   Ht 5\' 6"  (1.676 m)   Wt (!) 322 lb (146.1 kg)   LMP  (LMP Unknown)   SpO2 97%   BMI 51.97 kg/m   BMI: Body mass index is 51.97 kg/m. Recheck BP 112/62 GEN: Well nourished, well developed super morbidly obese WF, in no acute distress  HEENT: normocephalic, atraumatic Neck: no JVD, carotid bruits, or masses Cardiac: RRR; no murmurs, rubs, or gallops, trace pedal edema nonpitting, large baseline leg habitus Respiratory: diminished BS throughout but otherwise clear to auscultation bilaterally, normal work of breathing at rest but increased WOB with activity. GI: soft, nontender, nondistended, + BS MS: no deformity or atrophy  Skin: warm and dry, no rash Neuro:  Alert and Oriented x 3, Strength and sensation are intact, follows commands Psych: euthymic mood, full affect  Wt Readings from Last 3 Encounters:  06/11/17 (!) 322 lb (146.1 kg)  03/10/17 (!) 320 lb 12.8 oz (145.5 kg)  02/17/17 (!) 320 lb 12.8 oz (145.5 kg)      Studies/Labs Reviewed:   EKG:  EKG was ordered today and personally reviewed by me and demonstrates NSR 64bpm, nonspecific ST-T changes with lower voltage QRS likely due to obesity, subtle ST sagging in I, avL, V6  Recent Labs: 02/17/2017: ALT 15; Hemoglobin 10.9; Platelets 428; TSH 3.010 02/27/2017: BUN 21; Creatinine, Ser 1.28; Potassium 4.6; Sodium 141   Lipid Panel    Component Value Date/Time   CHOL 155 09/09/2015 0414   TRIG 116 09/09/2015 0414   HDL 33 (L) 09/09/2015 0414   CHOLHDL 4.7 09/09/2015 0414   VLDL 23 09/09/2015 0414   LDLCALC 99 09/09/2015 0414    Additional studies/ records that were reviewed today include: Summarized above.    ASSESSMENT & PLAN:   1. Shortness of breath - I suspect  multifactorial due to anemia, emphysema, morbid obesity, deconditioning. I am quite concerned that she never followed up with anyone after her hemoglobin came back markedly decreased in 02/2017. She states she didn't know it was that big of a deal. She does not recall being told to go to urgent care, despite conversation outlined in result note. Recent PFTs showed emphysema prompting referral to pulmonology which is pending. Her inactivity and morbid obesity is certainly compounding the issue. Will check stat labs today including CBC, CMET, BNP and add anemia panel. She does not appear markedly volume overloaded although this is very difficult to assess given BMI in excess over 50. Initial BP was elevated by tech but recheck by me was normal. She requested handicap placard and I have signed the application today but also encouraged her to remain active long-term as much as possible once acute issues are sorted out. 2. Microcytic anemia - recheck today ASAP. Plan referral to GI  as outpatient if relatively stable. However, I told her if there is a clinically significant decline, we will need to ask her to go to the hospital for further evaluation as she does not have a primary care or a gastroenterologist here. She cites insurance issues (policy is in [place]) and says she cannot get established with PCP until next year. ADDENDUM 06/11/2017 2:27 PM - patient found to have Hgb of 5.4 and Hct of 19.1. She would not answer the phone and we could not get in touch with her emergency contacts thus [facility] contacted for welfare check. Patient returned phone call upon their arrival and informed of need for urgent eval in ER for critical anemia. 3. Persistent atrial fib - maintaining NSR on above regimen. Continue current regimen but needs repeat CBC today given progressive anemia in 02/2017. LFTs and TSH were normal in 02/2017. Recheck LFTs today as she says her daughter said she looked a little "yellow" the other  day. She has previously visited the AF clinic to discuss alternatives to amiodarone but the patient did not follow up to let AF clinic know her decision. She has been referred to pulm for abnormal PFTs. 4. Morbid obesity with suspected OSA - discussed this in depth. Discussed CV/arrhythmia risk as well as risk of dementia if OSA is left untreated. She is agreeable to revisiting scheduling of sleep study so nurse will assist with this. As she is unable to be very active (uses walker), we discussed concept of excess calories in relation to low metabolic activity. She is agreeable to meeting with a nutritionist to help assess these goals.  Disposition: F/u with Dr. Betsy Pries team APP in 4 weeks.   Medication Adjustments/Labs and Tests Ordered: Current medicines are reviewed at length with the patient today.  Concerns regarding medicines are outlined above. Medication changes, Labs and Tests ordered today are summarized above and listed in the Patient Instructions accessible in Encounters.   Signed, Charlie Pitter, PA-C  06/11/2017 10:48 AM    [facility] [place], [place], [place]  [address] Phone: [phone]; Fax: [phone]

## 2017-06-11 ENCOUNTER — Inpatient Hospital Stay (HOSPITAL_COMMUNITY)
Admission: EM | Admit: 2017-06-11 | Discharge: 2017-06-16 | DRG: 375 | Disposition: A | Discharge status: Home / Self Care | Payer: Medicare (Managed Care) | Attending: Family Medicine | Admitting: Family Medicine

## 2017-06-11 ENCOUNTER — Telehealth: Payer: Self-pay | Admitting: *Deleted

## 2017-06-11 ENCOUNTER — Encounter: Payer: Self-pay | Admitting: Physician Assistant

## 2017-06-11 ENCOUNTER — Ambulatory Visit (INDEPENDENT_AMBULATORY_CARE_PROVIDER_SITE_OTHER): Payer: Medicare (Managed Care) | Admitting: Physician Assistant

## 2017-06-11 ENCOUNTER — Emergency Department (HOSPITAL_COMMUNITY): Payer: Medicare (Managed Care)

## 2017-06-11 ENCOUNTER — Encounter (HOSPITAL_COMMUNITY): Payer: Self-pay | Admitting: *Deleted

## 2017-06-11 VITALS — BP 112/62 | HR 64 | Ht 66.0 in | Wt 322.0 lb

## 2017-06-11 DIAGNOSIS — K6389 Other specified diseases of intestine: Secondary | ICD-10-CM

## 2017-06-11 DIAGNOSIS — D649 Anemia, unspecified: Secondary | ICD-10-CM | POA: Diagnosis present

## 2017-06-11 DIAGNOSIS — N182 Chronic kidney disease, stage 2 (mild): Secondary | ICD-10-CM | POA: Diagnosis present

## 2017-06-11 DIAGNOSIS — G4733 Obstructive sleep apnea (adult) (pediatric): Secondary | ICD-10-CM | POA: Diagnosis present

## 2017-06-11 DIAGNOSIS — N179 Acute kidney failure, unspecified: Secondary | ICD-10-CM | POA: Diagnosis present

## 2017-06-11 DIAGNOSIS — Z888 Allergy status to other drugs, medicaments and biological substances status: Secondary | ICD-10-CM | POA: Diagnosis not present

## 2017-06-11 DIAGNOSIS — D509 Iron deficiency anemia, unspecified: Secondary | ICD-10-CM | POA: Diagnosis present

## 2017-06-11 DIAGNOSIS — D125 Benign neoplasm of sigmoid colon: Secondary | ICD-10-CM | POA: Diagnosis present

## 2017-06-11 DIAGNOSIS — R29818 Other symptoms and signs involving the nervous system: Secondary | ICD-10-CM

## 2017-06-11 DIAGNOSIS — R001 Bradycardia, unspecified: Secondary | ICD-10-CM | POA: Diagnosis not present

## 2017-06-11 DIAGNOSIS — Z79899 Other long term (current) drug therapy: Secondary | ICD-10-CM

## 2017-06-11 DIAGNOSIS — I5032 Chronic diastolic (congestive) heart failure: Secondary | ICD-10-CM | POA: Diagnosis present

## 2017-06-11 DIAGNOSIS — Z6841 Body Mass Index (BMI) 40.0 and over, adult: Secondary | ICD-10-CM | POA: Diagnosis not present

## 2017-06-11 DIAGNOSIS — K921 Melena: Secondary | ICD-10-CM | POA: Diagnosis not present

## 2017-06-11 DIAGNOSIS — K5730 Diverticulosis of large intestine without perforation or abscess without bleeding: Secondary | ICD-10-CM | POA: Diagnosis present

## 2017-06-11 DIAGNOSIS — D500 Iron deficiency anemia secondary to blood loss (chronic): Secondary | ICD-10-CM | POA: Diagnosis not present

## 2017-06-11 DIAGNOSIS — I481 Persistent atrial fibrillation: Secondary | ICD-10-CM | POA: Diagnosis present

## 2017-06-11 DIAGNOSIS — R0602 Shortness of breath: Secondary | ICD-10-CM

## 2017-06-11 DIAGNOSIS — K922 Gastrointestinal hemorrhage, unspecified: Secondary | ICD-10-CM | POA: Diagnosis not present

## 2017-06-11 DIAGNOSIS — I4892 Unspecified atrial flutter: Secondary | ICD-10-CM | POA: Diagnosis present

## 2017-06-11 DIAGNOSIS — C182 Malignant neoplasm of ascending colon: Principal | ICD-10-CM | POA: Diagnosis present

## 2017-06-11 DIAGNOSIS — K639 Disease of intestine, unspecified: Secondary | ICD-10-CM | POA: Diagnosis not present

## 2017-06-11 DIAGNOSIS — Z87891 Personal history of nicotine dependence: Secondary | ICD-10-CM | POA: Diagnosis not present

## 2017-06-11 DIAGNOSIS — Z7901 Long term (current) use of anticoagulants: Secondary | ICD-10-CM

## 2017-06-11 DIAGNOSIS — I130 Hypertensive heart and chronic kidney disease with heart failure and stage 1 through stage 4 chronic kidney disease, or unspecified chronic kidney disease: Secondary | ICD-10-CM | POA: Diagnosis present

## 2017-06-11 DIAGNOSIS — I4819 Other persistent atrial fibrillation: Secondary | ICD-10-CM

## 2017-06-11 LAB — BASIC METABOLIC PANEL
Anion gap: 13 (ref 5–15)
BUN: 28 mg/dL — ABNORMAL HIGH (ref 6–20)
CALCIUM: 8.7 mg/dL — AB (ref 8.9–10.3)
CHLORIDE: 101 mmol/L (ref 101–111)
CO2: 24 mmol/L (ref 22–32)
CREATININE: 1.48 mg/dL — AB (ref 0.44–1.00)
GFR calc non Af Amer: 35 mL/min — ABNORMAL LOW (ref >60)
GFR, EST AFRICAN AMERICAN: 41 mL/min — AB (ref >60)
Glucose, Bld: 152 mg/dL — ABNORMAL HIGH (ref 65–99)
Potassium: 4.2 mmol/L (ref 3.5–5.1)
SODIUM: 138 mmol/L (ref 135–145)

## 2017-06-11 LAB — I-STAT CHEM 8, ED
BUN: 27 mg/dL — ABNORMAL HIGH (ref 6–20)
CHLORIDE: 105 mmol/L (ref 101–111)
CREATININE: 1.5 mg/dL — AB (ref 0.44–1.00)
Calcium, Ion: 1.14 mmol/L — ABNORMAL LOW (ref 1.15–1.40)
GLUCOSE: 154 mg/dL — AB (ref 65–99)
HCT: 19 % — ABNORMAL LOW (ref 36.0–46.0)
Hemoglobin: 6.5 g/dL — CL (ref 12.0–15.0)
POTASSIUM: 4.3 mmol/L (ref 3.5–5.1)
Sodium: 141 mmol/L (ref 135–145)
TCO2: 25 mmol/L (ref 22–32)

## 2017-06-11 LAB — IRON AND TIBC
IRON SATURATION: 3 % — AB (ref 15–55)
Iron: 14 ug/dL — ABNORMAL LOW (ref 27–139)
TIBC: 447 ug/dL (ref 250–450)
UIBC: 433 ug/dL — ABNORMAL HIGH (ref 118–369)

## 2017-06-11 LAB — CBC
HCT: 20.7 % — ABNORMAL LOW (ref 36.0–46.0)
HEMOGLOBIN: 5.4 g/dL — AB (ref 11.1–15.9)
Hematocrit: 19.1 % — ABNORMAL LOW (ref 34.0–46.6)
Hemoglobin: 5.6 g/dL — CL (ref 12.0–15.0)
MCH: 17.5 pg — ABNORMAL LOW (ref 26.6–33.0)
MCH: 18.2 pg — ABNORMAL LOW (ref 26.0–34.0)
MCHC: 28.3 g/dL — AB (ref 31.5–35.7)
MCHC: 27.1 g/dL — ABNORMAL LOW (ref 30.0–36.0)
MCV: 62 fL — ABNORMAL LOW (ref 79–97)
MCV: 67.2 fL — ABNORMAL LOW (ref 78.0–100.0)
Platelets: 494 10*3/uL — ABNORMAL HIGH (ref 150–379)
Platelets: 451 10*3/uL — ABNORMAL HIGH (ref 150–400)
RBC: 3.08 x10E6/uL — ABNORMAL LOW (ref 3.77–5.28)
RBC: 3.08 MIL/uL — AB (ref 3.87–5.11)
RDW: 23 % — AB (ref 12.3–15.4)
RDW: 20.8 % — AB (ref 11.5–15.5)
WBC: 9 10*3/uL (ref 3.4–10.8)
WBC: 9.6 10*3/uL (ref 4.0–10.5)

## 2017-06-11 LAB — COMPREHENSIVE METABOLIC PANEL
A/G RATIO: 1.3 (ref 1.2–2.2)
ALBUMIN: 3.7 g/dL (ref 3.6–4.8)
ALT: 24 IU/L (ref 0–32)
AST: 16 IU/L (ref 0–40)
Alkaline Phosphatase: 88 IU/L (ref 39–117)
BUN / CREAT RATIO: 19 (ref 12–28)
BUN: 25 mg/dL (ref 8–27)
Bilirubin Total: 0.5 mg/dL (ref 0.0–1.2)
CALCIUM: 9.3 mg/dL (ref 8.7–10.3)
CO2: 24 mmol/L (ref 20–29)
Chloride: 106 mmol/L (ref 96–106)
Creatinine, Ser: 1.34 mg/dL — ABNORMAL HIGH (ref 0.57–1.00)
GFR, EST AFRICAN AMERICAN: 47 mL/min/{1.73_m2} — AB (ref >59)
GFR, EST NON AFRICAN AMERICAN: 41 mL/min/{1.73_m2} — AB (ref >59)
GLOBULIN, TOTAL: 2.9 g/dL (ref 1.5–4.5)
Glucose: 125 mg/dL — ABNORMAL HIGH (ref 65–99)
POTASSIUM: 4.5 mmol/L (ref 3.5–5.2)
Sodium: 140 mmol/L (ref 134–144)
TOTAL PROTEIN: 6.6 g/dL (ref 6.0–8.5)

## 2017-06-11 LAB — VITAMIN B12: Vitamin B-12: 494 pg/mL (ref 232–1245)

## 2017-06-11 LAB — PREPARE RBC (CROSSMATCH)

## 2017-06-11 LAB — HEPATIC FUNCTION PANEL
ALBUMIN: 3.5 g/dL (ref 3.5–5.0)
ALT: 28 U/L (ref 14–54)
AST: 23 U/L (ref 15–41)
Alkaline Phosphatase: 75 U/L (ref 38–126)
Total Bilirubin: 1 mg/dL (ref 0.3–1.2)
Total Protein: 7 g/dL (ref 6.5–8.1)

## 2017-06-11 LAB — ABO/RH: ABO/RH(D): O NEG

## 2017-06-11 LAB — FOLATE: Folate: 11.8 ng/mL (ref >3.0)

## 2017-06-11 LAB — PRO B NATRIURETIC PEPTIDE: NT-PRO BNP: 574 pg/mL — AB (ref 0–301)

## 2017-06-11 LAB — PROTIME-INR
INR: 2.13
Prothrombin Time: 23.6 seconds — ABNORMAL HIGH (ref 11.4–15.2)

## 2017-06-11 LAB — APTT: APTT: 39 s — AB (ref 24–36)

## 2017-06-11 LAB — FERRITIN: FERRITIN: 7 ng/mL — AB (ref 15–150)

## 2017-06-11 LAB — POC OCCULT BLOOD, ED: Fecal Occult Bld: POSITIVE — AB

## 2017-06-11 MED ORDER — IOPAMIDOL (ISOVUE-300) INJECTION 61%
100.0000 mL | Freq: Once | INTRAVENOUS | Status: AC | PRN
  Administered 2017-06-11: 100 mL via INTRAVENOUS

## 2017-06-11 MED ORDER — METOPROLOL TARTRATE 5 MG/5ML IV SOLN
2.5000 mg | Freq: Four times a day (QID) | INTRAVENOUS | Status: DC
  Administered 2017-06-11: 2.5 mg via INTRAVENOUS
  Filled 2017-06-11: qty 5

## 2017-06-11 MED ORDER — IOPAMIDOL (ISOVUE-300) INJECTION 61%
INTRAVENOUS | Status: AC
  Filled 2017-06-11: qty 100

## 2017-06-11 MED ORDER — ACETAMINOPHEN 650 MG RE SUPP: 650.0000 mg | Freq: Four times a day (QID) | RECTAL | Status: DC | PRN

## 2017-06-11 MED ORDER — PANTOPRAZOLE SODIUM 40 MG IV SOLR
40.0000 mg | Freq: Two times a day (BID) | INTRAVENOUS | Status: DC
  Administered 2017-06-11 – 2017-06-16 (×10): 40 mg via INTRAVENOUS
  Filled 2017-06-11 (×10): qty 40

## 2017-06-11 MED ORDER — SODIUM CHLORIDE 0.9 % IV SOLN
INTRAVENOUS | Status: DC
  Administered 2017-06-11 – 2017-06-13 (×3): via INTRAVENOUS

## 2017-06-11 MED ORDER — ACETAMINOPHEN 325 MG PO TABS: 650.0000 mg | ORAL_TABLET | Freq: Four times a day (QID) | ORAL | Status: DC | PRN

## 2017-06-11 NOTE — Patient Instructions (Addendum)
Medication Instructions:  Your physician recommends that you continue on your current medications as directed. Please refer to the Current Medication list given to you today.  Labwork: STAT:  CMET, CBC, PRO BNP, & ANEMIA PANEL  Testing/Procedures: Your physician has recommended that you have a sleep study. This test records several body functions during sleep, including: brain activity, eye movement, oxygen and carbon dioxide blood levels, heart rate and rhythm, breathing rate and rhythm, the flow of air through your mouth and nose, snoring, body muscle movements, and chest and belly movement.    Follow-Up: Your physician recommends that you schedule a follow-up appointment in: [place], PA-C ON A DAY DR. Radford Pax IS IN THE OFFICE  You have been referred to NUTRITION You have been referred to GASTROENTEROLOGY You have been referred to PRIMARY CARE:  YOU CAN .  YOU HAVE BEEN GIVEN A NUMBER TO CALL AND GET THIS SET UP.   Any Other Special Instructions Will Be Listed Below (If Applicable). Sleep Studies A sleep study (polysomnogram) is a series of tests done while you are sleeping. It can show how well you sleep. This can help your health care provider diagnose a sleep disorder and show how severe your sleep disorder is. A sleep study may lead to treatment that will help you sleep better and prevent other medical problems caused by poor sleep. If you have a sleep disorder, you may also be at risk for:  Sleep-related accidents.  High blood pressure.  Heart disease.  Stroke.  Other medical conditions.  Sleep disorders are common. Your health care provider may suspect a sleep disorder if you:  Have loud snoring most nights.  Have brief periods when you stop breathing at night.  Feel sleepy on most days.  Fall asleep suddenly during the day.  Have trouble falling asleep or staying asleep.  Feel like you need to move your legs when trying to fall  asleep.  Have dreams that seem very real shortly after falling asleep.  Feel like you cannot move when you first wake up.  Which tests will I need to have? Most sleep studies last all night and include these tests:  Recordings of your brain activity.  Recordings of your eye movements.  Recording of your heart rate and rhythm.  Blood pressure readings.  Readings of the amount of oxygen in your blood.  Measurements of your chest and belly movement as you breathe during sleep.  If you have signs of the sleep disorder called sleep apnea during your test, you may get a mask to wear for the second half of the night.  The mask provides continuous positive airway pressure (CPAP). This may improve sleep apnea significantly.  You will then have all tests done again with the mask in place to see if your measurements and recordings change.  How are sleep studies done? Most sleep studies are done over one full night of sleep.  You will arrive at the study center in the evening and can go home in the morning.  Bring your pajamas and toothbrush.  Do not have caffeine on the day of your sleep study.  Your health care provider will let you know if you need to stop taking any of your regular medicines before the test.  To do the tests included in a polysomnogram, you will have:  Round, sticky patches with sensors attached to recording wires (electrodes) placed on your scalp, face, chest, and limbs.  Wires from  all the electrodes and sensors run from your bed to a computer. The wires can be taken off and put back on if you need to get out of bed to go to the bathroom.  A sensor placed over your nose to measure airflow.  A finger clip put on one finger to measure your blood oxygen level.  A belt around your belly and a belt around your chest to measure breathing movements.  Where are sleep studies done? Sleep studies are done at sleep centers. A sleep center may be inside a hospital,  office, or clinic. The room where you have the study may look like a hospital room or a hotel room. The health care providers doing the study may come in and out of the room during the study. Most of the time, they will be in another room monitoring your test. How is information from sleep studies helpful? A polysomnogram can be used along with your medical history and a physical exam to diagnose conditions, such as:  Sleep apnea.  Restless legs syndrome.  Sleep-related seizure disorders.  Sleep-related movement disorders.  A medical doctor who specializes in sleep will evaluate your sleep study. The specialist will share the results with your primary health care provider. Treatments based on your sleep study may include:  Improving your sleep habits (sleep hygiene).  Wearing a CPAP mask.  Wearing an oral device at night to improve breathing and reduce snoring.  Taking medicine for: ? Restless legs syndrome. ? Sleep-related seizure disorder. ? Sleep-related movement disorder.  This information is not intended to replace advice given to you by your health care provider. Make sure you discuss any questions you have with your health care provider. Document Released: 03/29/2003 Document Revised: 05/18/2016 Document Reviewed: 11/28/2013 Elsevier Interactive Patient Education  [employer].     If you need a refill on your cardiac medications before your next appointment, please call your pharmacy.

## 2017-06-11 NOTE — Telephone Encounter (Signed)
Informed patient of upcoming sleep study and patient understanding was verbalized. Patient understands her sleep study is scheduled for Wednesday August 05 2017. Patient understands her sleep study will be done at [facility] sleep lab. Patient understands she will receive a sleep packet in a week or so. Patient understands to call if she does not receive the sleep packet in a timely manner. Patient agrees with treatment and thanked me for call.

## 2017-06-11 NOTE — Telephone Encounter (Signed)
-----   Message from Jeanann Lewandowsky, [place] sent at 06/11/2017 11:24 AM EDT ----- PT NEEDS SLEEP STUDY SCHEDULED

## 2017-06-11 NOTE — ED Triage Notes (Signed)
Per EMS, pt from home complains of weakness for several days. Pt's hemoglobin was low at doctor's office today, EMS unsure how low. Pt denies chest pain, n/v. Pt states she has SOB with exertion. Pt states she has never had a blood transfusion. Pt is on Xarelto for atrial fibrillation.   BP 113/84 O2 98 HR 70 RR 18 CBG 179

## 2017-06-11 NOTE — ED Provider Notes (Signed)
[facility] DEPT Provider Note   CSN: [id number] Arrival date & time: 06/11/17  1447     History   Chief Complaint Chief Complaint  Patient presents with  . Abnormal Lab  . Shortness of Breath    HPI   Blood pressure (!) 129/35, pulse 75, temperature 98.6 F (37 C), temperature source Oral, resp. rate 17, height 5\' 6"  (1.676 m), weight (!) 145.2 kg (320 lb), SpO2 97 %.  Amber Medina is a 66 y.o. female with past medical history significant atrial fibrillation (anticoagulated with xarelto) sent from cardiology fora worsening shortness of breath and dyspnea on exertion over the course of the last several months significantly worsening over the last 48 hours. She denies any chest pain, orthopnea, PND, increasing peripheral edema, melena, hematochezia, diarrhea or large volume stool. She states that her stool is auburn colored and she is normal for her. She does not take any NSAIDs. She's never been told that she has liver dysfunction. She does not drink alcohol regularly. She has never seen a gastroenterologist and has never had a colonoscopy.patient denies history of CKD however, chart says she is stage II. Does not have PCP. Patient is postmenopausal.  Past Medical History:  Diagnosis Date  . Abnormal pulmonary function test 02/2017  . Chronic diastolic (congestive) heart failure ([place]) 10/11/2015  . CKD (chronic kidney disease), stage II   . Microcytic anemia    by labs 02/2017  . Morbid obesity ([place])   . Nocturnal hypoxemia   . Persistent atrial fibrillation ([place])   . Snoring   . Suspected sleep apnea     Patient Active Problem List   Diagnosis Date Noted  . Anemia 06/11/2017  . Chronic diastolic (congestive) heart failure ([place]) 10/11/2015  . Persistent atrial fibrillation ([place]) 10/11/2015  . OSA (obstructive sleep apnea)   . Acute respiratory failure with hypoxia ([place])   . Hyperglycemia 09/06/2015  . Anasarca 09/06/2015  . Obesity, morbid ([place]) 09/06/2015    Past  Surgical History:  Procedure Laterality Date  . TONSILLECTOMY      OB History    No data available       Home Medications    Prior to Admission medications   Medication Sig Start Date End Date Taking? Authorizing Provider  acetaminophen (TYLENOL) 500 MG tablet Take 1,000 mg by mouth every 8 (eight) hours as needed for mild pain or moderate pain.   Yes [provider]  amiodarone (PACERONE) 200 MG tablet Take 1 tablet (200 mg total) by mouth daily. 03/27/17  Yes Turner, Eber Hong, MD  diltiazem (CARDIZEM CD) 240 MG 24 hr capsule Take 1 capsule (240 mg total) by mouth daily. 02/17/17  Yes Turner, Eber Hong, MD  furosemide (LASIX) 40 MG tablet Take 1 tablet (40 mg total) by mouth daily. 02/19/17 02/14/18 Yes Turner, Eber Hong, MD  metoprolol succinate (TOPROL-XL) 25 MG 24 hr tablet take 1 tablet by mouth once daily 09/16/16  Yes Turner, Traci R, MD  potassium chloride SA (K-DUR,KLOR-CON) 20 MEQ tablet Take 1 tablet (20 mEq total) by mouth 2 (two) times daily. 05/07/17  Yes [name], MD  XARELTO 20 MG TABS tablet take 1 tablet by mouth once daily with SUPPER 12/29/16  Yes Turner, Eber Hong, MD    Family History Family History  Problem Relation Age of Onset  . Diabetes Mellitus II Mother   . Hypertension Mother   . Congenital heart disease Mother   . Hypertension Father     Social History  Social History  Substance Use Topics  . Smoking status: Former Research scientist (life sciences)  . Smokeless tobacco: Never Used  . Alcohol use Yes     Allergies   Compazine [prochlorperazine edisylate]   Review of Systems Review of Systems  A complete review of systems was obtained and all systems are negative except as noted in the HPI and PMH.    Physical Exam Updated Vital Signs BP (!) 105/55   Pulse 64   Temp 98.6 F (37 C) (Oral)   Resp 20   Ht 5\' 6"  (1.676 m)   Wt (!) 145.2 kg (320 lb)   LMP  (LMP Unknown)   SpO2 99%   BMI 51.65 kg/m   Physical Exam  Constitutional: She is oriented to  person, place, and time. She appears well-developed and well-nourished. No distress.  Obese  HENT:  Head: Normocephalic and atraumatic.  Mouth/Throat: Oropharynx is clear and moist.  Significant conjunctival pallor  Eyes: Pupils are equal, round, and reactive to light. Conjunctivae and EOM are normal.  Neck: Normal range of motion.  Cardiovascular: Normal rate, regular rhythm and intact distal pulses.   Pulmonary/Chest: Effort normal and breath sounds normal. No respiratory distress. She has no wheezes. She has no rales. She exhibits no tenderness.  Abdominal: Soft. There is no tenderness.  Genitourinary:  Genitourinary Comments: Digital rectal exam a chaperoned by technician; No rashes or Lesions, normal rectal tone, stool color reddish  Musculoskeletal: Normal range of motion.  Neurological: She is alert and oriented to person, place, and time.  Skin: She is not diaphoretic.  Psychiatric: She has a normal mood and affect.  Nursing note and vitals reviewed.    ED Treatments / Results  Labs (all labs ordered are listed, but only abnormal results are displayed) Labs Reviewed  BASIC METABOLIC PANEL - Abnormal; Notable for the following:       Result Value   Glucose, Bld 152 (*)    BUN 28 (*)    Creatinine, Ser 1.48 (*)    Calcium 8.7 (*)    GFR calc non Af Amer 35 (*)    GFR calc Af Amer 41 (*)    All other components within normal limits  CBC - Abnormal; Notable for the following:    RBC 3.08 (*)    Hemoglobin 5.6 (*)    HCT 20.7 (*)    MCV 67.2 (*)    MCH 18.2 (*)    MCHC 27.1 (*)    RDW 20.8 (*)    Platelets 451 (*)    All other components within normal limits  PROTIME-INR - Abnormal; Notable for the following:    Prothrombin Time 23.6 (*)    All other components within normal limits  APTT - Abnormal; Notable for the following:    aPTT 39 (*)    All other components within normal limits  HEPATIC FUNCTION PANEL - Abnormal; Notable for the following:    Bilirubin,  Direct <0.1 (*)    All other components within normal limits  POC OCCULT BLOOD, ED - Abnormal; Notable for the following:    Fecal Occult Bld POSITIVE (*)    All other components within normal limits  I-STAT CHEM 8, ED - Abnormal; Notable for the following:    BUN 27 (*)    Creatinine, Ser 1.50 (*)    Glucose, Bld 154 (*)    Calcium, Ion 1.14 (*)    Hemoglobin 6.5 (*)    HCT 19.0 (*)    All other components  within normal limits  TYPE AND SCREEN  PREPARE RBC (CROSSMATCH)  ABO/RH    EKG  EKG Interpretation None       Radiology Ct Abdomen Pelvis W Contrast  Result Date: 06/11/2017 CLINICAL DATA:  Weakness, iron deficiency anemia EXAM: CT ABDOMEN AND PELVIS WITH CONTRAST TECHNIQUE: Multidetector CT imaging of the abdomen and pelvis was performed using the standard protocol following bolus administration of intravenous contrast. CONTRAST:  155mL ISOVUE-300 IOPAMIDOL (ISOVUE-300) INJECTION 61% COMPARISON:  None. FINDINGS: Lower chest: Lung bases demonstrate no acute consolidation or pleural effusion. Heart size within normal limits. Hepatobiliary: Calcified gallstones. No biliary dilatation or focal hepatic abnormality Pancreas: Unremarkable. No pancreatic ductal dilatation or surrounding inflammatory changes. Spleen: Normal in size without focal abnormality. Adrenals/Urinary Tract: Stable nodular thickening of the adrenal glands. Subcentimeter hypodense lesions in the kidneys too small to further characterize. Negative for hydronephrosis. The bladder is unremarkable. Stomach/Bowel: Stomach is within normal limits. Appendix appears normal. No evidence of bowel wall thickening, distention, or inflammatory changes. Mild diverticular disease of the colon without acute inflammation Vascular/Lymphatic: Aortic atherosclerosis. No enlarged abdominal or pelvic lymph nodes. Reproductive: No adnexal mass. Possible small right fundal exophytic fibroid. Prominent endometrial stripe for age, measuring up to  9 mm on sagittal views. Prominent hypodensity at the cervix. Other: Negative for free air or free fluid. Musculoskeletal: Degenerative changes of the spine. No acute or suspicious bone lesion IMPRESSION: 1. No CT evidence for acute intra-abdominal or pelvic pathology 2. Gallstones 3. Sigmoid colon diverticular disease without acute inflammation 4. Suspected endometrial thickening, this may be correlated with a pelvic ultrasound. Electronically Signed   By: Donavan Foil M.D.   On: 06/11/2017 16:42    Procedures Procedures (including critical care time)  Medications Ordered in ED Medications  0.9 %  sodium chloride infusion (not administered)  iopamidol (ISOVUE-300) 61 % injection (not administered)  iopamidol (ISOVUE-300) 61 % injection 100 mL (100 mLs Intravenous Contrast Given 06/11/17 1618)     Initial Impression / Assessment and Plan / ED Course  I have reviewed the triage vital signs and the nursing notes.  Pertinent labs & imaging results that were available during my care of the patient were reviewed by me and considered in my medical decision making (see chart for details).     Vitals:   06/11/17 1511 06/11/17 1815  BP: (!) 129/35 (!) 105/55  Pulse: 75 64  Resp: 17 20  Temp: 98.6 F (37 C)   TempSrc: Oral   SpO2: 97% 99%  Weight: (!) 145.2 kg (320 lb)   Height: 5\' 6"  (1.676 m)     Medications  0.9 %  sodium chloride infusion (not administered)  iopamidol (ISOVUE-300) 61 % injection (not administered)  iopamidol (ISOVUE-300) 61 % injection 100 mL (100 mLs Intravenous Contrast Given 06/11/17 1618)    Amber Medina is 66 y.o. female presenting with shortness of breath, dyspnea on exertion over the last several months initially worsening over the last several days. She is anticoagulated with Xarelto. She has dropped 5 points in her hemoglobin in the last 3 months to 5. She has no religious objections to transfusion. Fecal occult blood is positive, likely slow diverticular bleed  given the reddish normally formed stool. EKG with A-flutter.   Patient gently hydrated with 75 mL per hour of saline. Made nothing by mouth. CT to evaluate for diverticulosis.  Hemoglobin 5.6, transfused one unit. Guaiac positive.CT with sigmoid diverticulosis. Transfused one unit in ED.   D/w triad hospitalist Dr. Darrick Meigs  who accepts admission.   Delay in receiving a return call from gastroenterology, will re-paged.  GI consult from Dr. Silverio Decamp appreciated: They will evaluate the patient non-emergently.   Final Clinical Impressions(s) / ED Diagnoses   Final diagnoses:  Lower GI bleed  Anemia, unspecified type  Chronic anticoagulation  Diverticulosis of colon    New Prescriptions New Prescriptions   No medications on file     Waynetta Pean 06/11/17 Sharene Skeans, MD 06/12/17 1630

## 2017-06-11 NOTE — Telephone Encounter (Signed)
Critical HGB Level was 5.4. Waiting on lab to come up in [facility] from the lab.

## 2017-06-11 NOTE — ED Notes (Signed)
IV attempt X2, no sucess

## 2017-06-11 NOTE — Telephone Encounter (Signed)
Pt returned my call and she has been made aware of her critical labs and that ems is on the way to pick her up. While we was talking, EMS showed up and pt was transported to ED.

## 2017-06-11 NOTE — ED Notes (Signed)
Bed: WA17 Expected date:  Expected time:  Means of arrival:  Comments: 66 yo f weakness

## 2017-06-11 NOTE — ED Notes (Signed)
Mary accepting RN [phone] Report scheduled for 1820

## 2017-06-11 NOTE — H&P (Signed)
TRH H&P    Patient Demographics:    Amber Medina, is a 66 y.o. female  MRN: [id number]  DOB - [birth year]  Admit Date - 06/11/2017  Referring MD/NP/PA: Dr. Tomi Bamberger  Outpatient Primary MD for the patient is Patient, No Pcp Per  Patient coming from: Cardiologist office  Chief Complaint  Patient presents with  . Abnormal Lab  . Shortness of Breath      HPI:    Amber Medina  is a 66 y.o. female, With history of atrial fibrillation on anticoagulation with Xarelto, chronic diastolic CHF, hypertension, chronic kidney disease stage II, morbid obesity who was sent from cardiology office for further evaluation of anemia. According to patient she has been having worsening shortness of breath over past several months since May. And she has gradually worsened. Today she felt worse and was not able to walk more than 10 feet without having to rest. She went to her cardiologist office. Lab work revealed severe anemia with hemoglobin of 5.4. Patient was sent to ED for further evaluation.  She denies black-colored stool, no nausea vomiting or diarrhea. No blood in the stool noticed by the patient. No vomiting of blood. She denies chest pain, no fever or dysuria. Abdominal pain. Complains of dyspnea on exertion. No dizziness or passing out.  In the ED repeat hemoglobin was 6.5, CT abdomen pelvis showed sigmoid colon diverticular disease without acute inflammation. 1 unit PRBC has been ordered.    Review of systems:      All other systems reviewed and are negative.   With Past History of the following :    Past Medical History:  Diagnosis Date  . Abnormal pulmonary function test 02/2017  . Chronic diastolic (congestive) heart failure ([place]) 10/11/2015  . CKD (chronic kidney disease), stage II   . Microcytic anemia    by labs 02/2017  . Morbid obesity ([place])   . Nocturnal hypoxemia   . Persistent atrial fibrillation ([place])   .  Snoring   . Suspected sleep apnea       Past Surgical History:  Procedure Laterality Date  . TONSILLECTOMY        Social History:      Social History  Substance Use Topics  . Smoking status: Former Research scientist (life sciences)  . Smokeless tobacco: Never Used  . Alcohol use Yes       Family History :     Family History  Problem Relation Age of Onset  . Diabetes Mellitus II Mother   . Hypertension Mother   . Congenital heart disease Mother   . Hypertension Father       Home Medications:   Prior to Admission medications   Medication Sig Start Date End Date Taking? Authorizing Provider  acetaminophen (TYLENOL) 500 MG tablet Take 1,000 mg by mouth every 8 (eight) hours as needed for mild pain or moderate pain.   Yes [provider]  amiodarone (PACERONE) 200 MG tablet Take 1 tablet (200 mg total) by mouth daily. 03/27/17  Yes [name], MD  diltiazem (CARDIZEM CD) 240  MG 24 hr capsule Take 1 capsule (240 mg total) by mouth daily. 02/17/17  Yes Turner, Eber Hong, MD  furosemide (LASIX) 40 MG tablet Take 1 tablet (40 mg total) by mouth daily. 02/19/17 02/14/18 Yes Turner, Eber Hong, MD  metoprolol succinate (TOPROL-XL) 25 MG 24 hr tablet take 1 tablet by mouth once daily 09/16/16  Yes Turner, Traci R, MD  potassium chloride SA (K-DUR,KLOR-CON) 20 MEQ tablet Take 1 tablet (20 mEq total) by mouth 2 (two) times daily. 05/07/17  Yes [name], MD  XARELTO 20 MG TABS tablet take 1 tablet by mouth once daily with SUPPER 12/29/16  Yes Turner, Eber Hong, MD     Allergies:     Allergies  Allergen Reactions  . Compazine [Prochlorperazine Edisylate] Swelling     Physical Exam:   Vitals  Blood pressure (!) 129/35, pulse 75, temperature 98.6 F (37 C), temperature source Oral, resp. rate 17, height 5\' 6"  (1.676 m), weight (!) 145.2 kg (320 lb), SpO2 97 %.  1.  General: Appears in no acute distress  2. Psychiatric:  Intact judgement and  insight, awake alert, oriented x 3.  3.  Neurologic: No focal neurological deficits, all cranial nerves intact.Strength 5/5 all 4 extremities, sensation intact all 4 extremities, plantars down going.  4. Eyes :  anicteric sclerae, moist conjunctivae with no lid lag. PERRLA.  5. ENMT:  Oropharynx clear with moist mucous membranes and good dentition  6. Neck:  supple, no cervical lymphadenopathy appriciated, No thyromegaly  7. Respiratory : Normal respiratory effort, good air movement bilaterally,clear to  auscultation bilaterally  8. Cardiovascular : RRR, no gallops, rubs or murmurs, no leg edema  9. Gastrointestinal:  Positive bowel sounds, abdomen soft, non-tender to palpation,no hepatosplenomegaly, no rigidity or guarding       10. Skin:  No cyanosis, normal texture and turgor, no rash, lesions or ulcers  11.Musculoskeletal:  Good muscle tone,  joints appear normal , no effusions,  normal range of motion    Data Review:    CBC  Recent Labs Lab 06/11/17 1159 06/11/17 1539 06/11/17 1554  WBC 9.0 9.6  --   HGB 5.4* 5.6* 6.5*  HCT 19.1* 20.7* 19.0*  PLT 494* 451*  --   MCV 62* 67.2*  --   MCH 17.5* 18.2*  --   MCHC 28.3* 27.1*  --   RDW 23.0* 20.8*  --    ------------------------------------------------------------------------------------------------------------------  Chemistries   Recent Labs Lab 06/11/17 1159 06/11/17 1539 06/11/17 1554  NA 140 138 141  K 4.5 4.2 4.3  CL 106 101 105  CO2 24 24  --   GLUCOSE 125* 152* 154*  BUN 25 28* 27*  CREATININE 1.34* 1.48* 1.50*  CALCIUM 9.3 8.7*  --   AST 16 23  --   ALT 24 28  --   ALKPHOS 88 75  --   BILITOT 0.5 1.0  --    ------------------------------------------------------------------------------------------------------------------  ------------------------------------------------------------------------------------------------------------------ GFR: Estimated Creatinine Clearance: 53.1 mL/min (A) (by C-G formula based on SCr of 1.5 mg/dL  (H)). Liver Function Tests:  Recent Labs Lab 06/11/17 1159 06/11/17 1539  AST 16 23  ALT 24 28  ALKPHOS 88 75  BILITOT 0.5 1.0  PROT 6.6 7.0  ALBUMIN 3.7 3.5   No results for input(s): LIPASE, AMYLASE in the last 168 hours. No results for input(s): AMMONIA in the last 168 hours. Coagulation Profile:  Recent Labs Lab 06/11/17 1539  INR 2.13   Cardiac Enzymes: No results for  input(s): CKTOTAL, CKMB, CKMBINDEX, TROPONINI in the last 168 hours. BNP (last 3 results)  Recent Labs  06/11/17 1200  PROBNP 574*    Anemia Panel:  Recent Labs  06/11/17 1200  VITAMINB12 494  FOLATE 11.8  FERRITIN 7*  TIBC 447  IRON 14*    --------------------------------------------------------------------------------------------------------------- Urine analysis: No results found for: COLORURINE, APPEARANCEUR, LABSPEC, PHURINE, GLUCOSEU, HGBUR, BILIRUBINUR, KETONESUR, PROTEINUR, UROBILINOGEN, NITRITE, LEUKOCYTESUR    Imaging Results:    Ct Abdomen Pelvis W Contrast  Result Date: 06/11/2017 CLINICAL DATA:  Weakness, iron deficiency anemia EXAM: CT ABDOMEN AND PELVIS WITH CONTRAST TECHNIQUE: Multidetector CT imaging of the abdomen and pelvis was performed using the standard protocol following bolus administration of intravenous contrast. CONTRAST:  174mL ISOVUE-300 IOPAMIDOL (ISOVUE-300) INJECTION 61% COMPARISON:  None. FINDINGS: Lower chest: Lung bases demonstrate no acute consolidation or pleural effusion. Heart size within normal limits. Hepatobiliary: Calcified gallstones. No biliary dilatation or focal hepatic abnormality Pancreas: Unremarkable. No pancreatic ductal dilatation or surrounding inflammatory changes. Spleen: Normal in size without focal abnormality. Adrenals/Urinary Tract: Stable nodular thickening of the adrenal glands. Subcentimeter hypodense lesions in the kidneys too small to further characterize. Negative for hydronephrosis. The bladder is unremarkable. Stomach/Bowel:  Stomach is within normal limits. Appendix appears normal. No evidence of bowel wall thickening, distention, or inflammatory changes. Mild diverticular disease of the colon without acute inflammation Vascular/Lymphatic: Aortic atherosclerosis. No enlarged abdominal or pelvic lymph nodes. Reproductive: No adnexal mass. Possible small right fundal exophytic fibroid. Prominent endometrial stripe for age, measuring up to 9 mm on sagittal views. Prominent hypodensity at the cervix. Other: Negative for free air or free fluid. Musculoskeletal: Degenerative changes of the spine. No acute or suspicious bone lesion IMPRESSION: 1. No CT evidence for acute intra-abdominal or pelvic pathology 2. Gallstones 3. Sigmoid colon diverticular disease without acute inflammation 4. Suspected endometrial thickening, this may be correlated with a pelvic ultrasound. Electronically Signed   By: Donavan Foil M.D.   On: 06/11/2017 16:42    My personal review of EKG: Rhythm atrial flutter with 3:1 block   Assessment & Plan:    Active Problems:   Anemia   1. Anemia-likely from lower GI diverticular bleed. 1 unit PRBC has been ordered in the ED. We will order another unit of PRBC on floor. Will check CBC in a.m. 2. Guaiac-positive stool- patient's stool was guaiac-positive, GI has been consulted by ED physician. Keep patient nothing by mouth. We'll start Protonix 40 mg IV every 12 hours. Will hold Xarelto at this time. 3. Atrial fibrillation- heart rate is controlled, EKG today shows atrial flutter with 3 :1 AV block. Will hold Cardizem, amiodarone and metoprolol. Start metoprolol 2.5 mg IV every 6 hours. 4. Hypertension- blood pressure is stable, start metoprolol as above.    DVT Prophylaxis-   SCD  AM Labs Ordered, also please review Full Orders  Family Communication: Admission, patients condition and plan of care including tests being ordered have been discussed with the patient and * who indicate understanding and agree  with the plan and Code Status.  Code Status:  DO NOT RESUSCITATE  Admission status: Inpatient    Time spent in minutes : 60 minutes   Amber Medina S M.D on 06/11/2017 at 5:59 PM  Between 7am to 7pm - Pager - [phone]. After 7pm go to [email or web address] - password [facility]  [facility] - Office  [phone]

## 2017-06-11 NOTE — ED Provider Notes (Signed)
Pt presents to the ED for evaluation of low hemoglobin.  Outpatient labs notable for hgb in the 5s.  Guiac positive.  Hemodynamically stable in the ED. Plan on admission for blood transfusions, further workup.  Medical screening examination/treatment/procedure(s) were conducted as a shared visit with non-physician practitioner(s) and myself.  I personally evaluated the patient during the encounter.      Dorie Rank, MD 06/11/17 [phone]

## 2017-06-11 NOTE — Telephone Encounter (Signed)
Tried several times to reach out to the pt re: critical lab values and pt needed to report to the E.D but pt never answered the phone.  I did leave a message, but after several attempts and critical labs, Melina Copa, PA-C contacted [facility] to do a safety check on pt and to transport to the ED.  They will call us to report when they reach the pt.

## 2017-06-11 NOTE — Addendum Note (Signed)
Addended by: Rodman Key on: 06/11/2017 11:58 AM   Modules accepted: Orders

## 2017-06-12 DIAGNOSIS — K921 Melena: Secondary | ICD-10-CM

## 2017-06-12 LAB — CBC
HCT: 20.6 % — ABNORMAL LOW (ref 36.0–46.0)
HEMOGLOBIN: 5.7 g/dL — AB (ref 12.0–15.0)
MCH: 18.8 pg — ABNORMAL LOW (ref 26.0–34.0)
MCHC: 27.7 g/dL — AB (ref 30.0–36.0)
MCV: 68 fL — ABNORMAL LOW (ref 78.0–100.0)
Platelets: 337 10*3/uL (ref 150–400)
RBC: 3.03 MIL/uL — ABNORMAL LOW (ref 3.87–5.11)
RDW: 20.9 % — ABNORMAL HIGH (ref 11.5–15.5)
WBC: 8.5 10*3/uL (ref 4.0–10.5)

## 2017-06-12 LAB — COMPREHENSIVE METABOLIC PANEL
ALBUMIN: 3 g/dL — AB (ref 3.5–5.0)
ALK PHOS: 64 U/L (ref 38–126)
ALT: 22 U/L (ref 14–54)
ANION GAP: 8 (ref 5–15)
AST: 16 U/L (ref 15–41)
BUN: 25 mg/dL — ABNORMAL HIGH (ref 6–20)
CO2: 26 mmol/L (ref 22–32)
Calcium: 8.6 mg/dL — ABNORMAL LOW (ref 8.9–10.3)
Chloride: 109 mmol/L (ref 101–111)
Creatinine, Ser: 1.19 mg/dL — ABNORMAL HIGH (ref 0.44–1.00)
GFR calc Af Amer: 53 mL/min — ABNORMAL LOW (ref >60)
GFR calc non Af Amer: 46 mL/min — ABNORMAL LOW (ref >60)
GLUCOSE: 110 mg/dL — AB (ref 65–99)
POTASSIUM: 3.9 mmol/L (ref 3.5–5.1)
SODIUM: 143 mmol/L (ref 135–145)
Total Bilirubin: 1.2 mg/dL (ref 0.3–1.2)
Total Protein: 6.2 g/dL — ABNORMAL LOW (ref 6.5–8.1)

## 2017-06-12 LAB — HEMOGLOBIN AND HEMATOCRIT, BLOOD
HEMATOCRIT: 30.6 % — AB (ref 36.0–46.0)
HEMOGLOBIN: 8.7 g/dL — AB (ref 12.0–15.0)

## 2017-06-12 LAB — PREPARE RBC (CROSSMATCH)

## 2017-06-12 MED ORDER — PEG-KCL-NACL-NASULF-NA ASC-C 100 G PO SOLR: 1.0000 | Freq: Once | ORAL | Status: DC

## 2017-06-12 MED ORDER — PEG-KCL-NACL-NASULF-NA ASC-C 100 G PO SOLR
0.5000 | Freq: Once | ORAL | Status: AC
  Administered 2017-06-12: 100 g via ORAL

## 2017-06-12 MED ORDER — METOPROLOL TARTRATE 5 MG/5ML IV SOLN
2.5000 mg | Freq: Two times a day (BID) | INTRAVENOUS | Status: DC
  Administered 2017-06-12: 2.5 mg via INTRAVENOUS
  Filled 2017-06-12: qty 5

## 2017-06-12 MED ORDER — FUROSEMIDE 10 MG/ML IJ SOLN
20.0000 mg | Freq: Once | INTRAMUSCULAR | Status: AC
  Administered 2017-06-12: 20 mg via INTRAVENOUS
  Filled 2017-06-12: qty 2

## 2017-06-12 MED ORDER — PEG-KCL-NACL-NASULF-NA ASC-C 100 G PO SOLR
0.5000 | Freq: Once | ORAL | Status: AC
  Administered 2017-06-12: 100 g via ORAL
  Filled 2017-06-12: qty 1

## 2017-06-12 MED ORDER — SODIUM CHLORIDE 0.9 % IV SOLN
Freq: Once | INTRAVENOUS | Status: AC
  Administered 2017-06-12: 11:00:00 via INTRAVENOUS

## 2017-06-12 NOTE — Plan of Care (Signed)
Problem: Education: Goal: Knowledge of [facility] Education information/materials will improve Outcome: Completed/Met Date Met: 06/12/17 .  Problem: Safety: Goal: Ability to remain free from injury will improve Outcome: Progressing .  Problem: Health Behavior/Discharge Planning: Goal: Ability to manage health-related needs will improve Outcome: Progressing .  Problem: Physical Regulation: Goal: Ability to maintain clinical measurements within normal limits will improve Outcome: Progressing . Goal: Will remain free from infection Outcome: Completed/Met Date Met: 06/12/17 .  Problem: Skin Integrity: Goal: Risk for impaired skin integrity will decrease Outcome: Completed/Met Date Met: 06/12/17 .  Problem: Tissue Perfusion: Goal: Risk factors for ineffective tissue perfusion will decrease Outcome: Progressing .  Problem: Activity: Goal: Risk for activity intolerance will decrease Outcome: Progressing .

## 2017-06-12 NOTE — Consult Note (Signed)
Consultation  Referring Provider: Dr. Darrick Meigs     Primary Care Physician:  Patient, No Pcp Per Primary Gastroenterologist:   Althia Forts      Reason for Consultation: Anemia             HPI:   Amber Medina is a 66 y.o. Caucasian female with a past medical history of chronic diastolic CHF, CKD stage II, morbid obesity, persistent A. fib maintained on Xarelto and others listed below, who presented to the hospital yesterday from her cardiologist office after evaluation for anemia.   Today, the patient describes that for the past 4 months or so she has been complaining of increasing weakness and shortness of breath with mobilization, she finally had labs drawn which showed a profound anemia with a hemoglobin of 5.4 yesterday. The patient describes that she has not been experiencing any abdominal pain, change in bowel habits, nausea, vomiting, heartburn or reflux. She has been experiencing progressive weakness and shortness of breath.   Patient denies fever, chills, blood in her stool, melena, weight loss, anorexia or symptoms that awaken her at night.  ED Course: In the ED repeat hemoglobin was 6.5, CT abdomen pelvis showed sigmoid colon diverticular disease without acute inflammation. 1 unit PRBC has been ordered.  Previous Gi History: None  Past Medical History:  Diagnosis Date  . Abnormal pulmonary function test 02/2017  . Chronic diastolic (congestive) heart failure ([place]) 10/11/2015  . CKD (chronic kidney disease), stage II   . Microcytic anemia    by labs 02/2017  . Morbid obesity ([place])   . Nocturnal hypoxemia   . Persistent atrial fibrillation ([place])   . Snoring   . Suspected sleep apnea     Past Surgical History:  Procedure Laterality Date  . TONSILLECTOMY      Family History  Problem Relation Age of Onset  . Diabetes Mellitus II Mother   . Hypertension Mother   . Congenital heart disease Mother   . Hypertension Father     Social History  Substance Use Topics  . Smoking  status: Former Research scientist (life sciences)  . Smokeless tobacco: Never Used  . Alcohol use Yes    Prior to Admission medications   Medication Sig Start Date End Date Taking? Authorizing Provider  acetaminophen (TYLENOL) 500 MG tablet Take 1,000 mg by mouth every 8 (eight) hours as needed for mild pain or moderate pain.   Yes [provider]  amiodarone (PACERONE) 200 MG tablet Take 1 tablet (200 mg total) by mouth daily. 03/27/17  Yes Turner, Eber Hong, MD  diltiazem (CARDIZEM CD) 240 MG 24 hr capsule Take 1 capsule (240 mg total) by mouth daily. 02/17/17  Yes Turner, Eber Hong, MD  furosemide (LASIX) 40 MG tablet Take 1 tablet (40 mg total) by mouth daily. 02/19/17 02/14/18 Yes Turner, Eber Hong, MD  metoprolol succinate (TOPROL-XL) 25 MG 24 hr tablet take 1 tablet by mouth once daily 09/16/16  Yes Turner, Traci R, MD  potassium chloride SA (K-DUR,KLOR-CON) 20 MEQ tablet Take 1 tablet (20 mEq total) by mouth 2 (two) times daily. 05/07/17  Yes [name], MD  XARELTO 20 MG TABS tablet take 1 tablet by mouth once daily with SUPPER 12/29/16  Yes Turner, Eber Hong, MD    Current Facility-Administered Medications  Medication Dose Route Frequency Provider Last Rate Last Dose  . 0.9 %  sodium chloride infusion   Intravenous Continuous Pisciotta, Elmyra Ricks, PA-C 75 mL/hr at 06/11/17 2230    . 0.9 %  sodium chloride infusion   Intravenous Once Oswald Hillock, MD      . acetaminophen (TYLENOL) tablet 650 mg  650 mg Oral Q6H PRN Oswald Hillock, MD       Or  . acetaminophen (TYLENOL) suppository 650 mg  650 mg Rectal Q6H PRN Oswald Hillock, MD      . furosemide (LASIX) injection 20 mg  20 mg Intravenous Once [place], Marge Duncans, MD      . metoprolol tartrate (LOPRESSOR) injection 2.5 mg  2.5 mg Intravenous Q12H Darrick Meigs, Marge Duncans, MD      . pantoprazole (PROTONIX) injection 40 mg  40 mg Intravenous Q12H Oswald Hillock, MD   40 mg at 06/11/17 2307  . peg 3350 powder (MOVIPREP) kit 200 g  1 kit Oral Once Levin Erp, [place]         Allergies as of 06/11/2017 - Review Complete 06/11/2017  Allergen Reaction Noted  . Compazine [prochlorperazine edisylate] Swelling 09/06/2015     Review of Systems:    Constitutional: No weight loss, fever or chills Skin: No rash  Cardiovascular: No chest pain Respiratory: Positive for SOB Gastrointestinal: See HPI and otherwise negative Genitourinary: No dysuria Neurological: No headache Musculoskeletal: No new muscle or joint pain Hematologic: No bleeding or bruising Psychiatric: No history of depression    Physical Exam:  Vital signs in last 24 hours: Temp:  [97.9 F (36.6 C)-99.4 F (37.4 C)] 97.9 F (36.6 C) (09/07 0505) Pulse Rate:  [60-75] 60 (09/07 0505) Resp:  [16-20] 18 (09/07 0505) BP: (105-150)/(35-98) 111/51 (09/07 0505) SpO2:  [96 %-100 %] 96 % (09/07 0505) Weight:  [320 lb (145.2 kg)-325 lb 2.9 oz (147.5 kg)] 325 lb 2.9 oz (147.5 kg) (09/06 1850) Last BM Date: 06/11/17 General:   Pleasant obese Caucasian female appears to be in NAD, Well developed, Well nourished, alert and cooperative Head:  Normocephalic and atraumatic. Eyes:   PEERL, EOMI. No icterus. Conjunctiva pink. Ears:  Normal auditory acuity. Neck:  Supple Throat: Oral cavity and pharynx without inflammation, swelling or lesion. Teeth in good condition.pale MM Lungs: Respirations even and unlabored. Lungs clear to auscultation bilaterally.   No wheezes, crackles, or rhonchi.  Heart: Normal S1, S2. No MRG. Regular rate and rhythm. No peripheral edema, cyanosis or pallor.  Abdomen:  Soft, nondistended, nontender. No rebound or guarding. Normal bowel sounds. No appreciable masses or hepatomegaly. Rectal:  Not performed.  Msk:  Symmetrical without gross deformities.  Extremities:  Without edema, no deformity or joint abnormality. Neurologic:  Alert and  oriented x4;  grossly normal neurologically.  Skin:   Dry and intact without significant lesions or rashes. Psychiatric:  Demonstrates good  judgement and reason without abnormal affect or behaviors.   LAB RESULTS:  Recent Labs  06/11/17 1159 06/11/17 1539 06/11/17 1554 06/12/17 0410  WBC 9.0 9.6  --  8.5  HGB 5.4* 5.6* 6.5* 5.7*  HCT 19.1* 20.7* 19.0* 20.6*  PLT 494* 451*  --  337   BMET  Recent Labs  06/11/17 1159 06/11/17 1539 06/11/17 1554 06/12/17 0410  NA 140 138 141 143  K 4.5 4.2 4.3 3.9  CL 106 101 105 109  CO2 24 24  --  26  GLUCOSE 125* 152* 154* 110*  BUN 25 28* 27* 25*  CREATININE 1.34* 1.48* 1.50* 1.19*  CALCIUM 9.3 8.7*  --  8.6*   LFT  Recent Labs  06/11/17 1539 06/12/17 0410  PROT 7.0 6.2*  ALBUMIN 3.5 3.0*  AST 23 16  ALT 28 22  ALKPHOS 75 64  BILITOT 1.0 1.2  BILIDIR <0.1*  --   IBILI NOT CALCULATED  --    PT/INR  Recent Labs  06/11/17 1539  LABPROT 23.6*  INR 2.13    STUDIES: Ct Abdomen Pelvis W Contrast  Result Date: 06/11/2017 CLINICAL DATA:  Weakness, iron deficiency anemia EXAM: CT ABDOMEN AND PELVIS WITH CONTRAST TECHNIQUE: Multidetector CT imaging of the abdomen and pelvis was performed using the standard protocol following bolus administration of intravenous contrast. CONTRAST:  139m ISOVUE-300 IOPAMIDOL (ISOVUE-300) INJECTION 61% COMPARISON:  None. FINDINGS: Lower chest: Lung bases demonstrate no acute consolidation or pleural effusion. Heart size within normal limits. Hepatobiliary: Calcified gallstones. No biliary dilatation or focal hepatic abnormality Pancreas: Unremarkable. No pancreatic ductal dilatation or surrounding inflammatory changes. Spleen: Normal in size without focal abnormality. Adrenals/Urinary Tract: Stable nodular thickening of the adrenal glands. Subcentimeter hypodense lesions in the kidneys too small to further characterize. Negative for hydronephrosis. The bladder is unremarkable. Stomach/Bowel: Stomach is within normal limits. Appendix appears normal. No evidence of bowel wall thickening, distention, or inflammatory changes. Mild diverticular  disease of the colon without acute inflammation Vascular/Lymphatic: Aortic atherosclerosis. No enlarged abdominal or pelvic lymph nodes. Reproductive: No adnexal mass. Possible small right fundal exophytic fibroid. Prominent endometrial stripe for age, measuring up to 9 mm on sagittal views. Prominent hypodensity at the cervix. Other: Negative for free air or free fluid. Musculoskeletal: Degenerative changes of the spine. No acute or suspicious bone lesion IMPRESSION: 1. No CT evidence for acute intra-abdominal or pelvic pathology 2. Gallstones 3. Sigmoid colon diverticular disease without acute inflammation 4. Suspected endometrial thickening, this may be correlated with a pelvic ultrasound. Electronically Signed   By: KDonavan FoilM.D.   On: 06/11/2017 16:42     PREVIOUS ENDOSCOPIES:            None   Impression / Plan:   Impression: 1. Anemia: Hemoglobin was 10.9 back in May, now down to 5.4 at time of admission, no change after 1 unit of PRBCs last night, currently 5.7, two further units of PRBCs are ordered this morning, Xarelto is on hold, consider upper versus lower GI source of blood loss versus other 2. Chronic anticoagulation: with Xarelto for Afib, on hold  Plan: 1. Scheduled patient for an EGD and colonoscopy tomorrow. This will be with Dr. HBenson Norwayas he is covering for our service this weekend. Did discuss risks, benefits, limitations and alternatives and patient agrees to proceed. 2. Placed patient on a clear liquid diet today and nothing by mouth after midnight 3. Ordered a prep to be completed by midnight per directions 4. Continue supportive measures including monitoring of hemoglobin, with transfusion less than 7 5. Continue to hold Xarelto 6. Please await further recommendations from Dr. NSilverio Decamplater today  Thank you for your kind consultation, we will continue to follow.  JLavone NianLemmon  06/12/2017, 10:05 AM Pager #: [phone]  Attending physician's note   I  have taken a history, examined the patient and reviewed the chart. I agree with the Advanced Practitioner's note, impression and recommendations. 89yr with h/o Afib on Xarelto admitted with anemia and heme positive stool. Patient noticed change in color of stool few months ago. Hold Xarelto Monitor Hgb and transfuse as needed Plan for EGD and colonoscopy by Dr HuBenson Norwayomorrow The risks and benefits as well as alternatives of endoscopic procedure(s) have been discussed and reviewed. All questions answered. The patient  agrees to proceed.   Damaris Hippo, MD [phone] Mon-Fri 8a-5p [phone] after 5p, weekends, holidays

## 2017-06-12 NOTE — Progress Notes (Addendum)
[facility]  PROGRESS NOTE  Amber Medina KCM:[id number] DOB: 1951-04-10 DOA: 06/11/2017 PCP: Patient, No Pcp Per   Brief HPI:    66 y.o. female, With history of atrial fibrillation on anticoagulation with Xarelto, chronic diastolic CHF, hypertension, chronic kidney disease stage II, morbid obesity who was sent from cardiology office for further evaluation of anemia. According to patient she has been having worsening shortness of breath over past several months since May. And she has gradually worsened. Today she felt worse and was not able to walk more than 10 feet without having to rest. She went to her cardiologist office. Lab work revealed severe anemia with hemoglobin of 5.4    Subjective   Patient denies nausea and vomiting. No abdominal pain. She only received 1 unit PRBC yesterday. Hemoglobin this morning was 5.7.   Assessment/Plan:     1. Anemia- likely from lower GI diverticular bleed as CT scan shows sigmoid diverticulosis. Agent is status post 1 unit PRBC. We'll transfuse 2 more units of PRBC. We'll give Lasix 20 mg IV 1 between 2 units. 2. Guaiac-positive stool- patient stool is guaiac-positive, seen by GI. Bowel prep for colonoscopy and endoscopy in a.m.Xarelto on hold 3. Atrial fibrillation-heart rate is controlled, Cardizem, amiodarone and metoprolol on hold. Patient was started on metoprolol 2.5 mg IV every 6 hours, which has been changed to 2.5 mg IV every 12 hours due to bradycardia. Anticoagulation is currently on hold. 4. Hypertension-blood pressure is stable, started on metoprolol as above. 5. History of diastolic CHF-currently well compensated. Lasix on hold    DVT prophylaxis: SCDs  Code Status: Full code  Family Communication: No family at bedside   Disposition Plan: Home after EGD and colonoscopy   Consultants:  Gastroenterology  Procedures:  None  Continuous infusions . sodium chloride 75 mL/hr at 06/11/17 2230      Antibiotics:    Anti-infectives    None       Objective   Vitals:   06/12/17 1051 06/12/17 1316 06/12/17 1400 06/12/17 1428  BP: 129/67 (!) 112/50 (!) 112/44 (!) 108/56  Pulse: 64 62 (!) 57 (!) 57  Resp: 18 18 18 18   Temp: 98 F (36.7 C) 98.1 F (36.7 C) 97.8 F (36.6 C) 98.5 F (36.9 C)  TempSrc: Oral Oral Oral Oral  SpO2: 95% 95% 95% 97%  Weight:      Height:        Intake/Output Summary (Last 24 hours) at 06/12/17 1536 Last data filed at 06/12/17 1316  Gross per 24 hour  Intake            854.5 ml  Output                0 ml  Net            854.5 ml   Filed Weights   06/11/17 1511 06/11/17 1850  Weight: (!) 145.2 kg (320 lb) (!) 147.5 kg (325 lb 2.9 oz)     Physical Examination:   Physical Exam: Eyes: No icterus, extraocular muscles intact  Mouth: Oral mucosa is moist, no lesions on palate,  Neck: Supple, no deformities, masses, or tenderness Lungs: Normal respiratory effort, bilateral clear to auscultation, no crackles or wheezes.  Heart: Regular rate and rhythm, S1 and S2 normal, no murmurs, rubs auscultated Abdomen: BS normoactive,soft,nondistended,non-tender to palpation,no organomegaly Extremities: No pretibial edema, no erythema, no cyanosis, no clubbing Neuro : Alert and oriented to time, place and person, No focal deficits  Skin: No  rashes seen on exam     Data Reviewed: I have personally reviewed following labs and imaging studies  CBG: No results for input(s): GLUCAP in the last 168 hours.  CBC:  Recent Labs Lab 06/11/17 1159 06/11/17 1539 06/11/17 1554 06/12/17 0410  WBC 9.0 9.6  --  8.5  HGB 5.4* 5.6* 6.5* 5.7*  HCT 19.1* 20.7* 19.0* 20.6*  MCV 62* 67.2*  --  68.0*  PLT 494* 451*  --  709    Basic Metabolic Panel:  Recent Labs Lab 06/11/17 1159 06/11/17 1539 06/11/17 1554 06/12/17 0410  NA 140 138 141 143  K 4.5 4.2 4.3 3.9  CL 106 101 105 109  CO2 24 24  --  26  GLUCOSE 125* 152* 154* 110*  BUN 25 28* 27* 25*  CREATININE 1.34*  1.48* 1.50* 1.19*  CALCIUM 9.3 8.7*  --  8.6*    No results found for this or any previous visit (from the past 240 hour(s)).   Liver Function Tests:  Recent Labs Lab 06/11/17 1159 06/11/17 1539 06/12/17 0410  AST 16 23 16   ALT 24 28 22   ALKPHOS 88 75 64  BILITOT 0.5 1.0 1.2  PROT 6.6 7.0 6.2*  ALBUMIN 3.7 3.5 3.0*   No results for input(s): LIPASE, AMYLASE in the last 168 hours. No results for input(s): AMMONIA in the last 168 hours.  Cardiac Enzymes: No results for input(s): CKTOTAL, CKMB, CKMBINDEX, TROPONINI in the last 168 hours. BNP (last 3 results) No results for input(s): BNP in the last 8760 hours.  ProBNP (last 3 results)  Recent Labs  06/11/17 1200  PROBNP 574*      Studies: Ct Abdomen Pelvis W Contrast  Result Date: 06/11/2017 CLINICAL DATA:  Weakness, iron deficiency anemia EXAM: CT ABDOMEN AND PELVIS WITH CONTRAST TECHNIQUE: Multidetector CT imaging of the abdomen and pelvis was performed using the standard protocol following bolus administration of intravenous contrast. CONTRAST:  169m ISOVUE-300 IOPAMIDOL (ISOVUE-300) INJECTION 61% COMPARISON:  None. FINDINGS: Lower chest: Lung bases demonstrate no acute consolidation or pleural effusion. Heart size within normal limits. Hepatobiliary: Calcified gallstones. No biliary dilatation or focal hepatic abnormality Pancreas: Unremarkable. No pancreatic ductal dilatation or surrounding inflammatory changes. Spleen: Normal in size without focal abnormality. Adrenals/Urinary Tract: Stable nodular thickening of the adrenal glands. Subcentimeter hypodense lesions in the kidneys too small to further characterize. Negative for hydronephrosis. The bladder is unremarkable. Stomach/Bowel: Stomach is within normal limits. Appendix appears normal. No evidence of bowel wall thickening, distention, or inflammatory changes. Mild diverticular disease of the colon without acute inflammation Vascular/Lymphatic: Aortic atherosclerosis.  No enlarged abdominal or pelvic lymph nodes. Reproductive: No adnexal mass. Possible small right fundal exophytic fibroid. Prominent endometrial stripe for age, measuring up to 9 mm on sagittal views. Prominent hypodensity at the cervix. Other: Negative for free air or free fluid. Musculoskeletal: Degenerative changes of the spine. No acute or suspicious bone lesion IMPRESSION: 1. No CT evidence for acute intra-abdominal or pelvic pathology 2. Gallstones 3. Sigmoid colon diverticular disease without acute inflammation 4. Suspected endometrial thickening, this may be correlated with a pelvic ultrasound. Electronically Signed   By: KDonavan FoilM.D.   On: 06/11/2017 16:42    Scheduled Meds: . metoprolol tartrate  2.5 mg Intravenous Q12H  . pantoprazole (PROTONIX) IV  40 mg Intravenous Q12H  . peg 3350 powder  0.5 kit Oral Once   And  . peg 3350 powder  0.5 kit Oral Once      Time spent:  25 min  [facility] Pager [phone]. If 7PM-7AM, please contact night-coverage at [email or web address], Office  [phone]  password [email or web address]  06/12/2017, 3:36 PM  LOS: 1 day

## 2017-06-13 ENCOUNTER — Encounter (HOSPITAL_COMMUNITY): Payer: Self-pay | Admitting: *Deleted

## 2017-06-13 ENCOUNTER — Inpatient Hospital Stay (HOSPITAL_COMMUNITY): Payer: Medicare (Managed Care) | Admitting: Certified Registered Nurse Anesthetist

## 2017-06-13 ENCOUNTER — Encounter (HOSPITAL_COMMUNITY)
Admission: EM | Disposition: A | Discharge status: Home / Self Care | Payer: Self-pay | Source: Home / Self Care | Attending: Family Medicine

## 2017-06-13 HISTORY — PX: ESOPHAGOGASTRODUODENOSCOPY (EGD) WITH PROPOFOL: SHX5813

## 2017-06-13 HISTORY — PX: COLONOSCOPY WITH PROPOFOL: SHX5780

## 2017-06-13 LAB — BASIC METABOLIC PANEL
Anion gap: 9 (ref 5–15)
BUN: 18 mg/dL (ref 6–20)
CHLORIDE: 112 mmol/L — AB (ref 101–111)
CO2: 24 mmol/L (ref 22–32)
Calcium: 8.9 mg/dL (ref 8.9–10.3)
Creatinine, Ser: 1.11 mg/dL — ABNORMAL HIGH (ref 0.44–1.00)
GFR calc Af Amer: 58 mL/min — ABNORMAL LOW (ref >60)
GFR calc non Af Amer: 50 mL/min — ABNORMAL LOW (ref >60)
GLUCOSE: 105 mg/dL — AB (ref 65–99)
POTASSIUM: 3.9 mmol/L (ref 3.5–5.1)
Sodium: 145 mmol/L (ref 135–145)

## 2017-06-13 LAB — CBC
HCT: 28 % — ABNORMAL LOW (ref 36.0–46.0)
Hemoglobin: 8 g/dL — ABNORMAL LOW (ref 12.0–15.0)
MCH: 20.9 pg — AB (ref 26.0–34.0)
MCHC: 28.6 g/dL — ABNORMAL LOW (ref 30.0–36.0)
MCV: 73.3 fL — AB (ref 78.0–100.0)
PLATELETS: 371 10*3/uL (ref 150–400)
RBC: 3.82 MIL/uL — AB (ref 3.87–5.11)
RDW: 21.6 % — ABNORMAL HIGH (ref 11.5–15.5)
WBC: 8.5 10*3/uL (ref 4.0–10.5)

## 2017-06-13 SURGERY — COLONOSCOPY WITH PROPOFOL
Anesthesia: Monitor Anesthesia Care

## 2017-06-13 MED ORDER — SPOT INK MARKER SYRINGE KIT
PACK | SUBMUCOSAL | Status: AC
  Filled 2017-06-13: qty 5

## 2017-06-13 MED ORDER — SPOT INK MARKER SYRINGE KIT
PACK | SUBMUCOSAL | Status: DC | PRN
  Administered 2017-06-13: 4 mL via SUBMUCOSAL

## 2017-06-13 MED ORDER — PROPOFOL 500 MG/50ML IV EMUL
INTRAVENOUS | Status: DC | PRN
  Administered 2017-06-13: 100 ug/kg/min via INTRAVENOUS

## 2017-06-13 MED ORDER — METOPROLOL TARTRATE 5 MG/5ML IV SOLN
2.5000 mg | Freq: Four times a day (QID) | INTRAVENOUS | Status: DC | PRN
Start: 2017-06-13 — End: 2017-06-16

## 2017-06-13 MED ORDER — ONDANSETRON HCL 4 MG/2ML IJ SOLN
INTRAMUSCULAR | Status: DC | PRN
  Administered 2017-06-13: 4 mg via INTRAVENOUS

## 2017-06-13 MED ORDER — PROPOFOL 10 MG/ML IV BOLUS
INTRAVENOUS | Status: AC
  Filled 2017-06-13: qty 40

## 2017-06-13 MED ORDER — SODIUM CHLORIDE 0.9 % IV SOLN: INTRAVENOUS | Status: DC

## 2017-06-13 MED ORDER — LIDOCAINE 2% (20 MG/ML) 5 ML SYRINGE
INTRAMUSCULAR | Status: AC
  Filled 2017-06-13: qty 5

## 2017-06-13 MED ORDER — LIDOCAINE 2% (20 MG/ML) 5 ML SYRINGE
INTRAMUSCULAR | Status: DC | PRN
  Administered 2017-06-13: 100 mg via INTRAVENOUS

## 2017-06-13 MED ORDER — LACTATED RINGERS IV SOLN
INTRAVENOUS | Status: DC
  Administered 2017-06-13: 11:00:00 via INTRAVENOUS

## 2017-06-13 MED ORDER — ONDANSETRON HCL 4 MG/2ML IJ SOLN
INTRAMUSCULAR | Status: AC
  Filled 2017-06-13: qty 2

## 2017-06-13 MED ORDER — PROPOFOL 10 MG/ML IV BOLUS
INTRAVENOUS | Status: AC
  Filled 2017-06-13: qty 20

## 2017-06-13 MED ORDER — PROPOFOL 10 MG/ML IV BOLUS
INTRAVENOUS | Status: DC | PRN
  Administered 2017-06-13: 20 mg via INTRAVENOUS

## 2017-06-13 SURGICAL SUPPLY — 25 items

## 2017-06-13 NOTE — H&P (View-Only) (Signed)
Consultation  Referring Provider: Dr. Darrick Meigs     Primary Care Physician:  Patient, No Pcp Per Primary Gastroenterologist:   Althia Forts      Reason for Consultation: Anemia             HPI:   Amber Medina is a 66 y.o. Caucasian female with a past medical history of chronic diastolic CHF, CKD stage II, morbid obesity, persistent A. fib maintained on Xarelto and others listed below, who presented to the hospital yesterday from her cardiologist office after evaluation for anemia.   Today, the patient describes that for the past 4 months or so she has been complaining of increasing weakness and shortness of breath with mobilization, she finally had labs drawn which showed a profound anemia with a hemoglobin of 5.4 yesterday. The patient describes that she has not been experiencing any abdominal pain, change in bowel habits, nausea, vomiting, heartburn or reflux. She has been experiencing progressive weakness and shortness of breath.   Patient denies fever, chills, blood in her stool, melena, weight loss, anorexia or symptoms that awaken her at night.  ED Course: In the ED repeat hemoglobin was 6.5, CT abdomen pelvis showed sigmoid colon diverticular disease without acute inflammation. 1 unit PRBC has been ordered.  Previous Gi History: None  Past Medical History:  Diagnosis Date  . Abnormal pulmonary function test 02/2017  . Chronic diastolic (congestive) heart failure ([place]) 10/11/2015  . CKD (chronic kidney disease), stage II   . Microcytic anemia    by labs 02/2017  . Morbid obesity ([place])   . Nocturnal hypoxemia   . Persistent atrial fibrillation ([place])   . Snoring   . Suspected sleep apnea     Past Surgical History:  Procedure Laterality Date  . TONSILLECTOMY      Family History  Problem Relation Age of Onset  . Diabetes Mellitus II Mother   . Hypertension Mother   . Congenital heart disease Mother   . Hypertension Father     Social History  Substance Use Topics  . Smoking  status: Former Research scientist (life sciences)  . Smokeless tobacco: Never Used  . Alcohol use Yes    Prior to Admission medications   Medication Sig Start Date End Date Taking? Authorizing Provider  acetaminophen (TYLENOL) 500 MG tablet Take 1,000 mg by mouth every 8 (eight) hours as needed for mild pain or moderate pain.   Yes [provider]  amiodarone (PACERONE) 200 MG tablet Take 1 tablet (200 mg total) by mouth daily. 03/27/17  Yes Turner, Eber Hong, MD  diltiazem (CARDIZEM CD) 240 MG 24 hr capsule Take 1 capsule (240 mg total) by mouth daily. 02/17/17  Yes Turner, Eber Hong, MD  furosemide (LASIX) 40 MG tablet Take 1 tablet (40 mg total) by mouth daily. 02/19/17 02/14/18 Yes Turner, Eber Hong, MD  metoprolol succinate (TOPROL-XL) 25 MG 24 hr tablet take 1 tablet by mouth once daily 09/16/16  Yes Turner, Traci R, MD  potassium chloride SA (K-DUR,KLOR-CON) 20 MEQ tablet Take 1 tablet (20 mEq total) by mouth 2 (two) times daily. 05/07/17  Yes [name], MD  XARELTO 20 MG TABS tablet take 1 tablet by mouth once daily with SUPPER 12/29/16  Yes Turner, Eber Hong, MD    Current Facility-Administered Medications  Medication Dose Route Frequency Provider Last Rate Last Dose  . 0.9 %  sodium chloride infusion   Intravenous Continuous Pisciotta, Elmyra Ricks, PA-C 75 mL/hr at 06/11/17 2230    . 0.9 %  sodium chloride infusion   Intravenous Once Oswald Hillock, MD      . acetaminophen (TYLENOL) tablet 650 mg  650 mg Oral Q6H PRN Oswald Hillock, MD       Or  . acetaminophen (TYLENOL) suppository 650 mg  650 mg Rectal Q6H PRN Oswald Hillock, MD      . furosemide (LASIX) injection 20 mg  20 mg Intravenous Once [place], Marge Duncans, MD      . metoprolol tartrate (LOPRESSOR) injection 2.5 mg  2.5 mg Intravenous Q12H Darrick Meigs, Marge Duncans, MD      . pantoprazole (PROTONIX) injection 40 mg  40 mg Intravenous Q12H Oswald Hillock, MD   40 mg at 06/11/17 2307  . peg 3350 powder (MOVIPREP) kit 200 g  1 kit Oral Once Levin Erp, [place]         Allergies as of 06/11/2017 - Review Complete 06/11/2017  Allergen Reaction Noted  . Compazine [prochlorperazine edisylate] Swelling 09/06/2015     Review of Systems:    Constitutional: No weight loss, fever or chills Skin: No rash  Cardiovascular: No chest pain Respiratory: Positive for SOB Gastrointestinal: See HPI and otherwise negative Genitourinary: No dysuria Neurological: No headache Musculoskeletal: No new muscle or joint pain Hematologic: No bleeding or bruising Psychiatric: No history of depression    Physical Exam:  Vital signs in last 24 hours: Temp:  [97.9 F (36.6 C)-99.4 F (37.4 C)] 97.9 F (36.6 C) (09/07 0505) Pulse Rate:  [60-75] 60 (09/07 0505) Resp:  [16-20] 18 (09/07 0505) BP: (105-150)/(35-98) 111/51 (09/07 0505) SpO2:  [96 %-100 %] 96 % (09/07 0505) Weight:  [320 lb (145.2 kg)-325 lb 2.9 oz (147.5 kg)] 325 lb 2.9 oz (147.5 kg) (09/06 1850) Last BM Date: 06/11/17 General:   Pleasant obese Caucasian female appears to be in NAD, Well developed, Well nourished, alert and cooperative Head:  Normocephalic and atraumatic. Eyes:   PEERL, EOMI. No icterus. Conjunctiva pink. Ears:  Normal auditory acuity. Neck:  Supple Throat: Oral cavity and pharynx without inflammation, swelling or lesion. Teeth in good condition.pale MM Lungs: Respirations even and unlabored. Lungs clear to auscultation bilaterally.   No wheezes, crackles, or rhonchi.  Heart: Normal S1, S2. No MRG. Regular rate and rhythm. No peripheral edema, cyanosis or pallor.  Abdomen:  Soft, nondistended, nontender. No rebound or guarding. Normal bowel sounds. No appreciable masses or hepatomegaly. Rectal:  Not performed.  Msk:  Symmetrical without gross deformities.  Extremities:  Without edema, no deformity or joint abnormality. Neurologic:  Alert and  oriented x4;  grossly normal neurologically.  Skin:   Dry and intact without significant lesions or rashes. Psychiatric:  Demonstrates good  judgement and reason without abnormal affect or behaviors.   LAB RESULTS:  Recent Labs  06/11/17 1159 06/11/17 1539 06/11/17 1554 06/12/17 0410  WBC 9.0 9.6  --  8.5  HGB 5.4* 5.6* 6.5* 5.7*  HCT 19.1* 20.7* 19.0* 20.6*  PLT 494* 451*  --  337   BMET  Recent Labs  06/11/17 1159 06/11/17 1539 06/11/17 1554 06/12/17 0410  NA 140 138 141 143  K 4.5 4.2 4.3 3.9  CL 106 101 105 109  CO2 24 24  --  26  GLUCOSE 125* 152* 154* 110*  BUN 25 28* 27* 25*  CREATININE 1.34* 1.48* 1.50* 1.19*  CALCIUM 9.3 8.7*  --  8.6*   LFT  Recent Labs  06/11/17 1539 06/12/17 0410  PROT 7.0 6.2*  ALBUMIN 3.5 3.0*  AST 23 16  ALT 28 22  ALKPHOS 75 64  BILITOT 1.0 1.2  BILIDIR <0.1*  --   IBILI NOT CALCULATED  --    PT/INR  Recent Labs  06/11/17 1539  LABPROT 23.6*  INR 2.13    STUDIES: Ct Abdomen Pelvis W Contrast  Result Date: 06/11/2017 CLINICAL DATA:  Weakness, iron deficiency anemia EXAM: CT ABDOMEN AND PELVIS WITH CONTRAST TECHNIQUE: Multidetector CT imaging of the abdomen and pelvis was performed using the standard protocol following bolus administration of intravenous contrast. CONTRAST:  139m ISOVUE-300 IOPAMIDOL (ISOVUE-300) INJECTION 61% COMPARISON:  None. FINDINGS: Lower chest: Lung bases demonstrate no acute consolidation or pleural effusion. Heart size within normal limits. Hepatobiliary: Calcified gallstones. No biliary dilatation or focal hepatic abnormality Pancreas: Unremarkable. No pancreatic ductal dilatation or surrounding inflammatory changes. Spleen: Normal in size without focal abnormality. Adrenals/Urinary Tract: Stable nodular thickening of the adrenal glands. Subcentimeter hypodense lesions in the kidneys too small to further characterize. Negative for hydronephrosis. The bladder is unremarkable. Stomach/Bowel: Stomach is within normal limits. Appendix appears normal. No evidence of bowel wall thickening, distention, or inflammatory changes. Mild diverticular  disease of the colon without acute inflammation Vascular/Lymphatic: Aortic atherosclerosis. No enlarged abdominal or pelvic lymph nodes. Reproductive: No adnexal mass. Possible small right fundal exophytic fibroid. Prominent endometrial stripe for age, measuring up to 9 mm on sagittal views. Prominent hypodensity at the cervix. Other: Negative for free air or free fluid. Musculoskeletal: Degenerative changes of the spine. No acute or suspicious bone lesion IMPRESSION: 1. No CT evidence for acute intra-abdominal or pelvic pathology 2. Gallstones 3. Sigmoid colon diverticular disease without acute inflammation 4. Suspected endometrial thickening, this may be correlated with a pelvic ultrasound. Electronically Signed   By: KDonavan FoilM.D.   On: 06/11/2017 16:42     PREVIOUS ENDOSCOPIES:            None   Impression / Plan:   Impression: 1. Anemia: Hemoglobin was 10.9 back in May, now down to 5.4 at time of admission, no change after 1 unit of PRBCs last night, currently 5.7, two further units of PRBCs are ordered this morning, Xarelto is on hold, consider upper versus lower GI source of blood loss versus other 2. Chronic anticoagulation: with Xarelto for Afib, on hold  Plan: 1. Scheduled patient for an EGD and colonoscopy tomorrow. This will be with Dr. HBenson Norwayas he is covering for our service this weekend. Did discuss risks, benefits, limitations and alternatives and patient agrees to proceed. 2. Placed patient on a clear liquid diet today and nothing by mouth after midnight 3. Ordered a prep to be completed by midnight per directions 4. Continue supportive measures including monitoring of hemoglobin, with transfusion less than 7 5. Continue to hold Xarelto 6. Please await further recommendations from Dr. NSilverio Decamplater today  Thank you for your kind consultation, we will continue to follow.  JLavone NianLemmon  06/12/2017, 10:05 AM Pager #: [phone]  Attending physician's note   I  have taken a history, examined the patient and reviewed the chart. I agree with the Advanced Practitioner's note, impression and recommendations. 89yr with h/o Afib on Xarelto admitted with anemia and heme positive stool. Patient noticed change in color of stool few months ago. Hold Xarelto Monitor Hgb and transfuse as needed Plan for EGD and colonoscopy by Dr HuBenson Norwayomorrow The risks and benefits as well as alternatives of endoscopic procedure(s) have been discussed and reviewed. All questions answered. The patient  agrees to proceed.   Damaris Hippo, MD [phone] Mon-Fri 8a-5p [phone] after 5p, weekends, holidays

## 2017-06-13 NOTE — Op Note (Signed)
[facility] Patient Name: Amber Medina Procedure Date: 06/13/2017 MRN: [id number] Attending MD: Carol Ada , MD Date of Birth: November 16, 1950 CSN: [id number] Age: 66 Admit Type: Inpatient Procedure:                Colonoscopy Indications:              Iron deficiency anemia Providers:                Carol Ada, MD, Vista Lawman, RN, Elspeth Cho                            Tech., Technician, Christell Faith, CRNA Referring MD:              Medicines:                Propofol per Anesthesia Complications:            No immediate complications. Estimated Blood Loss:     Estimated blood loss was minimal. Procedure:                Pre-Anesthesia Assessment:                           - Prior to the procedure, a History and Physical                            was performed, and patient medications and                            allergies were reviewed. The patient's tolerance of                            previous anesthesia was also reviewed. The risks                            and benefits of the procedure and the sedation                            options and risks were discussed with the patient.                            All questions were answered, and informed consent                            was obtained. Prior Anticoagulants: The patient has                            taken Xarelto (rivaroxaban), last dose was 1 day                            prior to procedure. ASA Grade Assessment: III - A                            patient with severe systemic disease. After  reviewing the risks and benefits, the patient was                            deemed in satisfactory condition to undergo the                            procedure.                           - Sedation was administered by an anesthesia                            professional. Deep sedation was attained.                           After obtaining informed consent, the colonoscope                       was passed under direct vision. Throughout the                            procedure, the patient's blood pressure, pulse, and                            oxygen saturations were monitored continuously. The                            EC-[id number] ([id number]) scope was introduced through                            the anus and advanced to the the terminal ileum.                            The colonoscopy was performed without difficulty.                            The patient tolerated the procedure well. The                            quality of the bowel preparation was good. The                            terminal ileum, ileocecal valve, appendiceal                            orifice, and rectum were photographed. Scope In: 11:09:10 AM Scope Out: 11:32:17 AM Scope Withdrawal Time: 0 hours 20 minutes 15 seconds  Total Procedure Duration: 0 hours 23 minutes 7 seconds  Findings:      A polypoid non-obstructing large mass was found in the ascending colon.       The mass was non-circumferential. The mass measured four cm in length.       No bleeding was present. This was biopsied with a cold snare for       histology. Area was tattooed with an injection of 3 mL of Spot (carbon  black).      Two sessile polyps were found in the sigmoid colon and ascending colon.       The polyps were 3 to 4 mm in size. These polyps were removed with a cold       snare. Resection and retrieval were complete.      Scattered small and large-mouthed diverticula were found in the sigmoid       colon and descending colon.      In the most distal portion of the ascending colon a 4 mm polyp was       removed with a cold snare. Withdrawal of the colonoscope in the mid       portion of the ascending colon revealed the mass. A sessile polyp was       found next to the mass. Proximal to the mass a nother polyp was       identified, but not removed with the findings of the mass. This area was        tattooed with SPOT. 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                            Biopsied. Tattooed.                           - Two 3 to 4 mm polyps in the sigmoid colon and in                            the ascending colon, removed with a cold snare.                            Resected and retrieved.                           - Diverticulosis in the sigmoid colon and in the                            descending colon. Moderate Sedation:      N/A- Per Anesthesia Care Recommendation:           - Return patient to hospital ward for ongoing care.                           - Resume regular diet.                           - Continue present medications.                           - Await pathology results.                           - Repeat colonoscopy in 1 year for surveillance.                           - Surgical consultation.                           -  Continue to hold Xarelto as the mass is the                            source of her anemia. Procedure Code(s):        --- Professional ---                           [phone], Colonoscopy, flexible; with removal of                            tumor(s), polyp(s), or other lesion(s) by snare                            technique                           [id number], Colonoscopy, flexible; with directed                            submucosal injection(s), any substance Diagnosis Code(s):        --- Professional ---                           D49.0, Neoplasm of unspecified behavior of                            digestive system                           D12.5, Benign neoplasm of sigmoid colon                           D12.2, Benign neoplasm of ascending colon                           D50.9, Iron deficiency anemia, unspecified                           K57.30, Diverticulosis of large intestine without                            perforation or abscess without bleeding CPT copyright 2016 American Medical  Association. All rights reserved. The codes documented in this report are preliminary and upon coder review may  be revised to meet current compliance requirements. Carol Ada, MD Carol Ada, MD 06/13/2017 11:43:59 AM This report has been signed electronically. Number of Addenda: 0

## 2017-06-13 NOTE — Op Note (Signed)
[facility] Patient Name: Amber Medina Procedure Date: 06/13/2017 MRN: [id number] Attending MD: Carol Ada , MD Date of Birth: 05/09/1951 CSN: [id number] Age: 66 Admit Type: Inpatient Procedure:                Upper GI endoscopy Indications:              Iron deficiency anemia Providers:                Carol Ada, MD, Vista Lawman, RN, Elspeth Cho                            Tech., Technician, Christell Faith, CRNA Referring MD:              Medicines:                Propofol per Anesthesia Complications:            No immediate complications. Estimated Blood Loss:     Estimated blood loss: none. Procedure:                Pre-Anesthesia Assessment:                           - Prior to the procedure, a History and Physical                            was performed, and patient medications and                            allergies were reviewed. The patient's tolerance of                            previous anesthesia was also reviewed. The risks                            and benefits of the procedure and the sedation                            options and risks were discussed with the patient.                            All questions were answered, and informed consent                            was obtained. Prior Anticoagulants: The patient has                            taken no previous anticoagulant or antiplatelet                            agents. ASA Grade Assessment: III - A patient with                            severe systemic disease. After reviewing the risks  and benefits, the patient was deemed in                            satisfactory condition to undergo the procedure.                           - Sedation was administered by an anesthesia                            professional. Deep sedation was attained.                           After obtaining informed consent, the endoscope was                            passed under  direct vision. Throughout the                            procedure, the patient's blood pressure, pulse, and                            oxygen saturations were monitored continuously. The                            EC-[id number] ([id number]) scope was introduced through                            the mouth, and advanced to the second part of                            duodenum. The upper GI endoscopy was accomplished                            without difficulty. The patient tolerated the                            procedure well. Scope In: Scope Out: Findings:      The esophagus was normal.      The stomach was normal.      The examined duodenum was normal. Impression:               - Normal esophagus.                           - Normal stomach.                           - Normal examined duodenum.                           - No specimens collected. Moderate Sedation:      N/A- Per Anesthesia Care Recommendation:           - Proceed with the colonoscopy.                           - Return patient  to hospital ward for ongoing care.                           - Resume previous diet.                           - Continue present medications. Procedure Code(s):        --- Professional ---                           [id number], Esophagogastroduodenoscopy, flexible,                            transoral; diagnostic, including collection of                            specimen(s) by brushing or washing, when performed                            (separate procedure) Diagnosis Code(s):        --- Professional ---                           D50.9, Iron deficiency anemia, unspecified CPT copyright 2016 [employer]. All rights reserved. The codes documented in this report are preliminary and upon coder review may  be revised to meet current compliance requirements. Carol Ada, MD Carol Ada, MD 06/13/2017 11:46:54 AM This report has been signed electronically. Number of Addenda: 0

## 2017-06-13 NOTE — Anesthesia Preprocedure Evaluation (Signed)
Anesthesia Evaluation  Patient identified by MRN, date of birth, ID band Patient awake    Reviewed: Allergy & Precautions, NPO status , Patient's Chart, lab work & pertinent test results, reviewed documented beta blocker date and time   Airway Mallampati: II  TM Distance: >3 FB Neck ROM: Full    Dental  (+) Teeth Intact, Dental Advisory Given   Pulmonary neg pulmonary ROS, sleep apnea , former smoker,    breath sounds clear to auscultation       Cardiovascular hypertension, Pt. on medications and Pt. on home beta blockers +CHF  + dysrhythmias Atrial Fibrillation  Rhythm:Regular     Neuro/Psych negative neurological ROS     GI/Hepatic negative GI ROS, Neg liver ROS,   Endo/Other  negative endocrine ROS  Renal/GU CRFRenal disease     Musculoskeletal negative musculoskeletal ROS (+)   Abdominal (+) + obese,   Peds  Hematology negative hematology ROS (+)   Anesthesia Other Findings Morbid Obesity  Reproductive/Obstetrics                             Lab Results  Component Value Date   WBC 8.5 06/13/2017   HGB 8.0 (L) 06/13/2017   HCT 28.0 (L) 06/13/2017   MCV 73.3 (L) 06/13/2017   PLT 371 06/13/2017   Echo 2017: - Left ventricle: The cavity size was normal. There was mild   concentric hypertrophy. Systolic function was normal. The   estimated ejection fraction was in the range of 60% to 65%. Wall   motion was normal; there were no regional wall motion   abnormalities. Doppler parameters are consistent with abnormal   left ventricular relaxation (grade 1 diastolic dysfunction).   Doppler parameters are consistent with high ventricular filling   pressure. - Right ventricle: The cavity size was normal. Wall thickness was   normal. - Tricuspid valve: There was mild regurgitation. - Inferior vena cava: The vessel was normal in size. The   respirophasic diameter changes were in the normal  range (>= 50%),   consistent with normal central venous pressure.  Anesthesia Physical Anesthesia Plan  ASA: III  Anesthesia Plan: MAC   Post-op Pain Management:    Induction: Intravenous  PONV Risk Score and Plan: 2 and Ondansetron and Propofol infusion  Airway Management Planned: Natural Airway and Nasal Cannula  Additional Equipment:   Intra-op Plan:   Post-operative Plan:   Informed Consent: I have reviewed the patients History and Physical, chart, labs and discussed the procedure including the risks, benefits and alternatives for the proposed anesthesia with the patient or authorized representative who has indicated his/her understanding and acceptance.     Plan Discussed with:   Anesthesia Plan Comments:         Anesthesia Quick Evaluation

## 2017-06-13 NOTE — Transfer of Care (Signed)
Immediate Anesthesia Transfer of Care Note  Patient: Amber Medina  Procedure(s) Performed: Procedure(s): COLONOSCOPY WITH PROPOFOL (N/A) ESOPHAGOGASTRODUODENOSCOPY (EGD) WITH PROPOFOL (N/A)  Patient Location: PACU  Anesthesia Type:MAC  Level of Consciousness:  sedated, patient cooperative and responds to stimulation  Airway & Oxygen Therapy:Patient Spontanous Breathing and Patient connected to face mask oxgen  Post-op Assessment:  Report given to PACU RN and Post -op Vital signs reviewed and stable  Post vital signs:  Reviewed and stable  Last Vitals:  Vitals:   06/13/17 0456 06/13/17 1050  BP: (!) 146/107 (!) 151/94  Pulse: (!) 45 61  Resp: 18 17  Temp: 36.6 C 36.6 C  SpO2: 03% 47%    Complications: No apparent anesthesia complications

## 2017-06-13 NOTE — Anesthesia Postprocedure Evaluation (Signed)
Anesthesia Post Note  Patient: Amber Medina  Procedure(s) Performed: Procedure(s) (LRB): COLONOSCOPY WITH PROPOFOL (N/A) ESOPHAGOGASTRODUODENOSCOPY (EGD) WITH PROPOFOL (N/A)     Patient location during evaluation: PACU Anesthesia Type: MAC Level of consciousness: awake and alert Pain management: pain level controlled Vital Signs Assessment: post-procedure vital signs reviewed and stable Respiratory status: spontaneous breathing, nonlabored ventilation, respiratory function stable and patient connected to nasal cannula oxygen Cardiovascular status: stable and blood pressure returned to baseline Anesthetic complications: no    Last Vitals:  Vitals:   06/13/17 1143 06/13/17 1153  BP: (!) 98/43 (!) 119/46  Pulse: (!) 59 (!) 56  Resp: 20 14  Temp: 36.6 C   SpO2: 99% 94%    Last Pain:  Vitals:   06/13/17 1143  TempSrc: Oral                 Effie Berkshire

## 2017-06-13 NOTE — Anesthesia Procedure Notes (Signed)
Procedure Name: MAC Date/Time: 06/13/2017 10:58 AM Performed by: West Pugh Pre-anesthesia Checklist: Patient identified, Emergency Drugs available, Suction available, Patient being monitored and Timeout performed Patient Re-evaluated:Patient Re-evaluated prior to induction Oxygen Delivery Method: Nasal cannula Preoxygenation: Pre-oxygenation with 100% oxygen Placement Confirmation: positive ETCO2,  CO2 detector and breath sounds checked- equal and bilateral Dental Injury: Teeth and Oropharynx as per pre-operative assessment

## 2017-06-13 NOTE — Progress Notes (Signed)
[facility]  PROGRESS NOTE  Amber Medina KWI:[id number] DOB: 1950/11/03 DOA: 06/11/2017 PCP: Patient, No Pcp Per   Brief HPI:    66 y.o. female, With history of atrial fibrillation on anticoagulation with Xarelto, chronic diastolic CHF, hypertension, chronic kidney disease stage II, morbid obesity who was sent from cardiology office for further evaluation of anemia. According to patient she has been having worsening shortness of breath over past several months since May. And she has gradually worsened. Today she felt worse and was not able to walk more than 10 feet without having to rest. She went to her cardiologist office. Lab work revealed severe anemia with hemoglobin of 5.4    Subjective   Patient seen and examined, became bradycardic last night. Heart regular drop into 30s after she received  IV metoprolol.   Assessment/Plan:     1. Anemia- likely from lower GI diverticular bleed as CT scan shows sigmoid diverticulosis. Patient is status post 3 units PRBC. Hemoglobin this morning is 8.0.  2. Guaiac-positive stool- patient stool is guaiac-positive, seen by GI. Bowel prep done forr colonoscopy and endoscopy today. Xarelto on hold. Will follow the results of endoscopy and colonoscopy. 3. Atrial fibrillation-heart rate is controlled, Cardizem, amiodarone and metoprolol on hold. Patient was started on metoprolol 2.5 mg IV every 6 hours, which has been changed to 2.5 mg IV every 6 hours when necessary  due to bradycardia. Anticoagulation is currently on hold. Will restart amiodarone after the procedure. 4. Hypertension-blood pressure is stable, started on when necessary metoprolol as above. 5. History of diastolic CHF-currently well compensated. Lasix on hold    DVT prophylaxis: SCDs  Code Status: Full code  Family Communication: No family at bedside   Disposition Plan: Home after EGD and colonoscopy   Consultants:  Gastroenterology  Procedures:  None  Continuous  infusions . sodium chloride 75 mL/hr at 06/13/17 0650      Antibiotics:   Anti-infectives    None       Objective   Vitals:   06/13/17 0456 06/13/17 1050 06/13/17 1143 06/13/17 1153  BP: (!) 146/107 (!) 151/94 (!) 98/43 (!) 119/46  Pulse: (!) 45 61 (!) 59 (!) 56  Resp: 18 17 20 14   Temp: 97.8 F (36.6 C) 97.9 F (36.6 C) 97.8 F (36.6 C)   TempSrc: Oral Oral Oral   SpO2: 96% 98% 99% 94%  Weight:      Height: 5\' 6"  (1.676 m)       Intake/Output Summary (Last 24 hours) at 06/13/17 1258 Last data filed at 06/13/17 1144  Gross per 24 hour  Intake             2815 ml  Output                0 ml  Net             2815 ml   Filed Weights   06/11/17 1511 06/11/17 1850  Weight: (!) 145.2 kg (320 lb) (!) 147.5 kg (325 lb 2.9 oz)     Physical Examination:   Physical Exam: Eyes: No icterus, extraocular muscles intact  Mouth: Oral mucosa is moist, no lesions on palate,  Neck: Supple, no deformities, masses, or tenderness Lungs: Normal respiratory effort, bilateral clear to auscultation, no crackles or wheezes.  Heart: Regular rate and rhythm, S1 and S2 normal, no murmurs, rubs auscultated Abdomen: BS normoactive,soft,nondistended,non-tender to palpation,no organomegaly Extremities: No pretibial edema, no erythema, no cyanosis, no clubbing Neuro : Alert and  oriented to time, place and person, No focal deficits     Data Reviewed: I have personally reviewed following labs and imaging studies  CBG: No results for input(s): GLUCAP in the last 168 hours.  CBC:  Recent Labs Lab 06/11/17 1159 06/11/17 1539 06/11/17 1554 06/12/17 0410 06/12/17 1827 06/13/17 0522  WBC 9.0 9.6  --  8.5  --  8.5  HGB 5.4* 5.6* 6.5* 5.7* 8.7* 8.0*  HCT 19.1* 20.7* 19.0* 20.6* 30.6* 28.0*  MCV 62* 67.2*  --  68.0*  --  73.3*  PLT 494* 451*  --  337  --  299    Basic Metabolic Panel:  Recent Labs Lab 06/11/17 1159 06/11/17 1539 06/11/17 1554 06/12/17 0410 06/13/17 0522   NA 140 138 141 143 145  K 4.5 4.2 4.3 3.9 3.9  CL 106 101 105 109 112*  CO2 24 24  --  26 24  GLUCOSE 125* 152* 154* 110* 105*  BUN 25 28* 27* 25* 18  CREATININE 1.34* 1.48* 1.50* 1.19* 1.11*  CALCIUM 9.3 8.7*  --  8.6* 8.9    No results found for this or any previous visit (from the past 240 hour(s)).   Liver Function Tests:  Recent Labs Lab 06/11/17 1159 06/11/17 1539 06/12/17 0410  AST 16 23 16   ALT 24 28 22   ALKPHOS 88 75 64  BILITOT 0.5 1.0 1.2  PROT 6.6 7.0 6.2*  ALBUMIN 3.7 3.5 3.0*   No results for input(s): LIPASE, AMYLASE in the last 168 hours. No results for input(s): AMMONIA in the last 168 hours.  Cardiac Enzymes: No results for input(s): CKTOTAL, CKMB, CKMBINDEX, TROPONINI in the last 168 hours. BNP (last 3 results) No results for input(s): BNP in the last 8760 hours.  ProBNP (last 3 results)  Recent Labs  06/11/17 1200  PROBNP 574*      Studies: Ct Abdomen Pelvis W Contrast  Result Date: 06/11/2017 CLINICAL DATA:  Weakness, iron deficiency anemia EXAM: CT ABDOMEN AND PELVIS WITH CONTRAST TECHNIQUE: Multidetector CT imaging of the abdomen and pelvis was performed using the standard protocol following bolus administration of intravenous contrast. CONTRAST:  161mL ISOVUE-300 IOPAMIDOL (ISOVUE-300) INJECTION 61% COMPARISON:  None. FINDINGS: Lower chest: Lung bases demonstrate no acute consolidation or pleural effusion. Heart size within normal limits. Hepatobiliary: Calcified gallstones. No biliary dilatation or focal hepatic abnormality Pancreas: Unremarkable. No pancreatic ductal dilatation or surrounding inflammatory changes. Spleen: Normal in size without focal abnormality. Adrenals/Urinary Tract: Stable nodular thickening of the adrenal glands. Subcentimeter hypodense lesions in the kidneys too small to further characterize. Negative for hydronephrosis. The bladder is unremarkable. Stomach/Bowel: Stomach is within normal limits. Appendix appears normal.  No evidence of bowel wall thickening, distention, or inflammatory changes. Mild diverticular disease of the colon without acute inflammation Vascular/Lymphatic: Aortic atherosclerosis. No enlarged abdominal or pelvic lymph nodes. Reproductive: No adnexal mass. Possible small right fundal exophytic fibroid. Prominent endometrial stripe for age, measuring up to 9 mm on sagittal views. Prominent hypodensity at the cervix. Other: Negative for free air or free fluid. Musculoskeletal: Degenerative changes of the spine. No acute or suspicious bone lesion IMPRESSION: 1. No CT evidence for acute intra-abdominal or pelvic pathology 2. Gallstones 3. Sigmoid colon diverticular disease without acute inflammation 4. Suspected endometrial thickening, this may be correlated with a pelvic ultrasound. Electronically Signed   By: Donavan Foil M.D.   On: 06/11/2017 16:42    Scheduled Meds: . pantoprazole (PROTONIX) IV  40 mg Intravenous Q12H  Time spent: 25 min  [facility] Pager [phone]. If 7PM-7AM, please contact night-coverage at [email or web address], Office  [phone]  password [place]  06/13/2017, 12:58 PM  LOS: 2 days

## 2017-06-13 NOTE — Interval H&P Note (Signed)
History and Physical Interval Note:  06/13/2017 10:48 AM  Amber Medina  has presented today for surgery, with the diagnosis of IDA  The various methods of treatment have been discussed with the patient and family. After consideration of risks, benefits and other options for treatment, the patient has consented to  Procedure(s): COLONOSCOPY WITH PROPOFOL (N/A) ESOPHAGOGASTRODUODENOSCOPY (EGD) WITH PROPOFOL (N/A) as a surgical intervention .  The patient's history has been reviewed, patient examined, no change in status, stable for surgery.  I have reviewed the patient's chart and labs.  Questions were answered to the patient's satisfaction.     [name]

## 2017-06-14 LAB — BASIC METABOLIC PANEL
ANION GAP: 8 (ref 5–15)
BUN: 18 mg/dL (ref 6–20)
CALCIUM: 8.4 mg/dL — AB (ref 8.9–10.3)
CO2: 21 mmol/L — AB (ref 22–32)
Chloride: 110 mmol/L (ref 101–111)
Creatinine, Ser: 1.04 mg/dL — ABNORMAL HIGH (ref 0.44–1.00)
GFR, EST NON AFRICAN AMERICAN: 54 mL/min — AB (ref >60)
Glucose, Bld: 103 mg/dL — ABNORMAL HIGH (ref 65–99)
Potassium: 4.1 mmol/L (ref 3.5–5.1)
Sodium: 139 mmol/L (ref 135–145)

## 2017-06-14 LAB — CBC
HCT: 26.1 % — ABNORMAL LOW (ref 36.0–46.0)
HEMOGLOBIN: 7.4 g/dL — AB (ref 12.0–15.0)
MCH: 21.2 pg — ABNORMAL LOW (ref 26.0–34.0)
MCHC: 28.4 g/dL — ABNORMAL LOW (ref 30.0–36.0)
MCV: 74.8 fL — ABNORMAL LOW (ref 78.0–100.0)
Platelets: 313 10*3/uL (ref 150–400)
RBC: 3.49 MIL/uL — ABNORMAL LOW (ref 3.87–5.11)
RDW: 22.8 % — AB (ref 11.5–15.5)
WBC: 11.4 10*3/uL — AB (ref 4.0–10.5)

## 2017-06-14 LAB — PREPARE RBC (CROSSMATCH)

## 2017-06-14 MED ORDER — POTASSIUM CHLORIDE CRYS ER 20 MEQ PO TBCR
20.0000 meq | EXTENDED_RELEASE_TABLET | Freq: Two times a day (BID) | ORAL | Status: DC
  Administered 2017-06-14 – 2017-06-16 (×5): 20 meq via ORAL
  Filled 2017-06-14 (×5): qty 1

## 2017-06-14 MED ORDER — DILTIAZEM HCL ER COATED BEADS 240 MG PO CP24: 240.0000 mg | ORAL_CAPSULE | Freq: Every day | ORAL | Status: DC

## 2017-06-14 MED ORDER — AMIODARONE HCL 200 MG PO TABS
200.0000 mg | ORAL_TABLET | Freq: Every day | ORAL | Status: DC
  Administered 2017-06-14 – 2017-06-16 (×3): 200 mg via ORAL
  Filled 2017-06-14 (×3): qty 1

## 2017-06-14 MED ORDER — SODIUM CHLORIDE 0.9 % IV SOLN
510.0000 mg | Freq: Once | INTRAVENOUS | Status: AC
  Administered 2017-06-15: 510 mg via INTRAVENOUS
  Filled 2017-06-14: qty 17

## 2017-06-14 MED ORDER — FUROSEMIDE 40 MG PO TABS: 40.0000 mg | ORAL_TABLET | Freq: Every day | ORAL | Status: DC

## 2017-06-14 MED ORDER — METOPROLOL SUCCINATE ER 25 MG PO TB24
25.0000 mg | ORAL_TABLET | Freq: Every day | ORAL | Status: DC
  Administered 2017-06-14 – 2017-06-16 (×3): 25 mg via ORAL
  Filled 2017-06-14 (×3): qty 1

## 2017-06-14 MED ORDER — SODIUM CHLORIDE 0.9 % IV SOLN
Freq: Once | INTRAVENOUS | Status: AC
  Administered 2017-06-14: 11:00:00 via INTRAVENOUS

## 2017-06-14 NOTE — Progress Notes (Signed)
[facility]  PROGRESS NOTE  Amber Medina BHA:[id number] DOB: 07-27-1951 DOA: 06/11/2017 PCP: Patient, No Pcp Per   Brief HPI:    66 y.o. female, With history of atrial fibrillation on anticoagulation with Xarelto, chronic diastolic CHF, hypertension, chronic kidney disease stage II, morbid obesity who was sent from cardiology office for further evaluation of anemia. According to patient she has been having worsening shortness of breath over past several months since May. And she has gradually worsened. Today she felt worse and was not able to walk more than 10 feet without having to rest. She went to her cardiologist office. Lab work revealed severe anemia with hemoglobin of 5.4    Subjective   Patient status post colonoscopy which showed nonobstructing mass in ascending colon. Biopsy is currently pending. Denies pain, no nausea or vomiting. Started on regular diet.   Assessment/Plan:     1. Anemia- likely from lower GI diverticular bleed as CT scan shows sigmoid diverticulosis. Patient is status post 3 units PRBC. Hemoglobin this morning is 7.4 this morning. We'll transfuse 1 more unit PRBC. Check CBC in a.m. 2. Mass in ascending colon- patient stool was guaiac-positive, underwent colonoscopy which showed nonobstructing mass in ascending colon. Mass was biopsied and the results are pending. Xarelto currently  On hold 3. Atrial fibrillation-heart rate is controlled, Cardizem, amiodarone and metoprolol were held as patient was nothing by mouth,. Patient was started on metoprolol 2.5 mg IV every 6 hours, which was changed to 2.5 mg IV every 6 hours when necessary  due to bradycardia. Anticoagulation is currently on hold. Will restart amiodarone, Toprol-XL. Will hold Cardizem at this time as patient blood pressure is soft. 4. Hypertension-blood pressure is stable 5. History of diastolic CHF-currently well compensated. Lasix on hold    DVT prophylaxis: SCDs  Code Status: Full code  Family  Communication: No family at bedside   Disposition Plan: Home after EGD and colonoscopy   Consultants:  Gastroenterology  Procedures:  None  Continuous infusions . sodium chloride        Antibiotics:   Anti-infectives    None       Objective   Vitals:   06/13/17 2131 06/14/17 0550 06/14/17 1307 06/14/17 1352  BP: (!) 137/57 (!) 117/58 (!) 155/62 (!) 150/72  Pulse: 71 70 72 72  Resp: 18 18 16 16   Temp: 98.9 F (37.2 C) 98.2 F (36.8 C) 98 F (36.7 C) 97.8 F (36.6 C)  TempSrc: Oral Oral Oral Oral  SpO2: 98% 96% 94% 94%  Weight:      Height:        Intake/Output Summary (Last 24 hours) at 06/14/17 1357 Last data filed at 06/14/17 0848  Gross per 24 hour  Intake              640 ml  Output                0 ml  Net              640 ml   Filed Weights   06/11/17 1511 06/11/17 1850  Weight: (!) 145.2 kg (320 lb) (!) 147.5 kg (325 lb 2.9 oz)     Physical Examination:   Physical Exam: Eyes: No icterus, extraocular muscles intact  Mouth: Oral mucosa is moist, no lesions on palate,  Neck: Supple, no deformities, masses, or tenderness Lungs: Normal respiratory effort, bilateral clear to auscultation, no crackles or wheezes.  Heart: Regular rate and rhythm, S1 and S2 normal, no  murmurs, rubs auscultated Abdomen: BS normoactive,soft,nondistended,non-tender to palpation,no organomegaly Extremities: No pretibial edema, no erythema, no cyanosis, no clubbing Neuro : Alert and oriented to time, place and person, No focal deficits Skin: No rashes seen on exam     Data Reviewed: I have personally reviewed following labs and imaging studies  CBG: No results for input(s): GLUCAP in the last 168 hours.  CBC:  Recent Labs Lab 06/11/17 1159  06/11/17 1539 06/11/17 1554 06/12/17 0410 06/12/17 1827 06/13/17 0522 06/14/17 0504  WBC 9.0  --  9.6  --  8.5  --  8.5 11.4*  HGB 5.4*  < > 5.6* 6.5* 5.7* 8.7* 8.0* 7.4*  HCT 19.1*  < > 20.7* 19.0* 20.6* 30.6*  28.0* 26.1*  MCV 62*  --  67.2*  --  68.0*  --  73.3* 74.8*  PLT 494*  --  451*  --  337  --  371 313  < > = values in this interval not displayed.  Basic Metabolic Panel:  Recent Labs Lab 06/11/17 1159 06/11/17 1539 06/11/17 1554 06/12/17 0410 06/13/17 0522 06/14/17 0504  NA 140 138 141 143 145 139  K 4.5 4.2 4.3 3.9 3.9 4.1  CL 106 101 105 109 112* 110  CO2 24 24  --  26 24 21*  GLUCOSE 125* 152* 154* 110* 105* 103*  BUN 25 28* 27* 25* 18 18  CREATININE 1.34* 1.48* 1.50* 1.19* 1.11* 1.04*  CALCIUM 9.3 8.7*  --  8.6* 8.9 8.4*    No results found for this or any previous visit (from the past 240 hour(s)).   Liver Function Tests:  Recent Labs Lab 06/11/17 1159 06/11/17 1539 06/12/17 0410  AST 16 23 16   ALT 24 28 22   ALKPHOS 88 75 64  BILITOT 0.5 1.0 1.2  PROT 6.6 7.0 6.2*  ALBUMIN 3.7 3.5 3.0*   No results for input(s): LIPASE, AMYLASE in the last 168 hours. No results for input(s): AMMONIA in the last 168 hours.  Cardiac Enzymes: No results for input(s): CKTOTAL, CKMB, CKMBINDEX, TROPONINI in the last 168 hours. BNP (last 3 results) No results for input(s): BNP in the last 8760 hours.  ProBNP (last 3 results)  Recent Labs  06/11/17 1200  PROBNP 574*      Studies: No results found.  Scheduled Meds: . amiodarone  200 mg Oral Daily  . metoprolol succinate  25 mg Oral Daily  . pantoprazole (PROTONIX) IV  40 mg Intravenous Q12H  . potassium chloride SA  20 mEq Oral BID      Time spent: 25 min  [facility] Pager [phone]. If 7PM-7AM, please contact night-coverage at [email or web address], Office  [phone]  password [place]  06/14/2017, 1:57 PM  LOS: 3 days

## 2017-06-14 NOTE — Progress Notes (Signed)
Subjective: No complaints.  Objective: Vital signs in last 24 hours: Temp:  [97.7 F (36.5 C)-98.9 F (37.2 C)] 98.2 F (36.8 C) (09/09 0550) Pulse Rate:  [56-71] 70 (09/09 0550) Resp:  [14-20] 18 (09/09 0550) BP: (98-151)/(43-94) 117/58 (09/09 0550) SpO2:  [94 %-99 %] 96 % (09/09 0550) Last BM Date: 06/13/17 (colon prep)  Intake/Output from previous day: 09/08 0701 - 09/09 0700 In: 800 [P.O.:400; I.V.:400] Out: -  Intake/Output this shift: Total I/O In: 400 [P.O.:400] Out: -   General appearance: alert and no distress GI: morbidly obese, soft, nontender  Lab Results:  Recent Labs  06/12/17 0410 06/12/17 1827 06/13/17 0522 06/14/17 0504  WBC 8.5  --  8.5 11.4*  HGB 5.7* 8.7* 8.0* 7.4*  HCT 20.6* 30.6* 28.0* 26.1*  PLT 337  --  371 313   BMET  Recent Labs  06/12/17 0410 06/13/17 0522 06/14/17 0504  NA 143 145 139  K 3.9 3.9 4.1  CL 109 112* 110  CO2 26 24 21*  GLUCOSE 110* 105* 103*  BUN 25* 18 18  CREATININE 1.19* 1.11* 1.04*  CALCIUM 8.6* 8.9 8.4*   LFT  Recent Labs  06/11/17 1539 06/12/17 0410  PROT 7.0 6.2*  ALBUMIN 3.5 3.0*  AST 23 16  ALT 28 22  ALKPHOS 75 64  BILITOT 1.0 1.2  BILIDIR <0.1*  --   IBILI NOT CALCULATED  --    PT/INR  Recent Labs  06/11/17 1539  LABPROT 23.6*  INR 2.13   Hepatitis Panel No results for input(s): HEPBSAG, HCVAB, HEPAIGM, HEPBIGM in the last 72 hours. C-Diff No results for input(s): CDIFFTOX in the last 72 hours. Fecal Lactopherrin No results for input(s): FECLLACTOFRN in the last 72 hours.  Studies/Results: No results found.  Medications:  Scheduled: . pantoprazole (PROTONIX) IV  40 mg Intravenous Q12H   Continuous: . sodium chloride 75 mL/hr at 06/13/17 0650    Assessment/Plan: 1) Ascending colon mass - likely colon cancer. 2) Anemia secondary to mass.   The patient is well today.  No complaints.  Her HGB has dropped down as expected with the mass and recent anticoagulation.   Biopsies pending, but CT scan was negative for any malignant disease.  She appears to be an early stay colon cancer.  Plan: 1) Await biopsy results. 2) Surgical consultation. 3) Follow HGB and transfuse as necessary. 4) Hold anticoagulation. 5) [facility] to resume care tomorrow AM.  LOS: 3 days   Micheal Sheen D 06/14/2017, 6:46 AM

## 2017-06-14 NOTE — Consult Note (Signed)
Reason for Consult: colon mass Referring Physician: Dr Benson Norway  Amber Medina is an 66 y.o. female.  HPI: 66 y.o. F who is anticoagulated for A fib.  She has been having increasing weakness and SOB.  She was noted to have a Hgb 5.4.  She was transfused in the ED and underwent colonoscopy on Sat.  This showed a non-obstructing ascending colon mass.  This was biopsied and tattooed.    Past Medical History:  Diagnosis Date  . Abnormal pulmonary function test 02/2017  . Chronic diastolic (congestive) heart failure ([place]) 10/11/2015  . CKD (chronic kidney disease), stage II   . Microcytic anemia    by labs 02/2017  . Morbid obesity ([place])   . Nocturnal hypoxemia   . Persistent atrial fibrillation ([place])   . Snoring   . Suspected sleep apnea     Past Surgical History:  Procedure Laterality Date  . TONSILLECTOMY      Family History  Problem Relation Age of Onset  . Diabetes Mellitus II Mother   . Hypertension Mother   . Congenital heart disease Mother   . Hypertension Father     Social History:  reports that she has quit smoking. She has never used smokeless tobacco. She reports that she drinks alcohol. She reports that she does not use drugs.  Allergies:  Allergies  Allergen Reactions  . Compazine [Prochlorperazine Edisylate] Swelling    Medications: I have reviewed the patient's current medications.  Results for orders placed or performed during the hospital encounter of 06/11/17 (from the past 48 hour(s))  Hemoglobin and hematocrit, blood     Status: Abnormal   Collection Time: 06/12/17  6:27 PM  Result Value Ref Range   Hemoglobin 8.7 (L) 12.0 - 15.0 g/dL    Comment: DELTA CHECK NOTED REPEATED TO VERIFY    HCT 30.6 (L) 36.0 - 46.0 %  CBC     Status: Abnormal   Collection Time: 06/13/17  5:22 AM  Result Value Ref Range   WBC 8.5 4.0 - 10.5 K/uL   RBC 3.82 (L) 3.87 - 5.11 MIL/uL   Hemoglobin 8.0 (L) 12.0 - 15.0 g/dL   HCT 28.0 (L) 36.0 - 46.0 %   MCV 73.3 (L) 78.0 - 100.0 fL     Comment: RESULT REPEATED AND VERIFIED DELTA CHECK NOTED    MCH 20.9 (L) 26.0 - 34.0 pg   MCHC 28.6 (L) 30.0 - 36.0 g/dL   RDW 21.6 (H) 11.5 - 15.5 %   Platelets 371 150 - 400 K/uL  Basic metabolic panel     Status: Abnormal   Collection Time: 06/13/17  5:22 AM  Result Value Ref Range   Sodium 145 135 - 145 mmol/L   Potassium 3.9 3.5 - 5.1 mmol/L   Chloride 112 (H) 101 - 111 mmol/L   CO2 24 22 - 32 mmol/L   Glucose, Bld 105 (H) 65 - 99 mg/dL   BUN 18 6 - 20 mg/dL   Creatinine, Ser 1.11 (H) 0.44 - 1.00 mg/dL   Calcium 8.9 8.9 - 10.3 mg/dL   GFR calc non Af Amer 50 (L) >60 mL/min   GFR calc Af Amer 58 (L) >60 mL/min    Comment: (NOTE) The eGFR has been calculated using the CKD EPI equation. This calculation has not been validated in all clinical situations. eGFR's persistently <60 mL/min signify possible Chronic Kidney Disease.    Anion gap 9 5 - 15  CBC     Status: Abnormal  Collection Time: 06/14/17  5:04 AM  Result Value Ref Range   WBC 11.4 (H) 4.0 - 10.5 K/uL   RBC 3.49 (L) 3.87 - 5.11 MIL/uL   Hemoglobin 7.4 (L) 12.0 - 15.0 g/dL   HCT 26.1 (L) 36.0 - 46.0 %   MCV 74.8 (L) 78.0 - 100.0 fL   MCH 21.2 (L) 26.0 - 34.0 pg   MCHC 28.4 (L) 30.0 - 36.0 g/dL   RDW 22.8 (H) 11.5 - 15.5 %   Platelets 313 150 - 400 K/uL  Basic metabolic panel     Status: Abnormal   Collection Time: 06/14/17  5:04 AM  Result Value Ref Range   Sodium 139 135 - 145 mmol/L   Potassium 4.1 3.5 - 5.1 mmol/L   Chloride 110 101 - 111 mmol/L   CO2 21 (L) 22 - 32 mmol/L   Glucose, Bld 103 (H) 65 - 99 mg/dL   BUN 18 6 - 20 mg/dL   Creatinine, Ser 1.04 (H) 0.44 - 1.00 mg/dL   Calcium 8.4 (L) 8.9 - 10.3 mg/dL   GFR calc non Af Amer 54 (L) >60 mL/min   GFR calc Af Amer >60 >60 mL/min    Comment: (NOTE) The eGFR has been calculated using the CKD EPI equation. This calculation has not been validated in all clinical situations. eGFR's persistently <60 mL/min signify possible Chronic  Kidney Disease.    Anion gap 8 5 - 15    No results found.  Review of Systems  Constitutional: Positive for malaise/fatigue. Negative for chills and fever.  HENT: Negative for hearing loss.   Eyes: Negative for blurred vision.  Respiratory: Negative for cough and shortness of breath.   Cardiovascular: Negative for chest pain and palpitations.  Gastrointestinal: Negative for abdominal pain, nausea and vomiting.  Genitourinary: Negative for dysuria and urgency.  Musculoskeletal: Negative for myalgias.  Skin: Negative for itching and rash.  Neurological: Positive for weakness. Negative for dizziness and headaches.   Blood pressure (!) 117/58, pulse 70, temperature 98.2 F (36.8 C), temperature source Oral, resp. rate 18, height 5' 6"  (1.676 m), weight (!) 147.5 kg (325 lb 2.9 oz), SpO2 96 %. Physical Exam  Constitutional: She is oriented to person, place, and time. She appears well-developed and well-nourished.  HENT:  Head: Normocephalic and atraumatic.  Eyes: Pupils are equal, round, and reactive to light. Conjunctivae and EOM are normal.  Neck: Normal range of motion. Neck supple.  Cardiovascular: Normal rate and regular rhythm.   Respiratory: Effort normal. No respiratory distress.  GI: Soft. She exhibits no distension. There is no tenderness.  Neurological: She is alert and oriented to person, place, and time.  Skin: Skin is warm and dry.    Assessment/Plan: Pt with ascending colon mass in the face of severe anemia.  Will await path results.  If positive for cancer, will need CEA and CT chest for metastatic workup.  Will also need cardiac clearance prior to surgery.  If Hgb remains stable, could d/c on iron supplements and f/u in the office for elective surgery.  If more urgent surgery is needed, we will be available.    Keiara Sneeringer C. 10/09/4816, 56:31 AM

## 2017-06-15 ENCOUNTER — Encounter (HOSPITAL_COMMUNITY): Payer: Self-pay | Admitting: Gastroenterology

## 2017-06-15 DIAGNOSIS — K639 Disease of intestine, unspecified: Secondary | ICD-10-CM

## 2017-06-15 DIAGNOSIS — D500 Iron deficiency anemia secondary to blood loss (chronic): Secondary | ICD-10-CM

## 2017-06-15 DIAGNOSIS — K6389 Other specified diseases of intestine: Secondary | ICD-10-CM

## 2017-06-15 LAB — CBC
HCT: 29.6 % — ABNORMAL LOW (ref 36.0–46.0)
Hemoglobin: 8.5 g/dL — ABNORMAL LOW (ref 12.0–15.0)
MCH: 21.4 pg — AB (ref 26.0–34.0)
MCHC: 28.7 g/dL — ABNORMAL LOW (ref 30.0–36.0)
MCV: 74.4 fL — AB (ref 78.0–100.0)
PLATELETS: 328 10*3/uL (ref 150–400)
RBC: 3.98 MIL/uL (ref 3.87–5.11)
RDW: 23.1 % — AB (ref 11.5–15.5)
WBC: 10.5 10*3/uL (ref 4.0–10.5)

## 2017-06-15 LAB — BPAM RBC
BLOOD PRODUCT EXPIRATION DATE: 201809222359
BLOOD PRODUCT EXPIRATION DATE: 201809262359
BLOOD PRODUCT EXPIRATION DATE: 201809272359
BLOOD PRODUCT EXPIRATION DATE: 201809302359
ISSUE DATE / TIME: 201809071027
ISSUE DATE / TIME: 201809091352
ISSUE DATE / TIME: 201809062002
ISSUE DATE / TIME: 201809071403
UNIT TYPE AND RH: 9500
Unit Type and Rh: 9500
Unit Type and Rh: 9500
Unit Type and Rh: 9500

## 2017-06-15 LAB — TYPE AND SCREEN
ABO/RH(D): O NEG
ANTIBODY SCREEN: NEGATIVE
UNIT DIVISION: 0
UNIT DIVISION: 0
UNIT DIVISION: 0
UNIT DIVISION: 0

## 2017-06-15 LAB — COMPREHENSIVE METABOLIC PANEL
ALT: 20 U/L (ref 14–54)
AST: 17 U/L (ref 15–41)
Albumin: 3.5 g/dL (ref 3.5–5.0)
Alkaline Phosphatase: 68 U/L (ref 38–126)
Anion gap: 10 (ref 5–15)
BUN: 15 mg/dL (ref 6–20)
CHLORIDE: 107 mmol/L (ref 101–111)
CO2: 22 mmol/L (ref 22–32)
Calcium: 8.9 mg/dL (ref 8.9–10.3)
Creatinine, Ser: 1.14 mg/dL — ABNORMAL HIGH (ref 0.44–1.00)
GFR calc Af Amer: 56 mL/min — ABNORMAL LOW (ref >60)
GFR, EST NON AFRICAN AMERICAN: 48 mL/min — AB (ref >60)
Glucose, Bld: 100 mg/dL — ABNORMAL HIGH (ref 65–99)
Potassium: 4.7 mmol/L (ref 3.5–5.1)
SODIUM: 139 mmol/L (ref 135–145)
Total Bilirubin: 0.7 mg/dL (ref 0.3–1.2)
Total Protein: 6.8 g/dL (ref 6.5–8.1)

## 2017-06-15 LAB — CEA: CEA: 2.8 ng/mL (ref 0.0–4.7)

## 2017-06-15 NOTE — Progress Notes (Signed)
Pt plan to discharge home with no needs at present time.

## 2017-06-15 NOTE — Progress Notes (Signed)
[facility]  PROGRESS NOTE  [name] ITG:[id number] DOB: Jan 02, 1951 DOA: 06/11/2017 PCP: Patient, No Pcp Per   Brief HPI:    66 y.o. female, With history of atrial fibrillation on anticoagulation with Xarelto, chronic diastolic CHF, hypertension, chronic kidney disease stage II, morbid obesity who was sent from cardiology office for further evaluation of anemia. According to patient she has been having worsening shortness of breath over past several months since May. And she has gradually worsened. Today she felt worse and was not able to walk more than 10 feet without having to rest. She went to her cardiologist office. Lab work revealed severe anemia with hemoglobin of 5.4    Subjective   Patient status post colonoscopy which showed nonobstructing mass in ascending colon. Biopsy is currently pending.  Patient received 1 units PRBC yesterday, 1 dose of IV iron this morning. Hemoglobin this morning is 8.5.   Assessment/Plan:     1. Anemia- likely from lower GI diverticular bleed as CT scan shows sigmoid diverticulosis. Patient is status post 4 units PRBC. Hemoglobin this morning is 8.5 this morning.  2. Mass in ascending colon- patient stool was guaiac-positive, underwent colonoscopy which showed nonobstructing mass in ascending colon. Mass was biopsied and the results are pending. Xarelto currently on hold 3. Atrial fibrillation-heart rate is controlled, Cardizem, amiodarone and metoprolol were held as patient was nothing by mouth,. Patient was started on metoprolol 2.5 mg IV every 6 hours, which was changed to 2.5 mg IV every 6 hours when necessary  due to bradycardia. Anticoagulation is currently on hold. Restarted amiodarone, Toprol-XL. Will hold Cardizem at this time as patient blood pressure is soft. 4. Acute kidney injury-patient came with creatinine from 1.50, improved with IV fluids today creatinine is 1.14.  5. Hypertension-blood pressure is stable 6. History of diastolic  CHF-currently well compensated. Lasix was held initially, patient has swelling in lower extremities. We'll give 1 dose of Lasix 20 mg IV. Follow BMP and EM.    DVT prophylaxis: SCDs  Code Status: Full code  Family Communication: No family at bedside   Disposition Plan: Home after EGD and colonoscopy   Consultants:  Gastroenterology  Procedures:  None  Continuous infusions     Antibiotics:   Anti-infectives    None       Objective   Vitals:   06/14/17 2132 06/15/17 0508 06/15/17 0846 06/15/17 0940  BP: 138/63 121/60 (!) 144/65 122/70  Pulse: 69 64 (!) 102 61  Resp: 18 18 18 18   Temp: 98.5 F (36.9 C) 98.2 F (36.8 C) 98 F (36.7 C)   TempSrc: Oral Oral Oral   SpO2: 96% 95% 98% 95%  Weight:      Height:        Intake/Output Summary (Last 24 hours) at 06/15/17 1352 Last data filed at 06/15/17 0200  Gross per 24 hour  Intake             1008 ml  Output                0 ml  Net             1008 ml   Filed Weights   06/11/17 1511 06/11/17 1850  Weight: (!) 145.2 kg (320 lb) (!) 147.5 kg (325 lb 2.9 oz)     Physical Examination:  Physical Exam: Eyes: No icterus, extraocular muscles intact  Mouth: Oral mucosa is moist, no lesions on palate,  Neck: Supple, no deformities, masses, or tenderness Lungs:  Normal respiratory effort, bilateral clear to auscultation, no crackles or wheezes.  Heart: Regular rate and rhythm, S1 and S2 normal, no murmurs, rubs auscultated Abdomen: BS normoactive,soft,nondistended,non-tender to palpation,no organomegaly Extremities: Bilateral trace edema, no erythema, no cyanosis, no clubbing Neuro : Alert and oriented to time, place and person, No focal deficits Skin: No rashes seen on exam     Data Reviewed: I have personally reviewed following labs and imaging studies  CBG: No results for input(s): GLUCAP in the last 168 hours.  CBC:  Recent Labs Lab 06/11/17 1539  06/12/17 0410 06/12/17 1827 06/13/17 0522  06/14/17 0504 06/15/17 0430  WBC 9.6  --  8.5  --  8.5 11.4* 10.5  HGB 5.6*  < > 5.7* 8.7* 8.0* 7.4* 8.5*  HCT 20.7*  < > 20.6* 30.6* 28.0* 26.1* 29.6*  MCV 67.2*  --  68.0*  --  73.3* 74.8* 74.4*  PLT 451*  --  337  --  371 313 328  < > = values in this interval not displayed.  Basic Metabolic Panel:  Recent Labs Lab 06/11/17 1539 06/11/17 1554 06/12/17 0410 06/13/17 0522 06/14/17 0504 06/15/17 0430  NA 138 141 143 145 139 139  K 4.2 4.3 3.9 3.9 4.1 4.7  CL 101 105 109 112* 110 107  CO2 24  --  26 24 21* 22  GLUCOSE 152* 154* 110* 105* 103* 100*  BUN 28* 27* 25* 18 18 15   CREATININE 1.48* 1.50* 1.19* 1.11* 1.04* 1.14*  CALCIUM 8.7*  --  8.6* 8.9 8.4* 8.9    No results found for this or any previous visit (from the past 240 hour(s)).   Liver Function Tests:  Recent Labs Lab 06/11/17 1159 06/11/17 1539 06/12/17 0410 06/15/17 0430  AST 16 23 16 17   ALT 24 28 22 20   ALKPHOS 88 75 64 68  BILITOT 0.5 1.0 1.2 0.7  PROT 6.6 7.0 6.2* 6.8  ALBUMIN 3.7 3.5 3.0* 3.5   No results for input(s): LIPASE, AMYLASE in the last 168 hours. No results for input(s): AMMONIA in the last 168 hours.  Cardiac Enzymes: No results for input(s): CKTOTAL, CKMB, CKMBINDEX, TROPONINI in the last 168 hours. BNP (last 3 results) No results for input(s): BNP in the last 8760 hours.  ProBNP (last 3 results)  Recent Labs  06/11/17 1200  PROBNP 574*      Studies: No results found.  Scheduled Meds: . amiodarone  200 mg Oral Daily  . metoprolol succinate  25 mg Oral Daily  . pantoprazole (PROTONIX) IV  40 mg Intravenous Q12H  . potassium chloride SA  20 mEq Oral BID      Time spent: 25 min  [facility] Pager [phone]. If 7PM-7AM, please contact night-coverage at [email or web address], Office  [phone]  password [email or web address]  06/15/2017, 1:52 PM  LOS: 4 days

## 2017-06-15 NOTE — Progress Notes (Signed)
VS stable Iron infusion given as ordered. SRP,RN

## 2017-06-15 NOTE — Progress Notes (Signed)
    Progress Note   Subjective  Chief Complaint: Anemia, Colon Mass   Today, the patient is found laying comfortably in bed. She denies any complaints. She does ask questions regarding why her possible surgery is "elective". She would like to get this "thing out" as soon as possible if it is cancer. She denies any new complaints or concerns.   Objective   Vital signs in last 24 hours: Temp:  [97.8 F (36.6 C)-98.5 F (36.9 C)] 98 F (36.7 C) (09/10 0846) Pulse Rate:  [64-102] 102 (09/10 0846) Resp:  [16-18] 18 (09/10 0846) BP: (121-155)/(54-72) 144/65 (09/10 0846) SpO2:  [94 %-98 %] 98 % (09/10 0846) Last BM Date: 06/13/17 General:    Obese Caucasian female in NAD Heart:  Regular rate and rhythm; no murmurs Lungs: Respirations even and unlabored, lungs CTA bilaterally Abdomen:  Soft, nontender and nondistended. Normal bowel sounds. Extremities:  Without edema. Neurologic:  Alert and oriented,  grossly normal neurologically. Psych:  Cooperative. Normal mood and affect.  Intake/Output from previous day: 09/09 0701 - 09/10 0700 In: 1248 [P.O.:880; Blood:368] Out: -   Lab Results:  Recent Labs  06/13/17 0522 06/14/17 0504 06/15/17 0430  WBC 8.5 11.4* 10.5  HGB 8.0* 7.4* 8.5*  HCT 28.0* 26.1* 29.6*  PLT 371 313 328   BMET  Recent Labs  06/13/17 0522 06/14/17 0504 06/15/17 0430  NA 145 139 139  K 3.9 4.1 4.7  CL 112* 110 107  CO2 24 21* 22  GLUCOSE 105* 103* 100*  BUN 18 18 15   CREATININE 1.11* 1.04* 1.14*  CALCIUM 8.9 8.4* 8.9   LFT  Recent Labs  06/15/17 0430  PROT 6.8  ALBUMIN 3.5  AST 17  ALT 20  ALKPHOS 68  BILITOT 0.7     Assessment / Plan:   Assessment: 1. Anemia suspected secondary to colon mass in the ascending colon: Hemoglobin currently stable, patient s/p iron infusion earlier today, surgery has been consulted, appreciate their recommendations  Plan: 1. Continue to await pathology 2. Continue to monitor hgb with transfusion as  needed >7 3. Please await any further recs from Dr. Loletha Carrow later today  Thank you for your kind consultation.    LOS: 4 days   Levin Erp  06/15/2017, 9:39 AM  Pager # [phone]  I have discussed the case with the PA, and that is the plan I formulated. I personally interviewed and examined the patient.  Surgical consult obtained, preop evaluation underway, no overt GI bleeding.  No further testing or treatment from GI service at this time.  We will sign off - call as need arises.    Nelida Meuse III Pager [phone]  Mon-Fri 8a-5p [phone] after 5p, weekends, holidays

## 2017-06-16 LAB — CBC
HEMATOCRIT: 30.8 % — AB (ref 36.0–46.0)
HEMOGLOBIN: 8.6 g/dL — AB (ref 12.0–15.0)
MCH: 21.6 pg — ABNORMAL LOW (ref 26.0–34.0)
MCHC: 27.9 g/dL — ABNORMAL LOW (ref 30.0–36.0)
MCV: 77.2 fL — AB (ref 78.0–100.0)
Platelets: 283 10*3/uL (ref 150–400)
RBC: 3.99 MIL/uL (ref 3.87–5.11)
RDW: 24.4 % — AB (ref 11.5–15.5)
WBC: 10.1 10*3/uL (ref 4.0–10.5)

## 2017-06-16 MED ORDER — FUROSEMIDE 40 MG PO TABS
40.0000 mg | ORAL_TABLET | Freq: Every day | ORAL | Status: DC
  Administered 2017-06-16: 40 mg via ORAL
  Filled 2017-06-16: qty 1

## 2017-06-16 MED ORDER — FERROUS GLUCONATE 324 (38 FE) MG PO TABS: 324.0000 mg | ORAL_TABLET | Freq: Two times a day (BID) | ORAL | 3 refills | Status: DC

## 2017-06-16 MED ORDER — FERROUS GLUCONATE 324 (38 FE) MG PO TABS: 324.0000 mg | ORAL_TABLET | Freq: Three times a day (TID) | ORAL | 3 refills | Status: DC

## 2017-06-16 MED ORDER — FUROSEMIDE 40 MG PO TABS: 40.0000 mg | ORAL_TABLET | Freq: Two times a day (BID) | ORAL | Status: DC

## 2017-06-16 NOTE — Care Management Important Message (Signed)
Important Message  Patient Details  Name: Amber Medina MRN: [id number] Date of Birth: Aug 02, 1951   Medicare Important Message Given:  Yes    Kerin Salen 06/16/2017, 11:01 AMImportant Message  Patient Details  Name: Amber Medina MRN: [id number] Date of Birth: Jan 10, 1951   Medicare Important Message Given:  Yes    Kerin Salen 06/16/2017, 11:01 AM

## 2017-06-16 NOTE — Progress Notes (Signed)
[facility] Progress Note  3 Days Post-Op  Subjective: CC:  Denies abdominal pain, dizziness, or near-syncope. Just ambulated independently with therapies. Tolerating PO. Reports bowel movements have never been frankly bloody or black, at home they appear "normal" with an occasional streak of blood if she has been constipated.   Lives in [place] as well as [place] and plans to seek medical and surgical treatment for mass/anemia in [place], as she has more support from her friend/neighbor here. Additionally she lives on a third flood town home in [place] and feels that will be harder on her at this time.   Objective: Vital signs in last 24 hours: Temp:  [98.2 F (36.8 C)-98.7 F (37.1 C)] 98.7 F (37.1 C) (09/11 0645) Pulse Rate:  [60-65] 63 (09/11 0911) Resp:  [18] 18 (09/11 0645) BP: (130-143)/(59-83) 142/59 (09/11 0911) SpO2:  [95 %-99 %] 97 % (09/11 0645) Last BM Date: 06/15/17  Intake/Output from previous day: 09/10 0701 - 09/11 0700 In: 880 [P.O.:880] Out: 0  Intake/Output this shift: No intake/output data recorded.  PE: Gen:  Alert, NAD, pleasant Card:  Regular rate and rhythm, pedal pulses 2+ BL Pulm:  Normal effort, clear to auscultation bilaterally Abd: Soft, non-tender, non-distended, bowel sounds present in all 4 quadrants Skin: warm and dry, no rashes  Psych: A&Ox3   Lab Results:   Recent Labs  06/15/17 0430 06/16/17 0413  WBC 10.5 10.1  HGB 8.5* 8.6*  HCT 29.6* 30.8*  PLT 328 283   BMET  Recent Labs  06/14/17 0504 06/15/17 0430  NA 139 139  K 4.1 4.7  CL 110 107  CO2 21* 22  GLUCOSE 103* 100*  BUN 18 15  CREATININE 1.04* 1.14*  CALCIUM 8.4* 8.9   PT/INR No results for input(s): LABPROT, INR in the last 72 hours. CMP     Component Value Date/Time   NA 139 06/15/2017 0430   NA 140 06/11/2017 1159   K 4.7 06/15/2017 0430   CL 107 06/15/2017 0430   CO2 22 06/15/2017 0430   GLUCOSE 100 (H) 06/15/2017 0430   BUN 15  06/15/2017 0430   BUN 25 06/11/2017 1159   CREATININE 1.14 (H) 06/15/2017 0430   CREATININE 1.55 (H) 02/05/2016 1220   CALCIUM 8.9 06/15/2017 0430   PROT 6.8 06/15/2017 0430   PROT 6.6 06/11/2017 1159   ALBUMIN 3.5 06/15/2017 0430   ALBUMIN 3.7 06/11/2017 1159   AST 17 06/15/2017 0430   ALT 20 06/15/2017 0430   ALKPHOS 68 06/15/2017 0430   BILITOT 0.7 06/15/2017 0430   BILITOT 0.5 06/11/2017 1159   GFRNONAA 48 (L) 06/15/2017 0430   GFRAA 56 (L) 06/15/2017 0430   Lipase  No results found for: LIPASE     Studies/Results: No results found.  Anti-infectives: Anti-infectives    None     Assessment/Plan Lower GIB - hgb stable.Continue avoid constipation and straining. Take stool softeners and laxatives as needed. Hold anticoagulation. Iron therapy per medical service. Right colon mass - surgical path pending. Patient should call our office Monday 9/17 to follow up path results. She is scheduled for follow up with Dr Marcello Moores to discuss surgical resection of mass. If path positive for cancer we may move appointment up to an earlier date. Will need follow up with cardiology outpatient for surgical optimization and pre-operative clearance.   Stable for discharge home from a surgical perspective with outpatient follow up.   LOS: 5 days    Jill Alexanders , PA-C [facility] 06/16/2017, 12:33 PM Pager: [phone] Consults: [phone] Mon-Fri 7:00 am-4:30 pm Sat-Sun 7:00 am-11:30 am

## 2017-06-16 NOTE — Discharge Summary (Signed)
Physician Discharge Summary  Amber Medina GUY:[id number] DOB: 1951-03-07 DOA: 06/11/2017  PCP: Patient, No Pcp Per  Admit date: 06/11/2017 Discharge date: 06/16/2017  Time spent: 35 minutes  Recommendations for Outpatient Follow-up:  1. Follow-up Gen. surgery 1 week   Discharge Diagnoses:  Active Problems:   Anemia   Colonic mass   Discharge Condition: Stable  Diet recommendation: Regular diet  Filed Weights   06/11/17 1511 06/11/17 1850  Weight: (!) 145.2 kg (320 lb) (!) 147.5 kg (325 lb 2.9 oz)    History of present illness:  66 y.o.female,With history of atrial fibrillation on anticoagulation with Xarelto, chronic diastolic CHF, hypertension, chronic kidney disease stage II, morbid obesity who was sent from cardiology office for further evaluation of anemia. According to patient she has been having worsening shortness of breath over past several months since May. And she has gradually worsened. Today she felt worse and was not able to walk more than 10 feet without having to rest. She went to her cardiologist office. Lab work revealed severe anemia with hemoglobin of 5.4   Hospital Course:  1. Anemia- likely from lower GI diverticular bleed as CT scan shows sigmoid diverticulosis. Patient is status post 4 units PRBC. Hemoglobin this morning is 8.6. 2. Mass in ascending colon- patient stool was guaiac-positive, underwent colonoscopy which showed nonobstructing mass in ascending colon. Mass was biopsied and the results are pending. Xarelto currently on hold. Patient will be discharged home to follow-up with general surgery in 1 week to discuss pathology report from the biopsy and elective surgery as outpatient. CEA 2.8 3. Atrial fibrillation-heart rate is controlled, Cardizem, amiodarone and metoprolol were held as patient was nothing by mouth,. Patient was started on metoprolol 2.5 mg IV every 6 hours, which was changed to 2.5 mg IV every 6 hours when necessary  due to bradycardia.  Anticoagulation is currently on hold. Restart amiodarone, metoprolol, Cardizem. We'll discontinue Xarelto due to high risk of bleeding and anticipation for surgery soon. Xarelto can be restarted after surgery. 4. Acute kidney injury-patient came with creatinine from 1.50, improved with IV fluids  creatinine is 1.14.  5. Hypertension-blood pressure is stable 6. History of diastolic CHF-mild fluid overload, not requiring oxygen, will restart home dose of Lasix 40 mg by mouth daily.  Proce dures:  EGD    colonoscopy  Consult General surgery    Discharge Exam: Vitals:   06/16/17 0645 06/16/17 0911  BP: (!) 143/65 (!) 142/59  Pulse: 64 63  Resp: 18   Temp: 98.7 F (37.1 C)   SpO2: 97%     GenerAppears in no acute distress Cardiovascular: S1-S2, regular  Respiratory- Clear to auscultation bilaterally Abdomen- soft, nontender, no organomegaly.  Discharge Instructions   Discharge Instructions    Diet - low sodium heart healthy    Complete by:  As directed    Increase activity slowly    Complete by:  As directed      Current Discharge Medication List    CONTINUE these medications which have NOT CHANGED   Details  acetaminophen (TYLENOL) 500 MG tablet Take 1,000 mg by mouth every 8 (eight) hours as needed for mild pain or moderate pain.    amiodarone (PACERONE) 200 MG tablet Take 1 tablet (200 mg total) by mouth daily. Qty: 90 tablet, Refills: 0    diltiazem (CARDIZEM CD) 240 MG 24 hr capsule Take 1 capsule (240 mg total) by mouth daily. Qty: 90 capsule, Refills: 3    furosemide (LASIX) 40 MG tablet  Take 1 tablet (40 mg total) by mouth daily. Qty: 90 tablet, Refills: 3    metoprolol succinate (TOPROL-XL) 25 MG 24 hr tablet take 1 tablet by mouth once daily Qty: 90 tablet, Refills: 3    potassium chloride SA (K-DUR,KLOR-CON) 20 MEQ tablet Take 1 tablet (20 mEq total) by mouth 2 (two) times daily. Qty: 180 tablet, Refills: 0      STOP taking these medications      XARELTO 20 MG TABS tablet        Allergies  Allergen Reactions  . Compazine [Prochlorperazine Edisylate] Swelling   Follow-up Information    Leighton Ruff, MD. Go on 54/03/2702.   Specialty:  General Surgery Why:  at 11:20 AM for follow up from recent hospitalization to discuss scheduling surgery to remove colonic mass. please arrive 30 minutes early. Contact information: [address] [place] [address] [phone]        [facility] Follow up on 07/10/2017.   Specialty:  Internal Medicine Why:  appointment at 1:00 PM on 07/10/17. Please keep appointment.  Contact information: [place] [phone]           The results of significant diagnostics from this hospitalization (including imaging, microbiology, ancillary and laboratory) are listed below for reference.    Significant Diagnostic Studies: Ct Abdomen Pelvis W Contrast  Result Date: 06/11/2017 CLINICAL DATA:  Weakness, iron deficiency anemia EXAM: CT ABDOMEN AND PELVIS WITH CONTRAST TECHNIQUE: Multidetector CT imaging of the abdomen and pelvis was performed using the standard protocol following bolus administration of intravenous contrast. CONTRAST:  124mL ISOVUE-300 IOPAMIDOL (ISOVUE-300) INJECTION 61% COMPARISON:  None. FINDINGS: Lower chest: Lung bases demonstrate no acute consolidation or pleural effusion. Heart size within normal limits. Hepatobiliary: Calcified gallstones. No biliary dilatation or focal hepatic abnormality Pancreas: Unremarkable. No pancreatic ductal dilatation or surrounding inflammatory changes. Spleen: Normal in size without focal abnormality. Adrenals/Urinary Tract: Stable nodular thickening of the adrenal glands. Subcentimeter hypodense lesions in the kidneys too small to further characterize. Negative for hydronephrosis. The bladder is unremarkable. Stomach/Bowel: Stomach is within normal limits. Appendix appears normal.  No evidence of bowel wall thickening, distention, or inflammatory changes. Mild diverticular disease of the colon without acute inflammation Vascular/Lymphatic: Aortic atherosclerosis. No enlarged abdominal or pelvic lymph nodes. Reproductive: No adnexal mass. Possible small right fundal exophytic fibroid. Prominent endometrial stripe for age, measuring up to 9 mm on sagittal views. Prominent hypodensity at the cervix. Other: Negative for free air or free fluid. Musculoskeletal: Degenerative changes of the spine. No acute or suspicious bone lesion IMPRESSION: 1. No CT evidence for acute intra-abdominal or pelvic pathology 2. Gallstones 3. Sigmoid colon diverticular disease without acute inflammation 4. Suspected endometrial thickening, this may be correlated with a pelvic ultrasound. Electronically Signed   By: Donavan Foil M.D.   On: 06/11/2017 16:42    Microbiology: No results found for this or any previous visit (from the past 240 hour(s)).   Labs: Basic Metabolic Panel:  Recent Labs Lab 06/11/17 1539 06/11/17 1554 06/12/17 0410 06/13/17 0522 06/14/17 0504 06/15/17 0430  NA 138 141 143 145 139 139  K 4.2 4.3 3.9 3.9 4.1 4.7  CL 101 105 109 112* 110 107  CO2 24  --  26 24 21* 22  GLUCOSE 152* 154* 110* 105* 103* 100*  BUN 28* 27* 25* 18 18 15   CREATININE 1.48* 1.50* 1.19* 1.11* 1.04* 1.14*  CALCIUM 8.7*  --  8.6* 8.9 8.4* 8.9   Liver Function Tests:  Recent Labs Lab 06/11/17 1159 06/11/17 1539 06/12/17 0410 06/15/17 0430  AST 16 23 16 17   ALT 24 28 22 20   ALKPHOS 88 75 64 68  BILITOT 0.5 1.0 1.2 0.7  PROT 6.6 7.0 6.2* 6.8  ALBUMIN 3.7 3.5 3.0* 3.5   No results for input(s): LIPASE, AMYLASE in the last 168 hours. No results for input(s): AMMONIA in the last 168 hours. CBC:  Recent Labs Lab 06/12/17 0410 06/12/17 1827 06/13/17 0522 06/14/17 0504 06/15/17 0430 06/16/17 0413  WBC 8.5  --  8.5 11.4* 10.5 10.1  HGB 5.7* 8.7* 8.0* 7.4* 8.5* 8.6*  HCT 20.6* 30.6*  28.0* 26.1* 29.6* 30.8*  MCV 68.0*  --  73.3* 74.8* 74.4* 77.2*  PLT 337  --  371 313 328 283   Cardiac Enzymes: No results for input(s): CKTOTAL, CKMB, CKMBINDEX, TROPONINI in the last 168 hours. BNP: BNP (last 3 results) No results for input(s): BNP in the last 8760 hours.  ProBNP (last 3 results)  Recent Labs  06/11/17 1200  PROBNP 574*    CBG: No results for input(s): GLUCAP in the last 168 hours.     SignedOswald Hillock MD.  [facility] 06/16/2017, 1:02 PM

## 2017-06-16 NOTE — Evaluation (Addendum)
Physical Therapy Evaluation Patient Details Name: Amber Medina MRN: [id number] DOB: 1951-06-04 Today's Date: 06/16/2017   History of Present Illness  66 yo female admitted with anemia. Imaging (+) colon mass. Hx of A fib, CHF, CKD, morbid obesity, anemia  Clinical Impression  On eval, pt was supervision level for mobility. She walked ~50 feet in the hallway without an assistive device. Ambulation distance limited by dyspnea- dyspnea 3-4/4. Seated rest break required for recovery before ambulating back to room. O2 sat 90% or > on RA. Will follow during hospital stay. Surgical consult pending.     Follow Up Recommendations No PT follow up    Equipment Recommendations  None recommended by PT    Recommendations for Other Services       Precautions / Restrictions Precautions Precaution Comments: dyspnea with activity Restrictions Weight Bearing Restrictions: No      Mobility  Bed Mobility               General bed mobility comments: oob in recliner  Transfers Overall transfer level: Modified independent                  Ambulation/Gait Ambulation/Gait assistance: Supervision Ambulation Distance (Feet): 50 Feet (x2) Assistive device: None Gait Pattern/deviations: Step-through pattern;Decreased stride length     General Gait Details: distance limited by dyspnea. O2 sat >90% on RA. Seated rest break taken/needed. Cues for pursed lip/deep breathing  Stairs            Wheelchair Mobility    Modified Rankin (Stroke Patients Only)       Balance                                             Pertinent Vitals/Pain Pain Assessment: No/denies pain    Home Living Family/patient expects to be discharged to:: Private residence Living Arrangements: Alone   Type of Home: House Home Access: Stairs to enter Entrance Stairs-Rails: Psychiatric nurse of Steps: 4-5 Home Layout: One level Home Equipment: Environmental consultant - 4 wheels       Prior Function Level of Independence: Independent with assistive device(s)         Comments: using rollator most recently due to dyspnea with activity     Hand Dominance        Extremity/Trunk Assessment   Upper Extremity Assessment Upper Extremity Assessment: Overall WFL for tasks assessed    Lower Extremity Assessment Lower Extremity Assessment: Overall WFL for tasks assessed    Cervical / Trunk Assessment Cervical / Trunk Assessment: Normal  Communication   Communication: No difficulties  Cognition Arousal/Alertness: Awake/alert Behavior During Therapy: WFL for tasks assessed/performed Overall Cognitive Status: Within Functional Limits for tasks assessed                                        General Comments      Exercises     Assessment/Plan    PT Assessment Patient needs continued PT services  PT Problem List Decreased activity tolerance       PT Treatment Interventions Gait training;Functional mobility training;Patient/family education;DME instruction;Therapeutic activities;Therapeutic exercise    PT Goals (Current goals can be found in the Care Plan section)  Acute Rehab PT Goals Patient Stated Goal: to be less dyspneic with activity PT Goal Formulation:  With patient Time For Goal Achievement: 06/30/17 Potential to Achieve Goals: Good    Frequency Min 3X/week   Barriers to discharge        Co-evaluation               AM-PAC PT "6 Clicks" Daily Activity  Outcome Measure Difficulty turning over in bed (including adjusting bedclothes, sheets and blankets)?: None Difficulty moving from lying on back to sitting on the side of the bed? : None Difficulty sitting down on and standing up from a chair with arms (e.g., wheelchair, bedside commode, etc,.)?: None Help needed moving to and from a bed to chair (including a wheelchair)?: None Help needed walking in hospital room?: A Little Help needed climbing 3-5 steps with a  railing? : A Little 6 Click Score: 22    End of Session   Activity Tolerance: Patient limited by fatigue (limited by dyspnea) Patient left: in chair;with call bell/phone within reach   PT Visit Diagnosis: Difficulty in walking, not elsewhere classified (R26.2)    Time: 7096-4383 PT Time Calculation (min) (ACUTE ONLY): 9 min   Charges:   PT Evaluation $PT Eval Low Complexity: 1 Low     PT G Codes:          Weston Anna, MPT Pager: [phone]

## 2017-06-16 NOTE — Plan of Care (Signed)
Problem: Health Behavior/Discharge Planning: Goal: Ability to manage health-related needs will improve Outcome: Progressing .  Problem: Physical Regulation: Goal: Ability to maintain clinical measurements within normal limits will improve Outcome: Progressing .  Problem: Tissue Perfusion: Goal: Risk factors for ineffective tissue perfusion will decrease Outcome: Progressing .  Problem: Fluid Volume: Goal: Ability to maintain a balanced intake and output will improve Outcome: Progressing .

## 2017-06-16 NOTE — Progress Notes (Signed)
Pt refusing ambulation study.  Did not qualify for O2 with PT. Offered to repeat study- pt declines.

## 2017-06-16 NOTE — Progress Notes (Addendum)
Spoke with pt concerning discharge along with Home Health needs. Appointment [facility] 07/10/2017 at 1:00 PM. Pt request appointment to be around the end of the first week in Oct.

## 2017-06-17 ENCOUNTER — Telehealth: Payer: Self-pay | Admitting: *Deleted

## 2017-06-17 DIAGNOSIS — R0602 Shortness of breath: Secondary | ICD-10-CM

## 2017-06-17 DIAGNOSIS — R942 Abnormal results of pulmonary function studies: Secondary | ICD-10-CM

## 2017-06-17 NOTE — Telephone Encounter (Signed)
Notes recorded by [name], MD on 06/08/2017 at 12:00 AM EDT ABnormal please refer to Dr. Chase Caller

## 2017-06-18 ENCOUNTER — Institutional Professional Consult (permissible substitution): Payer: Medicare (Managed Care) | Admitting: Internal Medicine

## 2017-06-19 ENCOUNTER — Telehealth: Payer: Self-pay

## 2017-06-19 NOTE — Telephone Encounter (Signed)
Called pt per Dr. Radford Pax to see if she can walk up 2 flights of stairs or 4 blocks without CP or SOB, left a message for her to call back.

## 2017-06-19 NOTE — Telephone Encounter (Signed)
Please find out if patient can walk 4 blocks or 2 flights of stairs without CP or SOB

## 2017-06-19 NOTE — Telephone Encounter (Signed)
   [facility] Pre-operative Risk Assessment    Request for surgical clearance:  1. What type of surgery is being performed? Colon surgery under general anesthesia  2. When is this surgery scheduled? Pending clearance   3. Are there any medications that need to be held prior to surgery and how long? None requested    4. Name of physician performing surgery? [facility]   5. What is your office phone and fax number?  1. Phone: [phone] 2. Fax: [phone]   Theodoro Parma 06/19/2017, 10:56 AM  _________________________________________________________________   (provider comments below)

## 2017-06-20 ENCOUNTER — Other Ambulatory Visit: Payer: Self-pay | Admitting: Cardiology

## 2017-06-22 ENCOUNTER — Institutional Professional Consult (permissible substitution): Payer: Medicare (Managed Care) | Admitting: Emergency Medicine

## 2017-06-22 ENCOUNTER — Telehealth: Payer: Self-pay

## 2017-06-22 NOTE — Telephone Encounter (Signed)
-----   Message from Mauri Pole, MD sent at 06/19/2017  8:07 AM EDT ----- Thank you Saralyn Pilar. Beth, can you please do a recall for colonoscopy in 1 year. 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  I discussed the results with the patient and I know surgery saw her in the hospital.  I wanted to relay the results to you and have you arrange for appropriate follow up with GI and Onc, as she was signed out to me to perform the EGD/Colon.  Thanks!  Saralyn Pilar

## 2017-06-22 NOTE — Telephone Encounter (Signed)
Her plan is to have colon surgery due to the colon mass. This phone note is for surgery clearance. See previous messages.

## 2017-06-22 NOTE — Telephone Encounter (Signed)
She is at low to moderate risk for cardiac complications in the peri operative period mainly due to risk of volume overload with exacerbation of her chronic diastolic CHF.  Would avoid fluid overload and continue cardiac meds.  In regards to her PAF, her CHADS2VASC score is 3 so low risk to be off anticoagulation for surgery.  OK to hold NOAC for surgery.

## 2017-06-22 NOTE — Telephone Encounter (Signed)
Patient stated she gets SOB with walking 2 flights of stairs or walking 4 blocks, but she does not have chest pain with these activities. Patient wanted to let Dr. Radford Pax also know that she was told to hold her xarelto due to a lower GI mass that has caused her anemia. Informed patient that her message would be sent to Dr. Radford Pax for her clearance.

## 2017-06-22 NOTE — Telephone Encounter (Signed)
Anemia likely causing SOB.  Please have patient notify us of the plan when established regarding colon mass

## 2017-06-22 NOTE — Telephone Encounter (Signed)
Follow up     Pt is returning [facility] call from Friday , she also needs to discuss her Xarelto   Pt c/o medication issue:  1. Name of Medication: xarelto  2. How are you currently taking this medication (dosage and times per day)?  No taking it   3. Are you having a reaction (difficulty breathing--STAT)? Yes  4. What is your medication issue?  Hospital stopped it because they said she had internal bleeding

## 2017-06-22 NOTE — Telephone Encounter (Signed)
This phone note faxed to [facility]

## 2017-06-24 ENCOUNTER — Encounter: Payer: Self-pay | Admitting: Physician Assistant

## 2017-06-24 ENCOUNTER — Institutional Professional Consult (permissible substitution): Payer: Medicare (Managed Care) | Admitting: Pulmonary Disease

## 2017-06-24 NOTE — Progress Notes (Addendum)
Cardiology Office Note    Date:  06/25/2017  ID:  Amber Medina, DOB 11/15/1950, MRN [id number] PCP:  Patient, No Pcp Per  Cardiologist:  Dr. Radford Pax   Chief Complaint: f/u CHF  History of Present Illness:  Amber Medina is a 66 y.o. 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  Per review of chart, she has history of initial diagnosis of atrial fib in 09/2015 with acute CHF/RV failure felt secondary to obesity, EF unable to be assessed due to obesity but question systolic. She also had bradycardia/sinus pauses with hypoxemia felt due to sleep apnea. EP saw patient and felt brief sinus pauses were due to respiratory failure and did not feel she required PPM. She also had finding of low-density lesion in the left occipital lobe on CT head. CT head reported this area was either subacute CVA or encephalomalacia. IM suspected latter as no strokelike symptoms were reported; patient refused MRI due to claustrophobia. She was placed on rate controlling agents and amiodarone for suppression of atrial fib. Sleep study was strongly recommended but the patient did not comply, citing URI issues preventing her from completing this. 2D echo 10/2015 showed EF 60-65%, grade 1 DD, high ventricular filling pressure, mild TR. PFTs 02/2017 were abnormal prompting referral to pulmonology which is pending. Labs 02/2017 showed normal TSH, LFTS, Hgb 10.9, MCV 78 (prior values 14-15), plt 428, Cr 1.45 -> 1.28 following decrease in Lasix (prior baseline 1.2-1.5). The patient failed to follow up with urgent care/primary care for her anemia - she said she did not think it was a big deal. She previously declined to establish with PCP locally as her insurance only provides coverage in [place]. When I saw her in clinic  06/11/17 she was significantly SOB with exertion. Hemoglobin was rechecked and critically low in the 5 range prompting referral to the ED. Subsequent workup there revealed new adenocarcinoma of the colon. Her anticoagulation has been held. Other labs recently showed normal LFTs and TSH, last Cr 1.14. Xarelto remains on hold until after colon surgery - this is pending scheduling.   She comes in today reporting she overall feels much stronger. She is less SOB with exertion. She used to only be able to walk 10 steps before dyspnea but now can walk a block or so before having to stop. She was previously using a walker but is now using a cane. She has noticed increased LEE the last few days in the context of being on her feet much more than usual and dietary noncompliance. No chest pain or syncope. She has not felt any palpitations. Weight is overall stable.   Past Medical History:  Diagnosis Date  . Abnormal pulmonary function test 02/2017  . Chronic diastolic (congestive) heart failure ([place]) 10/11/2015  . CKD (chronic kidney disease), stage II   . Colon cancer ([place])   . Microcytic anemia    by labs 02/2017 -> adm 06/2017 with severe anemia, found to have colon CA.  . Morbid obesity ([place])   . Nocturnal hypoxemia   . Persistent atrial fibrillation ([place])   . Snoring   . Suspected sleep apnea     Past Surgical History:  Procedure Laterality Date  . COLONOSCOPY WITH PROPOFOL N/A 06/13/2017   Procedure: COLONOSCOPY WITH PROPOFOL;  Surgeon: Carol Ada, MD;  Location: [facility];  Service: Endoscopy;  Laterality: N/A;  . ESOPHAGOGASTRODUODENOSCOPY (EGD) WITH PROPOFOL N/A 06/13/2017   Procedure: ESOPHAGOGASTRODUODENOSCOPY (EGD) WITH PROPOFOL;  Surgeon: Carol Ada, MD;  Location: [facility];  Service: Endoscopy;  Laterality: N/A;  . TONSILLECTOMY      Current Medications: Current Meds  Medication Sig  . acetaminophen (TYLENOL) 500 MG tablet Take 1,000 mg by mouth every 8 (eight) hours as needed  for mild pain or moderate pain.  Marland Kitchen amiodarone (PACERONE) 200 MG tablet Take 1 tablet (200 mg total) by mouth daily.  Marland Kitchen diltiazem (CARDIZEM CD) 240 MG 24 hr capsule Take 1 capsule (240 mg total) by mouth daily.  . ferrous gluconate (FERGON) 324 MG tablet Take 1 tablet (324 mg total) by mouth 3 (three) times daily with meals.  . furosemide (LASIX) 40 MG tablet Take 1 tablet (40 mg total) by mouth daily.  . metoprolol succinate (TOPROL-XL) 25 MG 24 hr tablet take 1 tablet by mouth once daily  . potassium chloride SA (K-DUR,KLOR-CON) 20 MEQ tablet Take 1 tablet (20 mEq total) by mouth 2 (two) times daily.     Allergies:   Compazine [prochlorperazine edisylate]   Social History   Social History  . Marital status: Single    Spouse name: N/A  . Number of children: N/A  . Years of education: N/A   Social History Main Topics  . Smoking status: Former Research scientist (life sciences)  . Smokeless tobacco: Never Used  . Alcohol use Yes  . Drug use: No  . Sexual activity: Not Asked   Other Topics Concern  . None   Social History Narrative  . None     Family History:  Family History  Problem Relation Age of Onset  . Diabetes Mellitus II Mother   . Hypertension Mother   . Congenital heart disease Mother   . Hypertension Father      ROS:   Please see the history of present illness. Otherwise, review of systems is positive for strange cold sensation in her chest previously upon wakening followed by urination (this happened in 2016 - she had mild recurrence shortly after DC but no further episodes after). All other systems are reviewed and otherwise negative.    PHYSICAL EXAM:   VS:  BP 126/78   Pulse 67   Ht 5\' 6"  (1.676 m)   Wt (!) 325 lb (147.4 kg)   LMP  (LMP Unknown)   SpO2 97%   BMI 52.46 kg/m   BMI: Body mass index is 52.46 kg/m. GEN: Well nourished, well developed WF, in no acute distress. Improved color  HEENT: normocephalic, atraumatic Neck: no JVD, carotid bruits, or masses Cardiac:  distant heart sounds likely due to obesity, RRR; no murmurs, rubs, or gallops,slight increase in bilateral lower extremity pedal edema superimposed on large leg habitus Respiratory:  clear to auscultation bilaterally, normal work of breathing GI: soft, nontender, nondistended, + BS MS: no deformity or atrophy  Skin: warm and dry, no rash Neuro:  Alert and Oriented x 3, Strength and sensation are intact, follows commands Psych: euthymic mood, full affect  Wt Readings from Last 3 Encounters:  06/25/17 (!) 325 lb (147.4 kg)  06/11/17 (!) 325 lb 2.9 oz (147.5 kg)  06/11/17 (!) 322 lb (146.1 kg)      Studies/Labs Reviewed:    EKG:   EKG was not ordered today  Recent Labs: 02/17/2017: TSH 3.010 06/11/2017: NT-Pro BNP 574 06/15/2017: ALT 20; BUN 15; Creatinine, Ser 1.14; Potassium 4.7; Sodium 139 06/16/2017:  Hemoglobin 8.6; Platelets 283   Lipid Panel    Component Value Date/Time   CHOL 155 09/09/2015 0414   TRIG 116 09/09/2015 0414   HDL 33 (L) 09/09/2015 0414   CHOLHDL 4.7 09/09/2015 0414   VLDL 23 09/09/2015 0414   LDLCALC 99 09/09/2015 0414    Additional studies/ records that were reviewed today include: Summarized above    ASSESSMENT & PLAN:   1. Persistent atrial fib - maintaining NSR by exam today on amiodarone. She will need to remain off Xarelto until after colon surgery per notes, due to life-threatening GI bleed. Would recommend to resume anticoagulation as soon as surgery/GI deems acceptable. I am hopeful the amiodarone will help her maintain normal rhythm in the interim to reduce her overall risk. 2. Acute on chronic diastolic CHF - weight is stable but she appears to have very mild increase in pedal edema. It is truly difficult to assess the grade given her morbid obesity. No palpable cords or erythema or tenderness. Will check labs. If Cr stable, will plan to consider increasing Lasix for several days. Reinforced dietary modifications with CHF. She admits to limited  funds for medical bills so will have her watch symptoms closely at home and call if symptoms worsen. Continue GI workup as planned. 3. Abnormal PFTs - already has planned f/u with pulmonology, also pending sleep study. 4. Severe anemia - recheck Hgb today. Per Dr. Theodosia Blender phone note, she has been cleared for surgery with statement "She is at low to moderate risk for cardiac complications in the peri operative period mainly due to risk of volume overload with exacerbation of her chronic diastolic CHF.  Would avoid fluid overload and continue cardiac meds." No further pre-operative testing indicated at this time from cardiac standpoint.  Disposition: F/u with Dr. Radford Pax in 3 months.   Medication Adjustments/Labs and Tests Ordered: Current medicines are reviewed at length with the patient today.  Concerns regarding medicines are outlined above. Medication changes, Labs and Tests ordered today are summarized above and listed in the Patient Instructions accessible in Encounters. Her AVS printed with Xarelto still on it but this was crossed out and patient affirms she knows not to take it further (and has not been taking it).  Signed, Charlie Pitter, PA-C  06/25/2017 11:18 AM    [facility] [address], [place], [place]  [address] Phone: [phone]; Fax: [phone]

## 2017-06-25 ENCOUNTER — Encounter: Payer: Self-pay | Admitting: Physician Assistant

## 2017-06-25 ENCOUNTER — Ambulatory Visit (INDEPENDENT_AMBULATORY_CARE_PROVIDER_SITE_OTHER): Payer: Medicare (Managed Care) | Admitting: Physician Assistant

## 2017-06-25 VITALS — BP 126/78 | HR 67 | Ht 66.0 in | Wt 325.0 lb

## 2017-06-25 DIAGNOSIS — I481 Persistent atrial fibrillation: Secondary | ICD-10-CM

## 2017-06-25 DIAGNOSIS — I5033 Acute on chronic diastolic (congestive) heart failure: Secondary | ICD-10-CM | POA: Diagnosis not present

## 2017-06-25 DIAGNOSIS — D649 Anemia, unspecified: Secondary | ICD-10-CM

## 2017-06-25 DIAGNOSIS — R942 Abnormal results of pulmonary function studies: Secondary | ICD-10-CM | POA: Diagnosis not present

## 2017-06-25 DIAGNOSIS — I4819 Other persistent atrial fibrillation: Secondary | ICD-10-CM

## 2017-06-25 LAB — CBC
Hematocrit: 35 % (ref 34.0–46.6)
Hemoglobin: 11 g/dL — ABNORMAL LOW (ref 11.1–15.9)
MCH: 24.5 pg — AB (ref 26.6–33.0)
MCHC: 31.4 g/dL — AB (ref 31.5–35.7)
MCV: 78 fL — ABNORMAL LOW (ref 79–97)
PLATELETS: 359 10*3/uL (ref 150–379)
RBC: 4.49 x10E6/uL (ref 3.77–5.28)
RDW: 29.2 % — ABNORMAL HIGH (ref 12.3–15.4)
WBC: 8.1 10*3/uL (ref 3.4–10.8)

## 2017-06-25 LAB — BASIC METABOLIC PANEL
BUN / CREAT RATIO: 15 (ref 12–28)
BUN: 18 mg/dL (ref 8–27)
CO2: 21 mmol/L (ref 20–29)
CREATININE: 1.19 mg/dL — AB (ref 0.57–1.00)
Calcium: 9.3 mg/dL (ref 8.7–10.3)
Chloride: 101 mmol/L (ref 96–106)
GFR, EST AFRICAN AMERICAN: 54 mL/min/{1.73_m2} — AB (ref >59)
GFR, EST NON AFRICAN AMERICAN: 47 mL/min/{1.73_m2} — AB (ref >59)
Glucose: 116 mg/dL — ABNORMAL HIGH (ref 65–99)
POTASSIUM: 4.6 mmol/L (ref 3.5–5.2)
SODIUM: 139 mmol/L (ref 134–144)

## 2017-06-25 LAB — PRO B NATRIURETIC PEPTIDE: NT-PRO BNP: 200 pg/mL (ref 0–301)

## 2017-06-25 NOTE — Patient Instructions (Addendum)
Medication Instructions:  Your physician recommends that you continue on your current medications as directed. Please refer to the Current Medication list given to you today. STOP THE XARELTO Labwork: TODAY:  BMET, CBC, & PRO BNP  Testing/Procedures: None ordere  Follow-Up: Your physician recommends that you schedule a follow-up appointment in: 3 MONTHS WITH DR. Radford Pax  Any Other Special Instructions Will Be Listed Below (If Applicable).    If you need a refill on your cardiac medications before your next appointment, please call your pharmacy.

## 2017-06-26 ENCOUNTER — Telehealth: Payer: Self-pay | Admitting: Physician Assistant

## 2017-06-26 NOTE — Telephone Encounter (Signed)
Returned call to patient 06/25/17 lab results given.Advised to keep appointment as planned and call sooner if needed.

## 2017-06-26 NOTE — Telephone Encounter (Signed)
Follow Up:     Returning Amber Medina's call from yesterday,concerning her lab results.

## 2017-06-27 ENCOUNTER — Other Ambulatory Visit: Payer: Self-pay | Admitting: Cardiology

## 2017-07-07 ENCOUNTER — Ambulatory Visit: Payer: Medicare (Managed Care) | Admitting: Physician Assistant

## 2017-07-10 ENCOUNTER — Ambulatory Visit: Payer: Medicare (Managed Care) | Admitting: Family Medicine

## 2017-07-12 ENCOUNTER — Other Ambulatory Visit: Payer: Self-pay | Admitting: Cardiology

## 2017-07-17 ENCOUNTER — Ambulatory Visit: Payer: Medicare (Managed Care) | Admitting: Family Medicine

## 2017-07-23 ENCOUNTER — Institutional Professional Consult (permissible substitution): Payer: Medicare (Managed Care) | Admitting: Pulmonary Disease

## 2017-08-04 NOTE — Telephone Encounter (Signed)
Cancel Sleep Study  Corrie Dandy, CMA  Cc: [facility]; [place], Clarene Duke..the patient called today & wants to cancel appt for 08-05-17 due to insurance. Needs to see her PCP here in [facility] & get referred AND she must call [facility] & see if they will approve. She is aware of all of this. She may RS Nov or Dec.   Please cancel this appt for me.  Thanks,Pam     Sleep study cancelled 08-03-17.

## 2017-08-05 ENCOUNTER — Encounter (HOSPITAL_BASED_OUTPATIENT_CLINIC_OR_DEPARTMENT_OTHER): Payer: Medicare (Managed Care)

## 2017-08-17 ENCOUNTER — Other Ambulatory Visit: Payer: Self-pay | Admitting: *Deleted

## 2017-08-17 MED ORDER — POTASSIUM CHLORIDE CRYS ER 20 MEQ PO TBCR: 20.0000 meq | EXTENDED_RELEASE_TABLET | Freq: Two times a day (BID) | ORAL | 2 refills | Status: DC

## 2017-08-18 ENCOUNTER — Ambulatory Visit: Payer: Medicare (Managed Care) | Admitting: Dietician

## 2017-09-03 ENCOUNTER — Encounter: Payer: Self-pay | Admitting: Physician Assistant

## 2017-09-17 ENCOUNTER — Ambulatory Visit: Payer: Medicare (Managed Care) | Admitting: Physician Assistant

## 2018-02-05 ENCOUNTER — Encounter (HOSPITAL_COMMUNITY): Payer: Self-pay | Admitting: Nurse Practitioner

## 2018-02-05 ENCOUNTER — Inpatient Hospital Stay (HOSPITAL_COMMUNITY)
Admission: EM | Admit: 2018-02-05 | Discharge: 2018-02-16 | DRG: 175 | Disposition: A | Discharge status: Home / Self Care | Payer: Medicare Other | Attending: Internal Medicine | Admitting: Internal Medicine

## 2018-02-05 ENCOUNTER — Emergency Department (HOSPITAL_COMMUNITY): Payer: Medicare Other

## 2018-02-05 DIAGNOSIS — R0602 Shortness of breath: Secondary | ICD-10-CM | POA: Diagnosis not present

## 2018-02-05 DIAGNOSIS — C189 Malignant neoplasm of colon, unspecified: Secondary | ICD-10-CM | POA: Diagnosis present

## 2018-02-05 DIAGNOSIS — C182 Malignant neoplasm of ascending colon: Secondary | ICD-10-CM | POA: Diagnosis present

## 2018-02-05 DIAGNOSIS — I5032 Chronic diastolic (congestive) heart failure: Secondary | ICD-10-CM | POA: Diagnosis not present

## 2018-02-05 DIAGNOSIS — Z8249 Family history of ischemic heart disease and other diseases of the circulatory system: Secondary | ICD-10-CM | POA: Diagnosis not present

## 2018-02-05 DIAGNOSIS — I5033 Acute on chronic diastolic (congestive) heart failure: Secondary | ICD-10-CM | POA: Diagnosis not present

## 2018-02-05 DIAGNOSIS — Z7901 Long term (current) use of anticoagulants: Secondary | ICD-10-CM | POA: Diagnosis not present

## 2018-02-05 DIAGNOSIS — Z79899 Other long term (current) drug therapy: Secondary | ICD-10-CM | POA: Diagnosis not present

## 2018-02-05 DIAGNOSIS — I2699 Other pulmonary embolism without acute cor pulmonale: Secondary | ICD-10-CM | POA: Diagnosis present

## 2018-02-05 DIAGNOSIS — I2609 Other pulmonary embolism with acute cor pulmonale: Principal | ICD-10-CM | POA: Diagnosis present

## 2018-02-05 DIAGNOSIS — J9601 Acute respiratory failure with hypoxia: Secondary | ICD-10-CM | POA: Diagnosis present

## 2018-02-05 DIAGNOSIS — N179 Acute kidney failure, unspecified: Secondary | ICD-10-CM | POA: Diagnosis present

## 2018-02-05 DIAGNOSIS — N182 Chronic kidney disease, stage 2 (mild): Secondary | ICD-10-CM | POA: Diagnosis present

## 2018-02-05 DIAGNOSIS — Z888 Allergy status to other drugs, medicaments and biological substances status: Secondary | ICD-10-CM

## 2018-02-05 DIAGNOSIS — I481 Persistent atrial fibrillation: Secondary | ICD-10-CM | POA: Diagnosis not present

## 2018-02-05 DIAGNOSIS — Z87891 Personal history of nicotine dependence: Secondary | ICD-10-CM

## 2018-02-05 DIAGNOSIS — R0902 Hypoxemia: Secondary | ICD-10-CM | POA: Diagnosis not present

## 2018-02-05 DIAGNOSIS — R0600 Dyspnea, unspecified: Secondary | ICD-10-CM

## 2018-02-05 DIAGNOSIS — D509 Iron deficiency anemia, unspecified: Secondary | ICD-10-CM | POA: Diagnosis not present

## 2018-02-05 DIAGNOSIS — I4891 Unspecified atrial fibrillation: Secondary | ICD-10-CM | POA: Diagnosis not present

## 2018-02-05 DIAGNOSIS — I480 Paroxysmal atrial fibrillation: Secondary | ICD-10-CM | POA: Diagnosis present

## 2018-02-05 DIAGNOSIS — E669 Obesity, unspecified: Secondary | ICD-10-CM | POA: Diagnosis not present

## 2018-02-05 DIAGNOSIS — I82409 Acute embolism and thrombosis of unspecified deep veins of unspecified lower extremity: Secondary | ICD-10-CM | POA: Diagnosis not present

## 2018-02-05 DIAGNOSIS — G4733 Obstructive sleep apnea (adult) (pediatric): Secondary | ICD-10-CM | POA: Diagnosis present

## 2018-02-05 DIAGNOSIS — I82432 Acute embolism and thrombosis of left popliteal vein: Secondary | ICD-10-CM | POA: Diagnosis present

## 2018-02-05 DIAGNOSIS — I4819 Other persistent atrial fibrillation: Secondary | ICD-10-CM | POA: Diagnosis present

## 2018-02-05 DIAGNOSIS — R609 Edema, unspecified: Secondary | ICD-10-CM | POA: Diagnosis not present

## 2018-02-05 DIAGNOSIS — Z6841 Body Mass Index (BMI) 40.0 and over, adult: Secondary | ICD-10-CM | POA: Diagnosis not present

## 2018-02-05 DIAGNOSIS — I361 Nonrheumatic tricuspid (valve) insufficiency: Secondary | ICD-10-CM | POA: Diagnosis not present

## 2018-02-05 LAB — CBC WITH DIFFERENTIAL/PLATELET
BASOS ABS: 0 10*3/uL (ref 0.0–0.1)
Basophils Relative: 0 %
Eosinophils Absolute: 0 10*3/uL (ref 0.0–0.7)
Eosinophils Relative: 0 %
HCT: 43.9 % (ref 36.0–46.0)
Hemoglobin: 14.3 g/dL (ref 12.0–15.0)
LYMPHS ABS: 1.4 10*3/uL (ref 0.7–4.0)
LYMPHS PCT: 12 %
MCH: 29.9 pg (ref 26.0–34.0)
MCHC: 32.6 g/dL (ref 30.0–36.0)
MCV: 91.8 fL (ref 78.0–100.0)
MONO ABS: 0.7 10*3/uL (ref 0.1–1.0)
Monocytes Relative: 6 %
NEUTROS ABS: 9.3 10*3/uL — AB (ref 1.7–7.7)
Neutrophils Relative %: 82 %
Platelets: 213 10*3/uL (ref 150–400)
RBC: 4.78 MIL/uL (ref 3.87–5.11)
RDW: 14.5 % (ref 11.5–15.5)
WBC: 11.4 10*3/uL — ABNORMAL HIGH (ref 4.0–10.5)

## 2018-02-05 LAB — BASIC METABOLIC PANEL
Anion gap: 14 (ref 5–15)
BUN: 25 mg/dL — AB (ref 6–20)
CHLORIDE: 102 mmol/L (ref 101–111)
CO2: 21 mmol/L — AB (ref 22–32)
Calcium: 9 mg/dL (ref 8.9–10.3)
Creatinine, Ser: 1.59 mg/dL — ABNORMAL HIGH (ref 0.44–1.00)
GFR calc Af Amer: 37 mL/min — ABNORMAL LOW (ref >60)
GFR calc non Af Amer: 32 mL/min — ABNORMAL LOW (ref >60)
GLUCOSE: 180 mg/dL — AB (ref 65–99)
POTASSIUM: 4.2 mmol/L (ref 3.5–5.1)
SODIUM: 137 mmol/L (ref 135–145)

## 2018-02-05 LAB — BRAIN NATRIURETIC PEPTIDE: B Natriuretic Peptide: 728.5 pg/mL — ABNORMAL HIGH (ref 0.0–100.0)

## 2018-02-05 LAB — D-DIMER, QUANTITATIVE: D-Dimer, Quant: 11.62 ug/mL-FEU — ABNORMAL HIGH (ref 0.00–0.50)

## 2018-02-05 LAB — TROPONIN I: TROPONIN I: 0.48 ng/mL — AB (ref <0.03)

## 2018-02-05 MED ORDER — DILTIAZEM HCL ER COATED BEADS 240 MG PO CP24: 240.0000 mg | ORAL_CAPSULE | Freq: Every day | ORAL | Status: DC

## 2018-02-05 MED ORDER — FUROSEMIDE 40 MG PO TABS: 40.0000 mg | ORAL_TABLET | Freq: Every day | ORAL | Status: DC

## 2018-02-05 MED ORDER — POTASSIUM CHLORIDE CRYS ER 20 MEQ PO TBCR: 20.0000 meq | EXTENDED_RELEASE_TABLET | Freq: Two times a day (BID) | ORAL | Status: DC

## 2018-02-05 MED ORDER — FERROUS GLUCONATE 324 (38 FE) MG PO TABS
324.0000 mg | ORAL_TABLET | Freq: Three times a day (TID) | ORAL | Status: DC
  Administered 2018-02-06 – 2018-02-16 (×30): 324 mg via ORAL
  Filled 2018-02-05 (×30): qty 1

## 2018-02-05 MED ORDER — IOPAMIDOL (ISOVUE-370) INJECTION 76%
80.0000 mL | Freq: Once | INTRAVENOUS | Status: AC | PRN
  Administered 2018-02-05: 80 mL via INTRAVENOUS

## 2018-02-05 MED ORDER — HEPARIN (PORCINE) IN NACL 100-0.45 UNIT/ML-% IJ SOLN
1650.0000 [IU]/h | INTRAMUSCULAR | Status: DC
  Administered 2018-02-05: 1650 [IU]/h via INTRAVENOUS
  Filled 2018-02-05: qty 250

## 2018-02-05 MED ORDER — TETRAHYDROZOLINE HCL 0.05 % OP SOLN: 1.0000 [drp] | Freq: Every day | OPHTHALMIC | Status: DC | PRN

## 2018-02-05 MED ORDER — METOPROLOL SUCCINATE ER 25 MG PO TB24: 25.0000 mg | ORAL_TABLET | Freq: Every day | ORAL | Status: DC

## 2018-02-05 MED ORDER — METOPROLOL SUCCINATE ER 25 MG PO TB24
25.0000 mg | ORAL_TABLET | Freq: Every day | ORAL | Status: DC
  Administered 2018-02-06 – 2018-02-16 (×10): 25 mg via ORAL
  Filled 2018-02-05 (×11): qty 1

## 2018-02-05 MED ORDER — ONDANSETRON HCL 4 MG PO TABS: 4.0000 mg | ORAL_TABLET | Freq: Four times a day (QID) | ORAL | Status: DC | PRN

## 2018-02-05 MED ORDER — ONDANSETRON HCL 4 MG/2ML IJ SOLN: 4.0000 mg | Freq: Four times a day (QID) | INTRAMUSCULAR | Status: DC | PRN

## 2018-02-05 MED ORDER — SODIUM CHLORIDE 0.9 % IJ SOLN
INTRAMUSCULAR | Status: AC
  Administered 2018-02-05: 21:00:00
  Filled 2018-02-05: qty 50

## 2018-02-05 MED ORDER — IOPAMIDOL (ISOVUE-370) INJECTION 76%
INTRAVENOUS | Status: AC
  Administered 2018-02-05: 21:00:00
  Filled 2018-02-05: qty 100

## 2018-02-05 MED ORDER — AMIODARONE HCL 200 MG PO TABS
200.0000 mg | ORAL_TABLET | Freq: Every day | ORAL | Status: DC
  Administered 2018-02-06 – 2018-02-16 (×11): 200 mg via ORAL
  Filled 2018-02-05 (×11): qty 1

## 2018-02-05 MED ORDER — HEPARIN BOLUS VIA INFUSION
3000.0000 [IU] | Freq: Once | INTRAVENOUS | Status: AC
  Administered 2018-02-05: 3000 [IU] via INTRAVENOUS
  Filled 2018-02-05: qty 3000

## 2018-02-05 MED ORDER — DILTIAZEM HCL ER COATED BEADS 120 MG PO CP24
240.0000 mg | ORAL_CAPSULE | Freq: Every day | ORAL | Status: DC
  Administered 2018-02-06 – 2018-02-16 (×10): 240 mg via ORAL
  Filled 2018-02-05 (×11): qty 2

## 2018-02-05 MED ORDER — ACETAMINOPHEN 500 MG PO TABS
1000.0000 mg | ORAL_TABLET | Freq: Three times a day (TID) | ORAL | Status: DC | PRN
  Administered 2018-02-12: 1000 mg via ORAL
  Filled 2018-02-05: qty 2

## 2018-02-05 NOTE — ED Provider Notes (Addendum)
Medical screening examination/treatment/procedure(s) were conducted as a shared visit with non-physician practitioner(s) and myself.  I personally evaluated the patient during the encounter.  EKG Interpretation  Date/Time:  Friday Feb 05 2018 18:24:03 EDT Ventricular Rate:  77 PR Interval:    QRS Duration: 124 QT Interval:  454 QTC Calculation: 514 R Axis:   -58 Text Interpretation:  Sinus rhythm Nonspecific IVCD with LAD Consider anterior infarct no change from previous Confirmed by Charlesetta Shanks [phone]) on 02/05/2018 7:24:32 PM Patient reports she started getting short of breath yesterday.  She noted with short walks she was starting to get short of breath.  She reports that she tried to rest and temporarily felt improved but then got up to go to the bathroom and was quite winded.  She reports she became diaphoretic.  She denies she had chest pain at any point in time.  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  She noted the left leg was more swollen than the right.  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  She does not have history of PE but positive history of atrial fibrillation and congestive heart failure.  Patient is alert and appropriate.  Mild increased work of breathing at rest.  Heart is regular.  No gross rub murmur gallop.  Lungs are clear.  Patient has 1-2+ pitting edema bilateral lower extremities.  No wounds.  No active cellulitis.  Patient has mild troponin elevation with unchanged EKG.  She also has hypoxia.  Patient does not have baseline home oxygen.  At this time, differential diagnosis includes congestive heart failure, ACS, PE.  Will continue diagnostic evaluation.  Patient is chest pain-free and does not show signs of impending respiratory failure.  I agree with plan of management.  CT PE study reviewed initially by myself.  Positive for bilateral pulmonary embolus.  Heparin per pharmacy protocol  initiated.  CRITICAL CARE Performed by: Si Gaul   Total critical care time:20 minutes  Critical care time was exclusive of separately billable procedures and treating other patients.  Critical care was necessary to treat or prevent imminent or life-threatening deterioration.  Critical care was time spent personally by me on the following activities: development of treatment plan with patient and/or surrogate as well as nursing, discussions with consultants, evaluation of patient's response to treatment, examination of patient, obtaining history from patient or surrogate, ordering and performing treatments and interventions, ordering and review of laboratory studies, ordering and review of radiographic studies, pulse oximetry and re-evaluation of patient's condition.   Charlesetta Shanks, MD 02/05/18 Raye Sorrow    Charlesetta Shanks, MD 02/06/18 [phone]

## 2018-02-05 NOTE — ED Triage Notes (Signed)
Pt is presented by EMS from home, c/o shortness of breath with gradual onset of yesterday, she adds that she noticed increased lower extremity swelling so much so that he compression stockings did not fit, she also noticed fluid build in her stomach that she remarks as unusual. States that she still is taking her lasix as prescribed. Denies fever, chills or malaise.

## 2018-02-05 NOTE — ED Notes (Signed)
Bed: WA16 Expected date:  Expected time:  Means of arrival:  Comments: EMS-SOB 

## 2018-02-05 NOTE — H&P (Signed)
History and Physical    Amber Medina YHC:[id number] DOB: [birth year] DOA: 02/05/2018  PCP: [name], MD  Patient coming from: Home  I have personally briefly reviewed patient's old medical records in [place]  Chief Complaint: SOB  HPI: Amber Medina is a 67 y.o. female with medical history significant of A.Fib, chronic diastolic CHF, morbid obesity, colon cancer causing GIB in Sept 2018 that she hasnt yet had removed surgically or followed up on due to insurance issues apparently.  Presents to ED with c/o SOB.  Patient reports she started getting short of breath yesterday.  Has some DOE at baseline but much worse yesterday.  She noted with short walks she was starting to get short of breath.  She reports that she tried to rest and temporarily felt improved but then got up to go to the bathroom and was quite winded.  She reports she became diaphoretic.  She denies she had chest pain at any point in time.  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  She noted the left leg was more swollen than the right.   ED Course: Satting 84% on RA, satting mid 90s on 2-3L via [place].  D.Dimer 11.x, Trop 0.48.  BNP 700.  CTA confirms bilateral pulmonary emboli, large clot burden, and evidence of R heart strain.  Mild AKI with creat 1.5 up from 1.1 baseline.  Review of Systems: As per HPI otherwise 10 point review of systems negative.   Past Medical History:  Diagnosis Date  . Abnormal pulmonary function test 02/2017  . Chronic diastolic (congestive) heart failure ([place]) 10/11/2015  . CKD (chronic kidney disease), stage II   . Colon cancer ([place])   . Microcytic anemia    by labs 02/2017 -> adm 06/2017 with severe anemia, found to have colon CA.  . Morbid obesity ([place])   . Nocturnal hypoxemia   . Persistent atrial fibrillation ([place])   . Snoring   . Suspected sleep apnea     Past Surgical History:  Procedure Laterality Date  . COLONOSCOPY WITH PROPOFOL  N/A 06/13/2017   Procedure: COLONOSCOPY WITH PROPOFOL;  Surgeon: Carol Ada, MD;  Location: [facility];  Service: Endoscopy;  Laterality: N/A;  . ESOPHAGOGASTRODUODENOSCOPY (EGD) WITH PROPOFOL N/A 06/13/2017   Procedure: ESOPHAGOGASTRODUODENOSCOPY (EGD) WITH PROPOFOL;  Surgeon: Carol Ada, MD;  Location: [facility];  Service: Endoscopy;  Laterality: N/A;  . TONSILLECTOMY       reports that she has quit smoking. She has never used smokeless tobacco. She reports that she drinks alcohol. She reports that she does not use drugs.  Allergies  Allergen Reactions  . Compazine [Prochlorperazine Edisylate] Swelling    Family History  Problem Relation Age of Onset  . Diabetes Mellitus II Mother   . Hypertension Mother   . Congenital heart disease Mother   . Hypertension Father      Prior to Admission medications   Medication Sig Start Date End Date Taking? Authorizing Provider  acetaminophen (TYLENOL) 500 MG tablet Take 1,000 mg by mouth every 8 (eight) hours as needed for mild pain or moderate pain.   Yes [provider]  amiodarone (PACERONE) 200 MG tablet Take 1 tablet (200 mg total) by mouth daily. 07/14/17  Yes Turner, Eber Hong, MD  Ascorbic Acid (VITAMIN C PO) Take by mouth.   Yes [provider]  CALCIUM PO Take by mouth.   Yes [provider]  Coenzyme Q10 (CO Q 10  PO) Take by mouth.   Yes [provider]  diltiazem (CARDIZEM CD) 240 MG 24 hr capsule Take 1 capsule (240 mg total) by mouth daily. 02/17/17  Yes Turner, Eber Hong, MD  ferrous gluconate (FERGON) 324 MG tablet Take 1 tablet (324 mg total) by mouth 3 (three) times daily with meals. 06/16/17  Yes Oswald Hillock, MD  furosemide (LASIX) 40 MG tablet Take 1 tablet (40 mg total) by mouth daily. 02/19/17 02/14/18 Yes Turner, Eber Hong, MD  metoprolol succinate (TOPROL-XL) 25 MG 24 hr tablet take 1 tablet by mouth once daily 06/29/17  Yes Turner, Eber Hong, MD  Multiple Vitamins-Minerals (ALIVE WOMENS  50+ PO) Take by mouth.   Yes [provider]  potassium chloride SA (K-DUR,KLOR-CON) 20 MEQ tablet Take 1 tablet (20 mEq total) 2 (two) times daily by mouth. 08/17/17  Yes Turner, Eber Hong, MD  Tetrahydrozoline HCl (VISINE OP) Place 1-2 drops into both eyes daily as needed (allergies/dry eye).   Yes [provider]  TURMERIC PO Take by mouth.   Yes [provider]    Physical Exam: Vitals:   02/05/18 1752 02/05/18 1857 02/05/18 2117  BP: 132/71 108/79 124/61  Pulse: 78 74 73  Resp: 19 18 14   Temp: (!) 97.5 F (36.4 C)    TempSrc: Oral    SpO2: 93% 94% 94%  Weight: 136.1 kg (300 lb)    Height: 5\' 7"  (1.702 m)      Constitutional: NAD, calm, comfortable Eyes: PERRL, lids and conjunctivae normal ENMT: Mucous membranes are moist. Posterior pharynx clear of any exudate or lesions.Normal dentition.  Neck: normal, supple, no masses, no thyromegaly Respiratory: clear to auscultation bilaterally, no wheezing, no crackles. Normal respiratory effort. No accessory muscle use.  Cardiovascular: Regular rate and rhythm, no murmurs / rubs / gallops. No extremity edema. 2+ pedal pulses. No carotid bruits.  Abdomen: no tenderness, no masses palpated. No hepatosplenomegaly. Bowel sounds positive.  Musculoskeletal: no clubbing / cyanosis. No joint deformity upper and lower extremities. Good ROM, no contractures. Normal muscle tone.  Skin: no rashes, lesions, ulcers. No induration Neurologic: CN 2-12 grossly intact. Sensation intact, DTR normal. Strength 5/5 in all 4.  Psychiatric: Normal judgment and insight. Alert and oriented x 3. Normal mood.    Labs on Admission: I have personally reviewed following labs and imaging studies  CBC: Recent Labs  Lab 02/05/18 1825  WBC 11.4*  NEUTROABS 9.3*  HGB 14.3  HCT 43.9  MCV 91.8  PLT 163   Basic Metabolic Panel: Recent Labs  Lab 02/05/18 1825  NA 137  K 4.2  CL 102  CO2 21*  GLUCOSE 180*  BUN 25*  CREATININE 1.59*   CALCIUM 9.0   GFR: Estimated Creatinine Clearance: 48.2 mL/min (A) (by C-G formula based on SCr of 1.59 mg/dL (H)). Liver Function Tests: No results for input(s): AST, ALT, ALKPHOS, BILITOT, PROT, ALBUMIN in the last 168 hours. No results for input(s): LIPASE, AMYLASE in the last 168 hours. No results for input(s): AMMONIA in the last 168 hours. Coagulation Profile: No results for input(s): INR, PROTIME in the last 168 hours. Cardiac Enzymes: Recent Labs  Lab 02/05/18 1825  TROPONINI 0.48*   BNP (last 3 results) Recent Labs    06/11/17 1200 06/25/17 1147  PROBNP 574* 200   HbA1C: No results for input(s): HGBA1C in the last 72 hours. CBG: No results for input(s): GLUCAP in the last 168 hours. Lipid Profile: No results for input(s): CHOL, HDL, LDLCALC,  TRIG, CHOLHDL, LDLDIRECT in the last 72 hours. Thyroid Function Tests: No results for input(s): TSH, T4TOTAL, FREET4, T3FREE, THYROIDAB in the last 72 hours. Anemia Panel: No results for input(s): VITAMINB12, FOLATE, FERRITIN, TIBC, IRON, RETICCTPCT in the last 72 hours. Urine analysis: No results found for: COLORURINE, APPEARANCEUR, LABSPEC, PHURINE, GLUCOSEU, HGBUR, BILIRUBINUR, KETONESUR, PROTEINUR, UROBILINOGEN, NITRITE, LEUKOCYTESUR  Radiological Exams on Admission: Dg Chest 2 View  Result Date: 02/05/2018 CLINICAL DATA:  Short of breath EXAM: CHEST - 2 VIEW COMPARISON:  09/06/2015 FINDINGS: Limited by habitus and soft tissues. The heart appears slightly enlarged. There is probable mild vascular congestion. Small pleural effusions. Increased opacity at the left base on frontal view likely largely related to overlying soft tissue as no correlate seen on the lateral view. IMPRESSION: 1. Mild cardiomegaly with central congestion 2. Small pleural effusions. Electronically Signed   By: Donavan Foil M.D.   On: 02/05/2018 19:28   Ct Angio Chest Pe W And/or Wo Contrast  Result Date: 02/05/2018 CLINICAL DATA:  Short of breath.  Increase in lower extremity swelling. Concern for pulmonary embolism. Positive D-dimer. EXAM: CT ANGIOGRAPHY CHEST WITH CONTRAST TECHNIQUE: Multidetector CT imaging of the chest was performed using the standard protocol during bolus administration of intravenous contrast. Multiplanar CT image reconstructions and MIPs were obtained to evaluate the vascular anatomy. CONTRAST:  26mL ISOVUE-370 IOPAMIDOL (ISOVUE-370) INJECTION 76% COMPARISON:  None. FINDINGS: Cardiovascular: Proximal filling defect within the RIGHT upper lobe pulmonary artery and RIGHT lower lobe pulmonary artery consistent with acute pulmonary emboli. Emboli extend to the lower lobe segmental pulmonary arteries on the RIGHT. Filling defect within the proximal lingula pulmonary artery as well as the segmental branches of the LEFT lower lobe pulmonary arteries. Distal segmental emboli in the LEFT upper lobe pulmonary artery. The RIGHT ventricular to LEFT ventricular ratio is greater than 1 (41 mm / 17 mm equals 2.4) No acute findings aorta great vessels.  No pericardial effusion. Mediastinum/Nodes: No axillary supraclavicular adenopathy. No mediastinal hilar adenopathy. No pericardial effusion. Esophagus. Lungs/Pleura: No pulmonary infarction.  No infiltrate or edema. Upper Abdomen: Limited view of the liver, kidneys, pancreas are unremarkable. Normal adrenal glands. Probable small adenoma of the LEFT adrenal gland. No aggressive osseous lesion Musculoskeletal: No aggressive osseous lesion Review of the MIP images confirms the above findings. IMPRESSION: 1. Bilateral pulmonary emboli within the proximal and segmental branches of the RIGHT upper lobe and lower lobe as well as the segmental branches of the LEFT upper lobe and LEFT lower lobe. Overall clot burden is severe. 2. CT evidence of right heart strain (RV/LV Ratio = 2.4) consistent with at least submassive (intermediate risk) PE. The presence of right heart strain has been associated with an  increased risk of morbidity and mortality. Please activate Code PE by paging [phone]. Critical Value/emergent results were called by telephone at the time of interpretation on 02/05/2018 at 9:04 pm to Dr. Carmon Sails , who verbally acknowledged these results. Electronically Signed   By: Suzy Bouchard M.D.   On: 02/05/2018 21:06    EKG: Independently reviewed.  Assessment/Plan Principal Problem:   Acute pulmonary embolism with acute cor pulmonale (HCC) Active Problems:   Acute respiratory failure with hypoxia (HCC)   Chronic diastolic (congestive) heart failure (HCC)   Persistent atrial fibrillation (HCC)   Adenocarcinoma of colon ([place])    1. Acute bilateral pulmonary emboli with acute cor pulmonale - and acute hypoxic respiratory failure with new O2 requirement. 1. Heparin gtt 2. Tele monitor 3. 2d echo 4.  BLE venous duplex 5. PCCM called, they will formally consult in AM 1. Watch closely for evidence of bleeding on heparin gtt given known colon cancer and GIB Sept last year. 6. O2 via [place] 2. Adenocarcinoma of colon - 1. Discovered on colonoscopy for GIB in Sept last year, biopsied and tattooed. 2. Not yet handled surgically due to insurance issues she had last year!  Apparently now resolved after 1st of year but still not surgically resected yet. 3. Call gen surg in AM 1. ? Need for repeat imaging? 2. Timing of surgery especially now given diagnosis of the new acute PE?  I dont think she will be going to OR this admit given acute PE, large clot burden and acute cor pulmonale but probably may not want to wait the full 6 month treatment duration either. 3. Mild AKI - 1. Hold lasix 2. Repeat BMP in AM 4. PAF - 1. Currently NSR 2. Continue amiodarone, cardizem, metoprolol 5. Chronic diastolic CHF - 1. Holding lasix as above  DVT prophylaxis: Heparin gtt Code Status: Full Family Communication: No family in room Disposition Plan: Home after admit Consults called: PCCM,  spoke with Dr. Jimmy Footman, PCCM will consult in AM. Admission status: Admit to inpatient   Etta Quill DO [facility] Pager [phone]  If 7AM-7PM, please contact day team taking care of patient [email or web address] Password [email or web address]  02/05/2018, 10:26 PM

## 2018-02-05 NOTE — Progress Notes (Addendum)
[place] for IV heparin Indication: pulmonary embolus (multiple)  Allergies  Allergen Reactions  . Compazine [Prochlorperazine Edisylate] Swelling    Patient Measurements: Height: 5\' 7"  (170.2 cm) Weight: 300 lb (136.1 kg) IBW/kg (Calculated) : 61.6 Heparin Dosing Weight: using [place] nomogram for heparin dosing  Vital Signs: Temp: 97.5 F (36.4 C) (05/03 1752) Temp Source: Oral (05/03 1752) BP: 124/61 (05/03 2117) Pulse Rate: 73 (05/03 2117)  Labs: Recent Labs    02/05/18 1825  HGB 14.3  HCT 43.9  PLT 213  CREATININE 1.59*  TROPONINI 0.48*    Estimated Creatinine Clearance: 48.2 mL/min (A) (by C-G formula based on SCr of 1.59 mg/dL (H)).   Medical History: Past Medical History:  Diagnosis Date  . Abnormal pulmonary function test 02/2017  . Chronic diastolic (congestive) heart failure ([place]) 10/11/2015  . CKD (chronic kidney disease), stage II   . Colon cancer ([place])   . Microcytic anemia    by labs 02/2017 -> adm 06/2017 with severe anemia, found to have colon CA.  . Morbid obesity ([place])   . Nocturnal hypoxemia   . Persistent atrial fibrillation ([place])   . Snoring   . Suspected sleep apnea     Medications:   (Not in a hospital admission) Scheduled:  . heparin  3,000 Units Intravenous Once   Infusions:  . heparin     PRN:   Assessment: 27 yoF with Hx persistent Afib on rate/rhythm control but not on anticoagulation, dCHF, morbid obesity, and colon cancer, presenting with SOB and hypoxia. D-dimer as well as BNP/troponin elevated. CTA reveals bilateral proximal and segmental pulmonary emboli with severe overall clot burden and and evidence of RH strain. Pharmacy consulted to start IV heparin.   Of note, patient admitted in September 2018 for diverticular bleed on Xarelto requiring 4 units PRBC. Xarelto was held at this time pending tumor resection for colon cancer. Appears patient was not able to complete follow-up  and was not resumed on anticoagulation nor had any colon tumor resection.   Baseline INR, aPTT: none recent; previously had elevated INR last year due to being on Xarelto, but otherwise historically normal  Prior anticoagulation: none  Significant events:  Today, 02/05/2018:  CBC: WNL  No bleeding or infusion issues per nursing  CrCl: 48 ml/min  Goal of Therapy: Heparin level 0.3-0.7 units/ml Monitor platelets by anticoagulation protocol: Yes  Plan:  Heparin 3000 units IV bolus x 1  Heparin 1650 units/hr IV infusion  Check heparin level 6 hrs after start  Daily CBC, daily heparin level once stable  Monitor for signs of bleeding or thrombosis   Reuel Boom, PharmD, BCPS [phone] 02/05/2018, 9:25 PM

## 2018-02-05 NOTE — ED Provider Notes (Signed)
[facility] DEPT Provider Note   CSN: [id number] Arrival date & time: 02/05/18  1749     History   Chief Complaint Chief Complaint  Patient presents with  . Shortness of Breath    HPI Amber Medina is a 67 y.o. female w/ h/o persistent a-fib on amiodarone and diltiazem not on anticoagulation, diastolic heart failure EF 60-65%, morbid obesity, colon cancer here for evaluation of shortness of breath described as "cannot get air into my lungs" since last night.  At baseline she has exertional dyspnea however usually able to walk a couple of ailes in grocery stores without difficulty. Now SOB constant while at rest and on exertion.  Requiring 3L [place] for oxygen sat > 90% in ED. Has chronic lower extremity edema usually worse on the left however last night both legs were swollen and left lower extremity was even more swollen than normal.  Also felt hot and sweaty last night.  No measured fever.  She has been compliant with amiodarone and diltiazem.  Taking Lasix 40 mg once daily.  No blood thinners.  Gets followed up by Dr. Radford Pax with cardiology.  Recently diagnosed with colon cancer but unable to follow-up for this due to insurance.  HPI  Past Medical History:  Diagnosis Date  . Abnormal pulmonary function test 02/2017  . Chronic diastolic (congestive) heart failure ([place]) 10/11/2015  . CKD (chronic kidney disease), stage II   . Colon cancer ([place])   . Microcytic anemia    by labs 02/2017 -> adm 06/2017 with severe anemia, found to have colon CA.  . Morbid obesity ([place])   . Nocturnal hypoxemia   . Persistent atrial fibrillation ([place])   . Snoring   . Suspected sleep apnea     Patient Active Problem List   Diagnosis Date Noted  . Colonic mass 06/15/2017  . Anemia 06/11/2017  . Chronic diastolic (congestive) heart failure ([place]) 10/11/2015  . Persistent atrial fibrillation ([place]) 10/11/2015  . OSA (obstructive sleep apnea)   . Acute respiratory failure with hypoxia  ([place])   . Hyperglycemia 09/06/2015  . Anasarca 09/06/2015  . Obesity, morbid ([place]) 09/06/2015    Past Surgical History:  Procedure Laterality Date  . COLONOSCOPY WITH PROPOFOL N/A 06/13/2017   Procedure: COLONOSCOPY WITH PROPOFOL;  Surgeon: Carol Ada, MD;  Location: [facility] END[facility]COPY;  Service: Endoscopy;  Laterality: N/A;  . ESOPHAGOGASTRODUODENOSCOPY (EGD) WITH PROPOFOL N/A 06/13/2017   Procedure: ESOPHAGOGASTRODUODENOSCOPY (EGD) WITH PROPOFOL;  Surgeon: Carol Ada, MD;  Location: [facility];  Service: Endoscopy;  Laterality: N/A;  . TONSILLECTOMY       OB History   None      Home Medications    Prior to Admission medications   Medication Sig Start Date End Date Taking? Authorizing Provider  acetaminophen (TYLENOL) 500 MG tablet Take 1,000 mg by mouth every 8 (eight) hours as needed for mild pain or moderate pain.   Yes [provider]  amiodarone (PACERONE) 200 MG tablet Take 1 tablet (200 mg total) by mouth daily. 07/14/17  Yes Turner, Eber Hong, MD  Ascorbic Acid (VITAMIN C PO) Take by mouth.   Yes [provider]  CALCIUM PO Take by mouth.   Yes [provider]  Coenzyme Q10 (CO Q 10 PO) Take by mouth.   Yes [provider]  diltiazem (CARDIZEM CD) 240 MG 24 hr capsule Take 1 capsule (240 mg total) by mouth daily. 02/17/17  Yes [name], MD  ferrous gluconate (FERGON) 324 MG  tablet Take 1 tablet (324 mg total) by mouth 3 (three) times daily with meals. 06/16/17  Yes Oswald Hillock, MD  furosemide (LASIX) 40 MG tablet Take 1 tablet (40 mg total) by mouth daily. 02/19/17 02/14/18 Yes Turner, Eber Hong, MD  metoprolol succinate (TOPROL-XL) 25 MG 24 hr tablet take 1 tablet by mouth once daily 06/29/17  Yes Turner, Eber Hong, MD  Multiple Vitamins-Minerals (ALIVE WOMENS 50+ PO) Take by mouth.   Yes [provider]  potassium chloride SA (K-DUR,KLOR-CON) 20 MEQ tablet Take 1 tablet (20 mEq total) 2 (two) times daily by mouth. 08/17/17  Yes  Turner, Eber Hong, MD  Tetrahydrozoline HCl (VISINE OP) Place 1-2 drops into both eyes daily as needed (allergies/dry eye).   Yes [provider]  TURMERIC PO Take by mouth.   Yes [provider]  amiodarone (PACERONE) 200 MG tablet Take 1 tablet (200 mg total) by mouth daily. Patient not taking: Reported on 02/05/2018 03/27/17   [name], MD    Family History Family History  Problem Relation Age of Onset  . Diabetes Mellitus II Mother   . Hypertension Mother   . Congenital heart disease Mother   . Hypertension Father     Social History Social History   Tobacco Use  . Smoking status: Former Research scientist (life sciences)  . Smokeless tobacco: Never Used  Substance Use Topics  . Alcohol use: Yes  . Drug use: No     Allergies   Compazine [prochlorperazine edisylate]   Review of Systems Review of Systems  Respiratory: Positive for shortness of breath.   Cardiovascular: Positive for leg swelling.  Gastrointestinal: Positive for abdominal distention.  All other systems reviewed and are negative.    Physical Exam Updated Vital Signs BP 124/61   Pulse 73   Temp (!) 97.5 F (36.4 C) (Oral)   Resp 14   Ht 5\' 7"  (1.702 m)   Wt 136.1 kg (300 lb)   LMP  (LMP Unknown)   SpO2 94%   BMI 46.99 kg/m   Physical Exam  Constitutional: She appears well-developed and well-nourished.  HENT:  Head: Normocephalic and atraumatic.  Nose: Nose normal.  Moist mucous membranes. Tonsils and oropharynx normal  Eyes: Conjunctivae, EOM and lids are normal.  Neck: Trachea normal and normal range of motion.  No obvious JVD, difficult exam due to morbid obesity   Cardiovascular: Normal rate, regular rhythm, S1 normal, S2 normal and normal heart sounds.  Pulses:      Radial pulses are 2+ on the right side, and 2+ on the left side.       Dorsalis pedis pulses are 2+ on the right side, and 2+ on the left side.  2+ pitting edema from left ankle to left knee. 1+ pitting edema from left ankle to  mid tib/fib. RRR. No orthopnea. No calf tenderness.   Pulmonary/Chest: Effort normal. No respiratory distress. She has decreased breath sounds. She has no rhonchi.  84% on RA requiring 3 L [place] for oxygen > 90%. Decreased breath sounds bilateral lower lobes, difficult exam due to morbid obesity. No crackles.  Abdominal: Soft. Bowel sounds are normal. There is no tenderness.  No epigastric tenderness. No distention.   Neurological: She is alert. GCS eye subscore is 4. GCS verbal subscore is 5. GCS motor subscore is 6.  Skin: Skin is warm and dry. Capillary refill takes less than 2 seconds.  No rash to chest wall  Psychiatric: She has a normal mood and affect. Her  speech is normal and behavior is normal. Judgment and thought content normal. Cognition and memory are normal.     ED Treatments / Results  Labs (all labs ordered are listed, but only abnormal results are displayed) Labs Reviewed  BASIC METABOLIC PANEL - Abnormal; Notable for the following components:      Result Value   CO2 21 (*)    Glucose, Bld 180 (*)    BUN 25 (*)    Creatinine, Ser 1.59 (*)    GFR calc non Af Amer 32 (*)    GFR calc Af Amer 37 (*)    All other components within normal limits  TROPONIN I - Abnormal; Notable for the following components:   Troponin I 0.48 (*)    All other components within normal limits  CBC WITH DIFFERENTIAL/PLATELET - Abnormal; Notable for the following components:   WBC 11.4 (*)    Neutro Abs 9.3 (*)    All other components within normal limits  BRAIN NATRIURETIC PEPTIDE - Abnormal; Notable for the following components:   B Natriuretic Peptide 728.5 (*)    All other components within normal limits  D-DIMER, QUANTITATIVE (NOT AT [facility]) - Abnormal; Notable for the following components:   D-Dimer, Quant 11.62 (*)    All other components within normal limits  CBC  HEPARIN LEVEL (UNFRACTIONATED)    EKG EKG Interpretation  Date/Time:  Friday Feb 05 2018 18:24:03 EDT Ventricular  Rate:  77 PR Interval:    QRS Duration: 124 QT Interval:  454 QTC Calculation: 514 R Axis:   -58 Text Interpretation:  Sinus rhythm Nonspecific IVCD with LAD Consider anterior infarct no change from previous Confirmed by Charlesetta Shanks [id number]) on 02/05/2018 7:24:32 PM   Radiology Dg Chest 2 View  Result Date: 02/05/2018 CLINICAL DATA:  Short of breath EXAM: CHEST - 2 VIEW COMPARISON:  09/06/2015 FINDINGS: Limited by habitus and soft tissues. The heart appears slightly enlarged. There is probable mild vascular congestion. Small pleural effusions. Increased opacity at the left base on frontal view likely largely related to overlying soft tissue as no correlate seen on the lateral view. IMPRESSION: 1. Mild cardiomegaly with central congestion 2. Small pleural effusions. Electronically Signed   By: Donavan Foil M.D.   On: 02/05/2018 19:28   Ct Angio Chest Pe W And/or Wo Contrast  Result Date: 02/05/2018 CLINICAL DATA:  Short of breath. Increase in lower extremity swelling. Concern for pulmonary embolism. Positive D-dimer. EXAM: CT ANGIOGRAPHY CHEST WITH CONTRAST TECHNIQUE: Multidetector CT imaging of the chest was performed using the standard protocol during bolus administration of intravenous contrast. Multiplanar CT image reconstructions and MIPs were obtained to evaluate the vascular anatomy. CONTRAST:  51mL ISOVUE-370 IOPAMIDOL (ISOVUE-370) INJECTION 76% COMPARISON:  None. FINDINGS: Cardiovascular: Proximal filling defect within the RIGHT upper lobe pulmonary artery and RIGHT lower lobe pulmonary artery consistent with acute pulmonary emboli. Emboli extend to the lower lobe segmental pulmonary arteries on the RIGHT. Filling defect within the proximal lingula pulmonary artery as well as the segmental branches of the LEFT lower lobe pulmonary arteries. Distal segmental emboli in the LEFT upper lobe pulmonary artery. The RIGHT ventricular to LEFT ventricular ratio is greater than 1 (41 mm / 17 mm equals  2.4) No acute findings aorta great vessels.  No pericardial effusion. Mediastinum/Nodes: No axillary supraclavicular adenopathy. No mediastinal hilar adenopathy. No pericardial effusion. Esophagus. Lungs/Pleura: No pulmonary infarction.  No infiltrate or edema. Upper Abdomen: Limited view of the liver, kidneys, pancreas are unremarkable. Normal adrenal glands.  Probable small adenoma of the LEFT adrenal gland. No aggressive osseous lesion Musculoskeletal: No aggressive osseous lesion Review of the MIP images confirms the above findings. IMPRESSION: 1. Bilateral pulmonary emboli within the proximal and segmental branches of the RIGHT upper lobe and lower lobe as well as the segmental branches of the LEFT upper lobe and LEFT lower lobe. Overall clot burden is severe. 2. CT evidence of right heart strain (RV/LV Ratio = 2.4) consistent with at least submassive (intermediate risk) PE. The presence of right heart strain has been associated with an increased risk of morbidity and mortality. Please activate Code PE by paging [phone]. Critical Value/emergent results were called by telephone at the time of interpretation on 02/05/2018 at 9:04 pm to Dr. Carmon Sails , who verbally acknowledged these results. Electronically Signed   By: Suzy Bouchard M.D.   On: 02/05/2018 21:06    Procedures Procedures (including critical care time)  Medications Ordered in ED Medications  heparin bolus via infusion 3,000 Units (has no administration in time range)  heparin ADULT infusion 100 units/mL (25000 units/265mL sodium chloride 0.45%) (has no administration in time range)  sodium chloride 0.9 % injection (  Given by Other 02/05/18 2059)  iopamidol (ISOVUE-370) 76 % injection (  Contrast Given 02/05/18 2057)  iopamidol (ISOVUE-370) 76 % injection 80 mL (80 mLs Intravenous Contrast Given 02/05/18 2036)     Initial Impression / Assessment and Plan / ED Course  I have reviewed the triage vital signs and the nursing  notes.  Pertinent labs & imaging results that were available during my care of the patient were reviewed by me and considered in my medical decision making (see chart for details).  Clinical Course as of Feb 05 2125  Fri Feb 05, 2018  1921 Creatinine(!): 1.59 [CG]  1921 GFR, Est Non African American(!): 32 [CG]  1921 Troponin I(!!): 0.48 [CG]  2119 B Natriuretic Peptide(!): 728.5 [CG]  2119 D-Dimer, Quant(!): 11.62 [CG]  2120 IMPRESSION: 1. Bilateral pulmonary emboli within the proximal and segmental branches of the RIGHT upper lobe and lower lobe as well as the segmental branches of the LEFT upper lobe and LEFT lower lobe. Overall clot burden is severe. 2. CT evidence of right heart strain (RV/LV Ratio = 2.4) consistent with at least submassive (intermediate risk) PE. The presence of right heart strain has been associated with an increased risk of morbidity and mortality. Please activate Code PE by paging [phone].  Critical Value/emergent results were called by telephone at the time of interpretation on 02/05/2018 at 9:04 pm to Dr. Carmon Sails , who verbally acknowledged these results.   Electronically Signed By: Suzy Bouchard M.D. On: 02/05/2018 21:06  CT Angio Chest PE W and/or Wo Contrast [CG]    Clinical Course User Index [CG] Kinnie Feil, PA-C   Ddx includes acute CHF exacerbation. She has no fevers, cough, exertional or pleuritic CP putting ACS/MI, PNA, PE less likely. She does have h/o a-fib without anticoagulation.   2125: CTA remarkable for bilateral PEs with RV strain.  Heparin order started. Re-evaluated pt who is on 3 L [place] at 92-94%.  Well appearing, requesting food. Will consult hospitalist. Pt shared with Dr. Johnney Killian.  Final Clinical Impressions(s) / ED Diagnoses   Final diagnoses:  Other acute pulmonary embolism without acute cor pulmonale [facility])    ED Discharge Orders    None       Arlean Hopping 02/05/18 2126     Charlesetta Shanks, MD 02/06/18 1600

## 2018-02-05 NOTE — ED Notes (Signed)
ED TO INPATIENT HANDOFF REPORT  Name/Age/Gender Amber Medina 67 y.o. female  Code Status    Code Status Orders  (From admission, onward)        Start     Ordered   02/05/18 2158  Full code  Continuous     02/05/18 2159    Code Status History    Date Active Date Inactive Code Status Order ID Comments User Context   06/12/2017 0127 06/16/2017 2127 Full Code [id number]  Vianne Bulls, MD Inpatient   06/11/2017 1857 06/12/2017 0127 DNR [id number]  Oswald Hillock, MD Inpatient   09/08/2015 0917 09/15/2015 1635 Full Code [id number]  Jonetta Osgood, MD Inpatient   09/07/2015 0011 09/08/2015 0917 Full Code [id number]  Reubin Milan, MD Inpatient      Home/SNF/Other Home  Chief Complaint Shortness of Breath  Level of Care/Admitting Diagnosis ED Disposition    ED Disposition Condition [place]: [facility] [100102]  Level of Care: Stepdown [14]  Admit to SDU based on following criteria: Hemodynamic compromise or significant risk of instability:  Patient requiring short term acute titration and management of vasoactive drips, and invasive monitoring (i.e., CVP and Arterial line).  Diagnosis: Acute pulmonary embolism with acute cor pulmonale [facility]) [[id number]]  Admitting Physician: Doreatha Massed  Attending Physician: Etta Quill [phone]  Estimated length of stay: past midnight tomorrow  Certification:: I certify this patient will need inpatient services for at least 2 midnights  PT Class (Do Not Modify): Inpatient [101]  PT Acc Code (Do Not Modify): Private [1]       Medical History Past Medical History:  Diagnosis Date  . Abnormal pulmonary function test 02/2017  . Chronic diastolic (congestive) heart failure ([place]) 10/11/2015  . CKD (chronic kidney disease), stage II   . Colon cancer ([place])   . Microcytic anemia    by labs 02/2017 -> adm 06/2017 with severe anemia, found to have colon CA.  . Morbid obesity ([place])   . Nocturnal  hypoxemia   . Persistent atrial fibrillation ([place])   . Snoring   . Suspected sleep apnea     Allergies Allergies  Allergen Reactions  . Compazine [Prochlorperazine Edisylate] Swelling    IV Location/Drains/Wounds Patient Lines/Drains/Airways Status   Active Line/Drains/Airways    Name:   Placement date:   Placement time:   Site:   Days:   Peripheral IV 02/05/18 Right;Medial Forearm   02/05/18    1823    Forearm   less than 1          Labs/Imaging Results for orders placed or performed during the hospital encounter of 02/05/18 (from the past 48 hour(s))  Basic metabolic panel     Status: Abnormal   Collection Time: 02/05/18  6:25 PM  Result Value Ref Range   Sodium 137 135 - 145 mmol/L   Potassium 4.2 3.5 - 5.1 mmol/L   Chloride 102 101 - 111 mmol/L   CO2 21 (L) 22 - 32 mmol/L   Glucose, Bld 180 (H) 65 - 99 mg/dL   BUN 25 (H) 6 - 20 mg/dL   Creatinine, Ser 1.59 (H) 0.44 - 1.00 mg/dL   Calcium 9.0 8.9 - 10.3 mg/dL   GFR calc non Af Amer 32 (L) >60 mL/min   GFR calc Af Amer 37 (L) >60 mL/min    Comment: (NOTE) The eGFR has been calculated using the CKD EPI equation. This calculation has not been validated  in all clinical situations. eGFR's persistently <60 mL/min signify possible Chronic Kidney Disease.    Anion gap 14 5 - 15    Comment: Performed at [facility], [address] [place], [place] [address]  Troponin I     Status: Abnormal   Collection Time: 02/05/18  6:25 PM  Result Value Ref Range   Troponin I 0.48 (HH) <0.03 ng/mL    Comment: CRITICAL RESULT CALLED TO, READ BACK BY AND VERIFIED WITH: NJIHI,O AT 1917 ON 02/05/2018 BY MOSLEY,J Performed at [facility], [address] [place], [place] [address]   CBC with Differential     Status: Abnormal   Collection Time: 02/05/18  6:25 PM  Result Value Ref Range   WBC 11.4 (H) 4.0 - 10.5 K/uL   RBC 4.78 3.87 - 5.11 MIL/uL   Hemoglobin 14.3 12.0 - 15.0 g/dL   HCT 43.9 36.0  - 46.0 %   MCV 91.8 78.0 - 100.0 fL   MCH 29.9 26.0 - 34.0 pg   MCHC 32.6 30.0 - 36.0 g/dL   RDW 14.5 11.5 - 15.5 %   Platelets 213 150 - 400 K/uL   Neutrophils Relative % 82 %   Neutro Abs 9.3 (H) 1.7 - 7.7 K/uL   Lymphocytes Relative 12 %   Lymphs Abs 1.4 0.7 - 4.0 K/uL   Monocytes Relative 6 %   Monocytes Absolute 0.7 0.1 - 1.0 K/uL   Eosinophils Relative 0 %   Eosinophils Absolute 0.0 0.0 - 0.7 K/uL   Basophils Relative 0 %   Basophils Absolute 0.0 0.0 - 0.1 K/uL    Comment: Performed at [facility], [address] [place], [place] [address]  Brain natriuretic peptide     Status: Abnormal   Collection Time: 02/05/18  6:25 PM  Result Value Ref Range   B Natriuretic Peptide 728.5 (H) 0.0 - 100.0 pg/mL    Comment: Performed at [facility], [address] [place], [place] [address]  D-dimer, quantitative (not at [facility])     Status: Abnormal   Collection Time: 02/05/18  6:25 PM  Result Value Ref Range   D-Dimer, Quant 11.62 (H) 0.00 - 0.50 ug/mL-FEU    Comment: (NOTE) At the manufacturer cut-off of 0.50 ug/mL FEU, this assay has been documented to exclude PE with a sensitivity and negative predictive value of 97 to 99%.  At this time, this assay has not been approved by the FDA to exclude DVT/VTE. Results should be correlated with clinical presentation. Performed at  [facility], [address] [place],  [address]    Dg Chest 2 View  Result Date: 02/05/2018 CLINICAL DATA:  Short of breath EXAM: CHEST - 2 VIEW COMPARISON:  09/06/2015 FINDINGS: Limited by habitus and soft tissues. The heart appears slightly enlarged. There is probable mild vascular congestion. Small pleural effusions. Increased opacity at the left base on frontal view likely largely related to overlying soft tissue as no correlate seen on the lateral view. IMPRESSION: 1. Mild cardiomegaly with central congestion 2. Small pleural effusions.  Electronically Signed   By: Kim  Fujinaga M.D.   On: 02/05/2018 19:28   Ct Angio Chest Pe W And/or Wo Contrast  Result Date: 02/05/2018 CLINICAL DATA:  Short of breath. Increase in lower extremity swelling. Concern for pulmonary embolism. Positive D-dimer. EXAM: CT ANGIOGRAPHY CHEST WITH CONTRAST TECHNIQUE: Multidetector CT imaging of the chest was performed using the standard protocol during bolus administration of intravenous   contrast. Multiplanar CT image reconstructions and MIPs were obtained to evaluate the vascular anatomy. CONTRAST:  78m ISOVUE-370 IOPAMIDOL (ISOVUE-370) INJECTION 76% COMPARISON:  None. FINDINGS: Cardiovascular: Proximal filling defect within the RIGHT upper lobe pulmonary artery and RIGHT lower lobe pulmonary artery consistent with acute pulmonary emboli. Emboli extend to the lower lobe segmental pulmonary arteries on the RIGHT. Filling defect within the proximal lingula pulmonary artery as well as the segmental branches of the LEFT lower lobe pulmonary arteries. Distal segmental emboli in the LEFT upper lobe pulmonary artery. The RIGHT ventricular to LEFT ventricular ratio is greater than 1 (41 mm / 17 mm equals 2.4) No acute findings aorta great vessels.  No pericardial effusion. Mediastinum/Nodes: No axillary supraclavicular adenopathy. No mediastinal hilar adenopathy. No pericardial effusion. Esophagus. Lungs/Pleura: No pulmonary infarction.  No infiltrate or edema. Upper Abdomen: Limited view of the liver, kidneys, pancreas are unremarkable. Normal adrenal glands. Probable small adenoma of the LEFT adrenal gland. No aggressive osseous lesion Musculoskeletal: No aggressive osseous lesion Review of the MIP images confirms the above findings. IMPRESSION: 1. Bilateral pulmonary emboli within the proximal and segmental branches of the RIGHT upper lobe and lower lobe as well as the segmental branches of the LEFT upper lobe and LEFT lower lobe. Overall clot burden is severe. 2. CT evidence  of right heart strain (RV/LV Ratio = 2.4) consistent with at least submassive (intermediate risk) PE. The presence of right heart strain has been associated with an increased risk of morbidity and mortality. Please activate Code PE by paging [phone] Critical Value/emergent results were called by telephone at the time of interpretation on 02/05/2018 at 9:04 pm to Dr. CCarmon Sails, who verbally acknowledged these results. Electronically Signed   By: SSuzy BouchardM.D.   On: 02/05/2018 21:06    Pending Labs Unresulted Labs (From admission, onward)   Start     Ordered   02/06/18 0500  CBC  Daily,   R     02/05/18 2121   02/06/18 05621 Basic metabolic panel  Tomorrow morning,   R     02/05/18 2159   02/06/18 0330  Heparin level (unfractionated)  Once-Timed,   R     02/05/18 2121      Vitals/Pain Today's Vitals   02/05/18 1752 02/05/18 1818 02/05/18 1857 02/05/18 2117  BP: 132/71  108/79 124/61  Pulse: 78  74 73  Resp: _0 Temp: (!) 97.5 F (36.4 C)     TempSrc: Oral     SpO2: 93%  94% 94%  Weight: 300 lb (136.1 kg)     Height: 5' 7" (1.702 m)     PainSc:  0-No pain      Isolation Precautions No active isolations  Medications Medications  heparin ADULT infusion 100 units/mL (25000 units/2522msodium chloride 0.45%) (1,650 Units/hr Intravenous New Bag/Given 02/05/18 2147)  amiodarone (PACERONE) tablet 200 mg (has no administration in time range)  ferrous gluconate (FERGON) tablet 324 mg (has no administration in time range)  acetaminophen (TYLENOL) tablet 1,000 mg (has no administration in time range)  tetrahydrozoline 0.05 % ophthalmic solution 1-2 drop (has no administration in time range)  diltiazem (CARDIZEM CD) 24 hr capsule 240 mg (has no administration in time range)  metoprolol succinate (TOPROL-XL) 24 hr tablet 25 mg (has no administration in time range)  ondansetron (ZOFRAN) tablet 4 mg (has no administration in time range)    Or  ondansetron (ZOFRAN)  injection 4 mg (has no administration in time  range)  sodium chloride 0.9 % injection (  Given by Other 02/05/18 2059)  iopamidol (ISOVUE-370) 76 % injection (  Contrast Given 02/05/18 2057)  iopamidol (ISOVUE-370) 76 % injection 80 mL (80 mLs Intravenous Contrast Given 02/05/18 2036)  heparin bolus via infusion 3,000 Units (3,000 Units Intravenous Bolus from Bag 02/05/18 2148)    Mobility walks with person assist

## 2018-02-06 ENCOUNTER — Inpatient Hospital Stay (HOSPITAL_COMMUNITY): Payer: Medicare Other

## 2018-02-06 ENCOUNTER — Other Ambulatory Visit: Payer: Self-pay

## 2018-02-06 DIAGNOSIS — I361 Nonrheumatic tricuspid (valve) insufficiency: Secondary | ICD-10-CM

## 2018-02-06 DIAGNOSIS — R609 Edema, unspecified: Secondary | ICD-10-CM

## 2018-02-06 DIAGNOSIS — R0602 Shortness of breath: Secondary | ICD-10-CM

## 2018-02-06 LAB — GLUCOSE, CAPILLARY
GLUCOSE-CAPILLARY: 116 mg/dL — AB (ref 65–99)
GLUCOSE-CAPILLARY: 127 mg/dL — AB (ref 65–99)
Glucose-Capillary: 209 mg/dL — ABNORMAL HIGH (ref 65–99)

## 2018-02-06 LAB — BASIC METABOLIC PANEL
Anion gap: 12 (ref 5–15)
BUN: 28 mg/dL — ABNORMAL HIGH (ref 6–20)
CALCIUM: 9.4 mg/dL (ref 8.9–10.3)
CHLORIDE: 103 mmol/L (ref 101–111)
CO2: 22 mmol/L (ref 22–32)
CREATININE: 1.61 mg/dL — AB (ref 0.44–1.00)
GFR calc non Af Amer: 32 mL/min — ABNORMAL LOW (ref >60)
GFR, EST AFRICAN AMERICAN: 37 mL/min — AB (ref >60)
Glucose, Bld: 118 mg/dL — ABNORMAL HIGH (ref 65–99)
Potassium: 4.6 mmol/L (ref 3.5–5.1)
SODIUM: 137 mmol/L (ref 135–145)

## 2018-02-06 LAB — ECHOCARDIOGRAM COMPLETE
Height: 67 in
Weight: 4800 oz

## 2018-02-06 LAB — CBC
HCT: 44.6 % (ref 36.0–46.0)
Hemoglobin: 14.2 g/dL (ref 12.0–15.0)
MCH: 29.5 pg (ref 26.0–34.0)
MCHC: 31.8 g/dL (ref 30.0–36.0)
MCV: 92.7 fL (ref 78.0–100.0)
Platelets: 193 10*3/uL (ref 150–400)
RBC: 4.81 MIL/uL (ref 3.87–5.11)
RDW: 15.1 % (ref 11.5–15.5)
WBC: 11.1 10*3/uL — AB (ref 4.0–10.5)

## 2018-02-06 LAB — MRSA PCR SCREENING: MRSA BY PCR: NEGATIVE

## 2018-02-06 LAB — HEPARIN LEVEL (UNFRACTIONATED)
HEPARIN UNFRACTIONATED: 0.35 [IU]/mL (ref 0.30–0.70)
Heparin Unfractionated: 0.36 IU/mL (ref 0.30–0.70)

## 2018-02-06 MED ORDER — PANTOPRAZOLE SODIUM 40 MG PO TBEC
40.0000 mg | DELAYED_RELEASE_TABLET | Freq: Every day | ORAL | Status: DC
  Administered 2018-02-06 – 2018-02-16 (×11): 40 mg via ORAL
  Filled 2018-02-06 (×11): qty 1

## 2018-02-06 MED ORDER — HEPARIN (PORCINE) IN NACL 100-0.45 UNIT/ML-% IJ SOLN
1950.0000 [IU]/h | INTRAMUSCULAR | Status: AC
  Administered 2018-02-07 – 2018-02-08 (×2): 1950 [IU]/h via INTRAVENOUS
  Filled 2018-02-06 (×3): qty 250

## 2018-02-06 MED ORDER — HEPARIN (PORCINE) IN NACL 100-0.45 UNIT/ML-% IJ SOLN
1750.0000 [IU]/h | INTRAMUSCULAR | Status: DC
  Administered 2018-02-06 (×2): 1750 [IU]/h via INTRAVENOUS
  Filled 2018-02-06: qty 250

## 2018-02-06 NOTE — Progress Notes (Signed)
PULMONARY / CRITICAL CARE MEDICINE   Name: Omega Durante MRN: [id number] DOB: 1951/07/24    ADMISSION DATE:  02/05/2018   CHIEF COMPLAINT: Shortness of breath  HISTORY OF PRESENT ILLNESS:   Amber Medina is a 67 y.o. female with medical history significant of A.Fib, chronic diastolic CHF, morbid obesity, colon cancer causing GIB in Sept 2018 that she hasnt yet had removed surgically or followed up on due to insurance issues apparently.  Presents to ED with c/o SOB.  Patient reports she started getting short of breath yesterday. Has some DOE at baseline but much worse yesterday.  She noted with short walks she was starting to get short of breath. She reports that she tried to rest and temporarily felt improved but then got up to go to the bathroom and was quite winded. She reports she became diaphoretic. She denies she had chest pain at any point in time. 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 She noted the left leg was more swollen than the right.  CTA showed bilateral PE in the setting of malignancy.  Ultrasound Dopplers of the lower extremities showed DVT in the left popliteal.  Patient started on heparin drip and transferred to ICU for closer monitoring.   PAST MEDICAL HISTORY :  She  has a past medical history of Abnormal pulmonary function test (02/2017), Chronic diastolic (congestive) heart failure ([place]) (10/11/2015), CKD (chronic kidney disease), stage II, Colon cancer ([place]), Microcytic anemia, Morbid obesity ([place]), Nocturnal hypoxemia, Persistent atrial fibrillation ([place]), Snoring, and Suspected sleep apnea.  PAST SURGICAL HISTORY: She  has a past surgical history that includes Tonsillectomy; Colonoscopy with propofol (N/A, 06/13/2017); and Esophagogastroduodenoscopy (egd) with propofol (N/A, 06/13/2017).  Allergies  Allergen Reactions  . Compazine [Prochlorperazine Edisylate] Swelling    No current facility-administered medications on file  prior to encounter.    Current Outpatient Medications on File Prior to Encounter  Medication Sig  . acetaminophen (TYLENOL) 500 MG tablet Take 1,000 mg by mouth every 8 (eight) hours as needed for mild pain or moderate pain.  Marland Kitchen amiodarone (PACERONE) 200 MG tablet Take 1 tablet (200 mg total) by mouth daily.  . Ascorbic Acid (VITAMIN C PO) Take by mouth.  Marland Kitchen CALCIUM PO Take by mouth.  . Coenzyme Q10 (CO Q 10 PO) Take by mouth.  . diltiazem (CARDIZEM CD) 240 MG 24 hr capsule Take 1 capsule (240 mg total) by mouth daily.  . ferrous gluconate (FERGON) 324 MG tablet Take 1 tablet (324 mg total) by mouth 3 (three) times daily with meals.  . furosemide (LASIX) 40 MG tablet Take 1 tablet (40 mg total) by mouth daily.  . metoprolol succinate (TOPROL-XL) 25 MG 24 hr tablet take 1 tablet by mouth once daily  . Multiple Vitamins-Minerals (ALIVE WOMENS 50+ PO) Take by mouth.  . potassium chloride SA (K-DUR,KLOR-CON) 20 MEQ tablet Take 1 tablet (20 mEq total) 2 (two) times daily by mouth.  . Tetrahydrozoline HCl (VISINE OP) Place 1-2 drops into both eyes daily as needed (allergies/dry eye).  . TURMERIC PO Take by mouth.    FAMILY HISTORY:  Her indicated that her mother is deceased. She indicated that her father is deceased.   SOCIAL HISTORY: She  reports that she has quit smoking. She has never used smokeless tobacco. She reports that she drinks alcohol. She reports that she does not use drugs.  REVIEW OF SYSTEMS:   Patient denies chest pain, headache, dizziness, nausea, vomiting, abdominal  pain at this time.  Patient does have some mild shortness of breath.   VITAL SIGNS: BP (!) 129/56   Pulse 74   Temp 98 F (36.7 C) (Oral)   Resp 14   Ht 5\' 7"  (1.702 m)   Wt 300 lb (136.1 kg)   LMP  (LMP Unknown)   SpO2 97%   BMI 46.99 kg/m   HEMODYNAMICS:      INTAKE / OUTPUT: I/O last 3 completed shifts: In: 55.1 [I.V.:55.1] Out: -   PHYSICAL EXAMINATION: General: Elderly female appropriate  for her age lying in bed in no acute distress speaking in full sentences on 5 L nasal cannula saturating 95% Neuro: Alert awake oriented x3 extraocular muscles are intact pupils equal round reactive to light 5 out of 5 motor strength in all 4 extremities Cardiovascular: S1-S2 regular rate and rhythm Lungs: Fair air entry bilaterally no wheeze about the rhonchi Abdomen: Obesity obese abdomen soft nontender positive bowel quadrants Musculoskeletal: Positive pulses in all 4 extremities Skin: Intact  LABS:  BMET Recent Labs  Lab 02/05/18 1825 02/06/18 0324  NA 137 137  K 4.2 4.6  CL 102 103  CO2 21* 22  BUN 25* 28*  CREATININE 1.59* 1.61*  GLUCOSE 180* 118*    Electrolytes Recent Labs  Lab 02/05/18 1825 02/06/18 0324  CALCIUM 9.0 9.4    CBC Recent Labs  Lab 02/05/18 1825 02/06/18 0324  WBC 11.4* 11.1*  HGB 14.3 14.2  HCT 43.9 44.6  PLT 213 193    Coag's No results for input(s): APTT, INR in the last 168 hours.  Sepsis Markers No results for input(s): LATICACIDVEN, PROCALCITON, O2SATVEN in the last 168 hours.  ABG No results for input(s): PHART, PCO2ART, PO2ART in the last 168 hours.  Liver Enzymes No results for input(s): AST, ALT, ALKPHOS, BILITOT, ALBUMIN in the last 168 hours.  Cardiac Enzymes Recent Labs  Lab 02/05/18 1825  TROPONINI 0.48*    Glucose Recent Labs  Lab 02/05/18 2342 02/06/18 0322  GLUCAP 127* 116*    Imaging Dg Chest 2 View  Result Date: 02/05/2018 CLINICAL DATA:  Short of breath EXAM: CHEST - 2 VIEW COMPARISON:  09/06/2015 FINDINGS: Limited by habitus and soft tissues. The heart appears slightly enlarged. There is probable mild vascular congestion. Small pleural effusions. Increased opacity at the left base on frontal view likely largely related to overlying soft tissue as no correlate seen on the lateral view. IMPRESSION: 1. Mild cardiomegaly with central congestion 2. Small pleural effusions. Electronically Signed   By: Donavan Foil M.D.   On: 02/05/2018 19:28   Ct Angio Chest Pe W And/or Wo Contrast  Result Date: 02/05/2018 CLINICAL DATA:  Short of breath. Increase in lower extremity swelling. Concern for pulmonary embolism. Positive D-dimer. EXAM: CT ANGIOGRAPHY CHEST WITH CONTRAST TECHNIQUE: Multidetector CT imaging of the chest was performed using the standard protocol during bolus administration of intravenous contrast. Multiplanar CT image reconstructions and MIPs were obtained to evaluate the vascular anatomy. CONTRAST:  28mL ISOVUE-370 IOPAMIDOL (ISOVUE-370) INJECTION 76% COMPARISON:  None. FINDINGS: Cardiovascular: Proximal filling defect within the RIGHT upper lobe pulmonary artery and RIGHT lower lobe pulmonary artery consistent with acute pulmonary emboli. Emboli extend to the lower lobe segmental pulmonary arteries on the RIGHT. Filling defect within the proximal lingula pulmonary artery as well as the segmental branches of the LEFT lower lobe pulmonary arteries. Distal segmental emboli in the LEFT upper lobe pulmonary artery. The RIGHT ventricular to LEFT ventricular ratio is greater  than 1 (41 mm / 17 mm equals 2.4) No acute findings aorta great vessels.  No pericardial effusion. Mediastinum/Nodes: No axillary supraclavicular adenopathy. No mediastinal hilar adenopathy. No pericardial effusion. Esophagus. Lungs/Pleura: No pulmonary infarction.  No infiltrate or edema. Upper Abdomen: Limited view of the liver, kidneys, pancreas are unremarkable. Normal adrenal glands. Probable small adenoma of the LEFT adrenal gland. No aggressive osseous lesion Musculoskeletal: No aggressive osseous lesion Review of the MIP images confirms the above findings. IMPRESSION: 1. Bilateral pulmonary emboli within the proximal and segmental branches of the RIGHT upper lobe and lower lobe as well as the segmental branches of the LEFT upper lobe and LEFT lower lobe. Overall clot burden is severe. 2. CT evidence of right heart strain (RV/LV  Ratio = 2.4) consistent with at least submassive (intermediate risk) PE. The presence of right heart strain has been associated with an increased risk of morbidity and mortality. Please activate Code PE by paging [phone]. Critical Value/emergent results were called by telephone at the time of interpretation on 02/05/2018 at 9:04 pm to Dr. Carmon Sails , who verbally acknowledged these results. Electronically Signed   By: Suzy Bouchard M.D.   On: 02/05/2018 21:06     STUDIES:  02/05/2018 CTA chest 02/06/2018 echo 02/06/2018 ultrasound Doppler lower extremity  CULTURES: None  ANTIBIOTICS: None  SIGNIFICANT EVENTS:   LINES/TUBES: None  DISCUSSION: 67 year old female morbidly obese with history of colon cancer not treated presents with shortness of breath found to have bilateral PEs  ASSESSMENT / PLAN:  PULMONARY A: Acute hypoxic respiratory failure secondary to acute bilateral PEs in setting of malignancy P:   Patient is currently saturating 95% on 5 L nasal cannula CTA reviewed Started on heparin drip The patient is doing well for the next 24 hours we will consider possible downgrading from the ICU.  CARDIOVASCULAR A:  A. Fib Chronic diastolic heart failure P:  Follow-up echo Continue with p.o. Cardizem and amiodarone Currently on heparin drip for DVT and PE.  Patient needs to be on Xarelto in the past which was DC'd due to GI bleeding from colon cancer.  RENAL A: CKD P:   Creatinine stable at this time Monitor ins and outs Place electrolytes  GASTROINTESTINAL A:   Morbid obesity History of GI bleed secondary to untreated colon cancer P:   Advance diet as tolerated Protonix GI prophylaxis Patient will need to be seen by surgery for further evaluation and treatment of her colon cancer when she is more stable Patient will need a repeat CT abdomen pelvis for further work-up  HEMATOLOGIC A:   No acute issues at this time P:  Monitor hemoglobin and for any  signs of bleeding.  Patient currently on heparin drip.  Before discharge will need to have discussion with cardiology and possibly heme-onc for choice of anticoagulation for her PE DVT and A. fib  INFECTIOUS A:   No acute issues at this time P:   Monitor off antibiotics  ENDOCRINE A:    P:   Insulin sliding scale  NEUROLOGIC A:   No acute issues at this time P:      FAMILY  - Updates: Talked to the patient daughter over the phone.  Patient daughter is a Marine scientist.  Explained to her patient's current condition and medical management.  Patient daughter understands the patient doing well we will down with the patient in the next 24 hours    Jadzia Ibsen Marcello Moores D.O  Pulmonary and [place] Pager: [phone])  [phone]  02/06/2018, 4:51 PM

## 2018-02-06 NOTE — Progress Notes (Signed)
[facility] for IV heparin Indication: pulmonary embolus (multiple)  Allergies  Allergen Reactions  . Compazine [Prochlorperazine Edisylate] Swelling   Patient Measurements: Height: 5\' 7"  (170.2 cm) Weight: 300 lb (136.1 kg) IBW/kg (Calculated) : 61.6 Heparin Dosing Weight: 94.7 kg  Vital Signs: Temp: 98.5 F (36.9 C) (05/04 0800) Temp Source: Oral (05/04 0800) BP: 129/56 (05/04 1249) Pulse Rate: 74 (05/04 1249)  Labs: Recent Labs    02/05/18 1825 02/06/18 0324 02/06/18 1143  HGB 14.3 14.2  --   HCT 43.9 44.6  --   PLT 213 193  --   HEPARINUNFRC  --  0.35 0.36  CREATININE 1.59* 1.61*  --   TROPONINI 0.48*  --   --    Estimated Creatinine Clearance: 47.6 mL/min (A) (by C-G formula based on SCr of 1.61 mg/dL (H)).  Medical History: Past Medical History:  Diagnosis Date  . Abnormal pulmonary function test 02/2017  . Chronic diastolic (congestive) heart failure ([place]) 10/11/2015  . CKD (chronic kidney disease), stage II   . Colon cancer ([place])   . Microcytic anemia    by labs 02/2017 -> adm 06/2017 with severe anemia, found to have colon CA.  . Morbid obesity ([place])   . Nocturnal hypoxemia   . Persistent atrial fibrillation ([place])   . Snoring   . Suspected sleep apnea     Medications:  Medications Prior to Admission  Medication Sig Dispense Refill Last Dose  . acetaminophen (TYLENOL) 500 MG tablet Take 1,000 mg by mouth every 8 (eight) hours as needed for mild pain or moderate pain.   Past Week at Unknown time  . amiodarone (PACERONE) 200 MG tablet Take 1 tablet (200 mg total) by mouth daily. 90 tablet 2 02/05/2018 at Unknown time  . Ascorbic Acid (VITAMIN C PO) Take by mouth.   Past Week at Unknown time  . CALCIUM PO Take by mouth.   Past Week at Unknown time  . Coenzyme Q10 (CO Q 10 PO) Take by mouth.   02/04/2018 at Unknown time  . diltiazem (CARDIZEM CD) 240 MG 24 hr capsule Take 1 capsule (240 mg total) by mouth daily. 90 capsule 3  02/05/2018 at Unknown time  . ferrous gluconate (FERGON) 324 MG tablet Take 1 tablet (324 mg total) by mouth 3 (three) times daily with meals. 60 tablet 3 02/05/2018 at Unknown time  . furosemide (LASIX) 40 MG tablet Take 1 tablet (40 mg total) by mouth daily. 90 tablet 3 02/05/2018 at Unknown time  . metoprolol succinate (TOPROL-XL) 25 MG 24 hr tablet take 1 tablet by mouth once daily 90 tablet 3 02/05/2018 at 900 am  . Multiple Vitamins-Minerals (ALIVE WOMENS 50+ PO) Take by mouth.   Past Week at Unknown time  . potassium chloride SA (K-DUR,KLOR-CON) 20 MEQ tablet Take 1 tablet (20 mEq total) 2 (two) times daily by mouth. 180 tablet 2 02/05/2018 at Unknown time  . Tetrahydrozoline HCl (VISINE OP) Place 1-2 drops into both eyes daily as needed (allergies/dry eye).   Past Month at Unknown time  . TURMERIC PO Take by mouth.   Past Week at Unknown time   Scheduled:  . amiodarone  200 mg Oral Daily  . diltiazem  240 mg Oral Daily  . ferrous gluconate  324 mg Oral TID WC  . metoprolol succinate  25 mg Oral Daily   Infusions:  . heparin 1,750 Units/hr (02/06/18 1055)   Assessment: 40 yoF with Hx persistent Afib on rate/rhythm control  but not on anticoagulation, dCHF, morbid obesity, and colon cancer, presenting with SOB and hypoxia. D-dimer as well as BNP/troponin elevated. CTA reveals bilateral proximal and segmental pulmonary emboli with severe overall clot burden and and evidence of RH strain. Pharmacy consulted to start IV heparin.   Of note, patient admitted in September 2018 for diverticular bleed on Xarelto requiring 4 units PRBC. Xarelto was held at this time pending tumor resection for colon cancer. Appears patient was not able to complete follow-up and was not resumed on anticoagulation nor had any colon tumor resection.   Baseline INR, aPTT: none recent; previously had elevated INR last year due to being on Xarelto, but otherwise historically normal  Prior anticoagulation: none  Heparin 3000  unit bolus, infusion begun at 1650 units/hr  Today, 5/4  0324 HL=0.35 at goal, no bleeding or infusion issues per RN, rate increased to 1750 units/hr   1143 HL = 0.36, remains in goal range, on 1750 units/hr  Goal of Therapy: Heparin level 0.3-0.7 units/ml Monitor platelets by anticoagulation protocol: Yes  Plan:  Will increase rate again to to 1950 units/hr to ensure level remains therapeutic  Patient is a difficult stick for heparin levels  Daily CBC, daily Heparin level   Monitor for signs of bleeding or thrombosis  Minda Ditto PharmD Pager [phone] 02/06/2018, 1:21 PM

## 2018-02-06 NOTE — Progress Notes (Signed)
VASCULAR LAB PRELIMINARY  PRELIMINARY  PRELIMINARY  PRELIMINARY  Bilateral lower extremity venous duplex completed.    Preliminary report:  There is subacute DVT noted in the left popliteal vein.  Tierany Appleby, RVT 02/06/2018, 3:48 PM

## 2018-02-06 NOTE — Progress Notes (Signed)
  Echocardiogram 2D Echocardiogram has been performed.  Elster Corbello G Genevieve Arbaugh 02/06/2018, 9:17 AM

## 2018-02-06 NOTE — Progress Notes (Signed)
[facility] for IV heparin Indication: pulmonary embolus (multiple)  Allergies  Allergen Reactions  . Compazine [Prochlorperazine Edisylate] Swelling    Patient Measurements: Height: 5\' 7"  (170.2 cm) Weight: 300 lb (136.1 kg) IBW/kg (Calculated) : 61.6 Heparin Dosing Weight: using [place] nomogram for heparin dosing  Vital Signs: Temp: 99 F (37.2 C) (05/04 0347) Temp Source: Oral (05/04 0347) BP: 173/125 (05/04 0500) Pulse Rate: 66 (05/04 0500)  Labs: Recent Labs    02/05/18 1825 02/06/18 0324  HGB 14.3  --   HCT 43.9  --   PLT 213  --   HEPARINUNFRC  --  0.35  CREATININE 1.59* 1.61*  TROPONINI 0.48*  --     Estimated Creatinine Clearance: 47.6 mL/min (A) (by C-G formula based on SCr of 1.61 mg/dL (H)).   Medical History: Past Medical History:  Diagnosis Date  . Abnormal pulmonary function test 02/2017  . Chronic diastolic (congestive) heart failure ([place]) 10/11/2015  . CKD (chronic kidney disease), stage II   . Colon cancer ([place])   . Microcytic anemia    by labs 02/2017 -> adm 06/2017 with severe anemia, found to have colon CA.  . Morbid obesity ([place])   . Nocturnal hypoxemia   . Persistent atrial fibrillation ([place])   . Snoring   . Suspected sleep apnea     Medications:  Medications Prior to Admission  Medication Sig Dispense Refill Last Dose  . acetaminophen (TYLENOL) 500 MG tablet Take 1,000 mg by mouth every 8 (eight) hours as needed for mild pain or moderate pain.   Past Week at Unknown time  . amiodarone (PACERONE) 200 MG tablet Take 1 tablet (200 mg total) by mouth daily. 90 tablet 2 02/05/2018 at Unknown time  . Ascorbic Acid (VITAMIN C PO) Take by mouth.   Past Week at Unknown time  . CALCIUM PO Take by mouth.   Past Week at Unknown time  . Coenzyme Q10 (CO Q 10 PO) Take by mouth.   02/04/2018 at Unknown time  . diltiazem (CARDIZEM CD) 240 MG 24 hr capsule Take 1 capsule (240 mg total) by mouth daily. 90 capsule 3  02/05/2018 at Unknown time  . ferrous gluconate (FERGON) 324 MG tablet Take 1 tablet (324 mg total) by mouth 3 (three) times daily with meals. 60 tablet 3 02/05/2018 at Unknown time  . furosemide (LASIX) 40 MG tablet Take 1 tablet (40 mg total) by mouth daily. 90 tablet 3 02/05/2018 at Unknown time  . metoprolol succinate (TOPROL-XL) 25 MG 24 hr tablet take 1 tablet by mouth once daily 90 tablet 3 02/05/2018 at 900 am  . Multiple Vitamins-Minerals (ALIVE WOMENS 50+ PO) Take by mouth.   Past Week at Unknown time  . potassium chloride SA (K-DUR,KLOR-CON) 20 MEQ tablet Take 1 tablet (20 mEq total) 2 (two) times daily by mouth. 180 tablet 2 02/05/2018 at Unknown time  . Tetrahydrozoline HCl (VISINE OP) Place 1-2 drops into both eyes daily as needed (allergies/dry eye).   Past Month at Unknown time  . TURMERIC PO Take by mouth.   Past Week at Unknown time   Scheduled:  . amiodarone  200 mg Oral Daily  . diltiazem  240 mg Oral Daily  . ferrous gluconate  324 mg Oral TID WC  . metoprolol succinate  25 mg Oral Daily   Infusions:  . heparin 1,750 Units/hr (02/06/18 0500)   PRN:   Assessment: 76 yoF with Hx persistent Afib on rate/rhythm control but not  on anticoagulation, dCHF, morbid obesity, and colon cancer, presenting with SOB and hypoxia. D-dimer as well as BNP/troponin elevated. CTA reveals bilateral proximal and segmental pulmonary emboli with severe overall clot burden and and evidence of RH strain. Pharmacy consulted to start IV heparin.   Of note, patient admitted in September 2018 for diverticular bleed on Xarelto requiring 4 units PRBC. Xarelto was held at this time pending tumor resection for colon cancer. Appears patient was not able to complete follow-up and was not resumed on anticoagulation nor had any colon tumor resection.   Baseline INR, aPTT: none recent; previously had elevated INR last year due to being on Xarelto, but otherwise historically normal  Prior anticoagulation:  none  Significant events:  5/3  CBC: WNL  No bleeding or infusion issues per nursing  CrCl: 48 ml/min Today, 5/4  0324 HL=0.35 at goal, no bleeding or infusion issues per RN.  Goal of Therapy: Heparin level 0.3-0.7 units/ml Monitor platelets by anticoagulation protocol: Yes  Plan:  Will increase slightly to 1750 units/hr to ensure stays in range as bolus wears off  Check heparin level 6 hrs after start  Daily CBC, daily heparin level once stable  Monitor for signs of bleeding or thrombosis    Dorrene German 02/06/2018, 5:42 AM

## 2018-02-06 NOTE — Progress Notes (Signed)
PROGRESS NOTE [facility]   Amber Medina   QHU:[id number] DOB: 1951-07-13  DOA: 02/05/2018 PCP: [name], MD   Brief Narrative:  Amber Medina is a 67 year old female with medical history significant for A. fib, chronic diastolic CHF, morbid obesity, colon cancer diagnosed back in September 2018 has not had any treatment yet. Patient presented to the ED with shortness of breath for 2 weeks prior to admission.  Upon ED  evaluation patient was found to be hypoxic, d-dimer positive, BNP 700, CTA with bilateral pulmonary emboli and evidence of right heart strain.  Elevated creatinine to 1.5.  Patient was admitted with working diagnosis of PE in setting of malignancy.  Subjective: Patient seen and examined, report feeling significantly better.  Denies chest pain.  Some difficulty with taking deep breaths.  Assessment & Plan: Acute hypoxic respiratory failure due to acute bilateral pulmonary embolism In setting of malignancy from colon cancer. Continue heparin drip for 48 hours then transition to Lovenox will need 6 months of anticoagulation. Keep on telemetry monitor, echocardiogram with mild signs of right heart strain, mild systolic dysfunction noted on the right side. Lower extremity Doppler pending Wean oxygen as tolerated.  AKI Prerenal Patient hydrated, monitor renal function, holding Lasix. Encourage oral hydration  PAF Currently NSR Continue amiodarone, Cardizem and metoprolol Now anticoagulated with heparin drips  Adenocarcinoma of the colon Newly diagnosed with in September 2018 No treatment yet, apparently insurance issues and was not able to have surgery done. Will arrange outpatient follow-up as with new acute PE patient will not be going to or anytime soon. Hemoglobin stable no signs of anemia.  She is having bowel movement regularity.  Chronic diastolic CHF Echocardiogram shows grade 1 diastolic dysfunction with mild signs of right heart strain Holding Lasix due  to mild AKI  DVT prophylaxis: Heparin drip Code Status: Full code Family Communication: None at bedside Disposition Plan: Home in 2 days  Consultants:   None  Procedures:   Echocardiogram  Antimicrobials:  None   Objective: Vitals:   02/06/18 0500 02/06/18 0600 02/06/18 0700 02/06/18 0800  BP: (!) 173/125  (!) 171/76 (!) 159/71  Pulse: 66 64 67 72  Resp: (!) 23 18 (!) 23 20  Temp:    98.5 F (36.9 C)  TempSrc:    Oral  SpO2: 96% 94% 93% 91%  Weight:      Height:        Intake/Output Summary (Last 24 hours) at 02/06/2018 0900 Last data filed at 02/05/2018 2300 Gross per 24 hour  Intake 20.08 ml  Output -  Net 20.08 ml   Filed Weights   02/05/18 1752  Weight: 136.1 kg (300 lb)    Examination:  General exam: Appears calm and comfortable  HEENT: OP moist and clear Respiratory system: Distant breath sounds, good air entry.  No wheezing Cardiovascular system: S1-S2, RRR, no murmurs Gastrointestinal system: Obese, soft nontender nondistended, positive bowel sounds, no palpable mass Central nervous system: Alert and oriented. No focal neurological deficits. Extremities: No pedal edema.    Skin: No rashes, lesions or ulcers Psychiatry: Judgement and insight appear normal. Mood & affect appropriate.    Data Reviewed: I have personally reviewed following labs and imaging studies  CBC: Recent Labs  Lab 02/05/18 1825  WBC 11.4*  NEUTROABS 9.3*  HGB 14.3  HCT 43.9  MCV 91.8  PLT 465   Basic Metabolic Panel: Recent Labs  Lab 02/05/18 1825 02/06/18 0324  NA 137 137  K 4.2 4.6  CL  102 103  CO2 21* 22  GLUCOSE 180* 118*  BUN 25* 28*  CREATININE 1.59* 1.61*  CALCIUM 9.0 9.4   GFR: Estimated Creatinine Clearance: 47.6 mL/min (A) (by C-G formula based on SCr of 1.61 mg/dL (H)). Liver Function Tests: No results for input(s): AST, ALT, ALKPHOS, BILITOT, PROT, ALBUMIN in the last 168 hours. No results for input(s): LIPASE, AMYLASE in the last 168  hours. No results for input(s): AMMONIA in the last 168 hours. Coagulation Profile: No results for input(s): INR, PROTIME in the last 168 hours. Cardiac Enzymes: Recent Labs  Lab 02/05/18 1825  TROPONINI 0.48*   BNP (last 3 results) Recent Labs    06/11/17 1200 06/25/17 1147  PROBNP 574* 200   HbA1C: No results for input(s): HGBA1C in the last 72 hours. CBG: Recent Labs  Lab 02/05/18 2342 02/06/18 0322  GLUCAP 127* 116*   Lipid Profile: No results for input(s): CHOL, HDL, LDLCALC, TRIG, CHOLHDL, LDLDIRECT in the last 72 hours. Thyroid Function Tests: No results for input(s): TSH, T4TOTAL, FREET4, T3FREE, THYROIDAB in the last 72 hours. Anemia Panel: No results for input(s): VITAMINB12, FOLATE, FERRITIN, TIBC, IRON, RETICCTPCT in the last 72 hours. Sepsis Labs: No results for input(s): PROCALCITON, LATICACIDVEN in the last 168 hours.  Recent Results (from the past 240 hour(s))  MRSA PCR Screening     Status: None   Collection Time: 02/06/18  1:00 AM  Result Value Ref Range Status   MRSA by PCR NEGATIVE NEGATIVE Final    Comment:        The GeneXpert MRSA Assay (FDA approved for NASAL specimens only), is one component of a comprehensive MRSA colonization surveillance program. It is not intended to diagnose MRSA infection nor to guide or monitor treatment for MRSA infections. Performed at [facility], [place] [address], [place], [place] [address]       Radiology Studies: Dg Chest 2 View  Result Date: 02/05/2018 CLINICAL DATA:  Short of breath EXAM: CHEST - 2 VIEW COMPARISON:  09/06/2015 FINDINGS: Limited by habitus and soft tissues. The heart appears slightly enlarged. There is probable mild vascular congestion. Small pleural effusions. Increased opacity at the left base on frontal view likely largely related to overlying soft tissue as no correlate seen on the lateral view. IMPRESSION: 1. Mild cardiomegaly with central congestion 2. Small  pleural effusions. Electronically Signed   By: Donavan Foil M.D.   On: 02/05/2018 19:28   Ct Angio Chest Pe W And/or Wo Contrast  Result Date: 02/05/2018 CLINICAL DATA:  Short of breath. Increase in lower extremity swelling. Concern for pulmonary embolism. Positive D-dimer. EXAM: CT ANGIOGRAPHY CHEST WITH CONTRAST TECHNIQUE: Multidetector CT imaging of the chest was performed using the standard protocol during bolus administration of intravenous contrast. Multiplanar CT image reconstructions and MIPs were obtained to evaluate the vascular anatomy. CONTRAST:  46mL ISOVUE-370 IOPAMIDOL (ISOVUE-370) INJECTION 76% COMPARISON:  None. FINDINGS: Cardiovascular: Proximal filling defect within the RIGHT upper lobe pulmonary artery and RIGHT lower lobe pulmonary artery consistent with acute pulmonary emboli. Emboli extend to the lower lobe segmental pulmonary arteries on the RIGHT. Filling defect within the proximal lingula pulmonary artery as well as the segmental branches of the LEFT lower lobe pulmonary arteries. Distal segmental emboli in the LEFT upper lobe pulmonary artery. The RIGHT ventricular to LEFT ventricular ratio is greater than 1 (41 mm / 17 mm equals 2.4) No acute findings aorta great vessels.  No pericardial effusion. Mediastinum/Nodes: No axillary supraclavicular adenopathy. No mediastinal hilar  adenopathy. No pericardial effusion. Esophagus. Lungs/Pleura: No pulmonary infarction.  No infiltrate or edema. Upper Abdomen: Limited view of the liver, kidneys, pancreas are unremarkable. Normal adrenal glands. Probable small adenoma of the LEFT adrenal gland. No aggressive osseous lesion Musculoskeletal: No aggressive osseous lesion Review of the MIP images confirms the above findings. IMPRESSION: 1. Bilateral pulmonary emboli within the proximal and segmental branches of the RIGHT upper lobe and lower lobe as well as the segmental branches of the LEFT upper lobe and LEFT lower lobe. Overall clot burden is  severe. 2. CT evidence of right heart strain (RV/LV Ratio = 2.4) consistent with at least submassive (intermediate risk) PE. The presence of right heart strain has been associated with an increased risk of morbidity and mortality. Please activate Code PE by paging [phone]. Critical Value/emergent results were called by telephone at the time of interpretation on 02/05/2018 at 9:04 pm to Dr. Carmon Sails , who verbally acknowledged these results. Electronically Signed   By: Suzy Bouchard M.D.   On: 02/05/2018 21:06      Scheduled Meds: . amiodarone  200 mg Oral Daily  . diltiazem  240 mg Oral Daily  . ferrous gluconate  324 mg Oral TID WC  . metoprolol succinate  25 mg Oral Daily   Continuous Infusions: . heparin 1,750 Units/hr (02/06/18 0500)     LOS: 1 day    Time spent: Total of 35 minutes spent with pt, greater than 50% of which was spent in discussion of  treatment, counseling and coordination of care   Chipper Oman, MD Pager: Text Page via [email or web address]   If 7PM-7AM, please contact night-coverage [email or web address] 02/06/2018, 9:00 AM   Note - This record has been created using [employer]. Chart creation errors have been sought, but may not always have been located. Such creation errors do not reflect on the standard of medical care.

## 2018-02-07 LAB — BASIC METABOLIC PANEL
Anion gap: 10 (ref 5–15)
BUN: 26 mg/dL — AB (ref 6–20)
CHLORIDE: 104 mmol/L (ref 101–111)
CO2: 24 mmol/L (ref 22–32)
CREATININE: 1.39 mg/dL — AB (ref 0.44–1.00)
Calcium: 9.1 mg/dL (ref 8.9–10.3)
GFR calc non Af Amer: 38 mL/min — ABNORMAL LOW (ref >60)
GFR, EST AFRICAN AMERICAN: 44 mL/min — AB (ref >60)
Glucose, Bld: 107 mg/dL — ABNORMAL HIGH (ref 65–99)
Potassium: 4.4 mmol/L (ref 3.5–5.1)
Sodium: 138 mmol/L (ref 135–145)

## 2018-02-07 LAB — CBC
HCT: 43.6 % (ref 36.0–46.0)
HEMOGLOBIN: 13.7 g/dL (ref 12.0–15.0)
MCH: 29.3 pg (ref 26.0–34.0)
MCHC: 31.4 g/dL (ref 30.0–36.0)
MCV: 93.4 fL (ref 78.0–100.0)
PLATELETS: 200 10*3/uL (ref 150–400)
RBC: 4.67 MIL/uL (ref 3.87–5.11)
RDW: 14.8 % (ref 11.5–15.5)
WBC: 12 10*3/uL — ABNORMAL HIGH (ref 4.0–10.5)

## 2018-02-07 LAB — MAGNESIUM: MAGNESIUM: 2.1 mg/dL (ref 1.7–2.4)

## 2018-02-07 LAB — HEPARIN LEVEL (UNFRACTIONATED): HEPARIN UNFRACTIONATED: 0.49 [IU]/mL (ref 0.30–0.70)

## 2018-02-07 NOTE — Progress Notes (Signed)
PROGRESS NOTE [facility]   [name]   OXB:[id number] DOB: [birth year]  DOA: 02/05/2018 PCP: [name], MD   Brief Narrative:  Amber Medina is a 67 year old female with medical history significant for A. fib, chronic diastolic CHF, morbid obesity, colon cancer diagnosed back in September 2018 has not had any treatment yet. Patient presented to the ED with shortness of breath for 2 weeks prior to admission.  Upon ED  evaluation patient was found to be hypoxic, d-dimer positive, BNP 700, CTA with bilateral pulmonary emboli and evidence of right heart strain.  Elevated creatinine to 1.5.  Patient was admitted with working diagnosis of PE in setting of malignancy.  Subjective: Patient seen and examined doing much better, has no complaints today.  Decreasing oxygen supplementation.    Assessment & Plan: Acute hypoxic respiratory failure due to acute bilateral pulmonary embolism In setting of malignancy from colon cancer. Continue heparin drip for 24 more hours,  then transition to Lovenox vs Eliquis, will discuss with Oncology better which option, however patient would prefer not to inject her self Can transfer to telemetry bed . Echocardiogram with mild signs of right heart strain, mild systolic dysfunction noted on the right side. Lower extremity Doppler left DVT  Wean O2 as able   AKI Prerenal Creatinine improved with hydration Encourage oral hydration Avoid nephrotoxic agent and hypotension. Check renal function in a.m.  PAF Currently NSR Continue amiodarone, Cardizem and metoprolol Now anticoagulated with heparin drips Prior to admission was not on continuation due to GI bleed in the past.  Adenocarcinoma of the colon Newly diagnosed with in September 2018 No treatment yet, apparently insurance issues and was not able to have surgery done. Will arrange outpatient follow-up as with new acute PE patient will not be going to or anytime soon. Hemoglobin stable no signs of  anemia.  She is having bowel movement regularly   Chronic diastolic CHF Echocardiogram shows grade 1 diastolic dysfunction with mild signs of right heart strain No signs of fluid overload Holding Lasix due to mild AKI  DVT prophylaxis: Heparin drip Code Status: Full code Family Communication: None at bedside Disposition Plan: Home in a.m. if continues to be stable  Consultants:   None  Procedures:   Echocardiogram  Antimicrobials:  None   Objective: Vitals:   02/07/18 0600 02/07/18 0700 02/07/18 0735 02/07/18 0813  BP:   (!) 142/115 125/74  Pulse: 61  (!) 55 63  Resp: (!) 26  15 17   Temp:  98.6 F (37 C)    TempSrc:  Oral    SpO2: 92%  91% 93%  Weight:      Height:        Intake/Output Summary (Last 24 hours) at 02/07/2018 0841 Last data filed at 02/07/2018 0600 Gross per 24 hour  Intake 133.52 ml  Output 675 ml  Net -541.48 ml   Filed Weights   02/05/18 1752  Weight: 136.1 kg (300 lb)    Examination:  General: NAD, 2 L nasal cannula Cardiovascular: RRR, S1/S2 +, no rubs, no gallops Respiratory: Distant breath sounds due to body habitus, good air entry Abdominal: Soft, NT, ND.  Extremities: no edema  Data Reviewed: I have personally reviewed following labs and imaging studies  CBC: Recent Labs  Lab 02/05/18 1825 02/06/18 0324 02/07/18 0337  WBC 11.4* 11.1* 12.0*  NEUTROABS 9.3*  --   --   HGB 14.3 14.2 13.7  HCT 43.9 44.6 43.6  MCV 91.8 92.7 93.4  PLT 213 193  119   Basic Metabolic Panel: Recent Labs  Lab 02/05/18 1825 02/06/18 0324 02/07/18 0337  NA 137 137 138  K 4.2 4.6 4.4  CL 102 103 104  CO2 21* 22 24  GLUCOSE 180* 118* 107*  BUN 25* 28* 26*  CREATININE 1.59* 1.61* 1.39*  CALCIUM 9.0 9.4 9.1  MG  --   --  2.1   GFR: Estimated Creatinine Clearance: 55.1 mL/min (A) (by C-G formula based on SCr of 1.39 mg/dL (H)). Liver Function Tests: No results for input(s): AST, ALT, ALKPHOS, BILITOT, PROT, ALBUMIN in the last 168  hours. No results for input(s): LIPASE, AMYLASE in the last 168 hours. No results for input(s): AMMONIA in the last 168 hours. Coagulation Profile: No results for input(s): INR, PROTIME in the last 168 hours. Cardiac Enzymes: Recent Labs  Lab 02/05/18 1825  TROPONINI 0.48*   BNP (last 3 results) Recent Labs    06/11/17 1200 06/25/17 1147  PROBNP 574* 200   HbA1C: No results for input(s): HGBA1C in the last 72 hours. CBG: Recent Labs  Lab 02/05/18 2342 02/06/18 0322 02/06/18 2010  GLUCAP 127* 116* 209*   Lipid Profile: No results for input(s): CHOL, HDL, LDLCALC, TRIG, CHOLHDL, LDLDIRECT in the last 72 hours. Thyroid Function Tests: No results for input(s): TSH, T4TOTAL, FREET4, T3FREE, THYROIDAB in the last 72 hours. Anemia Panel: No results for input(s): VITAMINB12, FOLATE, FERRITIN, TIBC, IRON, RETICCTPCT in the last 72 hours. Sepsis Labs: No results for input(s): PROCALCITON, LATICACIDVEN in the last 168 hours.  Recent Results (from the past 240 hour(s))  MRSA PCR Screening     Status: None   Collection Time: 02/06/18  1:00 AM  Result Value Ref Range Status   MRSA by PCR NEGATIVE NEGATIVE Final    Comment:        The GeneXpert MRSA Assay (FDA approved for NASAL specimens only), is one component of a comprehensive MRSA colonization surveillance program. It is not intended to diagnose MRSA infection nor to guide or monitor treatment for MRSA infections. Performed at [facility], [place] [address], [place], [place] [address]       Radiology Studies: Dg Chest 2 View  Result Date: 02/05/2018 CLINICAL DATA:  Short of breath EXAM: CHEST - 2 VIEW COMPARISON:  09/06/2015 FINDINGS: Limited by habitus and soft tissues. The heart appears slightly enlarged. There is probable mild vascular congestion. Small pleural effusions. Increased opacity at the left base on frontal view likely largely related to overlying soft tissue as no correlate seen on the  lateral view. IMPRESSION: 1. Mild cardiomegaly with central congestion 2. Small pleural effusions. Electronically Signed   By: Donavan Foil M.D.   On: 02/05/2018 19:28   Ct Angio Chest Pe W And/or Wo Contrast  Result Date: 02/05/2018 CLINICAL DATA:  Short of breath. Increase in lower extremity swelling. Concern for pulmonary embolism. Positive D-dimer. EXAM: CT ANGIOGRAPHY CHEST WITH CONTRAST TECHNIQUE: Multidetector CT imaging of the chest was performed using the standard protocol during bolus administration of intravenous contrast. Multiplanar CT image reconstructions and MIPs were obtained to evaluate the vascular anatomy. CONTRAST:  92mL ISOVUE-370 IOPAMIDOL (ISOVUE-370) INJECTION 76% COMPARISON:  None. FINDINGS: Cardiovascular: Proximal filling defect within the RIGHT upper lobe pulmonary artery and RIGHT lower lobe pulmonary artery consistent with acute pulmonary emboli. Emboli extend to the lower lobe segmental pulmonary arteries on the RIGHT. Filling defect within the proximal lingula pulmonary artery as well as the segmental branches of the LEFT lower lobe pulmonary arteries.  Distal segmental emboli in the LEFT upper lobe pulmonary artery. The RIGHT ventricular to LEFT ventricular ratio is greater than 1 (41 mm / 17 mm equals 2.4) No acute findings aorta great vessels.  No pericardial effusion. Mediastinum/Nodes: No axillary supraclavicular adenopathy. No mediastinal hilar adenopathy. No pericardial effusion. Esophagus. Lungs/Pleura: No pulmonary infarction.  No infiltrate or edema. Upper Abdomen: Limited view of the liver, kidneys, pancreas are unremarkable. Normal adrenal glands. Probable small adenoma of the LEFT adrenal gland. No aggressive osseous lesion Musculoskeletal: No aggressive osseous lesion Review of the MIP images confirms the above findings. IMPRESSION: 1. Bilateral pulmonary emboli within the proximal and segmental branches of the RIGHT upper lobe and lower lobe as well as the segmental  branches of the LEFT upper lobe and LEFT lower lobe. Overall clot burden is severe. 2. CT evidence of right heart strain (RV/LV Ratio = 2.4) consistent with at least submassive (intermediate risk) PE. The presence of right heart strain has been associated with an increased risk of morbidity and mortality. Please activate Code PE by paging [phone]. Critical Value/emergent results were called by telephone at the time of interpretation on 02/05/2018 at 9:04 pm to Dr. Carmon Sails , who verbally acknowledged these results. Electronically Signed   By: Suzy Bouchard M.D.   On: 02/05/2018 21:06      Scheduled Meds: . amiodarone  200 mg Oral Daily  . diltiazem  240 mg Oral Daily  . ferrous gluconate  324 mg Oral TID WC  . metoprolol succinate  25 mg Oral Daily  . pantoprazole  40 mg Oral Daily   Continuous Infusions: . heparin 1,950 Units/hr (02/06/18 1403)     LOS: 2 days   Time spent: Total of 35 minutes spent with pt, greater than 50% of which was spent in discussion of  treatment, counseling and coordination of care  Chipper Oman, MD Pager: Text Page via [email or web address]   If 7PM-7AM, please contact night-coverage [email or web address] 02/07/2018, 8:41 AM   Note - This record has been created using [employer]. Chart creation errors have been sought, but may not always have been located. Such creation errors do not reflect on the standard of medical care.

## 2018-02-07 NOTE — Progress Notes (Signed)
Patient to transfer to [place] report given to [place] receiving nurse, all questions answered at this time.  Pt. VSS with no s/s of distress noted.  Patient stable at transfer.

## 2018-02-07 NOTE — Progress Notes (Signed)
[place] for IV heparin Indication: pulmonary embolus (multiple)  Allergies  Allergen Reactions  . Compazine [Prochlorperazine Edisylate] Swelling   Patient Measurements: Height: 5\' 7"  (170.2 cm) Weight: 300 lb (136.1 kg) IBW/kg (Calculated) : 61.6 Heparin Dosing Weight: 94.7 kg  Vital Signs: Temp: 98.7 F (37.1 C) (05/05 0408) Temp Source: Axillary (05/05 0408) BP: 126/63 (05/05 0000) Pulse Rate: 61 (05/05 0600)  Labs: Recent Labs    02/05/18 1825 02/06/18 0324 02/06/18 1143 02/07/18 0337  HGB 14.3 14.2  --  13.7  HCT 43.9 44.6  --  43.6  PLT 213 193  --  200  HEPARINUNFRC  --  0.35 0.36 0.49  CREATININE 1.59* 1.61*  --  1.39*  TROPONINI 0.48*  --   --   --    Estimated Creatinine Clearance: 55.1 mL/min (A) (by C-G formula based on SCr of 1.39 mg/dL (H)).  Medical History: Past Medical History:  Diagnosis Date  . Abnormal pulmonary function test 02/2017  . Chronic diastolic (congestive) heart failure ([place]) 10/11/2015  . CKD (chronic kidney disease), stage II   . Colon cancer ([place])   . Microcytic anemia    by labs 02/2017 -> adm 06/2017 with severe anemia, found to have colon CA.  . Morbid obesity ([place])   . Nocturnal hypoxemia   . Persistent atrial fibrillation ([place])   . Snoring   . Suspected sleep apnea    Medications:  Medications Prior to Admission  Medication Sig Dispense Refill Last Dose  . acetaminophen (TYLENOL) 500 MG tablet Take 1,000 mg by mouth every 8 (eight) hours as needed for mild pain or moderate pain.   Past Week at Unknown time  . amiodarone (PACERONE) 200 MG tablet Take 1 tablet (200 mg total) by mouth daily. 90 tablet 2 02/05/2018 at Unknown time  . Ascorbic Acid (VITAMIN C PO) Take by mouth.   Past Week at Unknown time  . CALCIUM PO Take by mouth.   Past Week at Unknown time  . Coenzyme Q10 (CO Q 10 PO) Take by mouth.   02/04/2018 at Unknown time  . diltiazem (CARDIZEM CD) 240 MG 24 hr capsule Take 1  capsule (240 mg total) by mouth daily. 90 capsule 3 02/05/2018 at Unknown time  . ferrous gluconate (FERGON) 324 MG tablet Take 1 tablet (324 mg total) by mouth 3 (three) times daily with meals. 60 tablet 3 02/05/2018 at Unknown time  . furosemide (LASIX) 40 MG tablet Take 1 tablet (40 mg total) by mouth daily. 90 tablet 3 02/05/2018 at Unknown time  . metoprolol succinate (TOPROL-XL) 25 MG 24 hr tablet take 1 tablet by mouth once daily 90 tablet 3 02/05/2018 at 900 am  . Multiple Vitamins-Minerals (ALIVE WOMENS 50+ PO) Take by mouth.   Past Week at Unknown time  . potassium chloride SA (K-DUR,KLOR-CON) 20 MEQ tablet Take 1 tablet (20 mEq total) 2 (two) times daily by mouth. 180 tablet 2 02/05/2018 at Unknown time  . Tetrahydrozoline HCl (VISINE OP) Place 1-2 drops into both eyes daily as needed (allergies/dry eye).   Past Month at Unknown time  . TURMERIC PO Take by mouth.   Past Week at Unknown time   Scheduled:  . amiodarone  200 mg Oral Daily  . diltiazem  240 mg Oral Daily  . ferrous gluconate  324 mg Oral TID WC  . metoprolol succinate  25 mg Oral Daily  . pantoprazole  40 mg Oral Daily   Infusions:  .  heparin 1,950 Units/hr (02/06/18 1403)   Assessment: 52 yoF with Hx persistent Afib on rate/rhythm control but not on anticoagulation, dCHF, morbid obesity, and colon cancer, presenting with SOB and hypoxia. D-dimer as well as BNP/troponin elevated. CTA reveals bilateral proximal and segmental pulmonary emboli with severe overall clot burden and and evidence of RH strain. Pharmacy consulted to start IV heparin.   Of note, patient admitted in September 2018 for diverticular bleed on Xarelto requiring 4 units PRBC. Xarelto was held at this time pending tumor resection for colon cancer. Appears patient was not able to complete follow-up and was not resumed on anticoagulation nor had any colon tumor resection.   Baseline INR, aPTT: none recent; previously had elevated INR last year due to being on  Xarelto, but otherwise historically normal  Prior anticoagulation: none  Heparin 3000 unit bolus, infusion begun at 1650 units/hr  Today, 5/4  Hep level 0.49 units/ml, remains in therapeutic range  Rate increases x2 yesterday  CBC wnl, stable  Goal of Therapy: Heparin level 0.3-0.7 units/ml Monitor platelets by anticoagulation protocol: Yes  Plan:  Continue Heparin at 1950 units/hr   Patient is a difficult stick for heparin levels  Daily CBC, daily Heparin level   Monitor for signs of bleeding or thrombosis  Minda Ditto PharmD Pager [phone] 02/07/2018, 7:01 AM

## 2018-02-08 DIAGNOSIS — I2699 Other pulmonary embolism without acute cor pulmonale: Secondary | ICD-10-CM

## 2018-02-08 DIAGNOSIS — N182 Chronic kidney disease, stage 2 (mild): Secondary | ICD-10-CM

## 2018-02-08 DIAGNOSIS — I82409 Acute embolism and thrombosis of unspecified deep veins of unspecified lower extremity: Secondary | ICD-10-CM

## 2018-02-08 DIAGNOSIS — I4891 Unspecified atrial fibrillation: Secondary | ICD-10-CM

## 2018-02-08 DIAGNOSIS — Z79899 Other long term (current) drug therapy: Secondary | ICD-10-CM

## 2018-02-08 DIAGNOSIS — E669 Obesity, unspecified: Secondary | ICD-10-CM

## 2018-02-08 DIAGNOSIS — D509 Iron deficiency anemia, unspecified: Secondary | ICD-10-CM

## 2018-02-08 DIAGNOSIS — I5032 Chronic diastolic (congestive) heart failure: Secondary | ICD-10-CM

## 2018-02-08 DIAGNOSIS — Z87891 Personal history of nicotine dependence: Secondary | ICD-10-CM

## 2018-02-08 DIAGNOSIS — C189 Malignant neoplasm of colon, unspecified: Secondary | ICD-10-CM

## 2018-02-08 LAB — HEPARIN LEVEL (UNFRACTIONATED): Heparin Unfractionated: 0.4 IU/mL (ref 0.30–0.70)

## 2018-02-08 LAB — CBC
HEMATOCRIT: 40.2 % (ref 36.0–46.0)
HEMOGLOBIN: 12.7 g/dL (ref 12.0–15.0)
MCH: 29.5 pg (ref 26.0–34.0)
MCHC: 31.6 g/dL (ref 30.0–36.0)
MCV: 93.3 fL (ref 78.0–100.0)
Platelets: 207 10*3/uL (ref 150–400)
RBC: 4.31 MIL/uL (ref 3.87–5.11)
RDW: 14.5 % (ref 11.5–15.5)
WBC: 11.7 10*3/uL — ABNORMAL HIGH (ref 4.0–10.5)

## 2018-02-08 MED ORDER — ENOXAPARIN SODIUM 150 MG/ML ~~LOC~~ SOLN
150.0000 mg | Freq: Two times a day (BID) | SUBCUTANEOUS | Status: DC
  Administered 2018-02-08 – 2018-02-16 (×16): 150 mg via SUBCUTANEOUS
  Filled 2018-02-08 (×18): qty 1

## 2018-02-08 MED ORDER — ENOXAPARIN (LOVENOX) PATIENT EDUCATION KIT
PACK | Freq: Once | Status: AC
Start: 2018-02-08 — End: 2018-02-08
  Administered 2018-02-08: 12:00:00
  Filled 2018-02-08: qty 1

## 2018-02-08 NOTE — Progress Notes (Signed)
[place] for IV heparin Indication: pulmonary embolus (multiple)  Allergies  Allergen Reactions  . Compazine [Prochlorperazine Edisylate] Swelling   Patient Measurements: Height: 5\' 7"  (170.2 cm) Weight: (!) 322 lb 8.5 oz (146.3 kg) IBW/kg (Calculated) : 61.6 Heparin Dosing Weight: 94.7 kg  Vital Signs: Temp: 98 F (36.7 C) (05/06 0530) Temp Source: Oral (05/06 0530) BP: 113/52 (05/06 0530) Pulse Rate: 60 (05/06 0530)  Labs: Recent Labs    02/05/18 1825  02/06/18 0324 02/06/18 1143 02/07/18 0337 02/08/18 0530  HGB 14.3  --  14.2  --  13.7 12.7  HCT 43.9  --  44.6  --  43.6 40.2  PLT 213  --  193  --  200 207  HEPARINUNFRC  --    < > 0.35 0.36 0.49 0.40  CREATININE 1.59*  --  1.61*  --  1.39*  --   TROPONINI 0.48*  --   --   --   --   --    < > = values in this interval not displayed.   Estimated Creatinine Clearance: 57.6 mL/min (A) (by C-G formula based on SCr of 1.39 mg/dL (H)).  Medical History: Past Medical History:  Diagnosis Date  . Abnormal pulmonary function test 02/2017  . Chronic diastolic (congestive) heart failure ([place]) 10/11/2015  . CKD (chronic kidney disease), stage II   . Colon cancer ([place])   . Microcytic anemia    by labs 02/2017 -> adm 06/2017 with severe anemia, found to have colon CA.  . Morbid obesity ([place])   . Nocturnal hypoxemia   . Persistent atrial fibrillation ([place])   . Snoring   . Suspected sleep apnea    Medications:  Medications Prior to Admission  Medication Sig Dispense Refill Last Dose  . acetaminophen (TYLENOL) 500 MG tablet Take 1,000 mg by mouth every 8 (eight) hours as needed for mild pain or moderate pain.   Past Week at Unknown time  . amiodarone (PACERONE) 200 MG tablet Take 1 tablet (200 mg total) by mouth daily. 90 tablet 2 02/05/2018 at Unknown time  . Ascorbic Acid (VITAMIN C PO) Take by mouth.   Past Week at Unknown time  . CALCIUM PO Take by mouth.   Past Week at Unknown time  .  Coenzyme Q10 (CO Q 10 PO) Take by mouth.   02/04/2018 at Unknown time  . diltiazem (CARDIZEM CD) 240 MG 24 hr capsule Take 1 capsule (240 mg total) by mouth daily. 90 capsule 3 02/05/2018 at Unknown time  . ferrous gluconate (FERGON) 324 MG tablet Take 1 tablet (324 mg total) by mouth 3 (three) times daily with meals. 60 tablet 3 02/05/2018 at Unknown time  . furosemide (LASIX) 40 MG tablet Take 1 tablet (40 mg total) by mouth daily. 90 tablet 3 02/05/2018 at Unknown time  . metoprolol succinate (TOPROL-XL) 25 MG 24 hr tablet take 1 tablet by mouth once daily 90 tablet 3 02/05/2018 at 900 am  . Multiple Vitamins-Minerals (ALIVE WOMENS 50+ PO) Take by mouth.   Past Week at Unknown time  . potassium chloride SA (K-DUR,KLOR-CON) 20 MEQ tablet Take 1 tablet (20 mEq total) 2 (two) times daily by mouth. 180 tablet 2 02/05/2018 at Unknown time  . Tetrahydrozoline HCl (VISINE OP) Place 1-2 drops into both eyes daily as needed (allergies/dry eye).   Past Month at Unknown time  . TURMERIC PO Take by mouth.   Past Week at Unknown time   Scheduled:  .  amiodarone  200 mg Oral Daily  . diltiazem  240 mg Oral Daily  . ferrous gluconate  324 mg Oral TID WC  . metoprolol succinate  25 mg Oral Daily  . pantoprazole  40 mg Oral Daily   Infusions:  . heparin 1,950 Units/hr (02/08/18 0219)   Assessment: 60 yoF with Hx persistent Afib on rate/rhythm control but not on anticoagulation, dCHF, morbid obesity, and colon cancer, presenting with SOB and hypoxia. D-dimer as well as BNP/troponin elevated. CTA reveals bilateral proximal and segmental pulmonary emboli with severe overall clot burden and and evidence of RH strain. Pharmacy consulted to start IV heparin.   Of note, patient admitted in September 2018 for diverticular bleed on Xarelto requiring 4 units PRBC. Xarelto was held at this time pending tumor resection for colon cancer. Appears patient was not able to complete follow-up and was not resumed on anticoagulation nor  had any colon tumor resection.   Baseline INR, aPTT: none recent; previously had elevated INR last year due to being on Xarelto, but otherwise historically normal  Prior anticoagulation: none  Heparin 3000 unit bolus, infusion begun at 1650 units/hr  Today, 5/4  Heparin level continues to be therapeutic on current rate of 1950 units/hr  CBC wnl, stable  No reported bleeding  Goal of Therapy: Heparin level 0.3-0.7 units/ml Monitor platelets by anticoagulation protocol: Yes  Plan:  Continue Heparin at 1950 units/hr   Patient is a difficult stick for heparin levels  Daily CBC, daily Heparin level   Monitor for signs of bleeding or thrombosis   Adrian Saran, PharmD, BCPS Pager [phone] 02/08/2018 7:31 AM

## 2018-02-08 NOTE — Progress Notes (Signed)
HEMATOLOGY/ONCOLOGY CONSULTATION NOTE  Date of Service: 02/08/2018  Patient Care Team: [name], MD as PCP - General (Internal Medicine)  CHIEF COMPLAINTS/PURPOSE OF CONSULTATION:  Pulmonary embolism K/h/o colon cancer - untreated thus far  HISTORY OF PRESENTING ILLNESS:   Amber Medina is a wonderful 67 y.o. female who has been referred to [place] by Dr. Doreatha Lew for evaluation and management of pulmonary embolism and colon cancer. The pt reports that she is doing well overall.   The pt notes that four nights ago she felt really warm, began swelling, developed leg swelling, and became acutely SOB. She appeared to the ED on 02/05/18 and these symptoms were subsequently worked up with a CTA Chest and leg [place] which revealed several b/l pulmonary embolism and a DVT. She denies taking steroids or hormones prior to this visit. She notes that she has begun her Lovenox injections.   She previously presented to the ED on 06/11/17 due to iron deficiency anemia and GI bleeding and was diagnosed with colon cancer. She notes that she did not have any symptoms other than light headedness, and was found to have Hgb of 5.4. She had a colonoscopy and biopsy that showed invasive colon adenocarcinoma in ascending colon and CEA level of 2.8. No overt metastatic disease was noted on CT abd/pelvis and she was planning a surgery with Dr. Leighton Ruff, however her insurance coverage proved problematic and she was not able to follow up with this.  She also notes that her insurance problems have resolved recently. She has not yet seen an oncologist.  She was taken off of Xarelto (for afib) at the time. She notes that she has taken PO iron pills daily since September. She notes that her PCP is [name], MD. She notes that [facility] is not within her insurance network. She is not sure what Hem/Onc is in-network for her.   She did have a rpt CT abd/pelvis on 02/09/2018 which showed No evidence for metastatic  disease in the abdomen or pelvis on this noncontrast exam. Rpt CEA levels are pending.  The pt also notes that in 2016 she had CHF. She also notes having Afib. She endorses concern for sleep apnea, but notes that she has too difficult a time with the CPAP to use it regularly.   The pt notes that she lives in [place] but is often brought back to [place] to take care of her late parent's home.    Most recent lab results (02/08/18) of CBC  is as follows: all values are WNL except for WBC at 11.7k.  On review of systems, pt reports moving her bowels well, and denies blood in the stools, constipation, abdominal pains, mucous in the stools, black stools, weight change in the last 6 months, fevers, chills, regular night sweats, chest pain, and any other symptoms.   On PMHx the pt reports CHF in 2016, Afib, colon cancer. On Social Hx the pt reports that she smoked until December 2016. She denies any chemical exposures.  On Family Hx the pt reports paternal kidney problems, maternal diabetes and cervical cancer, and denies any other symptoms   MEDICAL HISTORY:  Past Medical History:  Diagnosis Date  . Abnormal pulmonary function test 02/2017  . Chronic diastolic (congestive) heart failure ([place]) 10/11/2015  . CKD (chronic kidney disease), stage II   . Colon cancer ([place])   . Microcytic anemia    by labs 02/2017 -> adm 06/2017 with severe anemia, found to have colon  CA.  . Morbid obesity ([place])   . Nocturnal hypoxemia   . Persistent atrial fibrillation ([place])   . Snoring   . Suspected sleep apnea     SURGICAL HISTORY: Past Surgical History:  Procedure Laterality Date  . COLONOSCOPY WITH PROPOFOL N/A 06/13/2017   Procedure: COLONOSCOPY WITH PROPOFOL;  Surgeon: Carol Ada, MD;  Location: [facility];  Service: Endoscopy;  Laterality: N/A;  . ESOPHAGOGASTRODUODENOSCOPY (EGD) WITH PROPOFOL N/A 06/13/2017   Procedure: ESOPHAGOGASTRODUODENOSCOPY (EGD) WITH PROPOFOL;  Surgeon: Carol Ada, MD;   Location: [facility];  Service: Endoscopy;  Laterality: N/A;  . TONSILLECTOMY      SOCIAL HISTORY: Social History   Socioeconomic History  . Marital status: Single    Spouse name: Not on file  . Number of children: Not on file  . Years of education: Not on file  . Highest education level: Not on file  Occupational History  . Not on file  Social Needs  . Financial resource strain: Not on file  . Food insecurity:    Worry: Not on file    Inability: Not on file  . Transportation needs:    Medical: Not on file    Non-medical: Not on file  Tobacco Use  . Smoking status: Former Research scientist (life sciences)  . Smokeless tobacco: Never Used  Substance and Sexual Activity  . Alcohol use: Yes  . Drug use: No  . Sexual activity: Not on file  Lifestyle  . Physical activity:    Days per week: Not on file    Minutes per session: Not on file  . Stress: Not on file  Relationships  . Social connections:    Talks on phone: Not on file    Gets together: Not on file    Attends religious service: Not on file    Active member of club or organization: Not on file    Attends meetings of clubs or organizations: Not on file    Relationship status: Not on file  . Intimate partner violence:    Fear of current or ex partner: Not on file    Emotionally abused: Not on file    Physically abused: Not on file    Forced sexual activity: Not on file  Other Topics Concern  . Not on file  Social History Narrative  . Not on file    FAMILY HISTORY: Family History  Problem Relation Age of Onset  . Diabetes Mellitus II Mother   . Hypertension Mother   . Congenital heart disease Mother   . Hypertension Father     ALLERGIES:  is allergic to compazine [prochlorperazine edisylate].  MEDICATIONS:  Current Facility-Administered Medications  Medication Dose Route Frequency Provider Last Rate Last Dose  . acetaminophen (TYLENOL) tablet 1,000 mg  1,000 mg Oral Q8H PRN Patrecia Pour, Christean Grief, MD      . amiodarone  (PACERONE) tablet 200 mg  200 mg Oral Daily Patrecia Pour, Christean Grief, MD   200 mg at 02/08/18 1001  . diltiazem (CARDIZEM CD) 24 hr capsule 240 mg  240 mg Oral Daily Patrecia Pour, Christean Grief, MD   240 mg at 02/08/18 1001  . enoxaparin (LOVENOX) injection 150 mg  150 mg Subcutaneous Q12H Patrecia Pour, Christean Grief, MD      . ferrous gluconate [facility]) tablet 324 mg  324 mg Oral TID WC Patrecia Pour, Christean Grief, MD   324 mg at 02/08/18 1000  . heparin ADULT infusion 100 units/mL (25000 units/245mL sodium chloride 0.45%)  1,950 Units/hr Intravenous Continuous Doreatha Lew, MD  19.5 mL/hr at 02/08/18 0219 1,950 Units/hr at 02/08/18 0219  . metoprolol succinate (TOPROL-XL) 24 hr tablet 25 mg  25 mg Oral Daily Patrecia Pour, Christean Grief, MD   25 mg at 02/08/18 1000  . ondansetron (ZOFRAN) tablet 4 mg  4 mg Oral Q6H PRN Patrecia Pour, Christean Grief, MD       Or  . ondansetron [facility]) injection 4 mg  4 mg Intravenous Q6H PRN Patrecia Pour, Christean Grief, MD      . pantoprazole (PROTONIX) EC tablet 40 mg  40 mg Oral Daily Patrecia Pour, Christean Grief, MD   40 mg at 02/08/18 1001  . tetrahydrozoline 0.05 % ophthalmic solution 1-2 drop  1-2 drop Both Eyes Daily PRN Patrecia Pour, Christean Grief, MD        REVIEW OF SYSTEMS:    10 Point review of Systems was done is negative except as noted above.  PHYSICAL EXAMINATION: ECOG PERFORMANCE STATUS: 2 - Symptomatic, <50% confined to bed  . Vitals:   02/08/18 0530 02/08/18 1318  BP: (!) 113/52 (!) 113/55  Pulse: 60 61  Resp: 18 16  Temp: 98 F (36.7 C) 98.3 F (36.8 C)  SpO2: 95% 93%   Filed Weights   02/05/18 1752 02/07/18 1326  Weight: 300 lb (136.1 kg) (!) 322 lb 8.5 oz (146.3 kg)   .Body mass index is 50.52 kg/m.  GENERAL:alert, in no acute distress and comfortable SKIN: no acute rashes, no significant lesions EYES: conjunctiva are pink and non-injected, sclera anicteric OROPHARYNX: MMM, no exudates, no oropharyngeal erythema or ulceration NECK: supple, no JVD LYMPH:  no palpable lymphadenopathy  in the cervical, axillary or inguinal regions LUNGS: clear to auscultation b/l with normal respiratory effort HEART: irreg ABDOMEN:  Obese, normoactive/distant bowel sounds , non tender, not distended. Extremity: 2+ b/l calf swelling and pedal edema PSYCH: alert & oriented x 3 with fluent speech NEURO: no focal motor/sensory deficits  LABORATORY DATA:  I have reviewed the data as listed  . CBC Latest Ref Rng & Units 02/10/2018 02/09/2018 02/08/2018  WBC 4.0 - 10.5 K/uL 11.2(H) 11.3(H) 11.7(H)  Hemoglobin 12.0 - 15.0 g/dL 12.4 12.3 12.7  Hematocrit 36.0 - 46.0 % 39.6 39.2 40.2  Platelets 150 - 400 K/uL 222 205 207    . CMP Latest Ref Rng & Units 02/07/2018 02/06/2018 02/05/2018  Glucose 65 - 99 mg/dL 107(H) 118(H) 180(H)  BUN 6 - 20 mg/dL 26(H) 28(H) 25(H)  Creatinine 0.44 - 1.00 mg/dL 1.39(H) 1.61(H) 1.59(H)  Sodium 135 - 145 mmol/L 138 137 137  Potassium 3.5 - 5.1 mmol/L 4.4 4.6 4.2  Chloride 101 - 111 mmol/L 104 103 102  CO2 22 - 32 mmol/L 24 22 21(L)  Calcium 8.9 - 10.3 mg/dL 9.1 9.4 9.0  Total Protein 6.5 - 8.1 g/dL - - -  Total Bilirubin 0.3 - 1.2 mg/dL - - -  Alkaline Phos 38 - 126 U/L - - -  AST 15 - 41 U/L - - -  ALT 14 - 54 U/L - - -   06/16/17 Pathology Report    RADIOGRAPHIC STUDIES: I have personally reviewed the radiological images as listed and agreed with the findings in the report. Ct Abdomen Pelvis Wo Contrast  Result Date: 02/09/2018 CLINICAL DATA:  Colon cancer. EXAM: CT ABDOMEN AND PELVIS WITHOUT CONTRAST TECHNIQUE: Multidetector CT imaging of the abdomen and pelvis was performed following the standard protocol without IV contrast. COMPARISON:  06/11/2017. FINDINGS: Lower chest: Subsegmental atelectasis noted posterior right lower lobe. Hepatobiliary: The liver shows diffusely  decreased attenuation suggesting steatosis. No focal abnormality identified within the liver parenchyma on this noncontrast exam. Tiny calcified gallstones evident. No intrahepatic or  extrahepatic biliary dilation. Pancreas: No focal mass lesion. No dilatation of the main duct. No intraparenchymal cyst. No peripancreatic edema. Spleen: No splenomegaly. No focal mass lesion. Adrenals/Urinary Tract: Similar appearance bilateral adrenal nodules measuring 17 mm on the right and 18 mm on the left. No hydronephrosis evident within either kidney. Stable 10 mm hypoattenuating lesion in the upper pole the right kidney. No evidence for hydroureter. The urinary bladder appears normal for the degree of distention. Stomach/Bowel: Stomach is nondistended. No gastric wall thickening. No evidence of outlet obstruction. Duodenum is normally positioned as is the ligament of Treitz. No small bowel wall thickening. No small bowel dilatation. The terminal ileum is normal. The appendix is normal. Diverticuli are seen scattered along the entire length of the colon without CT findings of diverticulitis. Vascular/Lymphatic: There is abdominal aortic atherosclerosis without aneurysm. There is no gastrohepatic or hepatoduodenal ligament lymphadenopathy. No intraperitoneal or retroperitoneal lymphadenopathy. No pelvic sidewall lymphadenopathy. Reproductive: Prominent low-attenuation again noted in the central cervix. Focal contour bulge right uterine fundus compatible with fibroid. Endometrium prominent in a postmenopausal female. There is no adnexal mass. Other: No intraperitoneal free fluid. Musculoskeletal: Bone windows reveal no worrisome lytic or sclerotic osseous lesions. IMPRESSION: 1. No evidence for metastatic disease in the abdomen or pelvis on this noncontrast exam. 2. Apparent thickening of the cervical canal with similar appearance of prominent endometrium. Pelvic ultrasound recommended to further evaluate. 3. Cholelithiasis 4. Hepatic steatosis. 5. Stable small bilateral adrenal nodules. Attention on follow-up recommended. 6.  Aortic Atherosclerois (ICD10-170.0) Electronically Signed   By: Misty Stanley M.D.    On: 02/09/2018 15:02   Dg Chest 2 View  Result Date: 02/05/2018 CLINICAL DATA:  Short of breath EXAM: CHEST - 2 VIEW COMPARISON:  09/06/2015 FINDINGS: Limited by habitus and soft tissues. The heart appears slightly enlarged. There is probable mild vascular congestion. Small pleural effusions. Increased opacity at the left base on frontal view likely largely related to overlying soft tissue as no correlate seen on the lateral view. IMPRESSION: 1. Mild cardiomegaly with central congestion 2. Small pleural effusions. Electronically Signed   By: Donavan Foil M.D.   On: 02/05/2018 19:28   Ct Angio Chest Pe W And/or Wo Contrast  Result Date: 02/05/2018 CLINICAL DATA:  Short of breath. Increase in lower extremity swelling. Concern for pulmonary embolism. Positive D-dimer. EXAM: CT ANGIOGRAPHY CHEST WITH CONTRAST TECHNIQUE: Multidetector CT imaging of the chest was performed using the standard protocol during bolus administration of intravenous contrast. Multiplanar CT image reconstructions and MIPs were obtained to evaluate the vascular anatomy. CONTRAST:  53mL ISOVUE-370 IOPAMIDOL (ISOVUE-370) INJECTION 76% COMPARISON:  None. FINDINGS: Cardiovascular: Proximal filling defect within the RIGHT upper lobe pulmonary artery and RIGHT lower lobe pulmonary artery consistent with acute pulmonary emboli. Emboli extend to the lower lobe segmental pulmonary arteries on the RIGHT. Filling defect within the proximal lingula pulmonary artery as well as the segmental branches of the LEFT lower lobe pulmonary arteries. Distal segmental emboli in the LEFT upper lobe pulmonary artery. The RIGHT ventricular to LEFT ventricular ratio is greater than 1 (41 mm / 17 mm equals 2.4) No acute findings aorta great vessels.  No pericardial effusion. Mediastinum/Nodes: No axillary supraclavicular adenopathy. No mediastinal hilar adenopathy. No pericardial effusion. Esophagus. Lungs/Pleura: No pulmonary infarction.  No infiltrate or edema.  Upper Abdomen: Limited view of the liver, kidneys, pancreas are  unremarkable. Normal adrenal glands. Probable small adenoma of the LEFT adrenal gland. No aggressive osseous lesion Musculoskeletal: No aggressive osseous lesion Review of the MIP images confirms the above findings. IMPRESSION: 1. Bilateral pulmonary emboli within the proximal and segmental branches of the RIGHT upper lobe and lower lobe as well as the segmental branches of the LEFT upper lobe and LEFT lower lobe. Overall clot burden is severe. 2. CT evidence of right heart strain (RV/LV Ratio = 2.4) consistent with at least submassive (intermediate risk) PE. The presence of right heart strain has been associated with an increased risk of morbidity and mortality. Please activate Code PE by paging [phone]. Critical Value/emergent results were called by telephone at the time of interpretation on 02/05/2018 at 9:04 pm to Dr. Carmon Sails , who verbally acknowledged these results. Electronically Signed   By: Suzy Bouchard M.D.   On: 02/05/2018 21:06   Dg Chest Port 1 View  Result Date: 02/09/2018 CLINICAL DATA:  Hypoxia EXAM: PORTABLE CHEST 1 VIEW COMPARISON:  None. FINDINGS: There is cardiomegaly with pulmonary vascular congestion. No overt edema. No focal consolidation, pleural effusion or pneumothorax. IMPRESSION: Cardiomegaly and pulmonary vascular congestion without overt pulmonary edema. Electronically Signed   By: Ulyses Jarred M.D.   On: 02/09/2018 19:40   . Wt Readings from Last 3 Encounters:  02/07/18 (!) 322 lb 8.5 oz (146.3 kg)  06/25/17 (!) 325 lb (147.4 kg)  06/11/17 (!) 325 lb 2.9 oz (147.5 kg)    ASSESSMENT & PLAN:  67 y.o. female  1. B/L Pulmonary embolism with mil RV systolic dysfunction notedd. 2. B/l Lower extremity DVT Her VTE is likely triggered by her hereto untreated ascending colon cancer. Multiple other risk factors - morbidt obesity .Body mass index is 50.52 kg/m., relative immobility, CHF  etc. Plan -given cancer associated extensive VTE would recommend treatment with lovenox as the gold standard. -NOAC dosing will not be optimal or reliable even if considered in the future since the patient is >130kg and adequacy of standard dosing is uncertain. -patient will need to be on active anticoagulation for atleast 6-9 months and ongoing till she has NED status from her cancer. If there is active cancer she will need to be on ongoing anticoagulation. -she will need atleast 6-8 weeks of anticoagulation to stabilize her PE/DVT prior to surgery for her colon cancer.  -will need rpt CTA chest and [place] b/l LE prior to surgery. -will need monitoring for GI on active anticoagulation at this time given untreated colon cancer that could bleed at this time.  3.Ascending colon Invasive adenocarcinoma diagnosed on colonoscopy in 06/2017. Was to have surgery with Dr Leighton Ruff but unable to /u due to insurance issues and other personal considerations. PLan -rpt CEA pending -CT abd/pelvis WO contrast and CTA chest -- no overt metastatic disease. -if CEA elevated will need PET/CT pre-op -consult Dr Leighton Ruff to reconnect patient regarding surgical plan -will need pulmonary input and consultation for pre-operative clearance with regards to oxygenation/Sleep apnea/b/l PE and cardiology pre-operative clearance as well. -patient will need to establish medical oncology f/u for her colon cancer with me is insurance allows or in network medical oncologist based on her insurance. -discussed life threatening consideration with regards to compliance with medical f/u.  Dispo - still needing significant O2. Concurrent sleep apnea will need to be factored into oxygen therapy. Patient will ideally need compliance with CPAP.  4. . Patient Active Problem List   Diagnosis Date Noted  . Acute pulmonary embolism  with acute cor pulmonale ([place]) 02/05/2018  . Adenocarcinoma of colon ([place]) 02/05/2018  . Colonic  mass 06/15/2017  . Anemia 06/11/2017  . Chronic diastolic (congestive) heart failure ([place]) 10/11/2015  . Persistent atrial fibrillation ([place]) 10/11/2015  . OSA (obstructive sleep apnea)   . Acute respiratory failure with hypoxia ([place])   . Hyperglycemia 09/06/2015  . Anasarca 09/06/2015  . Obesity, morbid ([place]) 09/06/2015   mx per hospitalist and on discharge PCP.  All of the patients questions were answered with apparent satisfaction. The patient knows to call the clinic with any problems, questions or concerns.  I spent 60 minutes counseling the patient face to face. The total time spent in the appointment was 80 minutes and more than 50% was on counseling and direct patient cares.    Sullivan Lone MD MS AAHIVMS [facility] Hematology/Oncology Physician [facility]  (Office):       [phone] (Work cell):  [phone] (Fax):           [phone]  02/08/2018 3:00 PM  This document serves as a record of services personally performed by Sullivan Lone, MD. It was created on his behalf by Baldwin [place], a trained medical scribe. The creation of this record is based on the scribe's personal observations and the provider's statements to them.   .I have reviewed the above documentation for accuracy and completeness, and I agree with the above. Sullivan Lone MD MS

## 2018-02-08 NOTE — Evaluation (Signed)
Physical Therapy Evaluation Patient Details Name: Amber Medina MRN: [id number] DOB: 30-Jan-1951 Today's Date: 02/08/2018   History of Present Illness  67 yo female admitted with acute PE. Hx of A fib,morbid obesity, anemia, CHF, CKD  Clinical Impression  On eval, pt was Min guard assist for mobility. She walked ~15 feet x 2 (to and from bathroom). O2 sats 87% on RA, 90% on 1L [place] O2 during activity. Dyspnea 3/4. Pt fatigues easily. Pt declined to ambulate any farther due to fatigue. Will continue to follow and progress activity as tolerated. Recommend HHPT at this time.     Follow Up Recommendations Home health PT    Equipment Recommendations  None recommended by PT    Recommendations for Other Services       Precautions / Restrictions Precautions Precautions: Fall Precaution Comments: monitor O2 sats Restrictions Weight Bearing Restrictions: No      Mobility  Bed Mobility Overal bed mobility: Modified Independent                Transfers Overall transfer level: Needs assistance   Transfers: Sit to/from Stand Sit to Stand: Min guard         General transfer comment: close guard for safety.   Ambulation/Gait Ambulation/Gait assistance: Min guard Ambulation Distance (Feet): 15 Feet(x2) Assistive device: None(IV pole) Gait Pattern/deviations: Step-through pattern;Decreased stride length     General Gait Details: Pt walked to and from bathroom. O2 sats  87% on RA, 90 % on 1L with activity. Dyspnea 3/4. Pt fatigues easily.  Stairs            Wheelchair Mobility    Modified Rankin (Stroke Patients Only)       Balance Overall balance assessment: Needs assistance           Standing balance-Leahy Scale: Fair                               Pertinent Vitals/Pain Pain Assessment: Faces Faces Pain Scale: Hurts even more Pain Location: back pain Pain Descriptors / Indicators: Aching;Discomfort Pain Intervention(s): Limited activity within  patient's tolerance;Repositioned    Home Living Family/patient expects to be discharged to:: Private residence Living Arrangements: Alone   Type of Home: House Home Access: Stairs to enter Entrance Stairs-Rails: Right Entrance Stairs-Number of Steps: 4-5 Home Layout: One level Home Equipment: Cane - single point;Walker - 4 wheels      Prior Function Level of Independence: Independent with assistive device(s)         Comments: using cane PRN     Hand Dominance        Extremity/Trunk Assessment   Upper Extremity Assessment Upper Extremity Assessment: Generalized weakness    Lower Extremity Assessment Lower Extremity Assessment: Generalized weakness    Cervical / Trunk Assessment Cervical / Trunk Assessment: Normal  Communication   Communication: No difficulties  Cognition Arousal/Alertness: Awake/alert Behavior During Therapy: WFL for tasks assessed/performed Overall Cognitive Status: Within Functional Limits for tasks assessed                                        General Comments      Exercises     Assessment/Plan    PT Assessment Patient needs continued PT services  PT Problem List Decreased mobility;Decreased activity tolerance;Obesity;Cardiopulmonary status limiting activity       PT Treatment Interventions  Gait training;Functional mobility training;Therapeutic activities;Patient/family education;Therapeutic exercise;DME instruction    PT Goals (Current goals can be found in the Care Plan section)  Acute Rehab PT Goals Patient Stated Goal: to get better PT Goal Formulation: With patient Time For Goal Achievement: 02/22/18 Potential to Achieve Goals: Good    Frequency Min 3X/week   Barriers to discharge        Co-evaluation               AM-PAC PT "6 Clicks" Daily Activity  Outcome Measure Difficulty turning over in bed (including adjusting bedclothes, sheets and blankets)?: A Lot Difficulty moving from lying on  back to sitting on the side of the bed? : A Lot Difficulty sitting down on and standing up from a chair with arms (e.g., wheelchair, bedside commode, etc,.)?: A Little Help needed moving to and from a bed to chair (including a wheelchair)?: A Little Help needed walking in hospital room?: A Little Help needed climbing 3-5 steps with a railing? : A Lot 6 Click Score: 15    End of Session Equipment Utilized During Treatment: Oxygen Activity Tolerance: Patient limited by fatigue Patient left: in bed;with call bell/phone within reach(with MD visiting pt )   PT Visit Diagnosis: Difficulty in walking, not elsewhere classified (R26.2);Muscle weakness (generalized) (M62.81);Other abnormalities of gait and mobility (R26.89)    Time: 1520-1540 PT Time Calculation (min) (ACUTE ONLY): 20 min   Charges:   PT Evaluation $PT Eval Moderate Complexity: 1 Mod     PT G Codes:          Weston Anna, MPT Pager: [phone]

## 2018-02-08 NOTE — Progress Notes (Signed)
PROGRESS NOTE [facility]   [name]   OEU:[id number] DOB: 02-15-1951  DOA: 02/05/2018 PCP: [name], MD   Brief Narrative:  Amber Medina is a 67 year old female with medical history significant for A. fib, chronic diastolic CHF, morbid obesity, colon cancer diagnosed back in September 2018 has not had any treatment yet. Patient presented to the ED with shortness of breath for 2 weeks prior to admission.  Upon ED  evaluation patient was found to be hypoxic, d-dimer positive, BNP 700, CTA with bilateral pulmonary emboli and evidence of right heart strain.  Elevated creatinine to 1.5.  Patient was admitted with working diagnosis of PE in setting of malignancy.  Subjective: Patient seen and examined, she continues to do well.  Slowly weaning off oxygen.  Denies chest pain, shortness of breath and palpitation.  No acute events overnight  Assessment & Plan: Acute hypoxic respiratory failure due to acute bilateral pulmonary embolism. In setting of malignancy from colon cancer. Initially treated with heparin drip, case was discussed with oncology who recommended to start Lovenox for now.  Oncology will see patient in consultation to arrange outpatient follow-up. Wean oxygen as able, obtain pulse ox with ambulation. Echocardiogram with mild signs of right heart strain, mild systolic dysfunction noted on the right side. Lower extremity Doppler left DVT   AKI Prerenal Creatinine continues to improve, encourage oral hydration Avoid nephrotoxic agent and hypotension, check renal function in a.m.  PAF Currently NSR Continue amiodarone, Cardizem and metoprolol On anticoagulation with heparin drips, will transition to Lovenox. Prior to admission was not on continuation due to GI bleed in the past with Xarelto.  Adenocarcinoma of the colon Newly diagnosed with in September 2018 No treatment yet, apparently insurance issues and was not able to have surgery done. Will arrange outpatient  follow-up with surgery, as with new acute PE patient will not be going to or anytime soon.  Hemoglobin remains stable, oncology will see patient she may need CT new images, will defer to oncology.  Chronic diastolic CHF Echocardiogram shows grade 1 diastolic dysfunction with mild signs of right heart strain Continue to hold Lasix for now no signs of fluid overload.  DVT prophylaxis: Lovenox full dose Code Status: Full code Family Communication: None at bedside Disposition Plan: Home in a.m. if able to wean off oxygen and ambulating with no issues  Consultants:   None  Procedures:   Echocardiogram  Antimicrobials:  None   Objective: Vitals:   02/07/18 1327 02/07/18 2111 02/08/18 0530 02/08/18 1318  BP: 129/73 (!) 143/90 (!) 113/52 (!) 113/55  Pulse: 65 65 60 61  Resp: 18 18 18 16   Temp: 98.2 F (36.8 C) 97.8 F (36.6 C) 98 F (36.7 C) 98.3 F (36.8 C)  TempSrc: Oral Oral Oral Oral  SpO2: 96% 95% 95% 93%  Weight:      Height:        Intake/Output Summary (Last 24 hours) at 02/08/2018 1331 Last data filed at 02/08/2018 0600 Gross per 24 hour  Intake 850.5 ml  Output -  Net 850.5 ml   Filed Weights   02/05/18 1752 02/07/18 1326  Weight: 136.1 kg (300 lb) (!) 146.3 kg (322 lb 8.5 oz)    Examination:  General: NAD Cardiovascular: RRR, S1/S2 + Respiratory: Good air entry, no wheezing or rhonchi Abdominal: Obese, soft, NT, ND, bowel sounds + Extremities: no edema  Data Reviewed: I have personally reviewed following labs and imaging studies  CBC: Recent Labs  Lab 02/05/18 1825 02/06/18 0324  02/07/18 0337 02/08/18 0530  WBC 11.4* 11.1* 12.0* 11.7*  NEUTROABS 9.3*  --   --   --   HGB 14.3 14.2 13.7 12.7  HCT 43.9 44.6 43.6 40.2  MCV 91.8 92.7 93.4 93.3  PLT 213 193 200 235   Basic Metabolic Panel: Recent Labs  Lab 02/05/18 1825 02/06/18 0324 02/07/18 0337  NA 137 137 138  K 4.2 4.6 4.4  CL 102 103 104  CO2 21* 22 24  GLUCOSE 180* 118* 107*  BUN  25* 28* 26*  CREATININE 1.59* 1.61* 1.39*  CALCIUM 9.0 9.4 9.1  MG  --   --  2.1   GFR: Estimated Creatinine Clearance: 57.6 mL/min (A) (by C-G formula based on SCr of 1.39 mg/dL (H)). Liver Function Tests: No results for input(s): AST, ALT, ALKPHOS, BILITOT, PROT, ALBUMIN in the last 168 hours. No results for input(s): LIPASE, AMYLASE in the last 168 hours. No results for input(s): AMMONIA in the last 168 hours. Coagulation Profile: No results for input(s): INR, PROTIME in the last 168 hours. Cardiac Enzymes: Recent Labs  Lab 02/05/18 1825  TROPONINI 0.48*   BNP (last 3 results) Recent Labs    06/11/17 1200 06/25/17 1147  PROBNP 574* 200   HbA1C: No results for input(s): HGBA1C in the last 72 hours. CBG: Recent Labs  Lab 02/05/18 2342 02/06/18 0322 02/06/18 2010  GLUCAP 127* 116* 209*   Lipid Profile: No results for input(s): CHOL, HDL, LDLCALC, TRIG, CHOLHDL, LDLDIRECT in the last 72 hours. Thyroid Function Tests: No results for input(s): TSH, T4TOTAL, FREET4, T3FREE, THYROIDAB in the last 72 hours. Anemia Panel: No results for input(s): VITAMINB12, FOLATE, FERRITIN, TIBC, IRON, RETICCTPCT in the last 72 hours. Sepsis Labs: No results for input(s): PROCALCITON, LATICACIDVEN in the last 168 hours.  Recent Results (from the past 240 hour(s))  MRSA PCR Screening     Status: None   Collection Time: 02/06/18  1:00 AM  Result Value Ref Range Status   MRSA by PCR NEGATIVE NEGATIVE Final    Comment:        The GeneXpert MRSA Assay (FDA approved for NASAL specimens only), is one component of a comprehensive MRSA colonization surveillance program. It is not intended to diagnose MRSA infection nor to guide or monitor treatment for MRSA infections. Performed at [facility], [place] [address], [place], [place] [address]       Radiology Studies: No results found.    Scheduled Meds: . amiodarone  200 mg Oral Daily  . diltiazem  240 mg Oral  Daily  . enoxaparin (LOVENOX) injection  150 mg Subcutaneous Q12H  . ferrous gluconate  324 mg Oral TID WC  . metoprolol succinate  25 mg Oral Daily  . pantoprazole  40 mg Oral Daily   Continuous Infusions: . heparin 1,950 Units/hr (02/08/18 0219)     LOS: 3 days   Time spent: Total of 35 minutes spent with pt, greater than 50% of which was spent in discussion of  treatment, counseling and coordination of care  Chipper Oman, MD Pager: Text Page via [email or web address]   If 7PM-7AM, please contact night-coverage [email or web address] 02/08/2018, 1:31 PM   Note - This record has been created using [employer]. Chart creation errors have been sought, but may not always have been located. Such creation errors do not reflect on the standard of medical care.

## 2018-02-08 NOTE — Plan of Care (Signed)

## 2018-02-09 ENCOUNTER — Inpatient Hospital Stay (HOSPITAL_COMMUNITY): Payer: Medicare Other

## 2018-02-09 LAB — URINALYSIS, ROUTINE W REFLEX MICROSCOPIC
BILIRUBIN URINE: NEGATIVE
Glucose, UA: NEGATIVE mg/dL
Ketones, ur: NEGATIVE mg/dL
Nitrite: NEGATIVE
Protein, ur: NEGATIVE mg/dL
SPECIFIC GRAVITY, URINE: 1.009 (ref 1.005–1.030)
pH: 5 (ref 5.0–8.0)

## 2018-02-09 LAB — BASIC METABOLIC PANEL
Anion gap: 10 (ref 5–15)
BUN: 24 mg/dL — ABNORMAL HIGH (ref 6–20)
CHLORIDE: 105 mmol/L (ref 101–111)
CO2: 23 mmol/L (ref 22–32)
CREATININE: 1.31 mg/dL — AB (ref 0.44–1.00)
Calcium: 9.3 mg/dL (ref 8.9–10.3)
GFR calc Af Amer: 47 mL/min — ABNORMAL LOW (ref >60)
GFR calc non Af Amer: 41 mL/min — ABNORMAL LOW (ref >60)
Glucose, Bld: 126 mg/dL — ABNORMAL HIGH (ref 65–99)
Potassium: 4.4 mmol/L (ref 3.5–5.1)
Sodium: 138 mmol/L (ref 135–145)

## 2018-02-09 LAB — CBC
HEMATOCRIT: 39.2 % (ref 36.0–46.0)
HEMOGLOBIN: 12.3 g/dL (ref 12.0–15.0)
MCH: 29.4 pg (ref 26.0–34.0)
MCHC: 31.4 g/dL (ref 30.0–36.0)
MCV: 93.8 fL (ref 78.0–100.0)
Platelets: 205 10*3/uL (ref 150–400)
RBC: 4.18 MIL/uL (ref 3.87–5.11)
RDW: 14.5 % (ref 11.5–15.5)
WBC: 11.3 10*3/uL — ABNORMAL HIGH (ref 4.0–10.5)

## 2018-02-09 LAB — MAGNESIUM: Magnesium: 2.1 mg/dL (ref 1.7–2.4)

## 2018-02-09 MED ORDER — IOPAMIDOL (ISOVUE-300) INJECTION 61%: 15.0000 mL | Freq: Two times a day (BID) | INTRAVENOUS | Status: DC | PRN

## 2018-02-09 MED ORDER — IOPAMIDOL (ISOVUE-300) INJECTION 61%
INTRAVENOUS | Status: AC
  Filled 2018-02-09: qty 30

## 2018-02-09 NOTE — Progress Notes (Signed)
PROGRESS NOTE [facility]   [name]   DXI:[id number] DOB: 1951-07-15  DOA: 02/05/2018 PCP: [name], MD   Brief Narrative:  Amber Medina is a 67 year old female with medical history significant for A. fib, chronic diastolic CHF, morbid obesity, colon cancer diagnosed back in September 2018 has not had any treatment yet. Patient presented to the ED with shortness of breath for 2 weeks prior to admission.  Upon ED  evaluation patient was found to be hypoxic, d-dimer positive, BNP 700, CTA with bilateral pulmonary emboli and evidence of right heart strain.  Elevated creatinine to 1.5.  Patient was admitted with working diagnosis of PE in setting of malignancy.  Subjective: Patient seen and examined, c/o weakness and difficulty with ambulating w/o getting SOB. No acute events overnight. Tolerating Lovenox injections well.   Assessment & Plan: Acute hypoxic respiratory failure due to acute bilateral pulmonary embolism. In setting of malignancy from colon cancer. Hypoxia slowly improving.  Initially treated with heparin drip, case was discussed with oncology who recommended Lovenox for now. Continue Wean O2 as able.  Echocardiogram with mild signs of right heart strain, mild systolic dysfunction noted on the right side. Lower extremity Doppler left DVT  PT evaluation recommending SNF patient in agreement  AKI Prerenal Creatinine continues to improve, encourage oral hydration Avoid nephrotoxic agent and hypotension, check renal function in a.m.  PAF Remains in sinus rhythm Continue amiodarone, Cardizem and metoprolol A/C with therapeutic Lovenox Prior to admission was not on continuation due to GI bleed in the past with Xarelto.  Adenocarcinoma of the colon Newly diagnosed with in September 2018 No treatment yet, apparently insurance issues and was not able to have surgery done. Need arrange follow up from SNF, for surgical evaluation with A. Marcello Moores, with new acute PE patient  this can be perform as outpatient.  Hemoglobin remains stable. CT abd/pelvis ordered today. Dicussed case with Dr. Irene Limbo, advised follow up as outpatient.   Chronic diastolic CHF Echocardiogram shows grade 1 diastolic dysfunction with mild signs of right heart strain No signs of fluid overload   DVT prophylaxis: Lovenox full dose Code Status: Full code Family Communication: None at bedside Disposition Plan: SNF in AM   Consultants:   None  Procedures:   Echocardiogram  Antimicrobials:  None   Objective: Vitals:   02/08/18 2232 02/09/18 0515 02/09/18 0948 02/09/18 1305  BP: (!) 119/56 (!) 108/44  132/71  Pulse: 65 64  71  Resp: (!) 22 18  20   Temp: 98.5 F (36.9 C) 98.6 F (37 C)  (!) 100.9 F (38.3 C)  TempSrc: Oral Oral  Oral  SpO2: 95% 92% (!) 88% 93%  Weight:      Height:        Intake/Output Summary (Last 24 hours) at 02/09/2018 1438 Last data filed at 02/09/2018 0512 Gross per 24 hour  Intake 918.9 ml  Output 850 ml  Net 68.9 ml   Filed Weights   02/05/18 1752 02/07/18 1326  Weight: 136.1 kg (300 lb) (!) 146.3 kg (322 lb 8.5 oz)    Examination:  General: NAD Cardiovascular: S1S2  Respiratory: Good air entry, no wheezing  Abdominal: Soft, NT, ND Extremities: no edema  Data Reviewed: I have personally reviewed following labs and imaging studies  CBC: Recent Labs  Lab 02/05/18 1825 02/06/18 0324 02/07/18 0337 02/08/18 0530 02/09/18 0527  WBC 11.4* 11.1* 12.0* 11.7* 11.3*  NEUTROABS 9.3*  --   --   --   --   HGB 14.3  14.2 13.7 12.7 12.3  HCT 43.9 44.6 43.6 40.2 39.2  MCV 91.8 92.7 93.4 93.3 93.8  PLT 213 193 200 207 607   Basic Metabolic Panel: Recent Labs  Lab 02/05/18 1825 02/06/18 0324 02/07/18 0337 02/09/18 0527  NA 137 137 138 138  K 4.2 4.6 4.4 4.4  CL 102 103 104 105  CO2 21* 22 24 23   GLUCOSE 180* 118* 107* 126*  BUN 25* 28* 26* 24*  CREATININE 1.59* 1.61* 1.39* 1.31*  CALCIUM 9.0 9.4 9.1 9.3  MG  --   --  2.1 2.1    GFR: Estimated Creatinine Clearance: 61.1 mL/min (A) (by C-G formula based on SCr of 1.31 mg/dL (H)). Liver Function Tests: No results for input(s): AST, ALT, ALKPHOS, BILITOT, PROT, ALBUMIN in the last 168 hours. No results for input(s): LIPASE, AMYLASE in the last 168 hours. No results for input(s): AMMONIA in the last 168 hours. Coagulation Profile: No results for input(s): INR, PROTIME in the last 168 hours. Cardiac Enzymes: Recent Labs  Lab 02/05/18 1825  TROPONINI 0.48*   BNP (last 3 results) Recent Labs    06/11/17 1200 06/25/17 1147  PROBNP 574* 200   HbA1C: No results for input(s): HGBA1C in the last 72 hours. CBG: Recent Labs  Lab 02/05/18 2342 02/06/18 0322 02/06/18 2010  GLUCAP 127* 116* 209*   Lipid Profile: No results for input(s): CHOL, HDL, LDLCALC, TRIG, CHOLHDL, LDLDIRECT in the last 72 hours. Thyroid Function Tests: No results for input(s): TSH, T4TOTAL, FREET4, T3FREE, THYROIDAB in the last 72 hours. Anemia Panel: No results for input(s): VITAMINB12, FOLATE, FERRITIN, TIBC, IRON, RETICCTPCT in the last 72 hours. Sepsis Labs: No results for input(s): PROCALCITON, LATICACIDVEN in the last 168 hours.  Recent Results (from the past 240 hour(s))  MRSA PCR Screening     Status: None   Collection Time: 02/06/18  1:00 AM  Result Value Ref Range Status   MRSA by PCR NEGATIVE NEGATIVE Final    Comment:        The GeneXpert MRSA Assay (FDA approved for NASAL specimens only), is one component of a comprehensive MRSA colonization surveillance program. It is not intended to diagnose MRSA infection nor to guide or monitor treatment for MRSA infections. Performed at [facility], [place] [address] [place], [place] [address]       Radiology Studies: No results found.    Scheduled Meds: . amiodarone  200 mg Oral Daily  . diltiazem  240 mg Oral Daily  . enoxaparin (LOVENOX) injection  150 mg Subcutaneous Q12H  . ferrous  gluconate  324 mg Oral TID WC  . iopamidol      . metoprolol succinate  25 mg Oral Daily  . pantoprazole  40 mg Oral Daily   Continuous Infusions:    LOS: 4 days   Time spent: Total of 25 minutes spent with pt, greater than 50% of which was spent in discussion of  treatment, counseling and coordination of care  Chipper Oman, MD Pager: Text Page via [email or web address]   If 7PM-7AM, please contact night-coverage [email or web address] 02/09/2018, 2:38 PM   Note - This record has been created using [employer]. Chart creation errors have been sought, but may not always have been located. Such creation errors do not reflect on the standard of medical care.

## 2018-02-09 NOTE — Care Management Important Message (Signed)
Important Message  Patient Details  Name: Reiko Vinje MRN: [id number] Date of Birth: 1951-07-11   Medicare Important Message Given:  Yes    Kerin Salen 02/09/2018, 12:54 [place] Message  Patient Details  Name: Klaryssa Fauth MRN: [id number] Date of Birth: 1950-10-21   Medicare Important Message Given:  Yes    Kerin Salen 02/09/2018, 12:54 PM

## 2018-02-09 NOTE — Progress Notes (Signed)
Physical Therapy Treatment Patient Details Name: Amber Medina MRN: [id number] DOB: July 10, 1951 Today's Date: 02/09/2018    History of Present Illness 67 yo female admitted with acute PE. Hx of A fib,morbid obesity, anemia, CHF, CKD    PT Comments    Pt progressing slowly with very limited activity tolerance due to dyspnea 2nd B PE's.  Pt required 3 lts this session vs 1 to achieve sats > 89% with gait.  Pt lives home alone and would like to D/C to SNF for ST Rehab.  Daughter is a Marine scientist but lives in [place] and son lives in [place].  Consulted LPT change in D/C plans.   Follow Up Recommendations  SNF     Equipment Recommendations  None recommended by PT    Recommendations for Other Services       Precautions / Restrictions Precautions Precautions: Fall Precaution Comments: monitor O2 sats Restrictions Weight Bearing Restrictions: No    Mobility  Bed Mobility               General bed mobility comments: NT   pt sitting EOB on arrival leaning on bed side table  Transfers Overall transfer level: Needs assistance Equipment used: Rolling walker (2 wheeled) Transfers: Sit to/from Stand Sit to Stand: Min assist         General transfer comment: assist for safety and equipment/recliner/oxygen   Ambulation/Gait Ambulation/Gait assistance: Min guard Ambulation Distance (Feet): 18 Feet(12 feet then another 6 feet after a sitting rest break) Assistive device: Rolling walker (2 wheeled) Gait Pattern/deviations: Step-to pattern;Decreased stance time - left Gait velocity: decreased   General Gait Details: very limited amb distance/tolerance.  Pt required 3 lts vs 1 lt to achieve sats >90%.  Dyspnea 3/4     Stairs             Wheelchair Mobility    Modified Rankin (Stroke Patients Only)       Balance                                            Cognition Arousal/Alertness: Awake/alert Behavior During Therapy: WFL for tasks  assessed/performed Overall Cognitive Status: Within Functional Limits for tasks assessed                                        Exercises      General Comments        Pertinent Vitals/Pain Pain Assessment: Faces Faces Pain Scale: Hurts a little bit Pain Location: back pain Pain Descriptors / Indicators: Constant Pain Intervention(s): Monitored during session    Home Living                      Prior Function            PT Goals (current goals can now be found in the care plan section)      Frequency    Min 3X/week      PT Plan Current plan remains appropriate    Co-evaluation              AM-PAC PT "6 Clicks" Daily Activity  Outcome Measure  Difficulty turning over in bed (including adjusting bedclothes, sheets and blankets)?: A Lot Difficulty moving from lying on back to sitting on the side of  the bed? : A Lot Difficulty sitting down on and standing up from a chair with arms (e.g., wheelchair, bedside commode, etc,.)?: A Lot Help needed moving to and from a bed to chair (including a wheelchair)?: A Lot Help needed walking in hospital room?: A Lot Help needed climbing 3-5 steps with a railing? : A Lot 6 Click Score: 12    End of Session Equipment Utilized During Treatment: Oxygen Activity Tolerance: Treatment limited secondary to medical complications (Comment) Patient left: in chair;with call bell/phone within reach Nurse Communication: Mobility status PT Visit Diagnosis: Difficulty in walking, not elsewhere classified (R26.2);Muscle weakness (generalized) (M62.81);Other abnormalities of gait and mobility (R26.89)     Time: 1025-1050 PT Time Calculation (min) (ACUTE ONLY): 25 min  Charges:  $Gait Training: 8-22 mins $Therapeutic Activity: 8-22 mins                    G Codes:       Rica Koyanagi  PTA [facility]  Acute  Rehab Pager      [phone]

## 2018-02-10 ENCOUNTER — Inpatient Hospital Stay (HOSPITAL_COMMUNITY): Payer: Medicare Other

## 2018-02-10 DIAGNOSIS — R0902 Hypoxemia: Secondary | ICD-10-CM

## 2018-02-10 LAB — CBC
HCT: 39.6 % (ref 36.0–46.0)
HEMOGLOBIN: 12.4 g/dL (ref 12.0–15.0)
MCH: 29.2 pg (ref 26.0–34.0)
MCHC: 31.3 g/dL (ref 30.0–36.0)
MCV: 93.2 fL (ref 78.0–100.0)
Platelets: 222 10*3/uL (ref 150–400)
RBC: 4.25 MIL/uL (ref 3.87–5.11)
RDW: 14.5 % (ref 11.5–15.5)
WBC: 11.2 10*3/uL — ABNORMAL HIGH (ref 4.0–10.5)

## 2018-02-10 LAB — BASIC METABOLIC PANEL
Anion gap: 9 (ref 5–15)
BUN: 22 mg/dL — AB (ref 6–20)
CHLORIDE: 107 mmol/L (ref 101–111)
CO2: 23 mmol/L (ref 22–32)
Calcium: 9.3 mg/dL (ref 8.9–10.3)
Creatinine, Ser: 1.2 mg/dL — ABNORMAL HIGH (ref 0.44–1.00)
GFR calc Af Amer: 52 mL/min — ABNORMAL LOW (ref >60)
GFR calc non Af Amer: 45 mL/min — ABNORMAL LOW (ref >60)
GLUCOSE: 120 mg/dL — AB (ref 65–99)
POTASSIUM: 4.5 mmol/L (ref 3.5–5.1)
Sodium: 139 mmol/L (ref 135–145)

## 2018-02-10 LAB — MAGNESIUM: Magnesium: 2.1 mg/dL (ref 1.7–2.4)

## 2018-02-10 MED ORDER — FUROSEMIDE 10 MG/ML IJ SOLN
40.0000 mg | Freq: Every day | INTRAMUSCULAR | Status: DC
  Administered 2018-02-10: 40 mg via INTRAVENOUS
  Filled 2018-02-10: qty 4

## 2018-02-10 NOTE — Clinical Social Work Note (Signed)
Clinical Social Work Assessment  Patient Details  Name: Amber Medina MRN: [id number] Date of Birth: 24-Apr-1951  Date of referral:  02/10/18               Reason for consult:  Facility Placement                Permission sought to share information with:    Permission granted to share information::     Name::        Agency::     Relationship::     Contact Information:     Housing/Transportation Living arrangements for the past 2 months:  Single Family Home Source of Information:  Patient, Medical Team Patient Interpreter Needed:  None Criminal Activity/Legal Involvement Pertinent to Current Situation/Hospitalization:  No - Comment as needed Significant Relationships:  Neighbor, Siblings, Adult Children Lives with:  Self Do you feel safe going back to the place where you live?  Yes Need for family participation in patient care:  No (Coment)  Care giving concerns:  Pt admitted from home where she resides alone. Neighbors are nearest supports, adult children live out of state, siblings live nearby as well. Pt reports prior to admission she was walking around house with walker and was independent other than not driving. Her main concern is that she currently is requiring O2 which she states is new.    Social Worker assessment / plan:  CSW consulted to assist with potential SNF placement. Met with pt at bedside- she was alert, oriented, welcoming of CSW involvement but highly upset and anxious re: DC planning. SHe explains she had planned to return home at DC "but today when I walked down the hall I felt like I was going to die and I'm very scared." Processed this with pt and discussed options. Pt states she is highly reluctant to agree to pursuing SNF placement as "her father died in one of those, I see how they treat you there." Discussed what pt would need in order to be successful at home and pt states "she would need someone there with me" and discussed possibility of hiring caregivers and  having neighbors stay with her for first few days of being home, however she could not agree to pursue this plan either. Processed with pt her reluctance to DC plan and pt identified that what her hope is is "to feel well enough to go back home before I'm discharged." CSW discussed with pt that often convalescence is done at venue post-hospital-DC rather than inpatient. Pt reports understanding but continued to deflect and was unable to identify what her DC planning goals were other than "to see if I feel okay enough to go home tomorrow." Unable to describe what "okay" would be other than "being able to walk down the hall without feeling like I'm going to die." Also discussed with pt that her insurance requires pre-authorization for SNF admission and pt agreed to referrals in order to investigate options.  Plan: TBD- pt very undecided re: her DC wishes and goals.   Employment status:  Retired Primary school teacher) PT Recommendations:  [place] / Referral to community resources:  [place]  Patient/Family's Response to care:  Pt states she appreciates care but feels she "is being kicked out the door"  Patient/Family's Understanding of and Emotional Response to Diagnosis, Current Treatment, and Prognosis:  See above, pt understanding of treatment is clear but goals not identified. See description of emotional response above as  well, anxious.  Emotional Assessment Appearance:  Appears stated age Attitude/Demeanor/Rapport:  Apprehensive Affect (typically observed):  Adaptable, Anxious Orientation:  Oriented to Self, Oriented to Place, Oriented to  Time, Oriented to Situation Alcohol / Substance use:  Not Applicable Psych involvement (Current and /or in the community):  No (Comment)  Discharge Needs  Concerns to be addressed:  Decision making concerns, Discharge Planning Concerns Readmission within the last 30 days:   No Current discharge risk:  None Barriers to Discharge:  No Barriers Identified   Nila Nephew, LCSW 02/10/2018, 8:42 AM  [phone]

## 2018-02-10 NOTE — NC FL2 (Signed)
[place] LEVEL OF CARE SCREENING TOOL     IDENTIFICATION  Patient Name: Amber Medina Birthdate: 1950/11/06 Sex: female Admission Date (Current Location): 02/05/2018  [facility] and [place] Number:  Herbalist and Address:  [facility] [place],  [place] [address], [place]      Provider Number: [id number]  Attending Physician Name and Address:  Aline August, MD  Relative Name and Phone Number:       Current Level of Care: Hospital Recommended Level of Care: [place] Prior Approval Number:    Date Approved/Denied:   PASRR Number: [id number]  Discharge Plan: SNF    Current Diagnoses: Patient Active Problem List   Diagnosis Date Noted  . Acute pulmonary embolism with acute cor pulmonale ([place]) 02/05/2018  . Adenocarcinoma of colon ([place]) 02/05/2018  . Colonic mass 06/15/2017  . Anemia 06/11/2017  . Chronic diastolic (congestive) heart failure ([place]) 10/11/2015  . Persistent atrial fibrillation ([place]) 10/11/2015  . OSA (obstructive sleep apnea)   . Acute respiratory failure with hypoxia ([place])   . Hyperglycemia 09/06/2015  . Anasarca 09/06/2015  . Obesity, morbid ([place]) 09/06/2015    Orientation RESPIRATION BLADDER Height & Weight     Self, Time, Place, Situation  O2(3L) Continent Weight: (!) 322 lb 8.5 oz (146.3 kg) Height:  5\' 7"  (170.2 cm)  BEHAVIORAL SYMPTOMS/MOOD NEUROLOGICAL BOWEL NUTRITION STATUS      Continent Diet(heart healthy diet)  AMBULATORY STATUS COMMUNICATION OF NEEDS Skin   Limited Assist Verbally Normal                       Personal Care Assistance Level of Assistance  Bathing, Dressing, Feeding Bathing Assistance: Limited assistance Feeding assistance: Independent Dressing Assistance: Limited assistance     Functional Limitations Info  Sight, Hearing, Speech Sight Info: Adequate Hearing Info: Adequate Speech Info: Adequate    SPECIAL CARE FACTORS FREQUENCY  PT (By licensed PT), OT (By  licensed OT)     PT Frequency: 5x OT Frequency: 5x            Contractures Contractures Info: Not present    Additional Factors Info  Code Status, Allergies Code Status Info: full code Allergies Info: Compazine Prochlorperazine Edisylate           Current Medications (02/10/2018):  This is the current hospital active medication list Current Facility-Administered Medications  Medication Dose Route Frequency Provider Last Rate Last Dose  . acetaminophen (TYLENOL) tablet 1,000 mg  1,000 mg Oral Q8H PRN Patrecia Pour, Christean Grief, MD      . amiodarone (PACERONE) tablet 200 mg  200 mg Oral Daily Patrecia Pour, Christean Grief, MD   200 mg at 02/10/18 [id number]  . diltiazem (CARDIZEM CD) 24 hr capsule 240 mg  240 mg Oral Daily Patrecia Pour, Christean Grief, MD   240 mg at 02/10/18 0853  . enoxaparin (LOVENOX) injection 150 mg  150 mg Subcutaneous Q12H Patrecia Pour, Christean Grief, MD   150 mg at 02/10/18 0555  . ferrous gluconate (FERGON) tablet 324 mg  324 mg Oral TID WC Patrecia Pour, Christean Grief, MD   324 mg at 02/10/18 1213  . furosemide (LASIX) injection 40 mg  40 mg Intravenous Daily Alekh, Kshitiz, MD      . iopamidol (ISOVUE-300) 61 % injection 15 mL  15 mL Oral BID PRN Patrecia Pour, Christean Grief, MD      . metoprolol succinate (TOPROL-XL) 24 hr tablet 25 mg  25 mg Oral Daily Patrecia Pour, Christean Grief, MD  25 mg at 02/10/18 0852  . ondansetron (ZOFRAN) tablet 4 mg  4 mg Oral Q6H PRN Patrecia Pour, Christean Grief, MD       Or  . ondansetron [facility]) injection 4 mg  4 mg Intravenous Q6H PRN Patrecia Pour, Christean Grief, MD      . pantoprazole (PROTONIX) EC tablet 40 mg  40 mg Oral Daily Patrecia Pour, Christean Grief, MD   40 mg at 02/10/18 0853  . tetrahydrozoline 0.05 % ophthalmic solution 1-2 drop  1-2 drop Both Eyes Daily PRN Patrecia Pour, Christean Grief, MD         Discharge Medications: Please see discharge summary for a list of discharge medications.  Relevant Imaging Results:  Relevant Lab Results:   Additional Information SS# [id number]  Nila Nephew,  LCSW

## 2018-02-10 NOTE — Progress Notes (Signed)
Patient ID: Amber Medina, female   DOB: [birth year], 67 y.o.   MRN: [id number]  PROGRESS NOTE    Amber Medina  GQB:[id number] DOB: 1951/04/28 DOA: 02/05/2018 PCP: [name], MD   Brief Narrative:  67 year old female with history of A. fib, chronic diastolic CHF, morbid obesity, colon cancer diagnosed back in September 2018 but has not received any treatment yet presented on 02/05/2018 with shortness of breath for 2 weeks.  She was found to be hypoxic and CT angiogram showed bilateral pulmonary embolism and evidence of right heart strain.  Patient is currently on Lovenox after discussion with oncology.  Assessment & Plan:   Principal Problem:   Acute pulmonary embolism with acute cor pulmonale (HCC) Active Problems:   Acute respiratory failure with hypoxia (HCC)   Chronic diastolic (congestive) heart failure (HCC)   Persistent atrial fibrillation (HCC)   Adenocarcinoma of colon (HCC)  Hypoxia secondary to pulmonary embolism -Improving.  Currently on room air.  Incentive spirometry  Acute bilateral pulmonary embolism with left popliteal vein DVT -In the setting of malignancy -Patient was initially started on heparin drip which was switched to Lovenox after prior hospitalist discussed the case with oncology. -Continue Lovenox which will be continued even on discharge. -Echo showed mild signs of right heart strain, mild systolic dysfunction noted on the right side  Generalized deconditioning -Continue physical therapy.  PT recommends nursing home placement.  Social worker consulted  Adenocarcinoma of the colon -Newly diagnosed in September 2018 -Patient has not received any treatment yet, apparently due to insurance issues and was not able to have surgery done. -Patient needs outpatient arrangement of follow-up with general surgery/Dr. Marcello Moores -Outpatient follow-up with oncology/Dr.Kale  Paroxysmal atrial fibrillation -Currently rate controlled.  Continue amiodarone, Cardizem and metoprolol.   Continue anticoagulation with Lovenox -Prior to admission, patient was not on anticoagulation due to GI bleed in the past with Xarelto -Outpatient follow-up with cardiology  Chronic diastolic CHF -Echo showed EF of 55 to 60% with grade 1 diastolic dysfunction.  We will start Lasix 40 mg IV daily.  Monitor input and output.  Daily weights.  Acute kidney injury -Creatinine improving.  Repeat a.m. Labs.  Morbid obesity -Outpatient follow-up     DVT prophylaxis: Lovenox full dose Code Status: Full Family Communication: None at bedside Disposition Plan: Nursing home once bed is available  Consultants: None  Procedures:  Echo on 02/06/2018 Study Conclusions  - Procedure narrative: Transthoracic echocardiography. Image   quality was suboptimal. - Left ventricle: The cavity size was normal. Systolic function was   normal. The estimated ejection fraction was in the range of 55%   to 60%. Doppler parameters are consistent with abnormal left   ventricular relaxation (grade 1 diastolic dysfunction). - Aortic valve: Mildly calcified annulus. Trileaflet; mildly   calcified leaflets. There was mild regurgitation. - Aorta: Mild ascending aortic dilatation. - Mitral valve: Mildly calcified annulus. Normal thickness leaflets   . - Right ventricle: Mild systolic dysfunction noted. - Right atrium: The atrium was mildly dilated. - Tricuspid valve: There was mild regurgitation.   Antimicrobials: None   Subjective: Patient seen and examined at bedside.  She feels better this morning.  She was extremely short of breath yesterday with activity.  No overnight fever, nausea or vomiting or chest pain.  Objective: Vitals:   02/09/18 1305 02/09/18 2149 02/10/18 0608 02/10/18 0853  BP: 132/71 (!) 119/97 114/64 127/61  Pulse: 71 66 63 63  Resp: 20 20 20 20   Temp: (!) 100.9 F (38.3 C)  98.3 F (36.8 C) 98.2 F (36.8 C) 98 F (36.7 C)  TempSrc: Oral Oral  Oral  SpO2: 93% 92% 94% 94%    Weight:      Height:        Intake/Output Summary (Last 24 hours) at 02/10/2018 1256 Last data filed at 02/10/2018 0800 Gross per 24 hour  Intake 360 ml  Output 1000 ml  Net -640 ml   Filed Weights   02/05/18 1752 02/07/18 1326  Weight: 136.1 kg (300 lb) (!) 146.3 kg (322 lb 8.5 oz)    Examination:  General exam: Appears calm and comfortable.  Looks older than stated age Respiratory system: Bilateral decreased breath sound at bases with scattered crackles Cardiovascular system: S1 & S2 heard, rate controlled  gastrointestinal system: Abdomen is morbidly obese nondistended, soft and nontender. Normal bowel sounds heard. Extremities: No cyanosis, clubbing; trace lower extremity edema   Data Reviewed: I have personally reviewed following labs and imaging studies  CBC: Recent Labs  Lab 02/05/18 1825 02/06/18 0324 02/07/18 0337 02/08/18 0530 02/09/18 0527 02/10/18 0514  WBC 11.4* 11.1* 12.0* 11.7* 11.3* 11.2*  NEUTROABS 9.3*  --   --   --   --   --   HGB 14.3 14.2 13.7 12.7 12.3 12.4  HCT 43.9 44.6 43.6 40.2 39.2 39.6  MCV 91.8 92.7 93.4 93.3 93.8 93.2  PLT 213 193 200 207 205 539   Basic Metabolic Panel: Recent Labs  Lab 02/05/18 1825 02/06/18 0324 02/07/18 0337 02/09/18 0527 02/10/18 0514  NA 137 137 138 138 139  K 4.2 4.6 4.4 4.4 4.5  CL 102 103 104 105 107  CO2 21* 22 24 23 23   GLUCOSE 180* 118* 107* 126* 120*  BUN 25* 28* 26* 24* 22*  CREATININE 1.59* 1.61* 1.39* 1.31* 1.20*  CALCIUM 9.0 9.4 9.1 9.3 9.3  MG  --   --  2.1 2.1 2.1   GFR: Estimated Creatinine Clearance: 66.7 mL/min (A) (by C-G formula based on SCr of 1.2 mg/dL (H)). Liver Function Tests: No results for input(s): AST, ALT, ALKPHOS, BILITOT, PROT, ALBUMIN in the last 168 hours. No results for input(s): LIPASE, AMYLASE in the last 168 hours. No results for input(s): AMMONIA in the last 168 hours. Coagulation Profile: No results for input(s): INR, PROTIME in the last 168 hours. Cardiac  Enzymes: Recent Labs  Lab 02/05/18 1825  TROPONINI 0.48*   BNP (last 3 results) Recent Labs    06/11/17 1200 06/25/17 1147  PROBNP 574* 200   HbA1C: No results for input(s): HGBA1C in the last 72 hours. CBG: Recent Labs  Lab 02/05/18 2342 02/06/18 0322 02/06/18 2010  GLUCAP 127* 116* 209*   Lipid Profile: No results for input(s): CHOL, HDL, LDLCALC, TRIG, CHOLHDL, LDLDIRECT in the last 72 hours. Thyroid Function Tests: No results for input(s): TSH, T4TOTAL, FREET4, T3FREE, THYROIDAB in the last 72 hours. Anemia Panel: No results for input(s): VITAMINB12, FOLATE, FERRITIN, TIBC, IRON, RETICCTPCT in the last 72 hours. Sepsis Labs: No results for input(s): PROCALCITON, LATICACIDVEN in the last 168 hours.  Recent Results (from the past 240 hour(s))  MRSA PCR Screening     Status: None   Collection Time: 02/06/18  1:00 AM  Result Value Ref Range Status   MRSA by PCR NEGATIVE NEGATIVE Final    Comment:        The GeneXpert MRSA Assay (FDA approved for NASAL specimens only), is one component of a comprehensive MRSA colonization surveillance program. It is not intended  to diagnose MRSA infection nor to guide or monitor treatment for MRSA infections. Performed at [facility], [place] [address], [place], [place] [address]          Radiology Studies: Ct Abdomen Pelvis Wo Contrast  Result Date: 02/09/2018 CLINICAL DATA:  Colon cancer. EXAM: CT ABDOMEN AND PELVIS WITHOUT CONTRAST TECHNIQUE: Multidetector CT imaging of the abdomen and pelvis was performed following the standard protocol without IV contrast. COMPARISON:  06/11/2017. FINDINGS: Lower chest: Subsegmental atelectasis noted posterior right lower lobe. Hepatobiliary: The liver shows diffusely decreased attenuation suggesting steatosis. No focal abnormality identified within the liver parenchyma on this noncontrast exam. Tiny calcified gallstones evident. No intrahepatic or extrahepatic biliary  dilation. Pancreas: No focal mass lesion. No dilatation of the main duct. No intraparenchymal cyst. No peripancreatic edema. Spleen: No splenomegaly. No focal mass lesion. Adrenals/Urinary Tract: Similar appearance bilateral adrenal nodules measuring 17 mm on the right and 18 mm on the left. No hydronephrosis evident within either kidney. Stable 10 mm hypoattenuating lesion in the upper pole the right kidney. No evidence for hydroureter. The urinary bladder appears normal for the degree of distention. Stomach/Bowel: Stomach is nondistended. No gastric wall thickening. No evidence of outlet obstruction. Duodenum is normally positioned as is the ligament of Treitz. No small bowel wall thickening. No small bowel dilatation. The terminal ileum is normal. The appendix is normal. Diverticuli are seen scattered along the entire length of the colon without CT findings of diverticulitis. Vascular/Lymphatic: There is abdominal aortic atherosclerosis without aneurysm. There is no gastrohepatic or hepatoduodenal ligament lymphadenopathy. No intraperitoneal or retroperitoneal lymphadenopathy. No pelvic sidewall lymphadenopathy. Reproductive: Prominent low-attenuation again noted in the central cervix. Focal contour bulge right uterine fundus compatible with fibroid. Endometrium prominent in a postmenopausal female. There is no adnexal mass. Other: No intraperitoneal free fluid. Musculoskeletal: Bone windows reveal no worrisome lytic or sclerotic osseous lesions. IMPRESSION: 1. No evidence for metastatic disease in the abdomen or pelvis on this noncontrast exam. 2. Apparent thickening of the cervical canal with similar appearance of prominent endometrium. Pelvic ultrasound recommended to further evaluate. 3. Cholelithiasis 4. Hepatic steatosis. 5. Stable small bilateral adrenal nodules. Attention on follow-up recommended. 6.  Aortic Atherosclerois (ICD10-170.0) Electronically Signed   By: Misty Stanley M.D.   On: 02/09/2018 15:02    Dg Chest Port 1 View  Result Date: 02/09/2018 CLINICAL DATA:  Hypoxia EXAM: PORTABLE CHEST 1 VIEW COMPARISON:  None. FINDINGS: There is cardiomegaly with pulmonary vascular congestion. No overt edema. No focal consolidation, pleural effusion or pneumothorax. IMPRESSION: Cardiomegaly and pulmonary vascular congestion without overt pulmonary edema. Electronically Signed   By: Ulyses Jarred M.D.   On: 02/09/2018 19:40        Scheduled Meds: . amiodarone  200 mg Oral Daily  . diltiazem  240 mg Oral Daily  . enoxaparin (LOVENOX) injection  150 mg Subcutaneous Q12H  . ferrous gluconate  324 mg Oral TID WC  . metoprolol succinate  25 mg Oral Daily  . pantoprazole  40 mg Oral Daily   Continuous Infusions:   LOS: 5 days        Aline August, MD [facility] Pager [phone]  If 7PM-7AM, please contact night-coverage [email or web address] Password [facility] 02/10/2018, 12:56 PM

## 2018-02-10 NOTE — Progress Notes (Signed)
Patient continues to decline nocturnal CPAP.  

## 2018-02-10 NOTE — Progress Notes (Addendum)
Patient continue to sat 84% on 4L after ambulating from chair to [facility] to bed, lung sound clear/diminished, pt noted resting in bed at this time. MD aware. Order received for CXR. Continuous pulse ox placed. Will continue to monitor.    Ave Filter, RN

## 2018-02-10 NOTE — Progress Notes (Signed)
Met with pt to discuss DC plan again. Pt now reports she is more open to SNF. CSW completed referrals and initiated [employer] authorization- pt selected [facility] from bed offers. Will follow to assist.   Sharren Bridge, MSW, LCSW Clinical Social Work 02/10/2018 [phone]

## 2018-02-11 ENCOUNTER — Other Ambulatory Visit: Payer: Self-pay | Admitting: Cardiology

## 2018-02-11 DIAGNOSIS — I5033 Acute on chronic diastolic (congestive) heart failure: Secondary | ICD-10-CM

## 2018-02-11 LAB — BASIC METABOLIC PANEL
ANION GAP: 12 (ref 5–15)
BUN: 24 mg/dL — AB (ref 6–20)
CO2: 23 mmol/L (ref 22–32)
CREATININE: 1.41 mg/dL — AB (ref 0.44–1.00)
Calcium: 9.2 mg/dL (ref 8.9–10.3)
Chloride: 105 mmol/L (ref 101–111)
GFR, EST AFRICAN AMERICAN: 43 mL/min — AB (ref >60)
GFR, EST NON AFRICAN AMERICAN: 37 mL/min — AB (ref >60)
Glucose, Bld: 138 mg/dL — ABNORMAL HIGH (ref 65–99)
POTASSIUM: 3.8 mmol/L (ref 3.5–5.1)
SODIUM: 140 mmol/L (ref 135–145)

## 2018-02-11 LAB — CBC
HCT: 40.6 % (ref 36.0–46.0)
Hemoglobin: 12.9 g/dL (ref 12.0–15.0)
MCH: 29.5 pg (ref 26.0–34.0)
MCHC: 31.8 g/dL (ref 30.0–36.0)
MCV: 92.7 fL (ref 78.0–100.0)
PLATELETS: 221 10*3/uL (ref 150–400)
RBC: 4.38 MIL/uL (ref 3.87–5.11)
RDW: 14.6 % (ref 11.5–15.5)
WBC: 12.7 10*3/uL — AB (ref 4.0–10.5)

## 2018-02-11 LAB — BLOOD GAS, ARTERIAL
ACID-BASE EXCESS: 1.2 mmol/L (ref 0.0–2.0)
Bicarbonate: 24.9 mmol/L (ref 20.0–28.0)
Drawn by: 270211
O2 Content: 15 L/min
O2 Saturation: 90.4 %
PCO2 ART: 38.6 mmHg (ref 32.0–48.0)
PH ART: 7.426 (ref 7.350–7.450)
Patient temperature: 98.6
pO2, Arterial: 62.1 mmHg — ABNORMAL LOW (ref 83.0–108.0)

## 2018-02-11 LAB — MAGNESIUM: Magnesium: 1.9 mg/dL (ref 1.7–2.4)

## 2018-02-11 LAB — CEA: CEA1: 3.1 ng/mL (ref 0.0–4.7)

## 2018-02-11 LAB — TROPONIN I: TROPONIN I: 0.03 ng/mL — AB (ref <0.03)

## 2018-02-11 MED ORDER — SALINE SPRAY 0.65 % NA SOLN
1.0000 | NASAL | Status: DC | PRN
  Administered 2018-02-11: 1 via NASAL
  Filled 2018-02-11: qty 44

## 2018-02-11 MED ORDER — FUROSEMIDE 10 MG/ML IJ SOLN
80.0000 mg | Freq: Once | INTRAMUSCULAR | Status: AC
  Administered 2018-02-11: 80 mg via INTRAVENOUS
  Filled 2018-02-11: qty 8

## 2018-02-11 NOTE — Progress Notes (Signed)
Dr Starla Link notifed of ABGS results and of troponin of 0.03

## 2018-02-11 NOTE — Progress Notes (Signed)
Pt refusing bipap at this time. Placed bipap at pts bedside and explained if her oxygen saturations drop we will have no other option but to try the bipap. She understands. O2 saturations are 91% on 15 L [place] high flow nasal cannula at this time. RN aware.

## 2018-02-11 NOTE — Progress Notes (Signed)
Patient ID: Amber Medina, female   DOB: 22-Jun-1951, 67 y.o.   MRN: [id number]  PROGRESS NOTE    Amber Medina  WHQ:[id number] DOB: [birth year] DOA: 02/05/2018 PCP: Amber Panda, MD   Brief Narrative:  67 year old female with history of A. fib, chronic diastolic CHF, morbid obesity, colon cancer diagnosed back in September 2018 but has not received any treatment yet presented on 02/05/2018 with shortness of breath for 2 weeks.  She was found to be hypoxic and CT angiogram showed bilateral pulmonary embolism and evidence of right heart strain.  Patient is currently on Lovenox after discussion with oncology.  Assessment & Plan:   Principal Problem:   Acute pulmonary embolism with acute cor pulmonale (HCC) Active Problems:   Acute respiratory failure with hypoxia (HCC)   Chronic diastolic (congestive) heart failure (HCC)   Persistent atrial fibrillation (HCC)   Adenocarcinoma of colon (HCC)  Acute hypoxic respiratory failure secondary to pulmonary embolism -Respiratory status has worsened since yesterday.  Currently on 12 L oxygen. -Chest x-ray from yesterday showed no infiltrates -We will get stat ABG and troponin.  If respiratory status worsens, might need BiPAP and might have to consider pulmonary evaluation. -Diuretic plan as below  Acute on chronic diastolic CHF -Give Lasix 80 mg IV x1 dose.  -Echo showed EF of 55 to 60% with grade 1 diastolic dysfunction. Monitor input and output.  Daily weights.  Acute bilateral pulmonary embolism with left popliteal vein DVT -In the setting of malignancy -Patient was initially started on heparin drip which was switched to Lovenox after prior hospitalist discussed the case with oncology. -Continue Lovenox for now. -Echo showed mild signs of right heart strain, mild systolic dysfunction noted on the right side  Generalized deconditioning - PT recommends nursing home placement.  Social worker consulted  Adenocarcinoma of the colon -Newly diagnosed in  September 2018 -Patient has not received any treatment yet, apparently due to insurance issues and was not able to have surgery done. -Patient needs outpatient arrangement of follow-up with general surgery/Dr. Marcello Medina -Outpatient follow-up with oncology/Dr.Kale  Paroxysmal atrial fibrillation -Currently rate controlled.  Continue amiodarone, Cardizem and metoprolol.  Continue anticoagulation with Lovenox -Prior to admission, patient was not on anticoagulation due to GI bleed in the past with Xarelto -Outpatient follow-up with cardiology  Acute kidney injury -Creatinine improving.  Repeat a.m. Labs.  Caution with use of diuretics  Morbid obesity -Outpatient follow-up     DVT prophylaxis: Lovenox full dose Code Status: Full Family Communication: None at bedside Disposition Plan: Nursing home once bed is available  Consultants: None  Procedures:  Echo on 02/06/2018 Study Conclusions  - Procedure narrative: Transthoracic echocardiography. Image   quality was suboptimal. - Left ventricle: The cavity size was normal. Systolic function was   normal. The estimated ejection fraction was in the range of 55%   to 60%. Doppler parameters are consistent with abnormal left   ventricular relaxation (grade 1 diastolic dysfunction). - Aortic valve: Mildly calcified annulus. Trileaflet; mildly   calcified leaflets. There was mild regurgitation. - Aorta: Mild ascending aortic dilatation. - Mitral valve: Mildly calcified annulus. Normal thickness leaflets   . - Right ventricle: Mild systolic dysfunction noted. - Right atrium: The atrium was mildly dilated. - Tricuspid valve: There was mild regurgitation.   Antimicrobials: None   Subjective: Patient seen and examined at bedside.  She feels short of breath even with minimal activity.  No overnight fever, nausea, vomiting or chest pain.  Objective: Vitals:   02/11/18 0600 02/11/18 0615  02/11/18 0630 02/11/18 0645  BP:      Pulse: 60 60  63 (!) 58  Resp:      Temp:      TempSrc:      SpO2: 90% 93% (!) 89% (!) 89%  Weight:      Height:        Intake/Output Summary (Last 24 hours) at 02/11/2018 0757 Last data filed at 02/11/2018 0201 Gross per 24 hour  Intake 480 ml  Output 800 ml  Net -320 ml   Filed Weights   02/05/18 1752 02/07/18 1326 02/11/18 0511  Weight: 136.1 kg (300 lb) (!) 146.3 kg (322 lb 8.5 oz) (!) 146.1 kg (322 lb 1.5 oz)    Examination:  General exam: Appears calm and comfortable.  Looks older than stated age.  No acute distress Respiratory system: Bilateral decreased breath sound at bases with scattered crackles mostly in the bases, no wheezing Cardiovascular system: Rate controlled, S1-S2 heard gastrointestinal system: Abdomen is morbidly obese nondistended, soft and nontender. Normal bowel sounds heard. Extremities: No cyanosis; trace lower extremity edema Skin: No rash, petechiae Lymph: No cervical lymphadenopathy CNS: Alert and awake, moving extremities.  No focal neurologic deficit Psych: Normal mood affect and judgment   Data Reviewed: I have personally reviewed following labs and imaging studies  CBC: Recent Labs  Lab 02/05/18 1825  02/07/18 0337 02/08/18 0530 02/09/18 0527 02/10/18 0514 02/11/18 0432  WBC 11.4*   < > 12.0* 11.7* 11.3* 11.2* 12.7*  NEUTROABS 9.3*  --   --   --   --   --   --   HGB 14.3   < > 13.7 12.7 12.3 12.4 12.9  HCT 43.9   < > 43.6 40.2 39.2 39.6 40.6  MCV 91.8   < > 93.4 93.3 93.8 93.2 92.7  PLT 213   < > 200 207 205 222 221   < > = values in this interval not displayed.   Basic Metabolic Panel: Recent Labs  Lab 02/06/18 0324 02/07/18 0337 02/09/18 0527 02/10/18 0514 02/11/18 0432  NA 137 138 138 139 140  K 4.6 4.4 4.4 4.5 3.8  CL 103 104 105 107 105  CO2 22 24 23 23 23   GLUCOSE 118* 107* 126* 120* 138*  BUN 28* 26* 24* 22* 24*  CREATININE 1.61* 1.39* 1.31* 1.20* 1.41*  CALCIUM 9.4 9.1 9.3 9.3 9.2  MG  --  2.1 2.1 2.1 1.9   GFR: Estimated  Creatinine Clearance: 55.9 mL/min (A) (by C-G formula based on SCr of 1.41 mg/dL (H)). Liver Function Tests: No results for input(s): AST, ALT, ALKPHOS, BILITOT, PROT, ALBUMIN in the last 168 hours. No results for input(s): LIPASE, AMYLASE in the last 168 hours. No results for input(s): AMMONIA in the last 168 hours. Coagulation Profile: No results for input(s): INR, PROTIME in the last 168 hours. Cardiac Enzymes: Recent Labs  Lab 02/05/18 1825  TROPONINI 0.48*   BNP (last 3 results) Recent Labs    06/11/17 1200 06/25/17 1147  PROBNP 574* 200   HbA1C: No results for input(s): HGBA1C in the last 72 hours. CBG: Recent Labs  Lab 02/05/18 2342 02/06/18 0322 02/06/18 2010  GLUCAP 127* 116* 209*   Lipid Profile: No results for input(s): CHOL, HDL, LDLCALC, TRIG, CHOLHDL, LDLDIRECT in the last 72 hours. Thyroid Function Tests: No results for input(s): TSH, T4TOTAL, FREET4, T3FREE, THYROIDAB in the last 72 hours. Anemia Panel: No results for input(s): VITAMINB12, FOLATE, FERRITIN, TIBC, IRON, RETICCTPCT in the  last 72 hours. Sepsis Labs: No results for input(s): PROCALCITON, LATICACIDVEN in the last 168 hours.  Recent Results (from the past 240 hour(s))  MRSA PCR Screening     Status: None   Collection Time: 02/06/18  1:00 AM  Result Value Ref Range Status   MRSA by PCR NEGATIVE NEGATIVE Final    Comment:        The GeneXpert MRSA Assay (FDA approved for NASAL specimens only), is one component of a comprehensive MRSA colonization surveillance program. It is not intended to diagnose MRSA infection nor to guide or monitor treatment for MRSA infections. Performed at [facility], [place] [address] [place], [place] [address]          Radiology Studies: Ct Abdomen Pelvis Wo Contrast  Result Date: 02/09/2018 CLINICAL DATA:  Colon cancer. EXAM: CT ABDOMEN AND PELVIS WITHOUT CONTRAST TECHNIQUE: Multidetector CT imaging of the abdomen and pelvis was  performed following the standard protocol without IV contrast. COMPARISON:  06/11/2017. FINDINGS: Lower chest: Subsegmental atelectasis noted posterior right lower lobe. Hepatobiliary: The liver shows diffusely decreased attenuation suggesting steatosis. No focal abnormality identified within the liver parenchyma on this noncontrast exam. Tiny calcified gallstones evident. No intrahepatic or extrahepatic biliary dilation. Pancreas: No focal mass lesion. No dilatation of the main duct. No intraparenchymal cyst. No peripancreatic edema. Spleen: No splenomegaly. No focal mass lesion. Adrenals/Urinary Tract: Similar appearance bilateral adrenal nodules measuring 17 mm on the right and 18 mm on the left. No hydronephrosis evident within either kidney. Stable 10 mm hypoattenuating lesion in the upper pole the right kidney. No evidence for hydroureter. The urinary bladder appears normal for the degree of distention. Stomach/Bowel: Stomach is nondistended. No gastric wall thickening. No evidence of outlet obstruction. Duodenum is normally positioned as is the ligament of Treitz. No small bowel wall thickening. No small bowel dilatation. The terminal ileum is normal. The appendix is normal. Diverticuli are seen scattered along the entire length of the colon without CT findings of diverticulitis. Vascular/Lymphatic: There is abdominal aortic atherosclerosis without aneurysm. There is no gastrohepatic or hepatoduodenal ligament lymphadenopathy. No intraperitoneal or retroperitoneal lymphadenopathy. No pelvic sidewall lymphadenopathy. Reproductive: Prominent low-attenuation again noted in the central cervix. Focal contour bulge right uterine fundus compatible with fibroid. Endometrium prominent in a postmenopausal female. There is no adnexal mass. Other: No intraperitoneal free fluid. Musculoskeletal: Bone windows reveal no worrisome lytic or sclerotic osseous lesions. IMPRESSION: 1. No evidence for metastatic disease in the  abdomen or pelvis on this noncontrast exam. 2. Apparent thickening of the cervical canal with similar appearance of prominent endometrium. Pelvic ultrasound recommended to further evaluate. 3. Cholelithiasis 4. Hepatic steatosis. 5. Stable small bilateral adrenal nodules. Attention on follow-up recommended. 6.  Aortic Atherosclerois (ICD10-170.0) Electronically Signed   By: Misty Stanley M.D.   On: 02/09/2018 15:02   Dg Chest Port 1 View  Result Date: 02/10/2018 CLINICAL DATA:  Worsening dyspnea and shortness of breath. EXAM: PORTABLE CHEST 1 VIEW COMPARISON:  02/09/2018. FINDINGS: Unchanged cardiomegaly with mild vascular congestion. No definite consolidation or edema. No osseous findings. Increased body habitus. IMPRESSION: Stable chest. Electronically Signed   By: Staci Righter M.D.   On: 02/10/2018 16:45   Dg Chest Port 1 View  Result Date: 02/09/2018 CLINICAL DATA:  Hypoxia EXAM: PORTABLE CHEST 1 VIEW COMPARISON:  None. FINDINGS: There is cardiomegaly with pulmonary vascular congestion. No overt edema. No focal consolidation, pleural effusion or pneumothorax. IMPRESSION: Cardiomegaly and pulmonary vascular congestion without overt pulmonary edema. Electronically Signed  By: Ulyses Jarred M.D.   On: 02/09/2018 19:40        Scheduled Meds: . amiodarone  200 mg Oral Daily  . diltiazem  240 mg Oral Daily  . enoxaparin (LOVENOX) injection  150 mg Subcutaneous Q12H  . ferrous gluconate  324 mg Oral TID WC  . furosemide  80 mg Intravenous Once  . metoprolol succinate  25 mg Oral Daily  . pantoprazole  40 mg Oral Daily   Continuous Infusions:   LOS: 6 days        Aline August, MD [facility] Pager [phone]  If 7PM-7AM, please contact night-coverage [email or web address] Password [email or web address] 02/11/2018, 7:57 AM

## 2018-02-11 NOTE — Progress Notes (Addendum)
Pt seen, lying in bed, no respiratory distress noted or voiced by pt at this time.  HR 71, rr23, spo2 93% on 12L [place] high flow nasal cannula.  Pt continues to refuse cpap/bipap stating the mask makes it harder for her to breathe.  Rx changed to prn.  Pt is agreeable to try heated high flow nasal cannula should her O2 saturations drop tonight.  RN aware.

## 2018-02-11 NOTE — Progress Notes (Signed)
PT Cancellation Note  Patient Details Name: Amber Medina MRN: [id number] DOB: October 18, 1950   Cancelled Treatment:     PT deferred this date.  Rn advises pt very SOB and on 12L O2.  Will follow.   Dezra Mandella 02/11/2018, 8:50 AM

## 2018-02-11 NOTE — Progress Notes (Signed)
Paged Bodenheimer NP to advise on patient status. Patient on continuous pulse ox with saturation fluctuating between 84-87%. Page returned with phone call. Informed Bodenheimer of patient's O2 saturation on 4L [place]. Patient not c/o SOB and does not appear to be in distress as evidenced by even/unlabored breathing. Bodenheimer gave verbal order to increase patient's O2 to 6L  and call back in a hour to report on patient's status. Patient's oxygen was increased to 6L and will continue to monitor and call back in one hour.

## 2018-02-11 NOTE — Progress Notes (Signed)
Bodenheimer NP paged after one hour for update on patient's O2 saturation. Page returned via phone call. RN informed Bodenheimer over the phone that there was no improvement on 6L [place]. Patient placed on 9L high flow oxygen. Bodenheimer made aware and stated that he will place order to transfer patient to ICU Stepdown for closer monitoring. Will continue with 9L high flow and continuous pulse ox until bed becomes available.

## 2018-02-12 ENCOUNTER — Other Ambulatory Visit: Payer: Self-pay | Admitting: *Deleted

## 2018-02-12 ENCOUNTER — Inpatient Hospital Stay (HOSPITAL_COMMUNITY): Payer: Medicare Other

## 2018-02-12 DIAGNOSIS — C189 Malignant neoplasm of colon, unspecified: Secondary | ICD-10-CM

## 2018-02-12 LAB — CBC
HEMATOCRIT: 42.1 % (ref 36.0–46.0)
HEMOGLOBIN: 13.1 g/dL (ref 12.0–15.0)
MCH: 28.9 pg (ref 26.0–34.0)
MCHC: 31.1 g/dL (ref 30.0–36.0)
MCV: 92.9 fL (ref 78.0–100.0)
Platelets: 229 10*3/uL (ref 150–400)
RBC: 4.53 MIL/uL (ref 3.87–5.11)
RDW: 14.5 % (ref 11.5–15.5)
WBC: 10.9 10*3/uL — AB (ref 4.0–10.5)

## 2018-02-12 LAB — COMPREHENSIVE METABOLIC PANEL
ALBUMIN: 3.1 g/dL — AB (ref 3.5–5.0)
ALK PHOS: 78 U/L (ref 38–126)
ALT: 23 U/L (ref 14–54)
AST: 20 U/L (ref 15–41)
Anion gap: 14 (ref 5–15)
BILIRUBIN TOTAL: 0.6 mg/dL (ref 0.3–1.2)
BUN: 27 mg/dL — AB (ref 6–20)
CALCIUM: 9.3 mg/dL (ref 8.9–10.3)
CO2: 26 mmol/L (ref 22–32)
CREATININE: 1.33 mg/dL — AB (ref 0.44–1.00)
Chloride: 104 mmol/L (ref 101–111)
GFR calc Af Amer: 46 mL/min — ABNORMAL LOW (ref >60)
GFR, EST NON AFRICAN AMERICAN: 40 mL/min — AB (ref >60)
GLUCOSE: 109 mg/dL — AB (ref 65–99)
Potassium: 3.9 mmol/L (ref 3.5–5.1)
Sodium: 144 mmol/L (ref 135–145)
TOTAL PROTEIN: 7.1 g/dL (ref 6.5–8.1)

## 2018-02-12 LAB — MAGNESIUM: MAGNESIUM: 2 mg/dL (ref 1.7–2.4)

## 2018-02-12 MED ORDER — FUROSEMIDE 10 MG/ML IJ SOLN
80.0000 mg | Freq: Two times a day (BID) | INTRAMUSCULAR | Status: DC
  Administered 2018-02-12 (×2): 80 mg via INTRAVENOUS
  Filled 2018-02-12 (×2): qty 8

## 2018-02-12 NOTE — Progress Notes (Signed)
Oncology short note.  Will schedule patient for clinic f/u in 2 weeks with me. (Scheduling msg sent to our cancer center schedulers) She will need surgery referral to Dr Leighton Ruff on discharge and f/u with PCP to co-ordinate cardiac and pulmonary clearance pre-operatively.  Sullivan Lone MD MS

## 2018-02-12 NOTE — Progress Notes (Signed)
Pt continues to refuse CPAP QHS.  Pt currently on 12 LPM [place] and tolerating well.  RT to monitor and assess as needed.

## 2018-02-12 NOTE — Progress Notes (Signed)
Pt's insurance ([employer]) authorized SNF admission to [facility] at [place]- #[id number]. Notified [facility]. Per Seth Bake at [facility], authorization valid for admission within 5-7 days- if pt admits after that will need new authorization request.   Sharren Bridge, MSW, [place] [facility] 02/12/2018 [phone]

## 2018-02-12 NOTE — Progress Notes (Signed)
Patient ID: Amber Medina, female   DOB: [birth year], 67 y.o.   MRN: [id number]  PROGRESS NOTE    Amber Medina  YHC:[id number] DOB: [birth year] DOA: 02/05/2018 PCP: [name], MD   Brief Narrative:  67 year old female with history of A. fib, chronic diastolic CHF, morbid obesity, colon cancer diagnosed back in September 2018 but has not received any treatment yet presented on 02/05/2018 with shortness of breath for 2 weeks.  She was found to be hypoxic and CT angiogram showed bilateral pulmonary embolism and evidence of right heart strain.  Patient is currently on Lovenox after discussion with oncology.  Assessment & Plan:   Principal Problem:   Acute pulmonary embolism with acute cor pulmonale (HCC) Active Problems:   Acute respiratory failure with hypoxia (HCC)   Chronic diastolic (congestive) heart failure (HCC)   Persistent atrial fibrillation (HCC)   Adenocarcinoma of colon (HCC)  Acute hypoxic respiratory failure secondary to pulmonary embolism -Respiratory status has worsened since yesterday.  Currently on 12 L high flow nasal cannula oxygen.  Patient refused to be on BiPAP yesterday -Repeat chest x-ray today. -Diuretic plan as below  Acute on chronic diastolic CHF -We will put on Lasix 80 mg IV every 12 hours. -Echo showed EF of 55 to 60% with grade 1 diastolic dysfunction. Monitor input and output.  Daily weights.  Acute bilateral pulmonary embolism with left popliteal vein DVT -In the setting of malignancy -Patient was initially started on heparin drip which was switched to Lovenox after prior hospitalist discussed the case with oncology. -Continue Lovenox for now. -Echo showed mild signs of right heart strain, mild systolic dysfunction noted on the right side  Generalized deconditioning - PT recommends nursing home placement.  Social worker consulted  Adenocarcinoma of the colon -Newly diagnosed in September 2018 -Patient has not received any treatment yet, apparently due to  insurance issues and was not able to have surgery done. -Patient needs outpatient arrangement of follow-up with general surgery/Dr. Marcello Moores -Outpatient follow-up with oncology/Dr.Kale  Paroxysmal atrial fibrillation -Currently rate controlled.  Continue amiodarone, Cardizem and metoprolol.  Continue anticoagulation with Lovenox -Prior to admission, patient was not on anticoagulation due to GI bleed in the past with Xarelto -Outpatient follow-up with cardiology  Acute kidney injury -Creatinine improving.  Repeat a.m. Labs.  Caution with use of diuretics  Morbid obesity -Outpatient follow-up     DVT prophylaxis: Lovenox full dose Code Status: Full Family Communication: None at bedside Disposition Plan: Nursing home once bed is available  Consultants: None  Procedures:  Echo on 02/06/2018 Study Conclusions  - Procedure narrative: Transthoracic echocardiography. Image   quality was suboptimal. - Left ventricle: The cavity size was normal. Systolic function was   normal. The estimated ejection fraction was in the range of 55%   to 60%. Doppler parameters are consistent with abnormal left   ventricular relaxation (grade 1 diastolic dysfunction). - Aortic valve: Mildly calcified annulus. Trileaflet; mildly   calcified leaflets. There was mild regurgitation. - Aorta: Mild ascending aortic dilatation. - Mitral valve: Mildly calcified annulus. Normal thickness leaflets   . - Right ventricle: Mild systolic dysfunction noted. - Right atrium: The atrium was mildly dilated. - Tricuspid valve: There was mild regurgitation.   Antimicrobials: None   Subjective: Patient seen and examined at bedside.  She denies any overnight fever, nausea, vomiting or chest pain.  Still feels very short of breath even with minimal exertion.  Objective: Vitals:   02/12/18 0100 02/12/18 0200 02/12/18 0312 02/12/18 0600  BP:    Marland Kitchen)  151/67  Pulse: 64 64  62  Resp: 17 17  15   Temp:   98.6 F (37 C)     TempSrc:   Oral   SpO2: 94% 95%  93%  Weight:      Height:        Intake/Output Summary (Last 24 hours) at 02/12/2018 0809 Last data filed at 02/12/2018 0300 Gross per 24 hour  Intake 500 ml  Output 1600 ml  Net -1100 ml   Filed Weights   02/05/18 1752 02/07/18 1326 02/11/18 0511  Weight: 136.1 kg (300 lb) (!) 146.3 kg (322 lb 8.5 oz) (!) 146.1 kg (322 lb 1.5 oz)    Examination:  General exam: No acute distress.  Looks older than stated age  respiratory system: Bilateral decreased breath sounds at bases with scattered crackles Cardiovascular system: S1 S2 heard, rate controlled gastrointestinal system: Abdomen is morbidly obese nondistended, soft and nontender. Normal bowel sounds heard. Extremities: 1+ edema, no cyanosis Skin: No rash, petechiae Lymph: No cervical lymphadenopathy CNS: Alert and awake, moving extremities.  No focal neurologic deficit Psych: Normal mood affect and judgment   Data Reviewed: I have personally reviewed following labs and imaging studies  CBC: Recent Labs  Lab 02/05/18 1825  02/08/18 0530 02/09/18 0527 02/10/18 0514 02/11/18 0432 02/12/18 0316  WBC 11.4*   < > 11.7* 11.3* 11.2* 12.7* 10.9*  NEUTROABS 9.3*  --   --   --   --   --   --   HGB 14.3   < > 12.7 12.3 12.4 12.9 13.1  HCT 43.9   < > 40.2 39.2 39.6 40.6 42.1  MCV 91.8   < > 93.3 93.8 93.2 92.7 92.9  PLT 213   < > 207 205 222 221 229   < > = values in this interval not displayed.   Basic Metabolic Panel: Recent Labs  Lab 02/07/18 0337 02/09/18 0527 02/10/18 0514 02/11/18 0432 02/12/18 0316  NA 138 138 139 140 144  K 4.4 4.4 4.5 3.8 3.9  CL 104 105 107 105 104  CO2 24 23 23 23 26   GLUCOSE 107* 126* 120* 138* 109*  BUN 26* 24* 22* 24* 27*  CREATININE 1.39* 1.31* 1.20* 1.41* 1.33*  CALCIUM 9.1 9.3 9.3 9.2 9.3  MG 2.1 2.1 2.1 1.9 2.0   GFR: Estimated Creatinine Clearance: 59.2 mL/min (A) (by C-G formula based on SCr of 1.33 mg/dL (H)). Liver Function Tests: Recent  Labs  Lab 02/12/18 0316  AST 20  ALT 23  ALKPHOS 78  BILITOT 0.6  PROT 7.1  ALBUMIN 3.1*   No results for input(s): LIPASE, AMYLASE in the last 168 hours. No results for input(s): AMMONIA in the last 168 hours. Coagulation Profile: No results for input(s): INR, PROTIME in the last 168 hours. Cardiac Enzymes: Recent Labs  Lab 02/05/18 1825 02/11/18 0814  TROPONINI 0.48* 0.03*   BNP (last 3 results) Recent Labs    06/11/17 1200 06/25/17 1147  PROBNP 574* 200   HbA1C: No results for input(s): HGBA1C in the last 72 hours. CBG: Recent Labs  Lab 02/05/18 2342 02/06/18 0322 02/06/18 2010  GLUCAP 127* 116* 209*   Lipid Profile: No results for input(s): CHOL, HDL, LDLCALC, TRIG, CHOLHDL, LDLDIRECT in the last 72 hours. Thyroid Function Tests: No results for input(s): TSH, T4TOTAL, FREET4, T3FREE, THYROIDAB in the last 72 hours. Anemia Panel: No results for input(s): VITAMINB12, FOLATE, FERRITIN, TIBC, IRON, RETICCTPCT in the last 72 hours. Sepsis Labs: No results  for input(s): PROCALCITON, LATICACIDVEN in the last 168 hours.  Recent Results (from the past 240 hour(s))  MRSA PCR Screening     Status: None   Collection Time: 02/06/18  1:00 AM  Result Value Ref Range Status   MRSA by PCR NEGATIVE NEGATIVE Final    Comment:        The GeneXpert MRSA Assay (FDA approved for NASAL specimens only), is one component of a comprehensive MRSA colonization surveillance program. It is not intended to diagnose MRSA infection nor to guide or monitor treatment for MRSA infections. Performed at [facility], [place] [address] [place], [place] [address]          Radiology Studies: Dg Chest Port 1 View  Result Date: 02/10/2018 CLINICAL DATA:  Worsening dyspnea and shortness of breath. EXAM: PORTABLE CHEST 1 VIEW COMPARISON:  02/09/2018. FINDINGS: Unchanged cardiomegaly with mild vascular congestion. No definite consolidation or edema. No osseous findings.  Increased body habitus. IMPRESSION: Stable chest. Electronically Signed   By: Staci Righter M.D.   On: 02/10/2018 16:45        Scheduled Meds: . amiodarone  200 mg Oral Daily  . diltiazem  240 mg Oral Daily  . enoxaparin (LOVENOX) injection  150 mg Subcutaneous Q12H  . ferrous gluconate  324 mg Oral TID WC  . furosemide  80 mg Intravenous BID  . metoprolol succinate  25 mg Oral Daily  . pantoprazole  40 mg Oral Daily   Continuous Infusions:   LOS: 7 days        Aline August, MD [facility] Pager [phone]  If 7PM-7AM, please contact night-coverage [email or web address] Password [email or web address] 02/12/2018, 8:09 AM

## 2018-02-13 LAB — BASIC METABOLIC PANEL
Anion gap: 12 (ref 5–15)
BUN: 34 mg/dL — ABNORMAL HIGH (ref 6–20)
CALCIUM: 9 mg/dL (ref 8.9–10.3)
CO2: 29 mmol/L (ref 22–32)
CREATININE: 1.65 mg/dL — AB (ref 0.44–1.00)
Chloride: 101 mmol/L (ref 101–111)
GFR calc Af Amer: 36 mL/min — ABNORMAL LOW (ref >60)
GFR calc non Af Amer: 31 mL/min — ABNORMAL LOW (ref >60)
Glucose, Bld: 121 mg/dL — ABNORMAL HIGH (ref 65–99)
Potassium: 3.9 mmol/L (ref 3.5–5.1)
SODIUM: 142 mmol/L (ref 135–145)

## 2018-02-13 LAB — CBC
HCT: 40.9 % (ref 36.0–46.0)
Hemoglobin: 12.7 g/dL (ref 12.0–15.0)
MCH: 29 pg (ref 26.0–34.0)
MCHC: 31.1 g/dL (ref 30.0–36.0)
MCV: 93.4 fL (ref 78.0–100.0)
PLATELETS: 218 10*3/uL (ref 150–400)
RBC: 4.38 MIL/uL (ref 3.87–5.11)
RDW: 14.3 % (ref 11.5–15.5)
WBC: 10.4 10*3/uL (ref 4.0–10.5)

## 2018-02-13 LAB — MAGNESIUM: MAGNESIUM: 2 mg/dL (ref 1.7–2.4)

## 2018-02-13 MED ORDER — FUROSEMIDE 10 MG/ML IJ SOLN
40.0000 mg | Freq: Two times a day (BID) | INTRAMUSCULAR | Status: DC
  Administered 2018-02-13 (×2): 40 mg via INTRAVENOUS
  Filled 2018-02-13 (×2): qty 4

## 2018-02-13 MED ORDER — MUPIROCIN 2 % EX OINT
TOPICAL_OINTMENT | Freq: Two times a day (BID) | CUTANEOUS | Status: DC
  Administered 2018-02-13 – 2018-02-14 (×3): via TOPICAL
  Filled 2018-02-13: qty 22

## 2018-02-13 MED ORDER — FUROSEMIDE 10 MG/ML IJ SOLN: 40.0000 mg | Freq: Two times a day (BID) | INTRAMUSCULAR | Status: DC

## 2018-02-13 NOTE — Progress Notes (Signed)
Patient ID: Amber Medina, female   DOB: 11/02/1950, 67 y.o.   MRN: [id number]  PROGRESS NOTE    Amber Medina  HCW:[id number] DOB: 25-Jul-1951 DOA: 02/05/2018 PCP: [name], MD   Brief Narrative:  67 year old female with history of A. fib, chronic diastolic CHF, morbid obesity, colon cancer diagnosed back in September 2018 but has not received any treatment yet presented on 02/05/2018 with shortness of breath for 2 weeks.  She was found to be hypoxic and CT angiogram showed bilateral pulmonary embolism and evidence of right heart strain.  Patient is currently on Lovenox after discussion with oncology.  Assessment & Plan:   Principal Problem:   Acute pulmonary embolism with acute cor pulmonale (HCC) Active Problems:   Acute respiratory failure with hypoxia (HCC)   Chronic diastolic (congestive) heart failure (HCC)   Persistent atrial fibrillation (HCC)   Adenocarcinoma of colon (HCC)  Acute hypoxic respiratory failure secondary to pulmonary embolism - Currently still on 12 L high flow nasal cannula oxygen.  Will attempt to wean down today.  Patient refused to be on BiPAP  -Spoke to Dr. Ander Slade about the patient and he agreed with the current plan of care. -Diuretic plan as below -Incentive spirometry  Acute on chronic diastolic CHF -Diuresing well.  Decrease Lasix to 40 mill grams IV every 12 hours because of slight bump in creatinine. -Echo showed EF of 55 to 60% with grade 1 diastolic dysfunction. Monitor input and output.  Daily weights.  Negative balance of 4140 ml since admission.  Acute bilateral pulmonary embolism with left popliteal vein DVT -In the setting of malignancy -Patient was initially started on heparin drip which was switched to Lovenox after prior hospitalist discussed the case with oncology. -Continue Lovenox for now. -Echo showed mild signs of right heart strain, mild systolic dysfunction noted on the right side  Generalized deconditioning - PT recommends  nursing home placement.  Social worker following  Adenocarcinoma of the colon -Newly diagnosed in September 2018 -Patient has not received any treatment yet, apparently due to insurance issues and was not able to have surgery done. -Patient needs outpatient arrangement of follow-up with general surgery/Dr. Marcello Moores -Outpatient follow-up with oncology/Dr.Kale  Paroxysmal atrial fibrillation -Currently rate controlled.  Continue amiodarone, Cardizem and metoprolol.  Continue anticoagulation with Lovenox -Prior to admission, patient was not on anticoagulation due to GI bleed in the past with Xarelto -Outpatient follow-up with cardiology  Acute kidney injury -Creatinine slightly worse compared to yesterday.  Repeat a.m. labs.  Diuretic plan as above  Morbid obesity -Outpatient follow-up     DVT prophylaxis: Lovenox full dose Code Status: Full Family Communication: Daughter at bedside Disposition Plan: Nursing home once bed is available  Consultants: Spoke to Dr. Lamonte Sakai on 02/13/2018 and discussed care  Procedures:  Echo on 02/06/2018 Study Conclusions  - Procedure narrative: Transthoracic echocardiography. Image   quality was suboptimal. - Left ventricle: The cavity size was normal. Systolic function was   normal. The estimated ejection fraction was in the range of 55%   to 60%. Doppler parameters are consistent with abnormal left   ventricular relaxation (grade 1 diastolic dysfunction). - Aortic valve: Mildly calcified annulus. Trileaflet; mildly   calcified leaflets. There was mild regurgitation. - Aorta: Mild ascending aortic dilatation. - Mitral valve: Mildly calcified annulus. Normal thickness leaflets   . - Right ventricle: Mild systolic dysfunction noted. - Right atrium: The atrium was mildly dilated. - Tricuspid valve: There was mild regurgitation.   Antimicrobials: None   Subjective: Patient  seen and examined at bedside.  Patient denies any worsening shortness of  breath, cough, chest pain.  She feels that her lower extremities are less swollen.  No overnight fever  Objective: Vitals:   02/13/18 0542 02/13/18 0550 02/13/18 0752 02/13/18 0800  BP:  (!) 102/41 123/69   Pulse:  (!) 58 (!) 58   Resp:  14 13   Temp:    98.6 F (37 C)  TempSrc:    Oral  SpO2:  95% 95%   Weight: (!) 143 kg (315 lb 4.1 oz)     Height:        Intake/Output Summary (Last 24 hours) at 02/13/2018 1136 Last data filed at 02/13/2018 0900 Gross per 24 hour  Intake 777 ml  Output 3500 ml  Net -2723 ml   Filed Weights   02/07/18 1326 02/11/18 0511 02/13/18 0542  Weight: (!) 146.3 kg (322 lb 8.5 oz) (!) 146.1 kg (322 lb 1.5 oz) (!) 143 kg (315 lb 4.1 oz)    Examination:  General exam: Looks older than stated age.  No distress respiratory system: Bilateral decreased breath sounds at bases with basilar crackles Cardiovascular system: S1-S2 positive, rate controlled gastrointestinal system: Abdomen is morbidly obese nondistended, soft and nontender. Normal bowel sounds heard. Extremities: 1+ edema which is improving, no cyanosis Skin: No petechiae, rash Lymph: No cervical lymphadenopathy CNS: Alert and awake, moving extremities.  No focal neurologic deficit Psych: Normal mood affect and judgment   Data Reviewed: I have personally reviewed following labs and imaging studies  CBC: Recent Labs  Lab 02/09/18 0527 02/10/18 0514 02/11/18 0432 02/12/18 0316 02/13/18 0341  WBC 11.3* 11.2* 12.7* 10.9* 10.4  HGB 12.3 12.4 12.9 13.1 12.7  HCT 39.2 39.6 40.6 42.1 40.9  MCV 93.8 93.2 92.7 92.9 93.4  PLT 205 222 221 229 973   Basic Metabolic Panel: Recent Labs  Lab 02/09/18 0527 02/10/18 0514 02/11/18 0432 02/12/18 0316 02/13/18 0341  NA 138 139 140 144 142  K 4.4 4.5 3.8 3.9 3.9  CL 105 107 105 104 101  CO2 23 23 23 26 29   GLUCOSE 126* 120* 138* 109* 121*  BUN 24* 22* 24* 27* 34*  CREATININE 1.31* 1.20* 1.41* 1.33* 1.65*  CALCIUM 9.3 9.3 9.2 9.3 9.0  MG  2.1 2.1 1.9 2.0 2.0   GFR: Estimated Creatinine Clearance: 47.1 mL/min (A) (by C-G formula based on SCr of 1.65 mg/dL (H)). Liver Function Tests: Recent Labs  Lab 02/12/18 0316  AST 20  ALT 23  ALKPHOS 78  BILITOT 0.6  PROT 7.1  ALBUMIN 3.1*   No results for input(s): LIPASE, AMYLASE in the last 168 hours. No results for input(s): AMMONIA in the last 168 hours. Coagulation Profile: No results for input(s): INR, PROTIME in the last 168 hours. Cardiac Enzymes: Recent Labs  Lab 02/11/18 0814  TROPONINI 0.03*   BNP (last 3 results) Recent Labs    06/11/17 1200 06/25/17 1147  PROBNP 574* 200   HbA1C: No results for input(s): HGBA1C in the last 72 hours. CBG: Recent Labs  Lab 02/06/18 2010  GLUCAP 209*   Lipid Profile: No results for input(s): CHOL, HDL, LDLCALC, TRIG, CHOLHDL, LDLDIRECT in the last 72 hours. Thyroid Function Tests: No results for input(s): TSH, T4TOTAL, FREET4, T3FREE, THYROIDAB in the last 72 hours. Anemia Panel: No results for input(s): VITAMINB12, FOLATE, FERRITIN, TIBC, IRON, RETICCTPCT in the last 72 hours. Sepsis Labs: No results for input(s): PROCALCITON, LATICACIDVEN in the last 168 hours.  Recent Results (from the past 240 hour(s))  MRSA PCR Screening     Status: None   Collection Time: 02/06/18  1:00 AM  Result Value Ref Range Status   MRSA by PCR NEGATIVE NEGATIVE Final    Comment:        The GeneXpert MRSA Assay (FDA approved for NASAL specimens only), is one component of a comprehensive MRSA colonization surveillance program. It is not intended to diagnose MRSA infection nor to guide or monitor treatment for MRSA infections. Performed at [facility], [place] [address] [place], [place] [address]          Radiology Studies: Dg Chest Port 1 View  Result Date: 02/12/2018 CLINICAL DATA:  Shortness of breath and cough EXAM: PORTABLE CHEST 1 VIEW COMPARISON:  02/10/2018 FINDINGS: Cardiac shadow is within  normal limits. The lungs are well aerated bilaterally. Vascular congestion is again identified. Patient is somewhat rotated to the left accentuating the mediastinal markings. Some patchy atelectatic changes are noted in the left mid lung. No bony abnormality is noted. IMPRESSION: Stable vascular congestion. Patchy left mid lung infiltrate. Electronically Signed   By: Inez Catalina M.D.   On: 02/12/2018 12:16        Scheduled Meds: . amiodarone  200 mg Oral Daily  . diltiazem  240 mg Oral Daily  . enoxaparin (LOVENOX) injection  150 mg Subcutaneous Q12H  . ferrous gluconate  324 mg Oral TID WC  . furosemide  40 mg Intravenous BID  . metoprolol succinate  25 mg Oral Daily  . pantoprazole  40 mg Oral Daily   Continuous Infusions:   LOS: 8 days        Aline August, MD [facility] Pager [phone]  If 7PM-7AM, please contact night-coverage [email or web address] Password [facility] 02/13/2018, 11:36 AM

## 2018-02-14 LAB — BASIC METABOLIC PANEL
Anion gap: 11 (ref 5–15)
BUN: 37 mg/dL — AB (ref 6–20)
CHLORIDE: 101 mmol/L (ref 101–111)
CO2: 27 mmol/L (ref 22–32)
CREATININE: 1.35 mg/dL — AB (ref 0.44–1.00)
Calcium: 8.8 mg/dL — ABNORMAL LOW (ref 8.9–10.3)
GFR calc Af Amer: 45 mL/min — ABNORMAL LOW (ref >60)
GFR calc non Af Amer: 39 mL/min — ABNORMAL LOW (ref >60)
Glucose, Bld: 96 mg/dL (ref 65–99)
POTASSIUM: 3.6 mmol/L (ref 3.5–5.1)
Sodium: 139 mmol/L (ref 135–145)

## 2018-02-14 LAB — CBC
HCT: 40.2 % (ref 36.0–46.0)
Hemoglobin: 12.5 g/dL (ref 12.0–15.0)
MCH: 28.9 pg (ref 26.0–34.0)
MCHC: 31.1 g/dL (ref 30.0–36.0)
MCV: 92.8 fL (ref 78.0–100.0)
Platelets: 234 10*3/uL (ref 150–400)
RBC: 4.33 MIL/uL (ref 3.87–5.11)
RDW: 14.1 % (ref 11.5–15.5)
WBC: 9.2 10*3/uL (ref 4.0–10.5)

## 2018-02-14 LAB — MAGNESIUM: Magnesium: 1.9 mg/dL (ref 1.7–2.4)

## 2018-02-14 MED ORDER — FUROSEMIDE 10 MG/ML IJ SOLN
60.0000 mg | Freq: Three times a day (TID) | INTRAMUSCULAR | Status: DC
  Administered 2018-02-14 – 2018-02-15 (×4): 60 mg via INTRAVENOUS
  Filled 2018-02-14 (×4): qty 6

## 2018-02-14 NOTE — Plan of Care (Signed)

## 2018-02-14 NOTE — Progress Notes (Signed)
BiPAP order remains prn, not at bedside, RT to monitor on rounds.

## 2018-02-14 NOTE — Progress Notes (Signed)
Patient ID: Amber Medina, female   DOB: 08-Jan-1951, 67 y.o.   MRN: [id number]  PROGRESS NOTE    Shekelia Boutin  WUJ:[id number] DOB: 1951/06/12 DOA: 02/05/2018 PCP: [name], MD   Brief Narrative:  67 year old female with history of A. fib, chronic diastolic CHF, morbid obesity, colon cancer diagnosed back in September 2018 but has not received any treatment yet presented on 02/05/2018 with shortness of breath for 2 weeks.  She was found to be hypoxic and CT angiogram showed bilateral pulmonary embolism and evidence of right heart strain.  Patient is currently on Lovenox after discussion with oncology.  Assessment & Plan:   Principal Problem:   Acute pulmonary embolism with acute cor pulmonale (HCC) Active Problems:   Acute respiratory failure with hypoxia (HCC)   Chronic diastolic (congestive) heart failure (HCC)   Persistent atrial fibrillation (HCC)   Adenocarcinoma of colon (HCC)  Acute hypoxic respiratory failure secondary to pulmonary embolism - Currently down to 6 L.  Will attempt to wean down as able.  Patient refused to be on BiPAP during the hospitalization -Spoke to Dr. Ander Slade about the patient 02/13/2018 and he agreed with the current plan of care. -Diuretic plan as below -Incentive spirometry -Transfer patient out of stepdown unit  Acute on chronic diastolic CHF -Diuresing well.  Increase Lasix to 60 milligrams IV every 8 hours as renal function is slightly better today -Echo showed EF of 55 to 60% with grade 1 diastolic dysfunction. Monitor input and output.  Daily weights.  Negative balance of 4060 ml since admission.  Acute bilateral pulmonary embolism with left popliteal vein DVT -In the setting of malignancy -Patient was initially started on heparin drip which was switched to Lovenox after prior hospitalist discussed the case with oncology. -Continue Lovenox for now. -Echo showed mild signs of right heart strain, mild systolic dysfunction noted on the right  side  Generalized deconditioning - PT recommends nursing home placement.  Social worker following  Adenocarcinoma of the colon -Newly diagnosed in September 2018 -Patient has not received any treatment yet, apparently due to insurance issues and was not able to have surgery done. -Patient needs outpatient arrangement of follow-up with general surgery/Dr. Marcello Moores -Outpatient follow-up with oncology/Dr.Kale  Paroxysmal atrial fibrillation -Currently rate controlled.  Continue amiodarone, Cardizem and metoprolol.  Continue anticoagulation with Lovenox -Prior to admission, patient was not on anticoagulation due to GI bleed in the past with Xarelto -Outpatient follow-up with cardiology  Acute kidney injury -Creatinine is better today.  Repeat a.m. labs.  Diuretic plan as above  Morbid obesity -Outpatient follow-up     DVT prophylaxis: Lovenox full dose Code Status: Full Family Communication: Daughter at bedside Disposition Plan: Nursing home once bed is available  Consultants: Spoke to Dr. Lamonte Sakai on 02/13/2018 and discussed care  Procedures:  Echo on 02/06/2018 Study Conclusions  - Procedure narrative: Transthoracic echocardiography. Image   quality was suboptimal. - Left ventricle: The cavity size was normal. Systolic function was   normal. The estimated ejection fraction was in the range of 55%   to 60%. Doppler parameters are consistent with abnormal left   ventricular relaxation (grade 1 diastolic dysfunction). - Aortic valve: Mildly calcified annulus. Trileaflet; mildly   calcified leaflets. There was mild regurgitation. - Aorta: Mild ascending aortic dilatation. - Mitral valve: Mildly calcified annulus. Normal thickness leaflets   . - Right ventricle: Mild systolic dysfunction noted. - Right atrium: The atrium was mildly dilated. - Tricuspid valve: There was mild regurgitation.   Antimicrobials: None  Subjective: Patient seen and examined at bedside.  Patient  feels slightly better today.  Her shortness of breath is improving.  She feels that her leg swelling is improving.  No overnight fever, nausea, vomiting or chest pain.  Objective: Vitals:   02/13/18 2327 02/14/18 0300 02/14/18 0600 02/14/18 0800  BP:  (!) 98/30    Pulse:  (!) 56    Resp:  15    Temp: 98.3 F (36.8 C)   97.9 F (36.6 C)  TempSrc: Oral   Oral  SpO2:  96%    Weight:   (!) 144.1 kg (317 lb 10.9 oz)   Height:        Intake/Output Summary (Last 24 hours) at 02/14/2018 0847 Last data filed at 02/14/2018 0000 Gross per 24 hour  Intake 1380 ml  Output 1000 ml  Net 380 ml   Filed Weights   02/11/18 0511 02/13/18 0542 02/14/18 0600  Weight: (!) 146.1 kg (322 lb 1.5 oz) (!) 143 kg (315 lb 4.1 oz) (!) 144.1 kg (317 lb 10.9 oz)    Examination:  General exam: No acute distress. respiratory system: Bilateral decreased breath sounds at bases with basilar crackles Cardiovascular system: S1-S2 positive, rate controlled gastrointestinal system: Abdomen is morbidly obese nondistended, soft and nontender. Normal bowel sounds heard. Extremities: 1+ edema, no cyanosis   Data Reviewed: I have personally reviewed following labs and imaging studies  CBC: Recent Labs  Lab 02/10/18 0514 02/11/18 0432 02/12/18 0316 02/13/18 0341 02/14/18 0336  WBC 11.2* 12.7* 10.9* 10.4 9.2  HGB 12.4 12.9 13.1 12.7 12.5  HCT 39.6 40.6 42.1 40.9 40.2  MCV 93.2 92.7 92.9 93.4 92.8  PLT 222 221 229 218 161   Basic Metabolic Panel: Recent Labs  Lab 02/10/18 0514 02/11/18 0432 02/12/18 0316 02/13/18 0341 02/14/18 0336  NA 139 140 144 142 139  K 4.5 3.8 3.9 3.9 3.6  CL 107 105 104 101 101  CO2 23 23 26 29 27   GLUCOSE 120* 138* 109* 121* 96  BUN 22* 24* 27* 34* 37*  CREATININE 1.20* 1.41* 1.33* 1.65* 1.35*  CALCIUM 9.3 9.2 9.3 9.0 8.8*  MG 2.1 1.9 2.0 2.0 1.9   GFR: Estimated Creatinine Clearance: 57.9 mL/min (A) (by C-G formula based on SCr of 1.35 mg/dL (H)). Liver Function  Tests: Recent Labs  Lab 02/12/18 0316  AST 20  ALT 23  ALKPHOS 78  BILITOT 0.6  PROT 7.1  ALBUMIN 3.1*   No results for input(s): LIPASE, AMYLASE in the last 168 hours. No results for input(s): AMMONIA in the last 168 hours. Coagulation Profile: No results for input(s): INR, PROTIME in the last 168 hours. Cardiac Enzymes: Recent Labs  Lab 02/11/18 0814  TROPONINI 0.03*   BNP (last 3 results) Recent Labs    06/11/17 1200 06/25/17 1147  PROBNP 574* 200   HbA1C: No results for input(s): HGBA1C in the last 72 hours. CBG: No results for input(s): GLUCAP in the last 168 hours. Lipid Profile: No results for input(s): CHOL, HDL, LDLCALC, TRIG, CHOLHDL, LDLDIRECT in the last 72 hours. Thyroid Function Tests: No results for input(s): TSH, T4TOTAL, FREET4, T3FREE, THYROIDAB in the last 72 hours. Anemia Panel: No results for input(s): VITAMINB12, FOLATE, FERRITIN, TIBC, IRON, RETICCTPCT in the last 72 hours. Sepsis Labs: No results for input(s): PROCALCITON, LATICACIDVEN in the last 168 hours.  Recent Results (from the past 240 hour(s))  MRSA PCR Screening     Status: None   Collection Time: 02/06/18  1:00  AM  Result Value Ref Range Status   MRSA by PCR NEGATIVE NEGATIVE Final    Comment:        The GeneXpert MRSA Assay (FDA approved for NASAL specimens only), is one component of a comprehensive MRSA colonization surveillance program. It is not intended to diagnose MRSA infection nor to guide or monitor treatment for MRSA infections. Performed at [facility], [place] [address], [place], [place] [address]          Radiology Studies: No results found.      Scheduled Meds: . amiodarone  200 mg Oral Daily  . diltiazem  240 mg Oral Daily  . enoxaparin (LOVENOX) injection  150 mg Subcutaneous Q12H  . ferrous gluconate  324 mg Oral TID WC  . furosemide  60 mg Intravenous Q8H  . metoprolol succinate  25 mg Oral Daily  . mupirocin ointment    Topical BID  . pantoprazole  40 mg Oral Daily   Continuous Infusions:   LOS: 9 days        Aline August, MD [facility] Pager [phone]  If 7PM-7AM, please contact night-coverage [email or web address] Password [facility] 02/14/2018, 8:47 AM

## 2018-02-15 ENCOUNTER — Telehealth: Payer: Self-pay | Admitting: Hematology

## 2018-02-15 ENCOUNTER — Telehealth: Payer: Self-pay | Admitting: *Deleted

## 2018-02-15 LAB — BASIC METABOLIC PANEL
ANION GAP: 13 (ref 5–15)
BUN: 38 mg/dL — ABNORMAL HIGH (ref 6–20)
CALCIUM: 8.8 mg/dL — AB (ref 8.9–10.3)
CHLORIDE: 100 mmol/L — AB (ref 101–111)
CO2: 26 mmol/L (ref 22–32)
CREATININE: 1.36 mg/dL — AB (ref 0.44–1.00)
GFR calc non Af Amer: 39 mL/min — ABNORMAL LOW (ref >60)
GFR, EST AFRICAN AMERICAN: 45 mL/min — AB (ref >60)
GLUCOSE: 83 mg/dL (ref 65–99)
Potassium: 3.5 mmol/L (ref 3.5–5.1)
Sodium: 139 mmol/L (ref 135–145)

## 2018-02-15 LAB — CBC
HEMATOCRIT: 39.5 % (ref 36.0–46.0)
HEMOGLOBIN: 12.3 g/dL (ref 12.0–15.0)
MCH: 28.5 pg (ref 26.0–34.0)
MCHC: 31.1 g/dL (ref 30.0–36.0)
MCV: 91.6 fL (ref 78.0–100.0)
Platelets: 243 10*3/uL (ref 150–400)
RBC: 4.31 MIL/uL (ref 3.87–5.11)
RDW: 14 % (ref 11.5–15.5)
WBC: 9.3 10*3/uL (ref 4.0–10.5)

## 2018-02-15 LAB — MAGNESIUM: Magnesium: 1.8 mg/dL (ref 1.7–2.4)

## 2018-02-15 MED ORDER — FUROSEMIDE 10 MG/ML IJ SOLN
40.0000 mg | Freq: Three times a day (TID) | INTRAMUSCULAR | Status: DC
  Administered 2018-02-15 – 2018-02-16 (×3): 40 mg via INTRAVENOUS
  Filled 2018-02-15 (×3): qty 4

## 2018-02-15 NOTE — Telephone Encounter (Signed)
Returned patient's phone call regarding insurance information. Instructed her to call Annamary Rummage, Estate manager/land agent, for [facility] at [phone].

## 2018-02-15 NOTE — Telephone Encounter (Signed)
Pt has been scheduled for a hospital follow up on 5/22 at 11am. Lft appt information on the pt's voicemail.

## 2018-02-15 NOTE — Progress Notes (Signed)
Nutrition Brief Note  Patient identified for LOS (day 10).  Wt Readings from Last 15 Encounters:  02/15/18 (!) 313 lb 4.4 oz (142.1 kg)  06/25/17 (!) 325 lb (147.4 kg)  06/11/17 (!) 325 lb 2.9 oz (147.5 kg)  06/11/17 (!) 322 lb (146.1 kg)  03/10/17 (!) 320 lb 12.8 oz (145.5 kg)  02/17/17 (!) 320 lb 12.8 oz (145.5 kg)  02/05/16 (!) 326 lb 6.4 oz (148.1 kg)  10/11/15 (!) 319 lb 6.4 oz (144.9 kg)  09/15/15 (!) 324 lb 8 oz (147.2 kg)    Body mass index is 50.56 kg/m. Patient meets criteria for morbid obesity based on current BMI. Pt has lost 12 lbs (3.7% body weight) in the past 7 months; this is not significant for time frame. Skin WDL. Pt with hx of afib, CHF, morbid obesity, colon cancer dx in 06/2017 but has not received treatment for cancer. She presented to the ED on 5/3 for SOB x2 weeks and was found to be hypoxic and to have bilateral pulmonary embolism with evidence of R heart strain.   Per Dr. Lindalou Hose note this AM, PT recommended nursing home placement but this AM pt declines going to a nursing home. Plan for PT re-evaluation. Plan for outpatient oncology follow-up. Pt is stable for d/c once disposition plan made.   Current diet order is Heart Healthy, patient is consuming approximately 100% of meals at this time.  Medications reviewed; 40 mg IV Lasix TID. Labs reviewed; Cl: 100 mmol/L, BUN: 38 mg/dL, creatinine: 1.36 mg/dL, Ca: 8.8 mg/dL, GFR: 39 mL/min.   No nutrition interventions warranted at this time. If nutrition issues arise, please consult RD.      Jarome Matin, MS, RD, LDN, [facility] Inpatient Clinical Dietitian Pager # [phone] After hours/weekend pager # [phone]

## 2018-02-15 NOTE — Progress Notes (Signed)
Physical Therapy Treatment Patient Details Name: Amber Medina MRN: [id number] DOB: Sep 26, 1951 Today's Date: 02/15/2018    History of Present Illness 67 yo female admitted with acute PE. Hx of A fib,morbid obesity, anemia, CHF, CKD    PT Comments    The patient is mobilizing well today. Ambulated 65' x 3 on RA, O2 saturation > 90%.  HR 82 max.  The patient plans to return home. Requesting a RW(not bariatric) for use in home. Continue PT.  Follow Up Recommendations  No PT follow up(patient declines at this time, reports home is very tight.)     Equipment Recommendations  Rolling walker with 5" wheels(regular in order to move through home.)    Recommendations for Other Services       Precautions / Restrictions Precautions Precautions: Fall Precaution Comments: monitor O2 sats    Mobility  Bed Mobility Overal bed mobility: Modified Independent Bed Mobility: Supine to Sit;Sit to Supine              Transfers Overall transfer level: Needs assistance Equipment used: Rolling walker (2 wheeled) Transfers: Sit to/from Stand Sit to Stand: Min guard         General transfer comment: no asssitance required  Ambulation/Gait Ambulation/Gait assistance: Min guard Ambulation Distance (Feet): 65 Feet(x 3, seated rest breaks) Assistive device: Rolling walker (2 wheeled) Gait Pattern/deviations: Step-through pattern;Wide base of support Gait velocity: decreased   General Gait Details: O2 saturation on RA lowest 90%, encouraged pursed lip breaths.    Ambulated x 10' without RW and no UE support from BR. Stairs             Wheelchair Mobility    Modified Rankin (Stroke Patients Only)       Balance                                            Cognition Arousal/Alertness: Awake/alert                                            Exercises      General Comments        Pertinent Vitals/Pain Faces Pain Scale: Hurts a little  bit Pain Location: back pain Pain Descriptors / Indicators: Constant Pain Intervention(s): Monitored during session    Home Living                      Prior Function            PT Goals (current goals can now be found in the care plan section) Progress towards PT goals: Progressing toward goals    Frequency    Min 3X/week      PT Plan Discharge plan needs to be updated    Co-evaluation              AM-PAC PT "6 Clicks" Daily Activity  Outcome Measure  Difficulty turning over in bed (including adjusting bedclothes, sheets and blankets)?: A Little Difficulty moving from lying on back to sitting on the side of the bed? : A Little Difficulty sitting down on and standing up from a chair with arms (e.g., wheelchair, bedside commode, etc,.)?: A Little Help needed moving to and from a bed to chair (including a wheelchair)?: A Little Help  needed walking in hospital room?: A Lot Help needed climbing 3-5 steps with a railing? : A Lot 6 Click Score: 16    End of Session   Activity Tolerance: Treatment limited secondary to medical complications (Comment) Patient left: in bed;with call bell/phone within reach Nurse Communication: Mobility status       Time: 0922-0955 PT Time Calculation (min) (ACUTE ONLY): 33 min  Charges:  $Gait Training: 23-37 mins                    G CodesTresa Endo PT [phone]    Claretha Cooper 02/15/2018, 11:32 AM

## 2018-02-15 NOTE — Progress Notes (Signed)
SATURATION QUALIFICATIONS: (This note is used to comply with regulatory documentation for home oxygen)  Patient Saturations on Room Air at Rest = 95%  Patient Saturations on Room Air while Ambulating = 90%   

## 2018-02-15 NOTE — Progress Notes (Signed)
Patient ID: Amber Medina, female   DOB: [birth year], 67 y.o.   MRN: [id number]  PROGRESS NOTE    Sherrelle Prochazka  RJJ:[id number] DOB: 01-09-1951 DOA: 02/05/2018 PCP: [name], MD   Brief Narrative:  67 year old female with history of A. fib, chronic diastolic CHF, morbid obesity, colon cancer diagnosed back in September 2018 but has not received any treatment yet presented on 02/05/2018 with shortness of breath for 2 weeks.  She was found to be hypoxic and CT angiogram showed bilateral pulmonary embolism and evidence of right heart strain.  Patient is currently on Lovenox after discussion with oncology.  Assessment & Plan:   Principal Problem:   Acute pulmonary embolism with acute cor pulmonale (HCC) Active Problems:   Acute respiratory failure with hypoxia (HCC)   Chronic diastolic (congestive) heart failure (HCC)   Persistent atrial fibrillation (HCC)   Adenocarcinoma of colon (HCC)  Acute hypoxic respiratory failure secondary to pulmonary embolism - Currently down to 1 L nasal cannula.  Wean off as able.  Patient refused to be on BiPAP during the hospitalization -Spoke to Dr. Ander Slade about the patient 02/13/2018 and he agreed with the current plan of care. -Diuretic plan as below -Incentive spirometry  Acute on chronic diastolic CHF -Diuresing well.  Decrease Lasix to 40 mill grams IV every 8 hours and switch to oral Lasix upon discharge -Echo showed EF of 55 to 60% with grade 1 diastolic dysfunction. Monitor input and output.  Daily weights.  Negative balance of 3480 ml since admission.  Acute bilateral pulmonary embolism with left popliteal vein DVT -In the setting of malignancy -Patient was initially started on heparin drip which was switched to Lovenox after prior hospitalist discussed the case with oncology. -Continue Lovenox for now. -Echo showed mild signs of right heart strain, mild systolic dysfunction noted on the right side  Generalized deconditioning - PT recommends  nursing home placement.  Social worker following -Patient this morning states that she does not want to go to nursing home.  Will get PT evaluation.  Patient might need home care.  Consult care management.  Adenocarcinoma of the colon -Newly diagnosed in September 2018 -Patient has not received any treatment yet, apparently due to insurance issues and was not able to have surgery done. -Patient needs outpatient arrangement of follow-up with general surgery/Dr. Marcello Moores -Outpatient follow-up with oncology/Dr.Kale  Paroxysmal atrial fibrillation -Currently rate controlled.  Continue amiodarone, Cardizem and metoprolol.  Continue anticoagulation with Lovenox -Prior to admission, patient was not on anticoagulation due to GI bleed in the past with Xarelto -Outpatient follow-up with cardiology  Acute kidney injury -Creatinine is stable today.  Repeat a.m. labs.  Diuretic plan as above  Morbid obesity -Outpatient follow-up     DVT prophylaxis: Lovenox full dose Code Status: Full Family Communication: Daughter at bedside Disposition Plan: Nursing home once bed is available  Consultants: Spoke to Dr. Lamonte Sakai on 02/13/2018 and discussed care  Procedures:  Echo on 02/06/2018 Study Conclusions  - Procedure narrative: Transthoracic echocardiography. Image   quality was suboptimal. - Left ventricle: The cavity size was normal. Systolic function was   normal. The estimated ejection fraction was in the range of 55%   to 60%. Doppler parameters are consistent with abnormal left   ventricular relaxation (grade 1 diastolic dysfunction). - Aortic valve: Mildly calcified annulus. Trileaflet; mildly   calcified leaflets. There was mild regurgitation. - Aorta: Mild ascending aortic dilatation. - Mitral valve: Mildly calcified annulus. Normal thickness leaflets   . - Right ventricle: Mild  systolic dysfunction noted. - Right atrium: The atrium was mildly dilated. - Tricuspid valve: There was mild  regurgitation.   Antimicrobials: None   Subjective: Patient seen and examined at bedside.  Patient feels much better.  No overnight fever, nausea or vomiting.  Her shortness of breath is improving.   Objective: Vitals:   02/14/18 2349 02/15/18 0500 02/15/18 0645 02/15/18 0800  BP: (!) 133/105  (!) 115/50   Pulse: 62  (!) 58   Resp: (!) 24  16   Temp:    97.9 F (36.6 C)  TempSrc:    Oral  SpO2: 95%  95%   Weight:  (!) 142.1 kg (313 lb 4.4 oz)    Height:        Intake/Output Summary (Last 24 hours) at 02/15/2018 0819 Last data filed at 02/15/2018 0600 Gross per 24 hour  Intake 780 ml  Output 200 ml  Net 580 ml   Filed Weights   02/13/18 0542 02/14/18 0600 02/15/18 0500  Weight: (!) 143 kg (315 lb 4.1 oz) (!) 144.1 kg (317 lb 10.9 oz) (!) 142.1 kg (313 lb 4.4 oz)    Examination:  General exam: No acute distress.  Calm and comfortable respiratory system: Bilateral decreased breath sounds at bases with some basilar crackles Cardiovascular system: S1-S2 positive, rate controlled gastrointestinal system: Abdomen is morbidly obese nondistended, soft and nontender. Normal bowel sounds heard. Extremities: Trace edema, no cyanosis  Data Reviewed: I have personally reviewed following labs and imaging studies  CBC: Recent Labs  Lab 02/11/18 0432 02/12/18 0316 02/13/18 0341 02/14/18 0336 02/15/18 0347  WBC 12.7* 10.9* 10.4 9.2 9.3  HGB 12.9 13.1 12.7 12.5 12.3  HCT 40.6 42.1 40.9 40.2 39.5  MCV 92.7 92.9 93.4 92.8 91.6  PLT 221 229 218 234 341   Basic Metabolic Panel: Recent Labs  Lab 02/11/18 0432 02/12/18 0316 02/13/18 0341 02/14/18 0336 02/15/18 0347  NA 140 144 142 139 139  K 3.8 3.9 3.9 3.6 3.5  CL 105 104 101 101 100*  CO2 23 26 29 27 26   GLUCOSE 138* 109* 121* 96 83  BUN 24* 27* 34* 37* 38*  CREATININE 1.41* 1.33* 1.65* 1.35* 1.36*  CALCIUM 9.2 9.3 9.0 8.8* 8.8*  MG 1.9 2.0 2.0 1.9 1.8   GFR: Estimated Creatinine Clearance: 56.9 mL/min (A) (by C-G  formula based on SCr of 1.36 mg/dL (H)). Liver Function Tests: Recent Labs  Lab 02/12/18 0316  AST 20  ALT 23  ALKPHOS 78  BILITOT 0.6  PROT 7.1  ALBUMIN 3.1*   No results for input(s): LIPASE, AMYLASE in the last 168 hours. No results for input(s): AMMONIA in the last 168 hours. Coagulation Profile: No results for input(s): INR, PROTIME in the last 168 hours. Cardiac Enzymes: Recent Labs  Lab 02/11/18 0814  TROPONINI 0.03*   BNP (last 3 results) Recent Labs    06/11/17 1200 06/25/17 1147  PROBNP 574* 200   HbA1C: No results for input(s): HGBA1C in the last 72 hours. CBG: No results for input(s): GLUCAP in the last 168 hours. Lipid Profile: No results for input(s): CHOL, HDL, LDLCALC, TRIG, CHOLHDL, LDLDIRECT in the last 72 hours. Thyroid Function Tests: No results for input(s): TSH, T4TOTAL, FREET4, T3FREE, THYROIDAB in the last 72 hours. Anemia Panel: No results for input(s): VITAMINB12, FOLATE, FERRITIN, TIBC, IRON, RETICCTPCT in the last 72 hours. Sepsis Labs: No results for input(s): PROCALCITON, LATICACIDVEN in the last 168 hours.  Recent Results (from the past 240 hour(s))  MRSA PCR Screening     Status: None   Collection Time: 02/06/18  1:00 AM  Result Value Ref Range Status   MRSA by PCR NEGATIVE NEGATIVE Final    Comment:        The GeneXpert MRSA Assay (FDA approved for NASAL specimens only), is one component of a comprehensive MRSA colonization surveillance program. It is not intended to diagnose MRSA infection nor to guide or monitor treatment for MRSA infections. Performed at [facility], [place] [address], [place], [place] [address]          Radiology Studies: No results found.      Scheduled Meds: . amiodarone  200 mg Oral Daily  . diltiazem  240 mg Oral Daily  . enoxaparin (LOVENOX) injection  150 mg Subcutaneous Q12H  . ferrous gluconate  324 mg Oral TID WC  . furosemide  60 mg Intravenous Q8H  .  metoprolol succinate  25 mg Oral Daily  . mupirocin ointment   Topical BID  . pantoprazole  40 mg Oral Daily   Continuous Infusions:   LOS: 10 days        Aline August, MD [facility] Pager [phone]  If 7PM-7AM, please contact night-coverage [email or web address] Password [facility] 02/15/2018, 8:19 AM

## 2018-02-15 NOTE — Telephone Encounter (Signed)
Received voice mail message from patient stating,"I need someone from your office to check and see if you take my insurance before I come on 02/24/18 to see Dr. Irene Limbo. I can't afford anymore out of pocket expenses. My return number is [phone].

## 2018-02-16 ENCOUNTER — Other Ambulatory Visit: Payer: Self-pay

## 2018-02-16 LAB — CBC
HEMATOCRIT: 39.8 % (ref 36.0–46.0)
Hemoglobin: 12.5 g/dL (ref 12.0–15.0)
MCH: 28.9 pg (ref 26.0–34.0)
MCHC: 31.4 g/dL (ref 30.0–36.0)
MCV: 92.1 fL (ref 78.0–100.0)
PLATELETS: 284 10*3/uL (ref 150–400)
RBC: 4.32 MIL/uL (ref 3.87–5.11)
RDW: 13.9 % (ref 11.5–15.5)
WBC: 7.9 10*3/uL (ref 4.0–10.5)

## 2018-02-16 LAB — MAGNESIUM: Magnesium: 1.9 mg/dL (ref 1.7–2.4)

## 2018-02-16 LAB — BASIC METABOLIC PANEL
ANION GAP: 14 (ref 5–15)
BUN: 36 mg/dL — AB (ref 6–20)
CO2: 30 mmol/L (ref 22–32)
Calcium: 9.2 mg/dL (ref 8.9–10.3)
Chloride: 100 mmol/L — ABNORMAL LOW (ref 101–111)
Creatinine, Ser: 1.24 mg/dL — ABNORMAL HIGH (ref 0.44–1.00)
GFR calc Af Amer: 50 mL/min — ABNORMAL LOW (ref >60)
GFR, EST NON AFRICAN AMERICAN: 43 mL/min — AB (ref >60)
GLUCOSE: 101 mg/dL — AB (ref 65–99)
POTASSIUM: 3.2 mmol/L — AB (ref 3.5–5.1)
Sodium: 144 mmol/L (ref 135–145)

## 2018-02-16 MED ORDER — ENOXAPARIN SODIUM 150 MG/ML ~~LOC~~ SOLN: 150.0000 mg | Freq: Two times a day (BID) | SUBCUTANEOUS | 0 refills | Status: DC

## 2018-02-16 MED ORDER — FUROSEMIDE 40 MG PO TABS: 40.0000 mg | ORAL_TABLET | Freq: Two times a day (BID) | ORAL | 0 refills | Status: DC

## 2018-02-16 MED ORDER — ENOXAPARIN SODIUM 150 MG/ML ~~LOC~~ SOLN: 1.0000 mg/kg | Freq: Two times a day (BID) | SUBCUTANEOUS | 0 refills | Status: DC

## 2018-02-16 MED ORDER — POTASSIUM CHLORIDE CRYS ER 20 MEQ PO TBCR
40.0000 meq | EXTENDED_RELEASE_TABLET | ORAL | Status: AC
  Administered 2018-02-16 (×2): 40 meq via ORAL
  Filled 2018-02-16 (×2): qty 2

## 2018-02-16 MED FILL — ENOXAPARIN 150 MG/ML SYR: 150 | 3 days supply | Qty: 6 | Fill #0

## 2018-02-16 NOTE — Discharge Summary (Signed)
Physician Discharge Summary  Amber Medina YNW:[id number] DOB: 30-Apr-1951 DOA: 02/05/2018  PCP: [name], MD  Admit date: 02/05/2018 Discharge date: 02/16/2018  Admitted From: Home Disposition: Home  Recommendations for Outpatient Follow-up:  1. Follow up with PCP in 1 week with repeat CBC/BMP 2. Follow-up with oncology as scheduled 3. Follow-up with surgery/Dr. Marcello Moores 4. Outpatient follow-up with cardiology   Home Health: No Equipment/Devices: None  Discharge Condition: Stable CODE STATUS: Full Diet recommendation: Heart Healthy/fluid restriction up to 1200 cc a day  Brief/Interim Summary: 67 year old female with history of A. fib, chronic diastolic CHF, morbid obesity, colon cancer diagnosed back in September 2018 but has not received any treatment yet presented on 02/05/2018 with shortness of breath for 2 weeks.  She was found to be hypoxic and CT angiogram showed bilateral pulmonary embolism and evidence of right heart strain.  Patient was put on Lovenox after discussion with oncology.  She had a long hospitalization with development of hypoxic respiratory failure.  She was treated with high doses of intravenous Lasix.  She diuresed well and gradually her oxygen requirement improved.  Currently she is on room air.  She will be discharged home on oral Lasix and Lovenox subcutaneously with outpatient follow-up with oncology/general surgery/cardiology and primary care provider.     Discharge Diagnoses:  Principal Problem:   Acute pulmonary embolism with acute cor pulmonale (HCC) Active Problems:   Acute respiratory failure with hypoxia (HCC)   Chronic diastolic (congestive) heart failure (HCC)   Persistent atrial fibrillation (HCC)   Adenocarcinoma of colon (HCC)  Acute hypoxic respiratory failure secondary to pulmonary embolism -Resolved.  Patient was requiring up to 12 L high flow nasal cannula oxygen.  She was diuresed with intravenous Lasix.  Currently on room air  -Spoke to Dr.  Ander Slade about the patient 02/13/2018 and he agreed with the current plan of care.  Acute on chronic diastolic CHF -Diuresing well.    Was on high dose of intravenous Lasix and is currently on  40 milligrams IV every 8 hours.  We will switch to 40 mg twice a day of Lasix.  Outpatient follow-up with primary care provider within a week with repeat BMP.  Outpatient follow-up with cardiology as well -Echo showed EF of 55 to 60% with grade 1 diastolic dysfunction.   Negative balance of 4250 ml since admission.  Acute bilateral pulmonary embolism with left popliteal vein DVT -In the setting of malignancy -Patient was initially started on heparin drip which was switched to Lovenox after prior hospitalist discussed the case with oncology. -Continue Lovenox for now. -Outpatient follow-up with oncology  Generalized deconditioning -PT initially recommended nursing home placement.  Social worker was consulted.  After improvement in condition, patient refused nursing home placement.  She will be discharged home  Adenocarcinoma of the colon -Newly diagnosed in September 2018 -Patient has not received any treatment yet, apparently due to insurance issues and was not able to have surgery done. -Patient needs outpatient arrangement of follow-up with general surgery/Dr. Marcello Moores -Outpatient follow-up with oncology/Dr.Kale  Paroxysmal atrial fibrillation -Currently rate controlled.  Continue amiodarone, Cardizem and metoprolol.  Continue anticoagulation with Lovenox -Prior to admission, patient was not on anticoagulation due to GI bleed in the past with Xarelto -Outpatient follow-up with cardiology  Acute kidney injury -Creatinine is stable today.    Outpatient follow-up with primary care provider  Morbid obesity -Outpatient follow-up    Discharge Instructions  Discharge Instructions    Ambulatory referral to Cardiology   Complete by:  As directed    CHF followup   Call MD for:   difficulty breathing, headache or visual disturbances   Complete by:  As directed    Call MD for:  extreme fatigue   Complete by:  As directed    Call MD for:  hives   Complete by:  As directed    Call MD for:  persistant dizziness or light-headedness   Complete by:  As directed    Call MD for:  persistant nausea and vomiting   Complete by:  As directed    Call MD for:  redness, tenderness, or signs of infection (pain, swelling, redness, odor or green/yellow discharge around incision site)   Complete by:  As directed    Call MD for:  temperature >100.4   Complete by:  As directed    Diet - low sodium heart healthy   Complete by:  As directed    Increase activity slowly   Complete by:  As directed      Allergies as of 02/16/2018      Reactions   Compazine [prochlorperazine Edisylate] Swelling      Medication List    TAKE these medications   acetaminophen 500 MG tablet Commonly known as:  TYLENOL Take 1,000 mg by mouth every 8 (eight) hours as needed for mild pain or moderate pain.   ALIVE WOMENS 50+ PO Take by mouth.   amiodarone 200 MG tablet Commonly known as:  PACERONE Take 1 tablet (200 mg total) by mouth daily.   CALCIUM PO Take by mouth.   CO Q 10 PO Take by mouth.   diltiazem 240 MG 24 hr capsule Commonly known as:  CARDIZEM CD Take 1 capsule (240 mg total) by mouth daily.   enoxaparin 150 MG/ML injection Commonly known as:  LOVENOX Inject 1 mL (150 mg total) into the skin every 12 (twelve) hours.   ferrous gluconate 324 MG tablet Commonly known as:  FERGON Take 1 tablet (324 mg total) by mouth 3 (three) times daily with meals.   furosemide 40 MG tablet Commonly known as:  LASIX Take 1 tablet (40 mg total) by mouth 2 (two) times daily. What changed:  when to take this   metoprolol succinate 25 MG 24 hr tablet Commonly known as:  TOPROL-XL take 1 tablet by mouth once daily   potassium chloride SA 20 MEQ tablet Commonly known as:   K-DUR,KLOR-CON Take 1 tablet (20 mEq total) 2 (two) times daily by mouth.   TURMERIC PO Take by mouth.   VISINE OP Place 1-2 drops into both eyes daily as needed (allergies/dry eye).   VITAMIN C PO Take by mouth.            Durable Medical Equipment  (From admission, onward)        Start     Ordered   02/16/18 0856  For home use only DME Walker rolling  Once    Question:  Patient needs a walker to treat with the following condition  Answer:  Fear for personal safety   02/16/18 0855      Contact information for follow-up providers    [name], MD. Schedule an appointment as soon as possible for a visit in 1 week(s).   Specialty:  Internal Medicine Why:  with repeat CBC/BMP Contact information: [id number] [address] [phone]        Leighton Ruff, MD. Schedule an appointment as soon as possible for a visit in 1 week(s).  Specialty:  General Surgery Contact information: [place] [address] [phone]            Contact information for after-discharge care    [facility] .   Service:  Skilled Nursing Contact information: [place] [phone]                 Allergies  Allergen Reactions  . Compazine [Prochlorperazine Edisylate] Swelling    Consultations: Spoke to Dr. Lamonte Sakai on 02/13/2018 and discussed care  Procedures/Studies: Ct Abdomen Pelvis Wo Contrast  Result Date: 02/09/2018 CLINICAL DATA:  Colon cancer. EXAM: CT ABDOMEN AND PELVIS WITHOUT CONTRAST TECHNIQUE: Multidetector CT imaging of the abdomen and pelvis was performed following the standard protocol without IV contrast. COMPARISON:  06/11/2017. FINDINGS: Lower chest: Subsegmental atelectasis noted posterior right lower lobe. Hepatobiliary: The liver shows diffusely decreased attenuation suggesting steatosis. No focal abnormality identified within the liver  parenchyma on this noncontrast exam. Tiny calcified gallstones evident. No intrahepatic or extrahepatic biliary dilation. Pancreas: No focal mass lesion. No dilatation of the main duct. No intraparenchymal cyst. No peripancreatic edema. Spleen: No splenomegaly. No focal mass lesion. Adrenals/Urinary Tract: Similar appearance bilateral adrenal nodules measuring 17 mm on the right and 18 mm on the left. No hydronephrosis evident within either kidney. Stable 10 mm hypoattenuating lesion in the upper pole the right kidney. No evidence for hydroureter. The urinary bladder appears normal for the degree of distention. Stomach/Bowel: Stomach is nondistended. No gastric wall thickening. No evidence of outlet obstruction. Duodenum is normally positioned as is the ligament of Treitz. No small bowel wall thickening. No small bowel dilatation. The terminal ileum is normal. The appendix is normal. Diverticuli are seen scattered along the entire length of the colon without CT findings of diverticulitis. Vascular/Lymphatic: There is abdominal aortic atherosclerosis without aneurysm. There is no gastrohepatic or hepatoduodenal ligament lymphadenopathy. No intraperitoneal or retroperitoneal lymphadenopathy. No pelvic sidewall lymphadenopathy. Reproductive: Prominent low-attenuation again noted in the central cervix. Focal contour bulge right uterine fundus compatible with fibroid. Endometrium prominent in a postmenopausal female. There is no adnexal mass. Other: No intraperitoneal free fluid. Musculoskeletal: Bone windows reveal no worrisome lytic or sclerotic osseous lesions. IMPRESSION: 1. No evidence for metastatic disease in the abdomen or pelvis on this noncontrast exam. 2. Apparent thickening of the cervical canal with similar appearance of prominent endometrium. Pelvic ultrasound recommended to further evaluate. 3. Cholelithiasis 4. Hepatic steatosis. 5. Stable small bilateral adrenal nodules. Attention on follow-up  recommended. 6.  Aortic Atherosclerois (ICD10-170.0) Electronically Signed   By: Misty Stanley M.D.   On: 02/09/2018 15:02   Dg Chest 2 View  Result Date: 02/05/2018 CLINICAL DATA:  Short of breath EXAM: CHEST - 2 VIEW COMPARISON:  09/06/2015 FINDINGS: Limited by habitus and soft tissues. The heart appears slightly enlarged. There is probable mild vascular congestion. Small pleural effusions. Increased opacity at the left base on frontal view likely largely related to overlying soft tissue as no correlate seen on the lateral view. IMPRESSION: 1. Mild cardiomegaly with central congestion 2. Small pleural effusions. Electronically Signed   By: Donavan Foil M.D.   On: 02/05/2018 19:28   Ct Angio Chest Pe W And/or Wo Contrast  Result Date: 02/05/2018 CLINICAL DATA:  Short of breath. Increase in lower extremity swelling. Concern for pulmonary embolism. Positive D-dimer. EXAM: CT ANGIOGRAPHY CHEST WITH CONTRAST TECHNIQUE: Multidetector CT imaging of the chest was performed using the standard  protocol during bolus administration of intravenous contrast. Multiplanar CT image reconstructions and MIPs were obtained to evaluate the vascular anatomy. CONTRAST:  58mL ISOVUE-370 IOPAMIDOL (ISOVUE-370) INJECTION 76% COMPARISON:  None. FINDINGS: Cardiovascular: Proximal filling defect within the RIGHT upper lobe pulmonary artery and RIGHT lower lobe pulmonary artery consistent with acute pulmonary emboli. Emboli extend to the lower lobe segmental pulmonary arteries on the RIGHT. Filling defect within the proximal lingula pulmonary artery as well as the segmental branches of the LEFT lower lobe pulmonary arteries. Distal segmental emboli in the LEFT upper lobe pulmonary artery. The RIGHT ventricular to LEFT ventricular ratio is greater than 1 (41 mm / 17 mm equals 2.4) No acute findings aorta great vessels.  No pericardial effusion. Mediastinum/Nodes: No axillary supraclavicular adenopathy. No mediastinal hilar adenopathy. No  pericardial effusion. Esophagus. Lungs/Pleura: No pulmonary infarction.  No infiltrate or edema. Upper Abdomen: Limited view of the liver, kidneys, pancreas are unremarkable. Normal adrenal glands. Probable small adenoma of the LEFT adrenal gland. No aggressive osseous lesion Musculoskeletal: No aggressive osseous lesion Review of the MIP images confirms the above findings. IMPRESSION: 1. Bilateral pulmonary emboli within the proximal and segmental branches of the RIGHT upper lobe and lower lobe as well as the segmental branches of the LEFT upper lobe and LEFT lower lobe. Overall clot burden is severe. 2. CT evidence of right heart strain (RV/LV Ratio = 2.4) consistent with at least submassive (intermediate risk) PE. The presence of right heart strain has been associated with an increased risk of morbidity and mortality. Please activate Code PE by paging [phone]. Critical Value/emergent results were called by telephone at the time of interpretation on 02/05/2018 at 9:04 pm to Dr. Carmon Sails , who verbally acknowledged these results. Electronically Signed   By: Suzy Bouchard M.D.   On: 02/05/2018 21:06   Dg Chest Port 1 View  Result Date: 02/12/2018 CLINICAL DATA:  Shortness of breath and cough EXAM: PORTABLE CHEST 1 VIEW COMPARISON:  02/10/2018 FINDINGS: Cardiac shadow is within normal limits. The lungs are well aerated bilaterally. Vascular congestion is again identified. Patient is somewhat rotated to the left accentuating the mediastinal markings. Some patchy atelectatic changes are noted in the left mid lung. No bony abnormality is noted. IMPRESSION: Stable vascular congestion. Patchy left mid lung infiltrate. Electronically Signed   By: Inez Catalina M.D.   On: 02/12/2018 12:16   Dg Chest Port 1 View  Result Date: 02/10/2018 CLINICAL DATA:  Worsening dyspnea and shortness of breath. EXAM: PORTABLE CHEST 1 VIEW COMPARISON:  02/09/2018. FINDINGS: Unchanged cardiomegaly with mild vascular  congestion. No definite consolidation or edema. No osseous findings. Increased body habitus. IMPRESSION: Stable chest. Electronically Signed   By: Staci Righter M.D.   On: 02/10/2018 16:45   Dg Chest Port 1 View  Result Date: 02/09/2018 CLINICAL DATA:  Hypoxia EXAM: PORTABLE CHEST 1 VIEW COMPARISON:  None. FINDINGS: There is cardiomegaly with pulmonary vascular congestion. No overt edema. No focal consolidation, pleural effusion or pneumothorax. IMPRESSION: Cardiomegaly and pulmonary vascular congestion without overt pulmonary edema. Electronically Signed   By: Ulyses Jarred M.D.   On: 02/09/2018 19:40    Echo on 02/06/2018 Study Conclusions  - Procedure narrative: Transthoracic echocardiography. Image quality was suboptimal. - Left ventricle: The cavity size was normal. Systolic function was normal. The estimated ejection fraction was in the range of 55% to 60%. Doppler parameters are consistent with abnormal left ventricular relaxation (grade 1 diastolic dysfunction). - Aortic valve: Mildly calcified annulus. Trileaflet; mildly calcified leaflets.  There was mild regurgitation. - Aorta: Mild ascending aortic dilatation. - Mitral valve: Mildly calcified annulus. Normal thickness leaflets . - Right ventricle: Mild systolic dysfunction noted. - Right atrium: The atrium was mildly dilated. - Tricuspid valve: There was mild regurgitation.   Subjective: Patient seen and examined at bedside.  She feels better.  No overnight fever, nausea, vomiting.  Her shortness of breath has much improved.  Discharge Exam: Vitals:   02/15/18 2237 02/16/18 0503  BP: (!) 143/64 (!) 114/58  Pulse: 65 61  Resp: 18 18  Temp: 97.8 F (36.6 C) 97.8 F (36.6 C)  SpO2: 96% 94%   Vitals:   02/15/18 2100 02/15/18 2237 02/16/18 0503 02/16/18 0532  BP: (!) 143/64 (!) 143/64 (!) 114/58   Pulse: 64 65 61   Resp:  18 18   Temp:  97.8 F (36.6 C) 97.8 F (36.6 C)   TempSrc:  Oral Oral   SpO2:   96% 94%   Weight:    (!) 143.1 kg (315 lb 8.2 oz)  Height:        General: Pt is alert, awake, not in acute distress Cardiovascular: Rate controlled, S1/S2 + Respiratory: Bilateral decreased breath sounds at bases Abdominal: Soft, NT, ND, bowel sounds + Extremities: Trace edema, no cyanosis    The results of significant diagnostics from this hospitalization (including imaging, microbiology, ancillary and laboratory) are listed below for reference.     Microbiology: No results found for this or any previous visit (from the past 240 hour(s)).   Labs: BNP (last 3 results) Recent Labs    02/05/18 1825  BNP 696.7*   Basic Metabolic Panel: Recent Labs  Lab 02/12/18 0316 02/13/18 0341 02/14/18 0336 02/15/18 0347 02/16/18 0415  NA 144 142 139 139 144  K 3.9 3.9 3.6 3.5 3.2*  CL 104 101 101 100* 100*  CO2 26 29 27 26 30   GLUCOSE 109* 121* 96 83 101*  BUN 27* 34* 37* 38* 36*  CREATININE 1.33* 1.65* 1.35* 1.36* 1.24*  CALCIUM 9.3 9.0 8.8* 8.8* 9.2  MG 2.0 2.0 1.9 1.8 1.9   Liver Function Tests: Recent Labs  Lab 02/12/18 0316  AST 20  ALT 23  ALKPHOS 78  BILITOT 0.6  PROT 7.1  ALBUMIN 3.1*   No results for input(s): LIPASE, AMYLASE in the last 168 hours. No results for input(s): AMMONIA in the last 168 hours. CBC: Recent Labs  Lab 02/12/18 0316 02/13/18 0341 02/14/18 0336 02/15/18 0347 02/16/18 0415  WBC 10.9* 10.4 9.2 9.3 7.9  HGB 13.1 12.7 12.5 12.3 12.5  HCT 42.1 40.9 40.2 39.5 39.8  MCV 92.9 93.4 92.8 91.6 92.1  PLT 229 218 234 243 284   Cardiac Enzymes: Recent Labs  Lab 02/11/18 0814  TROPONINI 0.03*   BNP: Invalid input(s): POCBNP CBG: No results for input(s): GLUCAP in the last 168 hours. D-Dimer No results for input(s): DDIMER in the last 72 hours. Hgb A1c No results for input(s): HGBA1C in the last 72 hours. Lipid Profile No results for input(s): CHOL, HDL, LDLCALC, TRIG, CHOLHDL, LDLDIRECT in the last 72 hours. Thyroid function  studies No results for input(s): TSH, T4TOTAL, T3FREE, THYROIDAB in the last 72 hours.  Invalid input(s): FREET3 Anemia work up No results for input(s): VITAMINB12, FOLATE, FERRITIN, TIBC, IRON, RETICCTPCT in the last 72 hours. Urinalysis    Component Value Date/Time   COLORURINE YELLOW 02/09/2018 2322   APPEARANCEUR CLEAR 02/09/2018 2322   LABSPEC 1.009 02/09/2018 2322   PHURINE 5.0  02/09/2018 2322   GLUCOSEU NEGATIVE 02/09/2018 2322   HGBUR MODERATE (A) 02/09/2018 2322   BILIRUBINUR NEGATIVE 02/09/2018 2322   KETONESUR NEGATIVE 02/09/2018 2322   PROTEINUR NEGATIVE 02/09/2018 2322   NITRITE NEGATIVE 02/09/2018 2322   LEUKOCYTESUR MODERATE (A) 02/09/2018 2322   Sepsis Labs Invalid input(s): PROCALCITONIN,  WBC,  LACTICIDVEN Microbiology No results found for this or any previous visit (from the past 240 hour(s)).   Time coordinating discharge: 35 minutes  SIGNED:   Aline August, MD  [facility] 02/16/2018, 11:24 AM Pager: [phone]  If 7PM-7AM, please contact night-coverage [email or web address] Password [email or web address]

## 2018-02-16 NOTE — Progress Notes (Signed)
A call was made to [employer] for information on pt assistance program and to ask for Lovenox coupons. There are no coupons for Lovenox and pt will not qualify for pt assistant program related to her having insurance and part D.

## 2018-02-16 NOTE — Progress Notes (Signed)
Patient is stable for discharge. Discharge instructions and medications have been reviewed with the patient and all questions answered. AVS and prescriptions given to patient.  

## 2018-02-16 NOTE — Care Management Important Message (Signed)
Important Message  Patient Details  Name: Caoimhe Damron MRN: [id number] Date of Birth: 30-Arlenne-1952   Medicare Important Message Given:  Yes    Kerin Salen 02/16/2018, 11:51 AMImportant Message  Patient Details  Name: Aerilyn Slee MRN: [id number] Date of Birth: 06-26-1951   Medicare Important Message Given:  Yes    Kerin Salen 02/16/2018, 11:51 AM

## 2018-02-17 ENCOUNTER — Encounter: Payer: Self-pay | Admitting: Hematology

## 2018-02-17 NOTE — Progress Notes (Signed)
Pt called stating her ins is not in network with [facility] and should she cancel her lab appointment.  I informed her [facility] will bill her ins as normal, if they leave her with a balance she could set up a payment plan with the hospital but she could definitely keep her appointment.  I also advised that she talk with her dr to see if she could get her labs done at a facility that is in network with her ins.  She verbalized understanding.

## 2018-02-19 ENCOUNTER — Telehealth: Payer: Self-pay

## 2018-02-19 NOTE — Telephone Encounter (Signed)
Pt spoke last week with Lenise, [employer], and voiced concern regarding lab work being billed differently. Pt cannot afford to have lab work completed at [facility] as insurance is now viewed as "out-of-network." Called pt to discuss further. At this time, provider prefers not to have pt lab work completed outside of the facility. Pt intent to talk to PCP this week for "in-network" referral and plans to cancel appointment with Dr. Irene Limbo. Appointment note placed designating pt intent to cancel. Appointment not removed at this time.

## 2018-02-22 ENCOUNTER — Other Ambulatory Visit: Payer: Self-pay | Admitting: Cardiology

## 2018-02-22 MED ORDER — DILTIAZEM HCL ER COATED BEADS 240 MG PO CP24: 240.0000 mg | ORAL_CAPSULE | Freq: Every day | ORAL | 0 refills | Status: DC

## 2018-02-24 ENCOUNTER — Inpatient Hospital Stay: Payer: Medicare (Managed Care) | Attending: Hematology | Admitting: Hematology

## 2018-02-24 ENCOUNTER — Inpatient Hospital Stay: Payer: Medicare (Managed Care)

## 2018-04-09 ENCOUNTER — Other Ambulatory Visit: Payer: Self-pay | Admitting: Cardiology

## 2018-04-12 ENCOUNTER — Other Ambulatory Visit: Payer: Self-pay | Admitting: Cardiology

## 2018-05-14 ENCOUNTER — Encounter: Payer: Self-pay | Admitting: Gastroenterology

## 2018-07-25 ENCOUNTER — Other Ambulatory Visit: Payer: Self-pay | Admitting: Cardiology

## 2018-07-29 ENCOUNTER — Other Ambulatory Visit: Payer: Self-pay | Admitting: Cardiology

## 2018-07-30 ENCOUNTER — Other Ambulatory Visit: Payer: Self-pay | Admitting: Cardiology

## 2018-09-06 ENCOUNTER — Other Ambulatory Visit: Payer: Self-pay | Admitting: Cardiology

## 2018-09-07 NOTE — Telephone Encounter (Signed)
Outpatient Medication Detail    Disp Refills Start End   diltiazem (CARTIA XT) 240 MG 24 hr capsule 30 capsule 0 09/06/2018    Sig - Route: Take 1 capsule (240 mg total) by mouth daily. - Oral   Sent to pharmacy as: diltiazem (CARTIA XT) 240 MG 24 hr capsule   Notes to Pharmacy: Please call the office at [phone] to set up yearly follow up for further refills. Thank you. 1st attempt.   E-Prescribing Status: Receipt confirmed by pharmacy (09/06/2018 12:36 PM EST)   Pharmacy   [id number] - [address], [address]

## 2018-09-12 ENCOUNTER — Other Ambulatory Visit: Payer: Self-pay | Admitting: Cardiology

## 2018-10-08 ENCOUNTER — Other Ambulatory Visit: Payer: Self-pay | Admitting: Cardiology

## 2018-12-11 ENCOUNTER — Other Ambulatory Visit: Payer: Self-pay | Admitting: Cardiology

## 2018-12-13 ENCOUNTER — Other Ambulatory Visit: Payer: Self-pay | Admitting: Cardiology

## 2019-01-18 ENCOUNTER — Other Ambulatory Visit: Payer: Self-pay | Admitting: Cardiology

## 2019-02-26 ENCOUNTER — Other Ambulatory Visit: Payer: Self-pay | Admitting: Cardiology

## 2019-03-01 ENCOUNTER — Other Ambulatory Visit: Payer: Self-pay | Admitting: Cardiology

## 2019-08-10 ENCOUNTER — Other Ambulatory Visit: Payer: Self-pay | Admitting: Cardiology

## 2019-08-10 MED ORDER — AMIODARONE HCL 200 MG PO TABS: ORAL_TABLET | ORAL | 0 refills | Status: DC

## 2019-08-13 ENCOUNTER — Emergency Department (HOSPITAL_COMMUNITY): Payer: Medicare HMO

## 2019-08-13 ENCOUNTER — Encounter (HOSPITAL_COMMUNITY): Payer: Self-pay

## 2019-08-13 ENCOUNTER — Inpatient Hospital Stay (HOSPITAL_COMMUNITY)
Admission: EM | Admit: 2019-08-13 | Discharge: 2019-08-23 | DRG: 357 | Disposition: A | Discharge status: Skilled Nursing Facility | Payer: Medicare HMO | Attending: Student | Admitting: Student

## 2019-08-13 ENCOUNTER — Other Ambulatory Visit: Payer: Self-pay

## 2019-08-13 DIAGNOSIS — Z20828 Contact with and (suspected) exposure to other viral communicable diseases: Secondary | ICD-10-CM | POA: Diagnosis present

## 2019-08-13 DIAGNOSIS — K1379 Other lesions of oral mucosa: Secondary | ICD-10-CM | POA: Diagnosis not present

## 2019-08-13 DIAGNOSIS — Z86711 Personal history of pulmonary embolism: Secondary | ICD-10-CM | POA: Diagnosis not present

## 2019-08-13 DIAGNOSIS — N1831 Chronic kidney disease, stage 3a: Secondary | ICD-10-CM | POA: Diagnosis present

## 2019-08-13 DIAGNOSIS — Z85038 Personal history of other malignant neoplasm of large intestine: Secondary | ICD-10-CM | POA: Diagnosis not present

## 2019-08-13 DIAGNOSIS — Z79899 Other long term (current) drug therapy: Secondary | ICD-10-CM | POA: Diagnosis not present

## 2019-08-13 DIAGNOSIS — R7989 Other specified abnormal findings of blood chemistry: Secondary | ICD-10-CM | POA: Diagnosis not present

## 2019-08-13 DIAGNOSIS — R627 Adult failure to thrive: Secondary | ICD-10-CM | POA: Diagnosis present

## 2019-08-13 DIAGNOSIS — F064 Anxiety disorder due to known physiological condition: Secondary | ICD-10-CM | POA: Diagnosis present

## 2019-08-13 DIAGNOSIS — I10 Essential (primary) hypertension: Secondary | ICD-10-CM | POA: Diagnosis not present

## 2019-08-13 DIAGNOSIS — G4733 Obstructive sleep apnea (adult) (pediatric): Secondary | ICD-10-CM | POA: Diagnosis present

## 2019-08-13 DIAGNOSIS — T380X5A Adverse effect of glucocorticoids and synthetic analogues, initial encounter: Secondary | ICD-10-CM | POA: Diagnosis not present

## 2019-08-13 DIAGNOSIS — I4819 Other persistent atrial fibrillation: Secondary | ICD-10-CM | POA: Diagnosis present

## 2019-08-13 DIAGNOSIS — J302 Other seasonal allergic rhinitis: Secondary | ICD-10-CM | POA: Diagnosis present

## 2019-08-13 DIAGNOSIS — C787 Secondary malignant neoplasm of liver and intrahepatic bile duct: Secondary | ICD-10-CM | POA: Diagnosis not present

## 2019-08-13 DIAGNOSIS — I509 Heart failure, unspecified: Secondary | ICD-10-CM | POA: Diagnosis not present

## 2019-08-13 DIAGNOSIS — E669 Obesity, unspecified: Secondary | ICD-10-CM | POA: Diagnosis not present

## 2019-08-13 DIAGNOSIS — Z713 Dietary counseling and surveillance: Secondary | ICD-10-CM

## 2019-08-13 DIAGNOSIS — Z87891 Personal history of nicotine dependence: Secondary | ICD-10-CM

## 2019-08-13 DIAGNOSIS — Z7189 Other specified counseling: Secondary | ICD-10-CM

## 2019-08-13 DIAGNOSIS — R531 Weakness: Secondary | ICD-10-CM

## 2019-08-13 DIAGNOSIS — E860 Dehydration: Secondary | ICD-10-CM | POA: Diagnosis present

## 2019-08-13 DIAGNOSIS — Z8249 Family history of ischemic heart disease and other diseases of the circulatory system: Secondary | ICD-10-CM

## 2019-08-13 DIAGNOSIS — R0981 Nasal congestion: Secondary | ICD-10-CM | POA: Diagnosis present

## 2019-08-13 DIAGNOSIS — I809 Phlebitis and thrombophlebitis of unspecified site: Secondary | ICD-10-CM | POA: Diagnosis not present

## 2019-08-13 DIAGNOSIS — Z6841 Body Mass Index (BMI) 40.0 and over, adult: Secondary | ICD-10-CM | POA: Diagnosis not present

## 2019-08-13 DIAGNOSIS — K769 Liver disease, unspecified: Secondary | ICD-10-CM | POA: Diagnosis present

## 2019-08-13 DIAGNOSIS — D638 Anemia in other chronic diseases classified elsewhere: Secondary | ICD-10-CM | POA: Diagnosis present

## 2019-08-13 DIAGNOSIS — Z7289 Other problems related to lifestyle: Secondary | ICD-10-CM

## 2019-08-13 DIAGNOSIS — Z7901 Long term (current) use of anticoagulants: Secondary | ICD-10-CM

## 2019-08-13 DIAGNOSIS — C786 Secondary malignant neoplasm of retroperitoneum and peritoneum: Principal | ICD-10-CM | POA: Diagnosis present

## 2019-08-13 DIAGNOSIS — R140 Abdominal distension (gaseous): Secondary | ICD-10-CM | POA: Diagnosis not present

## 2019-08-13 DIAGNOSIS — N179 Acute kidney failure, unspecified: Secondary | ICD-10-CM | POA: Diagnosis present

## 2019-08-13 DIAGNOSIS — R5381 Other malaise: Secondary | ICD-10-CM | POA: Diagnosis not present

## 2019-08-13 DIAGNOSIS — R970 Elevated carcinoembryonic antigen [CEA]: Secondary | ICD-10-CM | POA: Diagnosis not present

## 2019-08-13 DIAGNOSIS — F419 Anxiety disorder, unspecified: Secondary | ICD-10-CM | POA: Diagnosis not present

## 2019-08-13 DIAGNOSIS — C189 Malignant neoplasm of colon, unspecified: Secondary | ICD-10-CM

## 2019-08-13 DIAGNOSIS — C182 Malignant neoplasm of ascending colon: Secondary | ICD-10-CM | POA: Diagnosis not present

## 2019-08-13 DIAGNOSIS — D72825 Bandemia: Secondary | ICD-10-CM | POA: Diagnosis not present

## 2019-08-13 DIAGNOSIS — N189 Chronic kidney disease, unspecified: Secondary | ICD-10-CM | POA: Diagnosis not present

## 2019-08-13 DIAGNOSIS — I5032 Chronic diastolic (congestive) heart failure: Secondary | ICD-10-CM | POA: Diagnosis present

## 2019-08-13 DIAGNOSIS — Z9049 Acquired absence of other specified parts of digestive tract: Secondary | ICD-10-CM | POA: Diagnosis not present

## 2019-08-13 DIAGNOSIS — Z888 Allergy status to other drugs, medicaments and biological substances status: Secondary | ICD-10-CM | POA: Diagnosis not present

## 2019-08-13 DIAGNOSIS — D72829 Elevated white blood cell count, unspecified: Secondary | ICD-10-CM | POA: Diagnosis not present

## 2019-08-13 DIAGNOSIS — I130 Hypertensive heart and chronic kidney disease with heart failure and stage 1 through stage 4 chronic kidney disease, or unspecified chronic kidney disease: Secondary | ICD-10-CM | POA: Diagnosis present

## 2019-08-13 DIAGNOSIS — I4891 Unspecified atrial fibrillation: Secondary | ICD-10-CM | POA: Diagnosis not present

## 2019-08-13 DIAGNOSIS — Z86718 Personal history of other venous thrombosis and embolism: Secondary | ICD-10-CM

## 2019-08-13 DIAGNOSIS — R160 Hepatomegaly, not elsewhere classified: Secondary | ICD-10-CM

## 2019-08-13 DIAGNOSIS — D509 Iron deficiency anemia, unspecified: Secondary | ICD-10-CM | POA: Diagnosis not present

## 2019-08-13 HISTORY — DX: Essential (primary) hypertension: I10

## 2019-08-13 LAB — URINALYSIS, ROUTINE W REFLEX MICROSCOPIC
Bilirubin Urine: NEGATIVE
Cellular Cast, UA: 2
Glucose, UA: 50 mg/dL — AB
Ketones, ur: NEGATIVE mg/dL
Leukocytes,Ua: NEGATIVE
Nitrite: NEGATIVE
Protein, ur: 100 mg/dL — AB
Specific Gravity, Urine: 1.012 (ref 1.005–1.030)
pH: 5 (ref 5.0–8.0)

## 2019-08-13 LAB — PROTIME-INR
INR: 1.6 — ABNORMAL HIGH (ref 0.8–1.2)
Prothrombin Time: 18.6 seconds — ABNORMAL HIGH (ref 11.4–15.2)

## 2019-08-13 LAB — COMPREHENSIVE METABOLIC PANEL
ALT: 26 U/L (ref 0–44)
AST: 45 U/L — ABNORMAL HIGH (ref 15–41)
Albumin: 2.4 g/dL — ABNORMAL LOW (ref 3.5–5.0)
Alkaline Phosphatase: 256 U/L — ABNORMAL HIGH (ref 38–126)
Anion gap: 14 (ref 5–15)
BUN: 32 mg/dL — ABNORMAL HIGH (ref 8–23)
CO2: 17 mmol/L — ABNORMAL LOW (ref 22–32)
Calcium: 9.1 mg/dL (ref 8.9–10.3)
Chloride: 105 mmol/L (ref 98–111)
Creatinine, Ser: 2.82 mg/dL — ABNORMAL HIGH (ref 0.44–1.00)
GFR calc Af Amer: 19 mL/min — ABNORMAL LOW (ref >60)
GFR calc non Af Amer: 16 mL/min — ABNORMAL LOW (ref >60)
Glucose, Bld: 126 mg/dL — ABNORMAL HIGH (ref 70–99)
Potassium: 4.4 mmol/L (ref 3.5–5.1)
Sodium: 136 mmol/L (ref 135–145)
Total Bilirubin: 1.3 mg/dL — ABNORMAL HIGH (ref 0.3–1.2)
Total Protein: 7.3 g/dL (ref 6.5–8.1)

## 2019-08-13 LAB — CBC
HCT: 40.6 % (ref 36.0–46.0)
Hemoglobin: 12 g/dL (ref 12.0–15.0)
MCH: 26.5 pg (ref 26.0–34.0)
MCHC: 29.6 g/dL — ABNORMAL LOW (ref 30.0–36.0)
MCV: 89.6 fL (ref 80.0–100.0)
Platelets: 510 10*3/uL — ABNORMAL HIGH (ref 150–400)
RBC: 4.53 MIL/uL (ref 3.87–5.11)
RDW: 15.9 % — ABNORMAL HIGH (ref 11.5–15.5)
WBC: 20.5 10*3/uL — ABNORMAL HIGH (ref 4.0–10.5)
nRBC: 0 % (ref 0.0–0.2)

## 2019-08-13 LAB — HEMOGLOBIN A1C
Hgb A1c MFr Bld: 6.9 % — ABNORMAL HIGH (ref 4.8–5.6)
Mean Plasma Glucose: 151.33 mg/dL

## 2019-08-13 LAB — DIFFERENTIAL
Abs Immature Granulocytes: 0.37 10*3/uL — ABNORMAL HIGH (ref 0.00–0.07)
Basophils Absolute: 0.1 10*3/uL (ref 0.0–0.1)
Basophils Relative: 0 %
Eosinophils Absolute: 0 10*3/uL (ref 0.0–0.5)
Eosinophils Relative: 0 %
Immature Granulocytes: 2 %
Lymphocytes Relative: 6 %
Lymphs Abs: 1.2 10*3/uL (ref 0.7–4.0)
Monocytes Absolute: 1.5 10*3/uL — ABNORMAL HIGH (ref 0.1–1.0)
Monocytes Relative: 7 %
Neutro Abs: 17.4 10*3/uL — ABNORMAL HIGH (ref 1.7–7.7)
Neutrophils Relative %: 85 %

## 2019-08-13 LAB — TROPONIN I (HIGH SENSITIVITY): Troponin I (High Sensitivity): 28 ng/L — ABNORMAL HIGH (ref <18)

## 2019-08-13 LAB — CREATININE, SERUM
Creatinine, Ser: 2.51 mg/dL — ABNORMAL HIGH (ref 0.44–1.00)
GFR calc Af Amer: 22 mL/min — ABNORMAL LOW (ref >60)
GFR calc non Af Amer: 19 mL/min — ABNORMAL LOW (ref >60)

## 2019-08-13 LAB — LACTIC ACID, PLASMA
Lactic Acid, Venous: 1.6 mmol/L (ref 0.5–1.9)
Lactic Acid, Venous: 1.3 mmol/L (ref 0.5–1.9)

## 2019-08-13 LAB — TSH: TSH: 4.119 u[IU]/mL (ref 0.350–4.500)

## 2019-08-13 LAB — SARS CORONAVIRUS 2 (TAT 6-24 HRS): SARS Coronavirus 2: NEGATIVE

## 2019-08-13 LAB — HIV ANTIBODY (ROUTINE TESTING W REFLEX): HIV Screen 4th Generation wRfx: NONREACTIVE

## 2019-08-13 LAB — LIPASE, BLOOD: Lipase: 19 U/L (ref 11–51)

## 2019-08-13 LAB — BRAIN NATRIURETIC PEPTIDE: B Natriuretic Peptide: 85.6 pg/mL (ref 0.0–100.0)

## 2019-08-13 MED ORDER — DILTIAZEM HCL ER COATED BEADS 240 MG PO CP24
240.0000 mg | ORAL_CAPSULE | Freq: Every day | ORAL | Status: DC
  Administered 2019-08-13 – 2019-08-23 (×11): 240 mg via ORAL
  Filled 2019-08-13 (×12): qty 1

## 2019-08-13 MED ORDER — ACETAMINOPHEN 650 MG RE SUPP: 650.0000 mg | Freq: Four times a day (QID) | RECTAL | Status: DC | PRN

## 2019-08-13 MED ORDER — HEPARIN SODIUM (PORCINE) 5000 UNIT/ML IJ SOLN
5000.0000 [IU] | Freq: Three times a day (TID) | INTRAMUSCULAR | Status: DC
  Administered 2019-08-13 – 2019-08-14 (×3): 5000 [IU] via SUBCUTANEOUS
  Filled 2019-08-13 (×3): qty 1

## 2019-08-13 MED ORDER — SODIUM CHLORIDE 0.9 % IV BOLUS (SEPSIS): 1000.0000 mL | Freq: Once | INTRAVENOUS | Status: DC

## 2019-08-13 MED ORDER — ONDANSETRON HCL 4 MG/2ML IJ SOLN: 4.0000 mg | Freq: Four times a day (QID) | INTRAMUSCULAR | Status: DC | PRN

## 2019-08-13 MED ORDER — ONDANSETRON HCL 4 MG PO TABS: 4.0000 mg | ORAL_TABLET | Freq: Four times a day (QID) | ORAL | Status: DC | PRN

## 2019-08-13 MED ORDER — AMIODARONE HCL 200 MG PO TABS
100.0000 mg | ORAL_TABLET | Freq: Every day | ORAL | Status: DC
  Administered 2019-08-13 – 2019-08-23 (×11): 100 mg via ORAL
  Filled 2019-08-13 (×12): qty 1

## 2019-08-13 MED ORDER — SODIUM CHLORIDE 0.9 % IV SOLN
INTRAVENOUS | Status: DC
  Administered 2019-08-13 – 2019-08-14 (×2): via INTRAVENOUS

## 2019-08-13 MED ORDER — HYDROCODONE-ACETAMINOPHEN 5-325 MG PO TABS
1.0000 | ORAL_TABLET | ORAL | Status: DC | PRN
  Filled 2019-08-13 (×2): qty 2

## 2019-08-13 MED ORDER — ACETAMINOPHEN 325 MG PO TABS
650.0000 mg | ORAL_TABLET | Freq: Four times a day (QID) | ORAL | Status: DC | PRN
  Administered 2019-08-15 – 2019-08-22 (×4): 650 mg via ORAL
  Filled 2019-08-13 (×4): qty 2

## 2019-08-13 MED ORDER — SODIUM CHLORIDE 0.9 % IV SOLN
2.0000 g | INTRAVENOUS | Status: DC
  Administered 2019-08-13 – 2019-08-15 (×3): 2 g via INTRAVENOUS
  Filled 2019-08-13: qty 2
  Filled 2019-08-13: qty 20
  Filled 2019-08-13 (×2): qty 2

## 2019-08-13 MED ORDER — SODIUM CHLORIDE 0.9 % IV SOLN
INTRAVENOUS | Status: DC
  Administered 2019-08-13: 04:00:00 via INTRAVENOUS

## 2019-08-13 MED ORDER — SODIUM CHLORIDE 0.9% FLUSH
3.0000 mL | Freq: Once | INTRAVENOUS | Status: AC
  Administered 2019-08-13: 3 mL via INTRAVENOUS

## 2019-08-13 MED ORDER — METOPROLOL SUCCINATE ER 25 MG PO TB24
25.0000 mg | ORAL_TABLET | Freq: Every day | ORAL | Status: DC
  Administered 2019-08-13 – 2019-08-23 (×11): 25 mg via ORAL
  Filled 2019-08-13 (×12): qty 1

## 2019-08-13 MED ORDER — SODIUM CHLORIDE 0.9 % IV BOLUS (SEPSIS)
1000.0000 mL | Freq: Once | INTRAVENOUS | Status: AC
  Administered 2019-08-13: 1000 mL via INTRAVENOUS

## 2019-08-13 MED ORDER — IOHEXOL 300 MG/ML  SOLN: 30.0000 mL | Freq: Once | INTRAMUSCULAR | Status: DC | PRN

## 2019-08-13 NOTE — ED Notes (Signed)
ED TO INPATIENT HANDOFF REPORT  ED Nurse Name and Phone #: [phone]  S Name/Age/Gender Amber Medina 68 y.o. female Room/Bed: WA16/WA16  Code Status   Code Status: Prior  Home/SNF/Other Home Patient oriented to: self, place, time and situation Is this baseline? Yes   Triage Complete: Triage complete  Chief Complaint shortness of breath, generalized weakness  Triage Note Per EMS, patient coming from home with complaints of progressively worsening shortness of breath and generalized weakness. Patient states that these problems have been happening for 2-3 weeks. It has gotten so bad that patient is unable to take steps without becoming SOB. Patient also endorses N/V/D and loss of appetite that has been intermittent for the last few weeks but worse today.   Vitals with EMS:  HR 120  HR after fluids and 2 L of O2 was 90 SpO2 99%  Resting RR 20, tachypnea with exertion ( RR 36) BP 123/101 CBG 174      Allergies Allergies  Allergen Reactions  . Compazine [Prochlorperazine Edisylate] Swelling    Level of Care/Admitting Diagnosis ED Disposition    ED Disposition Condition [facility]: [place] [[id number]]  Level of Care: Med-Surg [16]  Covid Evaluation: N/A  Diagnosis: Abdominal distension [id number]  Admitting Physician: Verlee Monte [[id number]]  Attending Physician: Verlee Monte [[id number]]  Estimated length of stay: past midnight tomorrow  Certification:: I certify this patient will need inpatient services for at least 2 midnights  PT Class (Do Not Modify): Inpatient [101]  PT Acc Code (Do Not Modify): Private [1]       B Medical/Surgery History Past Medical History:  Diagnosis Date  . Abnormal pulmonary function test 02/2017  . Chronic diastolic (congestive) heart failure ([place]) 10/11/2015  . CKD (chronic kidney disease), stage II   . Colon cancer ([place])   . Hypertension   . Microcytic anemia    by labs 02/2017 -> adm 06/2017 with  severe anemia, found to have colon CA.  . Morbid obesity ([place])   . Nocturnal hypoxemia   . Persistent atrial fibrillation ([place])   . Snoring   . Suspected sleep apnea    Past Surgical History:  Procedure Laterality Date  . COLONOSCOPY WITH PROPOFOL N/A 06/13/2017   Procedure: COLONOSCOPY WITH PROPOFOL;  Surgeon: Carol Ada, MD;  Location: [facility];  Service: Endoscopy;  Laterality: N/A;  . ESOPHAGOGASTRODUODENOSCOPY (EGD) WITH PROPOFOL N/A 06/13/2017   Procedure: ESOPHAGOGASTRODUODENOSCOPY (EGD) WITH PROPOFOL;  Surgeon: Carol Ada, MD;  Location: [facility];  Service: Endoscopy;  Laterality: N/A;  . TONSILLECTOMY       A IV Location/Drains/Wounds Patient Lines/Drains/Airways Status   Active Line/Drains/Airways    Name:   Placement date:   Placement time:   Site:   Days:   Peripheral IV 08/13/19 Right Antecubital   08/13/19    0222    Antecubital   less than 1          Intake/Output Last 24 hours  Intake/Output Summary (Last 24 hours) at 08/13/2019 1147 Last data filed at 08/13/2019 [id number] Gross per 24 hour  Intake 1000 ml  Output 100 ml  Net 900 ml    Labs/Imaging Results for orders placed or performed during the hospital encounter of 08/13/19 (from the past 48 hour(s))  Lipase, blood     Status: None   Collection Time: 08/13/19  2:22 AM  Result Value Ref Range   Lipase 19 11 - 51 U/L    Comment: Performed at [facility] [address] [place], [place] [address]  Comprehensive metabolic panel     Status: Abnormal   Collection Time: 08/13/19  2:22 AM  Result Value Ref Range   Sodium 136 135 - 145 mmol/L   Potassium 4.4 3.5 - 5.1 mmol/L   Chloride 105 98 - 111 mmol/L   CO2 17 (L) 22 - 32 mmol/L   Glucose, Bld 126 (H) 70 - 99 mg/dL   BUN 32 (H) 8 - 23 mg/dL   Creatinine, Ser 2.82 (H) 0.44 - 1.00 mg/dL   Calcium 9.1 8.9 - 10.3 mg/dL   Total Protein 7.3 6.5 - 8.1 g/dL   Albumin 2.4 (L) 3.5 - 5.0 g/dL   AST 45 (H) 15 - 41 U/L   ALT 26 0 -  44 U/L   Alkaline Phosphatase 256 (H) 38 - 126 U/L   Total Bilirubin 1.3 (H) 0.3 - 1.2 mg/dL   GFR calc non Af Amer 16 (L) >60 mL/min   GFR calc Af Amer 19 (L) >60 mL/min   Anion gap 14 5 - 15    Comment: Performed at [facility], [place] [address] [place], [place] [address]  CBC     Status: Abnormal   Collection Time: 08/13/19  2:22 AM  Result Value Ref Range   WBC 20.5 (H) 4.0 - 10.5 K/uL   RBC 4.53 3.87 - 5.11 MIL/uL   Hemoglobin 12.0 12.0 - 15.0 g/dL   HCT 40.6 36.0 - 46.0 %   MCV 89.6 80.0 - 100.0 fL   MCH 26.5 26.0 - 34.0 pg   MCHC 29.6 (L) 30.0 - 36.0 g/dL   RDW 15.9 (H) 11.5 - 15.5 %   Platelets 510 (H) 150 - 400 K/uL   nRBC 0.0 0.0 - 0.2 %    Comment: Performed at [facility], [place] [address], [place], [place] [address]  Urinalysis, Routine w reflex microscopic     Status: Abnormal   Collection Time: 08/13/19  2:22 AM  Result Value Ref Range   Color, Urine AMBER (A) YELLOW    Comment: BIOCHEMICALS MAY BE AFFECTED BY COLOR   APPearance CLOUDY (A) CLEAR   Specific Gravity, Urine 1.012 1.005 - 1.030   pH 5.0 5.0 - 8.0   Glucose, UA 50 (A) NEGATIVE mg/dL   Hgb urine dipstick SMALL (A) NEGATIVE   Bilirubin Urine NEGATIVE NEGATIVE   Ketones, ur NEGATIVE NEGATIVE mg/dL   Protein, ur 100 (A) NEGATIVE mg/dL   Nitrite NEGATIVE NEGATIVE   Leukocytes,Ua NEGATIVE NEGATIVE   RBC / HPF 0-5 0 - 5 RBC/hpf   WBC, UA 6-10 0 - 5 WBC/hpf   Bacteria, UA RARE (A) NONE SEEN   Squamous Epithelial / LPF 0-5 0 - 5   Mucus PRESENT    Hyaline Casts, UA PRESENT    Cellular Cast, UA 2     Comment: Performed at [facility], [place] [address], [place],  [address]  Differential     Status: Abnormal   Collection Time: 08/13/19  2:22 AM  Result Value Ref Range   Neutrophils Relative % 85 %   Neutro Abs 17.4 (H) 1.7 - 7.7 K/uL   Lymphocytes Relative 6 %   Lymphs Abs 1.2 0.7 - 4.0 K/uL   Monocytes Relative 7 %   Monocytes Absolute  1.5 (H) 0.1 - 1.0 K/uL   Eosinophils Relative 0 %   Eosinophils Absolute 0.0 0.0 - 0.5 K/uL   Basophils Relative  0 %   Basophils Absolute 0.1 0.0 - 0.1 K/uL   Immature Granulocytes 2 %   Abs Immature Granulocytes 0.37 (H) 0.00 - 0.07 K/uL    Comment: Performed at [facility], [place] [address] [place], [place] [address]  Troponin I (High Sensitivity)     Status: Abnormal   Collection Time: 08/13/19  2:22 AM  Result Value Ref Range   Troponin I (High Sensitivity) 28 (H) <18 ng/L    Comment: (NOTE) Elevated high sensitivity troponin I (hsTnI) values and significant  changes across serial measurements may suggest ACS but many other  chronic and acute conditions are known to elevate hsTnI results.  Refer to the "Links" section for chest pain algorithms and additional  guidance. Performed at [facility], [place] [address] [place], [place] [address]   Brain natriuretic peptide     Status: None   Collection Time: 08/13/19  2:22 AM  Result Value Ref Range   B Natriuretic Peptide 85.6 0.0 - 100.0 pg/mL    Comment: Performed at [facility], [place] [address], [place], [place] [address]  Lactic acid, plasma     Status: None   Collection Time: 08/13/19  8:26 AM  Result Value Ref Range   Lactic Acid, Venous 1.6 0.5 - 1.9 mmol/L    Comment: Performed at [facility], [place] [address] [place],  [address]   Ct Abdomen Pelvis Wo Contrast  Result Date: 08/13/2019 CLINICAL DATA:  Abdominal distension.  History of colon cancer. EXAM: CT ABDOMEN AND PELVIS WITHOUT CONTRAST TECHNIQUE: Multidetector CT imaging of the abdomen and pelvis was performed following the standard protocol without IV contrast. COMPARISON:  02/09/2018 FINDINGS: Lower chest: Lung bases are normal. Hepatobiliary: There are multiple new ill-defined hypodense liver masses with significant nodular enlargement of the entire left lobe of the liver. Findings  suggest metastatic disease in this patient with known history of colon cancer. Biliary tree is normal. Cholelithiasis is present. Pancreas: Normal. Spleen: Normal. Adrenals/Urinary Tract: Adrenal glands demonstrates small stable bilateral nodules. Kidneys are normal in size without hydronephrosis or nephrolithiasis. Ureters and bladder are normal. Stomach/Bowel: Stomach and small bowel are normal. Mild diverticulosis of the colon. Findings suggest previous partial right colectomy. Vascular/Lymphatic: Mild calcified plaque over the abdominal aorta which is normal in caliber. No significant adenopathy. Reproductive: Possible small right-sided fibroid over the fundus unchanged. Ovaries unremarkable. Other: Multiple peritoneal masses over the left mid abdomen with the largest measuring 3.1 cm compatible with metastatic disease. Musculoskeletal: Degenerative change of the spine with significant disc disease at the L4-5 level. IMPRESSION: 1.  No acute findings in the abdomen/pelvis. 2. Interval development of multiple ill-defined hypodense liver masses with significant nodular enlargement of the entire left lobe of the liver suggesting metastatic disease in this patient with history of colon cancer. Multiple new peritoneal masses compatible with metastatic disease. 3.  Aortic Atherosclerosis (ICD10-I70.0). 4.  Colonic diverticulosis. Electronically Signed   By: Marin Olp M.D.   On: 08/13/2019 07:55   Dg Chest Portable 1 View  Result Date: 08/13/2019 CLINICAL DATA:  Cough EXAM: PORTABLE CHEST 1 VIEW COMPARISON:  Feb 12, 2018 FINDINGS: There is mild cardiomegaly. Aortic knob calcifications are seen. Both lungs are clear. The visualized skeletal structures are unremarkable. IMPRESSION: No active disease. Electronically Signed   By: Prudencio Pair M.D.   On: 08/13/2019 03:44    Pending Labs [employer] (From admission, onward)    Start     Ordered  08/13/19 0742  Lactic acid, plasma  Now then every 2 hours,    STAT     08/13/19 0741   08/13/19 0742  Blood culture (routine x 2)  BLOOD CULTURE X 2,   STAT     08/13/19 0741   08/13/19 0311  SARS CORONAVIRUS 2 (TAT 6-24 HRS) Nasopharyngeal Nasopharyngeal Swab  (Asymptomatic/Tier 2 Patients Labs)  ONCE - STAT,   STAT    Question Answer Comment  Is this test for diagnosis or screening Screening   Symptomatic for COVID-19 as defined by CDC No   Hospitalized for COVID-19 No   Admitted to ICU for COVID-19 No   Previously tested for COVID-19 No   Resident in a congregate (group) care setting No   Employed in healthcare setting No   Pregnant No      08/13/19 0310   Signed and Held  Creatinine, serum  (heparin)  Once,   R    Comments: Baseline for heparin therapy IF NOT ALREADY DRAWN.    Signed and Held   Signed and Held  HIV Antibody (routine testing w rflx)  (HIV Antibody (Routine testing w reflex) panel)  Once,   R     Signed and Held   Signed and Held  Protime-INR  Once,   R     Signed and Held   Signed and Held  TSH  Once,   R     Signed and Held   Signed and Held  Hemoglobin A1c  Once,   R     Signed and Held   Signed and Held  Basic metabolic panel  Daily,   R     Signed and Held   Signed and Held  CBC  Daily,   R     Signed and Held   Signed and Held  TSH  Once,   R     Signed and Held          Vitals/Pain Today's Vitals   08/13/19 0945 08/13/19 1000 08/13/19 1015 08/13/19 1030  BP:  (!) 111/55  123/63  Pulse: 73 71 70 72  Resp: 13 19 (!) 21 (!) 21  Temp:      TempSrc:      SpO2: 99% 98% 98% 99%  Weight:      Height:      PainSc:        Isolation Precautions No active isolations  Medications Medications  0.9 %  sodium chloride infusion ( Intravenous New Bag/Given 08/13/19 0420)  iohexol (OMNIPAQUE) 300 MG/ML solution 30 mL (has no administration in time range)  sodium chloride flush (NS) 0.9 % injection 3 mL (3 mLs Intravenous Given 08/13/19 0223)  sodium chloride 0.9 % bolus 1,000 mL (0 mLs Intravenous Stopped  08/13/19 0520)    Mobility walks Moderate fall risk   Focused Assessments Respiratory    R Recommendations: See Admitting Provider Note  Report given to:   Additional Notes: N/A

## 2019-08-13 NOTE — ED Notes (Signed)
Unable to get second blood culture. I set of culture sent to lab at this time.

## 2019-08-13 NOTE — Consult Note (Signed)
New Hematology/Oncology Consult   Requesting WR:UEAVW Elmahi        Reason for Consult: Colon Cancer  HPI: Amber Medina presented to the emergency room today with a report of progressive "weakness "over several months.  Amber Medina reports anorexia and tachycardia with exertion.  Amber Medina sat in her kitchen most of the day yesterday secondary to the inability to ambulate.  Amber Medina called EMS last night.  Amber Medina was diagnosed with stage II right-sided colon cancer (T4N0) in July 2019.  Amber Medina saw Dr. Harlow Asa in August 2019.  Amber Medina decided against adjuvant therapy.  Amber Medina reports Amber Medina has not returned for follow-up with oncology.    Past Medical History:  Diagnosis Date  . Abnormal pulmonary function test 02/2017  . Chronic diastolic (congestive) heart failure ([place]) 10/11/2015  . CKD (chronic kidney disease), stage II   . Colon cancer ([facility])-ascending colon mass, stage II (T4N0)   . Hypertension   . Microcytic anemia    by labs 02/2017 -> adm 06/2017 with severe anemia, found to have colon CA.  . Morbid obesity ([place])   . Nocturnal hypoxemia   . Persistent atrial fibrillation ([place])   . Snoring   . Suspected sleep apnea   :  Past Surgical History:  Procedure Laterality Date  . COLONOSCOPY WITH PROPOFOL N/A 06/13/2017   Procedure: COLONOSCOPY WITH PROPOFOL;  Surgeon: Carol Ada, MD;  [facility];  Service: Endoscopy;  Laterality: N/A;  . ESOPHAGOGASTRODUODENOSCOPY (EGD) WITH PROPOFOL N/A 06/13/2017   Procedure: ESOPHAGOGASTRODUODENOSCOPY (EGD) WITH PROPOFOL;  Surgeon: Carol Ada, MD;  [facility];  Service: Endoscopy;  Laterality: N/A;  . TONSILLECTOMY    :  .  Right colectomy 04/22/2018   Current Facility-Administered Medications:  .  0.9 %  sodium chloride infusion, , Intravenous, Continuous, Ward, Kristen N, DO, Last Rate: 125 mL/hr at 08/13/19 0420 .  iohexol (OMNIPAQUE) 300 MG/ML solution 30 mL, 30 mL, Oral, Once PRN, Ward, Kristen N, DO  Current Outpatient Medications:  .   amiodarone (PACERONE) 200 MG tablet, TAKE 1 TABLET BY MOUTH DAILY. Please make overdue appt with Dr. Radford Pax before anymore refills. 2nd attempt (Patient taking differently: Take 100 mg by mouth daily. ), Disp: 15 tablet, Rfl: 0 .  diltiazem (CARTIA XT) 240 MG 24 hr capsule, Take 1 capsule (240 mg total) by mouth daily. Please make overdue annual appt for future refills. [phone]. 2nd attempt. (Patient taking differently: Take 240 mg by mouth daily. ), Disp: 15 capsule, Rfl: 0 .  metoprolol succinate (TOPROL-XL) 25 MG 24 hr tablet, TAKE 1 TABLET BY MOUTH EVERY DAY. PLEASE MAKE AN APPOINTMENT WITH DOCTOR TURNER FOR FURHTER REFILLS. (Patient taking differently: Take 25 mg by mouth daily. ), Disp: 30 tablet, Rfl: 0 .  potassium chloride SA (K-DUR) 20 MEQ tablet, TAKE 1 TABLET BY MOUTH TWICE DAILY PLEASE SCHEDULE APPOINTMENT(OVERDUE) (Patient taking differently: Take 20 mEq by mouth 2 (two) times daily. ), Disp: 180 tablet, Rfl: 0 .  rivaroxaban (XARELTO) 20 MG TABS tablet, Take 20 mg by mouth daily with supper. , Disp: , Rfl:  .  enoxaparin (LOVENOX) 150 MG/ML injection, Inject 1 mL (150 mg total) into the skin every 12 (twelve) hours. (Patient not taking: Reported on 08/13/2019), Disp: 60 Syringe, Rfl: 0 .  enoxaparin (LOVENOX) 150 MG/ML injection, Inject 0.95 mLs (145 mg total) into the skin 2 (two) times daily., Disp: 2 Syringe, Rfl: 0 .  enoxaparin (LOVENOX) 150 MG/ML injection, Inject 1 mL (150 mg total) into the skin every 12 (  twelve) hours for 3 days. (Patient not taking: Reported on 08/13/2019), Disp: 6 Syringe, Rfl: 0 .  ferrous gluconate (FERGON) 324 MG tablet, Take 1 tablet (324 mg total) by mouth 3 (three) times daily with meals. (Patient not taking: Reported on 08/13/2019), Disp: 60 tablet, Rfl: 3 .  furosemide (LASIX) 40 MG tablet, Take 1 tablet (40 mg total) by mouth 2 (two) times daily. (Patient taking differently: Take 40 mg by mouth daily. ), Disp: 60 tablet, Rfl: 0:  :  Allergies   Allergen Reactions  . Compazine [Prochlorperazine Edisylate] Swelling  :  FH: Amber Medina believes her mother had cervical cancer  SOCIAL HISTORY: Amber Medina lives alone in [place].  Amber Medina is retired.  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  Amber Medina is not use cigarettes or alcohol.  Amber Medina was transfused with packed red blood cells when Amber Medina was diagnosed with colon cancer in 2018.  No risk factor for HIV or hepatitis.  Review of Systems:  Positives include: Malaise, anorexia, exertional dyspnea, tachycardia  A complete ROS was otherwise negative.   Physical Exam:  Blood pressure 123/63, pulse 72, temperature 97.7 F (36.5 C), temperature source Rectal, resp. rate (!) 21, height 5' 7"  (1.702 m), weight (!) 314 lb (142.4 kg), SpO2 99 %.  HEENT: The mouth is dry Lungs: Clear bilaterally Cardiac: Regular rate and rhythm Abdomen: No hepatosplenomegaly, obese, no mass Vascular: No leg edema Lymph nodes: No cervical, supraclavicular, axillary, or inguinal nodes Neurologic: Alert and oriented, follows commands Skin: Yeast rash at the lower abdominal skin fold and groin Musculoskeletal: No spine tenderness  LABS:  Recent Labs    08/13/19 0222  WBC 20.5*  HGB 12.0  HCT 40.6  PLT 510*    Recent Labs    08/13/19 0222  NA 136  K 4.4  CL 105  CO2 17*  GLUCOSE 126*  BUN 32*  CREATININE 2.82*  CALCIUM 9.1      RADIOLOGY:  Ct Abdomen Pelvis Wo Contrast  Result Date: 08/13/2019 CLINICAL DATA:  Abdominal distension.  History of colon cancer. EXAM: CT ABDOMEN AND PELVIS WITHOUT CONTRAST TECHNIQUE: Multidetector CT imaging of the abdomen and pelvis was performed following the standard protocol without IV contrast. COMPARISON:  02/09/2018 FINDINGS: Lower chest: Lung bases are normal. Hepatobiliary: There are multiple new ill-defined hypodense liver masses with significant nodular enlargement of the entire left lobe of the liver. Findings suggest metastatic disease in this patient with known history of colon  cancer. Biliary tree is normal. Cholelithiasis is present. Pancreas: Normal. Spleen: Normal. Adrenals/Urinary Tract: Adrenal glands demonstrates small stable bilateral nodules. Kidneys are normal in size without hydronephrosis or nephrolithiasis. Ureters and bladder are normal. Stomach/Bowel: Stomach and small bowel are normal. Mild diverticulosis of the colon. Findings suggest previous partial right colectomy. Vascular/Lymphatic: Mild calcified plaque over the abdominal aorta which is normal in caliber. No significant adenopathy. Reproductive: Possible small right-sided fibroid over the fundus unchanged. Ovaries unremarkable. Other: Multiple peritoneal masses over the left mid abdomen with the largest measuring 3.1 cm compatible with metastatic disease. Musculoskeletal: Degenerative change of the spine with significant disc disease at the L4-5 level. IMPRESSION: 1.  No acute findings in the abdomen/pelvis. 2. Interval development of multiple ill-defined hypodense liver masses with significant nodular enlargement of the entire left lobe of the liver suggesting metastatic disease in this patient with history of colon cancer. Multiple new peritoneal masses compatible with metastatic disease. 3.  Aortic Atherosclerosis (ICD10-I70.0). 4.  Colonic diverticulosis. Electronically Signed   By: Marin Olp M.D.  On: 08/13/2019 07:55   Dg Chest Portable 1 View  Result Date: 08/13/2019 CLINICAL DATA:  Cough EXAM: PORTABLE CHEST 1 VIEW COMPARISON:  Feb 12, 2018 FINDINGS: There is mild cardiomegaly. Aortic knob calcifications are seen. Both lungs are clear. The visualized skeletal structures are unremarkable. IMPRESSION: No active disease. Electronically Signed   By: Prudencio Pair M.D.   On: 08/13/2019 03:44    Assessment and Plan:   1.  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), right colectomy 04/22/2018  Ascending colon, moderately differentiated mucinous adenocarcinoma, no lymphovascular perineural invasion, no tumor  deposits, tumor budding-low, 0/14 lymph nodes, tumor invades into the subserosa and is focally present at the inked serosal surface (pT4), no loss of mismatch repair protein expression  Colonoscopy 06/13/2017-ascending colon mass-adenocarcinoma, polyps in the ascending and sigmoid-tubular adenomas  CT abdomen/pelvis 08/13/2019-multiple peritoneal masses compatible with metastatic disease, multiple hypodense liver lesions consistent with metastatic disease  2.   Acute pulmonary embolism and left popliteal DVT 02/05/2018 3.   Iron deficiency anemia September 2018 4.  Atrial fibrillation 5.  Congestive heart failure 6.  Hypertension 7.  Morbid obesity 8.  Renal failure  Ms. Pequignot has a history of stage II colon cancer.  Amber Medina presents with failure to thrive.  A CT is consistent with recurrent colon cancer involving the liver and peritoneum.  I reviewed the CT images.  I discussed the probable diagnosis of recurrent colon cancer with Amber Medina.  Amber Medina agrees to a diagnostic biopsy.  We will decide on systemic treatment options if a biopsy confirms metastatic colon cancer.  Amber Medina lives in [place] and will plan to receive oncology follow-up here.  I will follow her in the hospital and arrange for an outpatient appointment at the Cancer center.  Recommendations: 1.  Ultrasound-guided biopsy of a liver lesion, obtain multiple cores for routine histology and molecular testing 2.  Check CEA 3.  Resume full dose anticoagulation therapy after diagnostic procedures and Port-A-Cath placement 4.  Physical therapy evaluation  Please call oncology as needed on 08/14/2019.  I will see her on 08/15/2019.   Betsy Coder, MD 08/13/2019, 11:47 AM

## 2019-08-13 NOTE — ED Notes (Signed)
Patient transported to CT 

## 2019-08-13 NOTE — ED Provider Notes (Signed)
TIME SEEN: 3:03 AM  CHIEF COMPLAINT: Multiple complaints  HPI: Patient is a 68 year old female with history of morbid obesity, hypertension, A. fib on amiodarone, metoprolol and Xarelto, CHF, chronic kidney disease who presents to the emergency department with multiple complaints.  Patient states that she has been getting progressively weak for months.  Was worried she was anemic.  Had similar symptoms with colon cancer and needed blood transfusion.  S/p R colectomy.  Did not require chemotherapy or radiation.  Three weeks ago abdomen became bloated, distended.  Lost appetite.  Started having diarrhea intermittently.  Still passing gas.  After eating, would start burping.  Did have nausea and vomiting 2 weeks ago but has not had any since.  No history of bowel obstruction.  States yesterday she did not eat anything at all.  No fever.  Has mild cough thought due to allergies for 2-3 weeks.  No CP.  Has had SOB "when my heart rate goes up" which happens with any activity and even sitting upright.  HR will go up into 120s-140s.  Symptoms resolve when she lies flat and lies on her left side.  No lower extremity swelling or pain.   ROS: See HPI Constitutional: no fever  Eyes: no drainage  ENT: no runny nose   Cardiovascular:  no chest pain  Resp:  SOB  GI: no vomiting GU: no dysuria Integumentary: no rash  Allergy: no hives  Musculoskeletal: no leg swelling  Neurological: no slurred speech ROS otherwise negative  PAST MEDICAL HISTORY/PAST SURGICAL HISTORY:  Past Medical History:  Diagnosis Date  . Abnormal pulmonary function test 02/2017  . Chronic diastolic (congestive) heart failure ([place]) 10/11/2015  . CKD (chronic kidney disease), stage II   . Colon cancer ([place])   . Hypertension   . Microcytic anemia    by labs 02/2017 -> adm 06/2017 with severe anemia, found to have colon CA.  . Morbid obesity ([employer])   . Nocturnal hypoxemia   . Persistent atrial fibrillation ([place])   . Snoring   .  Suspected sleep apnea     MEDICATIONS:  Prior to Admission medications   Medication Sig Start Date End Date Taking? Authorizing Provider  acetaminophen (TYLENOL) 500 MG tablet Take 1,000 mg by mouth every 8 (eight) hours as needed for mild pain or moderate pain.    [provider]  amiodarone (PACERONE) 200 MG tablet TAKE 1 TABLET BY MOUTH DAILY. Please make overdue appt with Dr. Radford Pax before anymore refills. 2nd attempt 08/10/19   [name], MD  Ascorbic Acid (VITAMIN C PO) Take by mouth.    [provider]  CALCIUM PO Take by mouth.    [provider]  Coenzyme Q10 (CO Q 10 PO) Take by mouth.    [provider]  diltiazem (CARTIA XT) 240 MG 24 hr capsule Take 1 capsule (240 mg total) by mouth daily. Please make overdue annual appt for future refills. [phone]. 2nd attempt. 10/08/18   [name], MD  enoxaparin (LOVENOX) 150 MG/ML injection Inject 1 mL (150 mg total) into the skin every 12 (twelve) hours. 02/16/18   Aline August, MD  enoxaparin (LOVENOX) 150 MG/ML injection Inject 0.95 mLs (145 mg total) into the skin 2 (two) times daily. 02/16/18 02/16/19  Aline August, MD  enoxaparin (LOVENOX) 150 MG/ML injection Inject 1 mL (150 mg total) into the skin every 12 (twelve) hours for 3 days. 02/16/18 02/19/18  Aline August, MD  ferrous gluconate (FERGON) 324 MG tablet Take  1 tablet (324 mg total) by mouth 3 (three) times daily with meals. 06/16/17   Oswald Hillock, MD  furosemide (LASIX) 40 MG tablet Take 1 tablet (40 mg total) by mouth 2 (two) times daily. 02/16/18 03/18/18  Aline August, MD  metoprolol succinate (TOPROL-XL) 25 MG 24 hr tablet TAKE 1 TABLET BY MOUTH EVERY DAY. PLEASE MAKE AN APPOINTMENT WITH DOCTOR TURNER FOR FURHTER REFILLS. 12/13/18   [name], MD  Multiple Vitamins-Minerals (ALIVE WOMENS 50+ PO) Take by mouth.    [provider]  potassium chloride SA (K-DUR) 20 MEQ tablet TAKE 1 TABLET BY MOUTH TWICE DAILY PLEASE  SCHEDULE APPOINTMENT(OVERDUE) 03/01/19   [name], MD  Tetrahydrozoline HCl (VISINE OP) Place 1-2 drops into both eyes daily as needed (allergies/dry eye).    [provider]  TURMERIC PO Take by mouth.    [provider]    ALLERGIES:  Allergies  Allergen Reactions  . Compazine [Prochlorperazine Edisylate] Swelling    SOCIAL HISTORY:  Social History   Tobacco Use  . Smoking status: Former Research scientist (life sciences)  . Smokeless tobacco: Never Used  . Tobacco comment: quit in 2013  Substance Use Topics  . Alcohol use: Yes    Comment: occ    FAMILY HISTORY: Family History  Problem Relation Age of Onset  . Diabetes Mellitus II Mother   . Hypertension Mother   . Congenital heart disease Mother   . Hypertension Father     EXAM: BP 120/60 (BP Location: Left Arm)   Pulse 83   Temp 97.9 F (36.6 C) (Oral)   Resp 20   Ht 5\' 7"  (1.702 m)   Wt (!) 142.4 kg   LMP  (LMP Unknown)   SpO2 98%   BMI 49.18 kg/m  CONSTITUTIONAL: Alert and oriented and responds appropriately to questions.  Obese, in no distress HEAD: Normocephalic EYES: Conjunctivae clear, pupils appear equal, EOMI ENT: normal nose; moist mucous membranes NECK: Supple, no meningismus, no nuchal rigidity, no LAD  CARD: RRR; S1 and S2 appreciated; no murmurs, no clicks, no rubs, no gallops RESP: Normal chest excursion without splinting or tachypnea; breath sounds clear and equal bilaterally; no wheezes, no rhonchi, no rales, no hypoxia or respiratory distress, speaking full sentences ABD/GI: Normal bowel sounds; significantly distended abdomen with tenderness diffusely without guarding or rebound, no fluid wave BACK:  The back appears normal and is non-tender to palpation, there is no CVA tenderness EXT: Normal ROM in all joints; non-tender to palpation; no edema; normal capillary refill; no cyanosis, no calf tenderness or swelling    SKIN: Normal color for age and race; warm; no rash NEURO: Moves all  extremities equally, normal sensation diffusely, normal speech PSYCH: The patient's mood and manner are appropriate. Grooming and personal hygiene are appropriate.  MEDICAL DECISION MAKING: Patient here with multiple complaints.  Complains of shortness of breath and palpitations worse with exertion.  Also having abdominal distention, loss of appetite.  Also complaining of generalized weakness.  Abdomen is quite distended on exam.  I am concerned for recurrent mass versus bowel obstruction.  Will obtain CT of the abdomen pelvis.  Differential also includes ACS, CHF exacerbation, PE.  Felt like this likely to be dissection.,  No focal neurologic deficits suggest stroke.  Differential also includes anemia, dehydration, infection.  Labs, urine, Covid swab pending.  ED PROGRESS: Patient's labs do not show anemia.  Her hemoglobin is 12.  She does have a leukocytosis of 20,000.  Unclear etiology of her  leukocytosis.  Chest x-ray is clear without signs of volume overload or pneumonia.  Urine shows no sign of infection.  Will obtain rectal temp, blood cultures, lactate.  Covid is pending.  Her creatinine is elevated at 2.82.  Her normal creatinine is around 1.3.  I suspect that her acute on chronic renal failure is prerenal in nature due to poor po intake.  Will hydrate.  EKG shows no ischemic abnormality.  Troponin mildly elevated at 28 but this is in the setting of acute on chronic renal failure.  BNP is 85.6.   7:50 AM  CT of the abdomen pelvis for further disposition.  Patient will need admission for her acute on chronic renal failure.  Signed out to Dr. Maryan Rued he will follow up on CT imaging and admit patient.    Amber Medina was evaluated in Emergency Department on 08/13/2019 for the symptoms described in the history of present illness. She was evaluated in the context of the global COVID-19 pandemic, which necessitated consideration that the patient might be at risk for infection with the SARS-CoV-2  virus that causes COVID-19. Institutional protocols and algorithms that pertain to the evaluation of patients at risk for COVID-19 are in a state of rapid change based on information released by regulatory bodies including the CDC and federal and state organizations. These policies and algorithms were followed during the patient's care in the ED.    EKG Interpretation  Date/Time:  Saturday August 13 2019 02:06:58 EST Ventricular Rate:  89 PR Interval:    QRS Duration: 89 QT Interval:  401 QTC Calculation: 488 R Axis:   -40 Text Interpretation: Sinus rhythm Left axis deviation Low voltage, precordial leads Borderline prolonged QT interval No significant change since last tracing Confirmed by Ward, Cyril Mourning [phone]) on 08/13/2019 2:42:47 AM         Ward, Delice Bison, DO 08/13/19 [id number]

## 2019-08-13 NOTE — ED Notes (Signed)
Patient states that abdomen feels "tight and pregnant" and she first noticed this a few weeks ago. When she noticed the distention, she was also experiencing nausea with dry heaves and diarrhea. N/D improved despite continuing tightness until today. Patient had several episodes of diarrhea today.

## 2019-08-13 NOTE — ED Notes (Signed)
Patient assisted with peri care. Barrier cream applied to buttocks and sacral area due to redness and skin irritation as patient was unable to clean liquid stool off at home.

## 2019-08-13 NOTE — ED Triage Notes (Signed)
Per EMS, patient coming from home with complaints of progressively worsening shortness of breath and generalized weakness. Patient states that these problems have been happening for 2-3 weeks. It has gotten so bad that patient is unable to take steps without becoming SOB. Patient also endorses N/V/D and loss of appetite that has been intermittent for the last few weeks but worse today.   Vitals with EMS:  HR 120  HR after fluids and 2 L of O2 was 90 SpO2 99%  Resting RR 20, tachypnea with exertion ( RR 36) BP 123/101 CBG 174

## 2019-08-13 NOTE — ED Notes (Signed)
Asked Pryor Curia, MD, if she wanted a repeat troponin. MD declined repeat troponin.

## 2019-08-13 NOTE — H&P (Signed)
[facility] History and Physical  Amber Medina VXB:[id number] DOB: 1951-03-25 DOA: 08/13/2019  Referring physician: Maryan Rued PCP: [name], MD   Chief Complaint: Abdominal distention  HPI: Amber Medina is a 68 y.o. female with past medical history of colon cancer status post resection in [facility] but did not receive chemotherapy or radiation, she has atrial fibrillation she is on Xarelto.  She came to the hospital complaining multiple symptoms including generalized weakness, vague abdominal pain, abdominal distention, intermittent diarrhea and loss of appetite.  Since yesterday patient was not able to ambulate so she came to the hospital for further evaluation.  In the ED she was found to have the following. Vitals: WNL except sometimes tachypnea. Exam: Abdominal distention with soreness. Labs: Creatinine of 2.82, alk phos is 256 Radiology: CT scan done and showed multiple liver lesions may presents metastatic disease, also multiple peritoneal lesions make prepresent carcinomatosis.  Review of Systems:  12 point review of systems negative except for symptoms mentioned in the HPI.  Past Medical History:  Diagnosis Date  . Abnormal pulmonary function test 02/2017  . Chronic diastolic (congestive) heart failure ([place]) 10/11/2015  . CKD (chronic kidney disease), stage II   . Colon cancer ([place])   . Hypertension   . Microcytic anemia    by labs 02/2017 -> adm 06/2017 with severe anemia, found to have colon CA.  . Morbid obesity ([place])   . Nocturnal hypoxemia   . Persistent atrial fibrillation ([place])   . Snoring   . Suspected sleep apnea    Past Surgical History:  Procedure Laterality Date  . COLONOSCOPY WITH PROPOFOL N/A 06/13/2017   Procedure: COLONOSCOPY WITH PROPOFOL;  Surgeon: Carol Ada, MD;  Location: [facility] ENDOSCOPY;  Service: Endoscopy;  Laterality: N/A;  . ESOPHAGOGASTRODUODENOSCOPY (EGD) WITH PROPOFOL N/A 06/13/2017   Procedure: ESOPHAGOGASTRODUODENOSCOPY (EGD) WITH PROPOFOL;   Surgeon: Carol Ada, MD;  Location: [facility] ENDOSCOPY;  Service: Endoscopy;  Laterality: N/A;  . TONSILLECTOMY     Social History:   reports that she has quit smoking. She has never used smokeless tobacco. She reports current alcohol use. She reports that she does not use drugs.  Allergies  Allergen Reactions  . Compazine [Prochlorperazine Edisylate] Swelling    Family History  Problem Relation Age of Onset  . Diabetes Mellitus II Mother   . Hypertension Mother   . Congenital heart disease Mother   . Hypertension Father     Prior to Admission medications   Medication Sig Start Date End Date Taking? Authorizing Provider  amiodarone (PACERONE) 200 MG tablet TAKE 1 TABLET BY MOUTH DAILY. Please make overdue appt with Dr. Radford Pax before anymore refills. 2nd attempt Patient taking differently: Take 100 mg by mouth daily.  08/10/19  Yes Turner, Eber Hong, MD  diltiazem (CARTIA XT) 240 MG 24 hr capsule Take 1 capsule (240 mg total) by mouth daily. Please make overdue annual appt for future refills. [phone]. 2nd attempt. Patient taking differently: Take 240 mg by mouth daily.  10/08/18  Yes Turner, Eber Hong, MD  metoprolol succinate (TOPROL-XL) 25 MG 24 hr tablet TAKE 1 TABLET BY MOUTH EVERY DAY. PLEASE MAKE AN APPOINTMENT WITH DOCTOR TURNER FOR FURHTER REFILLS. Patient taking differently: Take 25 mg by mouth daily.  12/13/18  Yes Turner, Eber Hong, MD  potassium chloride SA (K-DUR) 20 MEQ tablet TAKE 1 TABLET BY MOUTH TWICE DAILY PLEASE SCHEDULE APPOINTMENT(OVERDUE) Patient taking differently: Take 20 mEq by mouth 2 (two) times daily.  03/01/19  Yes [name], MD  rivaroxaban (XARELTO) 20 MG TABS tablet Take 20 mg by mouth daily with supper.  05/04/19  Yes [provider]  enoxaparin (LOVENOX) 150 MG/ML injection Inject 1 mL (150 mg total) into the skin every 12 (twelve) hours. Patient not taking: Reported on 08/13/2019 02/16/18   Aline August, MD  enoxaparin (LOVENOX) 150 MG/ML  injection Inject 0.95 mLs (145 mg total) into the skin 2 (two) times daily. 02/16/18 02/16/19  Aline August, MD  enoxaparin (LOVENOX) 150 MG/ML injection Inject 1 mL (150 mg total) into the skin every 12 (twelve) hours for 3 days. Patient not taking: Reported on 08/13/2019 02/16/18 02/19/18  Aline August, MD  ferrous gluconate (FERGON) 324 MG tablet Take 1 tablet (324 mg total) by mouth 3 (three) times daily with meals. Patient not taking: Reported on 08/13/2019 06/16/17   Oswald Hillock, MD  furosemide (LASIX) 40 MG tablet Take 1 tablet (40 mg total) by mouth 2 (two) times daily. Patient taking differently: Take 40 mg by mouth daily.  02/16/18 08/13/19  Aline August, MD   Physical Exam: Vitals:   08/13/19 0756 08/13/19 0800  BP:  (!) 117/57  Pulse:  67  Resp:  16  Temp: 97.7 F (36.5 C)   SpO2:  99%   Constitutional: Oriented to person, place, and time. Well-developed and well-nourished. Cooperative.  Head: Normocephalic and atraumatic.  Nose: Nose normal.  Mouth/Throat: Uvula is midline, oropharynx is clear and moist and mucous membranes are normal.  Eyes: Conjunctivae and EOM are normal. Pupils are equal, round, and reactive to light.  Neck: Trachea normal and normal range of motion. Neck supple.  Cardiovascular: Normal rate, regular rhythm, S1 normal, S2 normal, normal heart sounds and intact distal pulses.   Pulmonary/Chest: Effort normal and breath sounds normal.  Abdominal: Soft. Bowel sounds are normal. There is no hepatosplenomegaly. There is no tenderness.  Musculoskeletal: Normal range of motion.  Neurological: Alert and oriented to person, place, and time. Has normal strength. No cranial nerve deficit or sensory deficit.  Skin: Skin is warm, dry and intact.  Psychiatric: Has a normal mood and affect. Speech is normal and behavior is normal.   Labs on Admission:  Basic Metabolic Panel: Recent Labs  Lab 08/13/19 0222  NA 136  K 4.4  CL 105  CO2 17*  GLUCOSE 126*  BUN  32*  CREATININE 2.82*  CALCIUM 9.1   Liver Function Tests: Recent Labs  Lab 08/13/19 0222  AST 45*  ALT 26  ALKPHOS 256*  BILITOT 1.3*  PROT 7.3  ALBUMIN 2.4*   Recent Labs  Lab 08/13/19 0222  LIPASE 19   No results for input(s): AMMONIA in the last 168 hours. CBC: Recent Labs  Lab 08/13/19 0222  WBC 20.5*  NEUTROABS 17.4*  HGB 12.0  HCT 40.6  MCV 89.6  PLT 510*   Cardiac Enzymes: No results for input(s): CKTOTAL, CKMB, CKMBINDEX, TROPONINI in the last 168 hours.  BNP (last 3 results) Recent Labs    08/13/19 0222  BNP 85.6    ProBNP (last 3 results) No results for input(s): PROBNP in the last 8760 hours.  CBG: No results for input(s): GLUCAP in the last 168 hours.  Radiological Exams on Admission: Ct Abdomen Pelvis Wo Contrast  Result Date: 08/13/2019 CLINICAL DATA:  Abdominal distension.  History of colon cancer. EXAM: CT ABDOMEN AND PELVIS WITHOUT CONTRAST TECHNIQUE: Multidetector CT imaging of the abdomen and pelvis was performed following the standard protocol without IV contrast. COMPARISON:  02/09/2018 FINDINGS: Lower  chest: Lung bases are normal. Hepatobiliary: There are multiple new ill-defined hypodense liver masses with significant nodular enlargement of the entire left lobe of the liver. Findings suggest metastatic disease in this patient with known history of colon cancer. Biliary tree is normal. Cholelithiasis is present. Pancreas: Normal. Spleen: Normal. Adrenals/Urinary Tract: Adrenal glands demonstrates small stable bilateral nodules. Kidneys are normal in size without hydronephrosis or nephrolithiasis. Ureters and bladder are normal. Stomach/Bowel: Stomach and small bowel are normal. Mild diverticulosis of the colon. Findings suggest previous partial right colectomy. Vascular/Lymphatic: Mild calcified plaque over the abdominal aorta which is normal in caliber. No significant adenopathy. Reproductive: Possible small right-sided fibroid over the  fundus unchanged. Ovaries unremarkable. Other: Multiple peritoneal masses over the left mid abdomen with the largest measuring 3.1 cm compatible with metastatic disease. Musculoskeletal: Degenerative change of the spine with significant disc disease at the L4-5 level. IMPRESSION: 1.  No acute findings in the abdomen/pelvis. 2. Interval development of multiple ill-defined hypodense liver masses with significant nodular enlargement of the entire left lobe of the liver suggesting metastatic disease in this patient with history of colon cancer. Multiple new peritoneal masses compatible with metastatic disease. 3.  Aortic Atherosclerosis (ICD10-I70.0). 4.  Colonic diverticulosis. Electronically Signed   By: Marin Olp M.D.   On: 08/13/2019 07:55   Dg Chest Portable 1 View  Result Date: 08/13/2019 CLINICAL DATA:  Cough EXAM: PORTABLE CHEST 1 VIEW COMPARISON:  Feb 12, 2018 FINDINGS: There is mild cardiomegaly. Aortic knob calcifications are seen. Both lungs are clear. The visualized skeletal structures are unremarkable. IMPRESSION: No active disease. Electronically Signed   By: Prudencio Pair M.D.   On: 08/13/2019 03:44    EKG: Independently reviewed.   Assessment/Plan Active Problems:   Obesity, morbid (HCC)   Persistent atrial fibrillation (HCC)   Adenocarcinoma of colon (HCC)   Liver lesion   Abdominal distension   AKI on CKD stage III Baseline creatinine is 1.2 from May 2019, presented with creatinine of 2.8. Started on IV fluid hydration with normal saline. Follow renal function closely, follow intake and output.  Liver lesions History of adenocarcinoma of the colon with colon resection but no chemo/radiation Presented with abdominal distention, CT scan showed possible metastasis. Lesions include liver lesions and question of carcinomatosis. Oncology to evaluate.  Chronic atrial fibrillation Rate is controlled with amiodarone, diltiazem and Toprol-XL, continued. Reported she is on  Xarelto, I will hold in case biopsy is needed.  Morbid obesity Counseled extensively about weight loss and obesity relation to OSA.  Chronic diastolic CHF She is clinically dehydrated, hold diuretics and started on IV fluids.  History of left popliteal vein DVT She is on Xarelto for her atrial fibrillation I will hold anticoagulation for now.  Generalized deconditioning Generalized weakness, loss of appetite and energy. Hydrate with IV fluids ambulate as much as can.  Code Status: Full code Family Communication: Plan discussed with the patient Disposition Plan: MedSurg  Time spent: 70 minutes  Birdie Hopes, MD [facility] Pager [phone]

## 2019-08-14 LAB — BASIC METABOLIC PANEL
Anion gap: 13 (ref 5–15)
BUN: 26 mg/dL — ABNORMAL HIGH (ref 8–23)
CO2: 14 mmol/L — ABNORMAL LOW (ref 22–32)
Calcium: 8.3 mg/dL — ABNORMAL LOW (ref 8.9–10.3)
Chloride: 110 mmol/L (ref 98–111)
Creatinine, Ser: 2.08 mg/dL — ABNORMAL HIGH (ref 0.44–1.00)
GFR calc Af Amer: 27 mL/min — ABNORMAL LOW (ref >60)
GFR calc non Af Amer: 24 mL/min — ABNORMAL LOW (ref >60)
Glucose, Bld: 105 mg/dL — ABNORMAL HIGH (ref 70–99)
Potassium: 4.7 mmol/L (ref 3.5–5.1)
Sodium: 137 mmol/L (ref 135–145)

## 2019-08-14 LAB — CBC
HCT: 35.4 % — ABNORMAL LOW (ref 36.0–46.0)
Hemoglobin: 10.8 g/dL — ABNORMAL LOW (ref 12.0–15.0)
MCH: 26.7 pg (ref 26.0–34.0)
MCHC: 30.5 g/dL (ref 30.0–36.0)
MCV: 87.4 fL (ref 80.0–100.0)
RBC: 4.05 MIL/uL (ref 3.87–5.11)
RDW: 16.3 % — ABNORMAL HIGH (ref 11.5–15.5)
WBC: 20 10*3/uL — ABNORMAL HIGH (ref 4.0–10.5)
nRBC: 0 % (ref 0.0–0.2)

## 2019-08-14 MED ORDER — STERILE WATER FOR INJECTION IV SOLN
INTRAVENOUS | Status: DC
  Administered 2019-08-14 – 2019-08-15 (×2): via INTRAVENOUS
  Filled 2019-08-14 (×5): qty 850

## 2019-08-14 MED ORDER — HEPARIN SODIUM (PORCINE) 5000 UNIT/ML IJ SOLN
5000.0000 [IU] | Freq: Three times a day (TID) | INTRAMUSCULAR | Status: DC
  Administered 2019-08-15 – 2019-08-17 (×5): 5000 [IU] via SUBCUTANEOUS
  Filled 2019-08-14 (×5): qty 1

## 2019-08-14 NOTE — Progress Notes (Signed)
[facility] PROGRESS NOTE   Amber Medina TDS:[id number] DOB: 11/17/1950 DOA: 08/13/2019 PCP: [name], MD  Subjective: Denies any complaint this morning, asking for regular diet. Start regular diet, n.p.o. after midnight. Continue to hold Xarelto, anticipate US guided biopsy of liver in a.m.  HPI: Amber Medina is a 68 y.o. female with past medical history of colon cancer status post resection in [facility] but did not receive chemotherapy or radiation, she has atrial fibrillation she is on Xarelto.  She came to the hospital complaining multiple symptoms including generalized weakness, vague abdominal pain, abdominal distention, intermittent diarrhea and loss of appetite.  Since yesterday patient was not able to ambulate so she came to the hospital for further evaluation.  In the ED she was found to have the following. Vitals: WNL except sometimes tachypnea. Exam: Abdominal distention with soreness. Labs: Creatinine of 2.82, alk phos is 256 Radiology: CT scan done and showed multiple liver lesions may presents metastatic disease, also multiple peritoneal lesions make prepresent carcinomatosis.  Assessment/Plan: Active Problems:   Obesity, morbid (HCC)   Persistent atrial fibrillation (HCC)   Adenocarcinoma of colon (HCC)   Liver lesion   Abdominal distension    AKI on CKD stage III Baseline creatinine is 1.2 from May 2019, presented with creatinine of 2.8. Started on IV fluid hydration with normal saline. Follow renal function closely, follow intake and output. This is likely improved since yesterday.  Creatinine today is 2.0, continue IV fluids.  Liver lesions History of adenocarcinoma of the colon with colon resection but no chemo/radiation Presented with abdominal distention, CT scan showed possible metastasis. Lesions include liver lesions and question of carcinomatosis. Patient seen by [name] of oncology, US guided biopsy in a.m.  Leukocytosis WBCs 20.5,  also has thrombocytosis with platelets 510 recheck it in a.m.  Metabolic acidosis Bicarb of 17 yesterday, it is 14 today, I will switch the IV fluids to bicarb drip overnight. Check BMP in a.m. if bicarb back to low normal switched IV fluids to NS infusion. This is likely secondary to AKI  Chronic atrial fibrillation Rate is controlled with amiodarone, diltiazem and Toprol-XL, continued. Reported she is on Xarelto, I will hold in case biopsy is needed.  Morbid obesity Counseled extensively about weight loss and obesity relation to OSA.  Chronic diastolic CHF She is clinically dehydrated, hold diuretics and started on IV fluids.  History of left popliteal vein DVT She is on Xarelto for her atrial fibrillation I will hold anticoagulation for now.  Generalized deconditioning Generalized weakness, loss of appetite and energy. Hydrate with IV fluids ambulate as much as can.   Code Status: Full Code Family Communication: Plan discussed with the patient. Disposition Plan: Remains inpatient Diet:  Diet Order            Diet NPO time specified  Diet effective midnight        Diet regular Room service appropriate? Yes; Fluid consistency: Thin  Diet effective now              Consultants:  [facility]  Procedures:  NA  Antibiotics:  Rocephin   Objective: Vitals:   08/14/19 0543 08/14/19 0544  BP: (!) 92/55 (!) 92/55  Pulse: 68 69  Resp: 17 17  Temp: 98.4 F (36.9 C) 98.4 F (36.9 C)  SpO2: 97% 97%    Intake/Output Summary (Last 24 hours) at 08/14/2019 1208 Last data filed at 08/14/2019 1000 Gross per 24 hour  Intake 1320 ml  Output 325 ml  Net 995 ml   Filed Weights   08/13/19 0156  Weight: (!) 142.4 kg    Exam: General: Alert and awake, oriented x3, not in any acute distress. HEENT: anicteric sclera, pupils reactive to light and accommodation, EOMI CVS: S1-S2 clear, no murmur rubs or gallops Chest: clear to auscultation bilaterally, no wheezing,  rales or rhonchi Abdomen: soft nontender, nondistended, normal bowel sounds, no organomegaly Extremities: no cyanosis, clubbing or edema noted bilaterally Neuro: Cranial nerves II-XII intact, no focal neurological deficits  Data Reviewed: Basic Metabolic Panel: Recent Labs  Lab 08/13/19 0222 08/13/19 1305 08/14/19 0524  NA 136  --  137  K 4.4  --  4.7  CL 105  --  110  CO2 17*  --  14*  GLUCOSE 126*  --  105*  BUN 32*  --  26*  CREATININE 2.82* 2.51* 2.08*  CALCIUM 9.1  --  8.3*   Liver Function Tests: Recent Labs  Lab 08/13/19 0222  AST 45*  ALT 26  ALKPHOS 256*  BILITOT 1.3*  PROT 7.3  ALBUMIN 2.4*   Recent Labs  Lab 08/13/19 0222  LIPASE 19   No results for input(s): AMMONIA in the last 168 hours. CBC: Recent Labs  Lab 08/13/19 0222 08/14/19 0734  WBC 20.5* 20.0*  NEUTROABS 17.4*  --   HGB 12.0 10.8*  HCT 40.6 35.4*  MCV 89.6 87.4  PLT 510* PLATELET CLUMPS NOTED ON SMEAR, COUNT APPEARS INCREASED   Cardiac Enzymes: No results for input(s): CKTOTAL, CKMB, CKMBINDEX, TROPONINI in the last 168 hours. BNP (last 3 results) Recent Labs    08/13/19 0222  BNP 85.6    ProBNP (last 3 results) No results for input(s): PROBNP in the last 8760 hours.  CBG: No results for input(s): GLUCAP in the last 168 hours.  Micro Recent Results (from the past 240 hour(s))  SARS CORONAVIRUS 2 (TAT 6-24 HRS) Nasopharyngeal Nasopharyngeal Swab     Status: None   Collection Time: 08/13/19  3:11 AM   Specimen: Nasopharyngeal Swab  Result Value Ref Range Status   SARS Coronavirus 2 NEGATIVE NEGATIVE Final    Comment: (NOTE) SARS-CoV-2 target nucleic acids are NOT DETECTED. The SARS-CoV-2 RNA is generally detectable in upper and lower respiratory specimens during the acute phase of infection. Negative results do not preclude SARS-CoV-2 infection, do not rule out co-infections with other pathogens, and should not be used as the sole basis for treatment or other patient  management decisions. Negative results must be combined with clinical observations, patient history, and epidemiological information. The expected result is Negative. Fact Sheet for Patients: SugarRoll.be Fact Sheet for Healthcare Providers: https://www.woods-mathews.com/ This test is not yet approved or cleared by the [place] FDA and  has been authorized for detection and/or diagnosis of SARS-CoV-2 by FDA under an Emergency Use Authorization (EUA). This EUA will remain  in effect (meaning this test can be used) for the duration of the COVID-19 declaration under Section 56 4(b)(1) of the Act, 21 U.S.C. section 360bbb-3(b)(1), unless the authorization is terminated or revoked sooner. Performed at [facility], [place] [address], [place], [place] [address]   Blood culture (routine x 2)     Status: None (Preliminary result)   Collection Time: 08/13/19  8:15 AM   Specimen: BLOOD  Result Value Ref Range Status   Specimen Description   Final    BLOOD BLOOD LEFT FOREARM Performed at [facility] [address] [place], [place] [address]  Special Requests   Final    BOTTLES DRAWN AEROBIC AND ANAEROBIC Blood Culture adequate volume Performed at [facility] [address] [place], [place] [address]    Culture   Final    NO GROWTH < 24 HOURS Performed at [facility] [address] [place],  [address]    Report Status PENDING  Incomplete     Studies: Ct Abdomen Pelvis Wo Contrast  Result Date: 08/13/2019 CLINICAL DATA:  Abdominal distension.  History of colon cancer. EXAM: CT ABDOMEN AND PELVIS WITHOUT CONTRAST TECHNIQUE: Multidetector CT imaging of the abdomen and pelvis was performed following the standard protocol without IV contrast. COMPARISON:  02/09/2018 FINDINGS: Lower chest: Lung bases are normal. Hepatobiliary: There are multiple new ill-defined hypodense liver masses  with significant nodular enlargement of the entire left lobe of the liver. Findings suggest metastatic disease in this patient with known history of colon cancer. Biliary tree is normal. Cholelithiasis is present. Pancreas: Normal. Spleen: Normal. Adrenals/Urinary Tract: Adrenal glands demonstrates small stable bilateral nodules. Kidneys are normal in size without hydronephrosis or nephrolithiasis. Ureters and bladder are normal. Stomach/Bowel: Stomach and small bowel are normal. Mild diverticulosis of the colon. Findings suggest previous partial right colectomy. Vascular/Lymphatic: Mild calcified plaque over the abdominal aorta which is normal in caliber. No significant adenopathy. Reproductive: Possible small right-sided fibroid over the fundus unchanged. Ovaries unremarkable. Other: Multiple peritoneal masses over the left mid abdomen with the largest measuring 3.1 cm compatible with metastatic disease. Musculoskeletal: Degenerative change of the spine with significant disc disease at the L4-5 level. IMPRESSION: 1.  No acute findings in the abdomen/pelvis. 2. Interval development of multiple ill-defined hypodense liver masses with significant nodular enlargement of the entire left lobe of the liver suggesting metastatic disease in this patient with history of colon cancer. Multiple new peritoneal masses compatible with metastatic disease. 3.  Aortic Atherosclerosis (ICD10-I70.0). 4.  Colonic diverticulosis. Electronically Signed   By: Marin Olp M.D.   On: 08/13/2019 07:55   Dg Chest Portable 1 View  Result Date: 08/13/2019 CLINICAL DATA:  Cough EXAM: PORTABLE CHEST 1 VIEW COMPARISON:  Feb 12, 2018 FINDINGS: There is mild cardiomegaly. Aortic knob calcifications are seen. Both lungs are clear. The visualized skeletal structures are unremarkable. IMPRESSION: No active disease. Electronically Signed   By: Prudencio Pair M.D.   On: 08/13/2019 03:44    Scheduled Meds: . amiodarone  100 mg Oral Daily  .  diltiazem  240 mg Oral Daily  . heparin  5,000 Units Subcutaneous Q8H  . metoprolol succinate  25 mg Oral Daily   Continuous Infusions: . sodium chloride 125 mL/hr at 08/13/19 0420  . sodium chloride 100 mL/hr at 08/14/19 0051  . cefTRIAXone (ROCEPHIN)  IV 2 g (08/13/19 1419)       Time spent: 35 minutes    [facility] Pager [phone] If 7PM-7AM, please contact night-coverage at [email or web address], password [facility] 08/14/2019, 12:08 PM  LOS: 1 day

## 2019-08-14 NOTE — Evaluation (Signed)
Physical Therapy Evaluation Patient Details Name: Amber Medina MRN: [id number] DOB: 11-01-1950 Today's Date: 08/14/2019   History of Present Illness  68 yo female admitted with Afib. Hx of PE, A fib, morbid obesity, anemia, CHF, CKD  Clinical Impression  On eval, pt required Min assist for mobility. She walked ~15 feet around the room using a RW. Dyspnea 2/4, HR mid 80s with activity on today. Discussed d/c plan-pt plans to return home where she lives alone. She politely declines HHPT f/u. Will follow during hospital stay.     Follow Up Recommendations Supervision - Intermittent(pt politely declines HHPT f/u)    Equipment Recommendations  None recommended by PT    Recommendations for Other Services       Precautions / Restrictions Precautions Precautions: Fall Restrictions Weight Bearing Restrictions: No      Mobility  Bed Mobility Overal bed mobility: Modified Independent             General bed mobility comments: Increased time/effort. Pt relied on bedrail.  Transfers Overall transfer level: Needs assistance Equipment used: Rolling walker (2 wheeled) Transfers: Sit to/from Stand Sit to Stand: From elevated surface;Min assist         General transfer comment: Assist to rise, stabilize, control descent. VCs safety, hand placement.  Ambulation/Gait Ambulation/Gait assistance: Min assist Gait Distance (Feet): 15 Feet Assistive device: Rolling walker (2 wheeled) Gait Pattern/deviations: Step-through pattern;Decreased stride length     General Gait Details: Assist to stabilize intermittently. Dyspnea 2/4, HR mid 80s with short distance ambulation. Pt c/o knee pain limiting distance on today.  Stairs            Wheelchair Mobility    Modified Rankin (Stroke Patients Only)       Balance Overall balance assessment: Needs assistance         Standing balance support: Bilateral upper extremity supported Standing balance-Leahy Scale: Poor                                Pertinent Vitals/Pain Pain Assessment: 0-10 Pain Score: 5  Pain Location: knees, R >L Pain Descriptors / Indicators: Sore;Aching Pain Intervention(s): Limited activity within patient's tolerance;Monitored during session;Repositioned    Home Living Family/patient expects to be discharged to:: Private residence Living Arrangements: Alone   Type of Home: House Home Access: Stairs to enter Entrance Stairs-Rails: Right Entrance Stairs-Number of Steps: 4 Home Layout: One level Home Equipment: Environmental consultant - 4 wheels;Cane - single point      Prior Function Level of Independence: Independent with assistive device(s)         Comments: using walker most recently     Hand Dominance        Extremity/Trunk Assessment   Upper Extremity Assessment Upper Extremity Assessment: Overall WFL for tasks assessed    Lower Extremity Assessment Lower Extremity Assessment: Generalized weakness    Cervical / Trunk Assessment Cervical / Trunk Assessment: Normal  Communication   Communication: No difficulties  Cognition Arousal/Alertness: Awake/alert Behavior During Therapy: WFL for tasks assessed/performed Overall Cognitive Status: Within Functional Limits for tasks assessed                                        General Comments      Exercises     Assessment/Plan    PT Assessment Patient needs continued PT services  PT  Problem List Decreased mobility;Decreased activity tolerance;Decreased balance;Pain;Decreased strength       PT Treatment Interventions DME instruction;Gait training;Therapeutic exercise;Therapeutic activities;Patient/family education;Balance training;Functional mobility training    PT Goals (Current goals can be found in the Care Plan section)  Acute Rehab PT Goals Patient Stated Goal: home soon. regain strength. PT Goal Formulation: With patient Time For Goal Achievement: 08/28/19 Potential to Achieve Goals:  Good    Frequency Min 3X/week   Barriers to discharge        Co-evaluation               AM-PAC PT "6 Clicks" Mobility  Outcome Measure Help needed turning from your back to your side while in a flat bed without using bedrails?: A Little Help needed moving from lying on your back to sitting on the side of a flat bed without using bedrails?: A Little Help needed moving to and from a bed to a chair (including a wheelchair)?: A Little Help needed standing up from a chair using your arms (e.g., wheelchair or bedside chair)?: A Little Help needed to walk in hospital room?: A Little Help needed climbing 3-5 steps with a railing? : A Little 6 Click Score: 18    End of Session Equipment Utilized During Treatment: Gait belt Activity Tolerance: Patient limited by fatigue;Patient limited by pain Patient left: in chair;with call bell/phone within reach   PT Visit Diagnosis: Unsteadiness on feet (R26.81);Muscle weakness (generalized) (M62.81);Pain Pain - part of body: Knee    Time: [id number] PT Time Calculation (min) (ACUTE ONLY): 16 min   Charges:   PT Evaluation $PT Eval Moderate Complexity: [place], PT Acute Rehabilitation Services Pager: [phone] Office: [phone]

## 2019-08-14 NOTE — H&P (Signed)
Chief Complaint: Liver mass  Referring Physician(s): Sherrill  Supervising Physician: Arne Cleveland  Patient Status: [facility] - In-pt  History of Present Illness: Amber Medina is a 68 y.o. female with history of morbid obesity, hypertension, A. fib on amiodarone, metoprolol and Xarelto, CHF, chronic kidney disease.  She presented to the ED yesterday complaining of becoming progressively more weak for months.   She states she felt this way when she was first diagnosed with colon cancer and she was worried she was anemic.    She has undergone colectomy. She did not have chemotherapy or radiation..  She also c/o her abdomen being bloated and distended.   CT showed = Interval development of multiple ill-defined hypodense liver masses with significant nodular enlargement of the entire left lobe of the liver suggesting metastatic disease in this patient with history of colon cancer. Multiple new peritoneal masses compatible with metastatic disease.  We are asked to perform an image guided biopsy of a liver mass.  Her Xarelto has been on hold and she is on SQ heparin.  Past Medical History:  Diagnosis Date   Abnormal pulmonary function test 02/2017   Chronic diastolic (congestive) heart failure ([place]) 10/11/2015   CKD (chronic kidney disease), stage II    Colon cancer (HCC)    Hypertension    Microcytic anemia    by labs 02/2017 -> adm 06/2017 with severe anemia, found to have colon CA.   Morbid obesity ([place])    Nocturnal hypoxemia    Persistent atrial fibrillation (HCC)    Snoring    Suspected sleep apnea     Past Surgical History:  Procedure Laterality Date   COLONOSCOPY WITH PROPOFOL N/A 06/13/2017   Procedure: COLONOSCOPY WITH PROPOFOL;  Surgeon: Carol Ada, MD;  Location: [facility] ENDOSCOPY;  Service: Endoscopy;  Laterality: N/A;   ESOPHAGOGASTRODUODENOSCOPY (EGD) WITH PROPOFOL N/A 06/13/2017   Procedure: ESOPHAGOGASTRODUODENOSCOPY (EGD) WITH PROPOFOL;  Surgeon:  Carol Ada, MD;  Location: [facility] ENDOS[facility];  Service: Endoscopy;  Laterality: N/A;   TONSILLECTOMY      Allergies: Compazine [prochlorperazine edisylate]  Medications: Prior to Admission medications   Medication Sig Start Date End Date Taking? Authorizing Provider  amiodarone (PACERONE) 200 MG tablet TAKE 1 TABLET BY MOUTH DAILY. Please make overdue appt with Dr. Radford Pax before anymore refills. 2nd attempt Patient taking differently: Take 100 mg by mouth daily.  08/10/19  Yes Turner, Eber Hong, MD  diltiazem (CARTIA XT) 240 MG 24 hr capsule Take 1 capsule (240 mg total) by mouth daily. Please make overdue annual appt for future refills. [phone]. 2nd attempt. Patient taking differently: Take 240 mg by mouth daily.  10/08/18  Yes Turner, Eber Hong, MD  metoprolol succinate (TOPROL-XL) 25 MG 24 hr tablet TAKE 1 TABLET BY MOUTH EVERY DAY. PLEASE MAKE AN APPOINTMENT WITH DOCTOR TURNER FOR FURHTER REFILLS. Patient taking differently: Take 25 mg by mouth daily.  12/13/18  Yes Turner, Eber Hong, MD  potassium chloride SA (K-DUR) 20 MEQ tablet TAKE 1 TABLET BY MOUTH TWICE DAILY PLEASE SCHEDULE APPOINTMENT(OVERDUE) Patient taking differently: Take 20 mEq by mouth 2 (two) times daily.  03/01/19  Yes Turner, Eber Hong, MD  rivaroxaban (XARELTO) 20 MG TABS tablet Take 20 mg by mouth daily with supper.  05/04/19  Yes [provider]  enoxaparin (LOVENOX) 150 MG/ML injection Inject 1 mL (150 mg total) into the skin every 12 (twelve) hours. Patient not taking: Reported on 08/13/2019 02/16/18   Aline August, MD  enoxaparin (LOVENOX) 150 MG/ML injection  Inject 0.95 mLs (145 mg total) into the skin 2 (two) times daily. 02/16/18 02/16/19  Aline August, MD  enoxaparin (LOVENOX) 150 MG/ML injection Inject 1 mL (150 mg total) into the skin every 12 (twelve) hours for 3 days. Patient not taking: Reported on 08/13/2019 02/16/18 02/19/18  Aline August, MD  ferrous gluconate (FERGON) 324 MG tablet Take 1 tablet (324 mg  total) by mouth 3 (three) times daily with meals. Patient not taking: Reported on 08/13/2019 06/16/17   Oswald Hillock, MD  furosemide (LASIX) 40 MG tablet Take 1 tablet (40 mg total) by mouth 2 (two) times daily. Patient taking differently: Take 40 mg by mouth daily.  02/16/18 08/13/19  Aline August, MD     Family History  Problem Relation Age of Onset   Diabetes Mellitus II Mother    Hypertension Mother    Congenital heart disease Mother    Hypertension Father     Social History   Socioeconomic History   Marital status: Single    Spouse name: Not on file   Number of children: Not on file   Years of education: Not on file   Highest education level: Not on file  Occupational History   Not on file  Social Needs   Financial resource strain: Not on file   Food insecurity    Worry: Not on file    Inability: Not on file   Transportation needs    Medical: Not on file    Non-medical: Not on file  Tobacco Use   Smoking status: Former Smoker   Smokeless tobacco: Never Used   Tobacco comment: quit in 2013  Substance and Sexual Activity   Alcohol use: Yes    Comment: occ   Drug use: No   Sexual activity: Not on file  Lifestyle   Physical activity    Days per week: Not on file    Minutes per session: Not on file   Stress: Not on file  Relationships   Social connections    Talks on phone: Not on file    Gets together: Not on file    Attends religious service: Not on file    Active member of club or organization: Not on file    Attends meetings of clubs or organizations: Not on file    Relationship status: Not on file  Other Topics Concern   Not on file  Social History Narrative   Not on file     Review of Systems: A 12 point ROS discussed and pertinent positives are indicated in the HPI above.  All other systems are negative.  Review of Systems  Vital Signs: BP (!) 92/55 (BP Location: Left Arm)    Pulse 69    Temp 98.4 F (36.9 C) (Oral)     Resp 17    Ht 5\' 7"  (1.702 m)    Wt (!) 142.4 kg    LMP  (LMP Unknown)    SpO2 97%    BMI 49.18 kg/m   Physical Exam Vitals signs reviewed.  Constitutional:      Appearance: She is obese.  HENT:     Head: Normocephalic and atraumatic.  Eyes:     Extraocular Movements: Extraocular movements intact.  Cardiovascular:     Rate and Rhythm: Normal rate and regular rhythm.  Pulmonary:     Effort: Pulmonary effort is normal.     Breath sounds: Normal breath sounds.  Abdominal:     Palpations: Abdomen is soft.  Tenderness: There is no abdominal tenderness.  Musculoskeletal: Normal range of motion.  Skin:    General: Skin is warm and dry.  Neurological:     General: No focal deficit present.     Mental Status: She is alert and oriented to person, place, and time.  Psychiatric:        Mood and Affect: Mood normal.        Behavior: Behavior normal.        Thought Content: Thought content normal.        Judgment: Judgment normal.     Imaging: Ct Abdomen Pelvis Wo Contrast  Result Date: 08/13/2019 CLINICAL DATA:  Abdominal distension.  History of colon cancer. EXAM: CT ABDOMEN AND PELVIS WITHOUT CONTRAST TECHNIQUE: Multidetector CT imaging of the abdomen and pelvis was performed following the standard protocol without IV contrast. COMPARISON:  02/09/2018 FINDINGS: Lower chest: Lung bases are normal. Hepatobiliary: There are multiple new ill-defined hypodense liver masses with significant nodular enlargement of the entire left lobe of the liver. Findings suggest metastatic disease in this patient with known history of colon cancer. Biliary tree is normal. Cholelithiasis is present. Pancreas: Normal. Spleen: Normal. Adrenals/Urinary Tract: Adrenal glands demonstrates small stable bilateral nodules. Kidneys are normal in size without hydronephrosis or nephrolithiasis. Ureters and bladder are normal. Stomach/Bowel: Stomach and small bowel are normal. Mild diverticulosis of the colon. Findings  suggest previous partial right colectomy. Vascular/Lymphatic: Mild calcified plaque over the abdominal aorta which is normal in caliber. No significant adenopathy. Reproductive: Possible small right-sided fibroid over the fundus unchanged. Ovaries unremarkable. Other: Multiple peritoneal masses over the left mid abdomen with the largest measuring 3.1 cm compatible with metastatic disease. Musculoskeletal: Degenerative change of the spine with significant disc disease at the L4-5 level. IMPRESSION: 1.  No acute findings in the abdomen/pelvis. 2. Interval development of multiple ill-defined hypodense liver masses with significant nodular enlargement of the entire left lobe of the liver suggesting metastatic disease in this patient with history of colon cancer. Multiple new peritoneal masses compatible with metastatic disease. 3.  Aortic Atherosclerosis (ICD10-I70.0). 4.  Colonic diverticulosis. Electronically Signed   By: Marin Olp M.D.   On: 08/13/2019 07:55   Dg Chest Portable 1 View  Result Date: 08/13/2019 CLINICAL DATA:  Cough EXAM: PORTABLE CHEST 1 VIEW COMPARISON:  Feb 12, 2018 FINDINGS: There is mild cardiomegaly. Aortic knob calcifications are seen. Both lungs are clear. The visualized skeletal structures are unremarkable. IMPRESSION: No active disease. Electronically Signed   By: Prudencio Pair M.D.   On: 08/13/2019 03:44    Labs:  CBC: Recent Labs    08/13/19 0222 08/14/19 0734  WBC 20.5* 20.0*  HGB 12.0 10.8*  HCT 40.6 35.4*  PLT 510* PLATELET CLUMPS NOTED ON SMEAR, COUNT APPEARS INCREASED    COAGS: Recent Labs    08/13/19 1305  INR 1.6*    BMP: Recent Labs    08/13/19 0222 08/13/19 1305 08/14/19 0524  NA 136  --  137  K 4.4  --  4.7  CL 105  --  110  CO2 17*  --  14*  GLUCOSE 126*  --  105*  BUN 32*  --  26*  CALCIUM 9.1  --  8.3*  CREATININE 2.82* 2.51* 2.08*  GFRNONAA 16* 19* 24*  GFRAA 19* 22* 27*    LIVER FUNCTION TESTS: Recent Labs    08/13/19 0222    BILITOT 1.3*  AST 45*  ALT 26  ALKPHOS 256*  PROT 7.3  ALBUMIN  2.4*    TUMOR MARKERS: No results for input(s): AFPTM, CEA, CA199, CHROMGRNA in the last 8760 hours.  Assessment and Plan:  Multiple liver masses.  Will proceed with image guided biopsy on Monday.  Risks and benefits of liver mass biopsy was discussed with the patient and/or patient's family including, but not limited to bleeding, infection, damage to adjacent structures or low yield requiring additional tests.  All of the questions were answered and there is agreement to proceed.  Consent signed and in chart.  Thank you for this interesting consult.  I greatly enjoyed meeting Kambra Kromer and look forward to participating in their care.  A copy of this report was sent to the requesting provider on this date.  Electronically Signed: Murrell Redden, PA-C   08/14/2019, 1:43 PM      I spent a total of 40 Minutes in face to face in clinical consultation, greater than 50% of which was counseling/coordinating care for liver mass biopsy.

## 2019-08-15 ENCOUNTER — Inpatient Hospital Stay (HOSPITAL_COMMUNITY): Payer: Medicare HMO

## 2019-08-15 ENCOUNTER — Encounter (HOSPITAL_COMMUNITY): Payer: Self-pay | Admitting: Radiology

## 2019-08-15 LAB — MAGNESIUM: Magnesium: 1.9 mg/dL (ref 1.7–2.4)

## 2019-08-15 LAB — CBC
HCT: 34 % — ABNORMAL LOW (ref 36.0–46.0)
Hemoglobin: 10.1 g/dL — ABNORMAL LOW (ref 12.0–15.0)
MCH: 26.4 pg (ref 26.0–34.0)
MCHC: 29.7 g/dL — ABNORMAL LOW (ref 30.0–36.0)
MCV: 89 fL (ref 80.0–100.0)
Platelets: 362 10*3/uL (ref 150–400)
RBC: 3.82 MIL/uL — ABNORMAL LOW (ref 3.87–5.11)
RDW: 16.3 % — ABNORMAL HIGH (ref 11.5–15.5)
WBC: 19.4 10*3/uL — ABNORMAL HIGH (ref 4.0–10.5)
nRBC: 0 % (ref 0.0–0.2)

## 2019-08-15 LAB — BASIC METABOLIC PANEL
Anion gap: 12 (ref 5–15)
BUN: 25 mg/dL — ABNORMAL HIGH (ref 8–23)
CO2: 19 mmol/L — ABNORMAL LOW (ref 22–32)
Calcium: 8.1 mg/dL — ABNORMAL LOW (ref 8.9–10.3)
Chloride: 108 mmol/L (ref 98–111)
Creatinine, Ser: 1.8 mg/dL — ABNORMAL HIGH (ref 0.44–1.00)
GFR calc Af Amer: 32 mL/min — ABNORMAL LOW (ref >60)
GFR calc non Af Amer: 28 mL/min — ABNORMAL LOW (ref >60)
Glucose, Bld: 124 mg/dL — ABNORMAL HIGH (ref 70–99)
Potassium: 3.7 mmol/L (ref 3.5–5.1)
Sodium: 139 mmol/L (ref 135–145)

## 2019-08-15 LAB — PROTIME-INR
INR: 1.6 — ABNORMAL HIGH (ref 0.8–1.2)
Prothrombin Time: 19.1 seconds — ABNORMAL HIGH (ref 11.4–15.2)

## 2019-08-15 MED ORDER — MIDAZOLAM HCL 2 MG/2ML IJ SOLN
INTRAMUSCULAR | Status: AC
  Filled 2019-08-15: qty 4

## 2019-08-15 MED ORDER — FENTANYL CITRATE (PF) 100 MCG/2ML IJ SOLN
INTRAMUSCULAR | Status: AC
  Filled 2019-08-15: qty 2

## 2019-08-15 MED ORDER — LIDOCAINE HCL (PF) 1 % IJ SOLN
INTRAMUSCULAR | Status: AC | PRN
  Administered 2019-08-15: 10 mL

## 2019-08-15 MED ORDER — MIDAZOLAM HCL 2 MG/2ML IJ SOLN
INTRAMUSCULAR | Status: AC | PRN
  Administered 2019-08-15 (×2): 1 mg via INTRAVENOUS

## 2019-08-15 MED ORDER — FENTANYL CITRATE (PF) 100 MCG/2ML IJ SOLN
INTRAMUSCULAR | Status: AC | PRN
  Administered 2019-08-15 (×2): 50 ug via INTRAVENOUS

## 2019-08-15 MED ORDER — SODIUM CHLORIDE 0.9 % IV SOLN
INTRAVENOUS | Status: DC
  Administered 2019-08-15: 21:00:00 via INTRAVENOUS

## 2019-08-15 NOTE — Progress Notes (Signed)
PROGRESS NOTE  Amber Medina [id number] DOB: [birth year] DOA: 08/13/2019 PCP: [name], MD   LOS: 2 days   Brief Narrative / Interim history: 68 year old female with history of colon cancer status post resection in [place] but did not receive chemotherapy or radiation, A. fib on Xarelto, was admitted to the hospital on 08/13/2019 with generalized weakness, abdominal pain and distention and appetite loss.  CT scan on admission showed multiple liver lesions suspect for metastatic disease as well as peritoneal lesions suspect for carcinomatosis.  Oncology and IR were consulted for potential biopsy  Subjective / 24h Interval events: She is doing better this morning, feels improved, feels stronger.  Assessment & Plan: Active Problems:   Obesity, morbid ([place])   Persistent atrial fibrillation (HCC)   Adenocarcinoma of colon (HCC)   Liver lesion   Abdominal distension   Principal Problem Acute kidney injury on chronic kidney disease stage IIIa -Baseline creatinine was about 1.2 in May 2019, creatinine on admission at 2.8.  She was placed on IV fluids which are to be continued today, creatinine improving at 1.8  Active Problems Liver lesions with concern for metastatic disease -She does have a history of colon adenocarcinoma status post resection but no systemic therapy -Oncology consulted.  She is to undergo an ultrasound-guided biopsy by IR  Leukocytosis -Possibly related to cancer/metastatic disease  Chronic A. fib -Rate control with amiodarone, diltiazem, metoprolol.  Xarelto is on hold due to pending biopsy and will resume postprocedure  Morbid obesity -BMI 49, patient will benefit from weight loss  History of left popliteal vein DVT -On Xarelto  Chronic diastolic CHF -Dehydrated, continue fluids with end date tomorrow    Scheduled Meds: . amiodarone  100 mg Oral Daily  . diltiazem  240 mg Oral Daily  . heparin  5,000 Units Subcutaneous Q8H  . metoprolol succinate   25 mg Oral Daily   Continuous Infusions: . sodium chloride    . cefTRIAXone (ROCEPHIN)  IV 2 g (08/15/19 1332)  .  sodium bicarbonate (isotonic) infusion in sterile water 100 mL/hr at 08/15/19 0123   PRN Meds:.acetaminophen **OR** acetaminophen, HYDROcodone-acetaminophen, iohexol  DVT prophylaxis: heparin Code Status: Full code Family Communication: d/w patient  Disposition Plan: home when ready   Consultants:  Oncology  IR  Procedures: None   Microbiology  None   Antimicrobials: None     Objective: Vitals:   08/14/19 0543 08/14/19 0544 08/14/19 1420 08/15/19 0624  BP: (!) 92/55 (!) 92/55 103/61 105/72  Pulse: 68 69 77 70  Resp: 17 17 18 17   Temp: 98.4 F (36.9 C) 98.4 F (36.9 C) 98.2 F (36.8 C) 98.2 F (36.8 C)  TempSrc: Oral Oral Oral Oral  SpO2: 97% 97% 99% 96%  Weight:      Height:       No intake or output data in the 24 hours ending 08/15/19 1410 Filed Weights   08/13/19 0156  Weight: (!) 142.4 kg    Examination:  Constitutional: NAD Eyes: PERRL, lids and conjunctivae normal ENMT: Mucous membranes are moist.  Respiratory: clear to auscultation bilaterally, no wheezing, no crackles. Normal respiratory effort. Cardiovascular: Regular rate and rhythm, no murmurs / rubs / gallops. No LE edema.  Abdomen: no tenderness. Bowel sounds positive.  Musculoskeletal: no clubbing / cyanosis.  Skin: no rashes Neurologic: CN 2-12 grossly intact. Strength 5/5 in all 4.  Psychiatric: Normal judgment and insight. Alert and oriented x 3. Normal mood.    Data Reviewed: I have independently reviewed following  labs and imaging studies   CBC: Recent Labs  Lab 08/13/19 0222 08/14/19 0734 08/15/19 0529  WBC 20.5* 20.0* 19.4*  NEUTROABS 17.4*  --   --   HGB 12.0 10.8* 10.1*  HCT 40.6 35.4* 34.0*  MCV 89.6 87.4 89.0  PLT 510* PLATELET CLUMPS NOTED ON SMEAR, COUNT APPEARS INCREASED [id number]   Basic Metabolic Panel: Recent Labs  Lab 08/13/19 0222 08/13/19 1305  08/14/19 0524 08/15/19 0529  NA 136  --  137 139  K 4.4  --  4.7 3.7  CL 105  --  110 108  CO2 17*  --  14* 19*  GLUCOSE 126*  --  105* 124*  BUN 32*  --  26* 25*  CREATININE 2.82* 2.51* 2.08* 1.80*  CALCIUM 9.1  --  8.3* 8.1*  MG  --   --   --  1.9   GFR: Estimated Creatinine Clearance: 43.1 mL/min (A) (by C-G formula based on SCr of 1.8 mg/dL (H)). Liver Function Tests: Recent Labs  Lab 08/13/19 0222  AST 45*  ALT 26  ALKPHOS 256*  BILITOT 1.3*  PROT 7.3  ALBUMIN 2.4*   Recent Labs  Lab 08/13/19 0222  LIPASE 19   No results for input(s): AMMONIA in the last 168 hours. Coagulation Profile: Recent Labs  Lab 08/13/19 1305 08/15/19 0529  INR 1.6* 1.6*   Cardiac Enzymes: No results for input(s): CKTOTAL, CKMB, CKMBINDEX, TROPONINI in the last 168 hours. BNP (last 3 results) No results for input(s): PROBNP in the last 8760 hours. HbA1C: Recent Labs    08/13/19 1305  HGBA1C 6.9*   CBG: No results for input(s): GLUCAP in the last 168 hours. Lipid Profile: No results for input(s): CHOL, HDL, LDLCALC, TRIG, CHOLHDL, LDLDIRECT in the last 72 hours. Thyroid Function Tests: Recent Labs    08/13/19 1800  TSH 4.119   Anemia Panel: No results for input(s): VITAMINB12, FOLATE, FERRITIN, TIBC, IRON, RETICCTPCT in the last 72 hours. Urine analysis:    Component Value Date/Time   COLORURINE AMBER (A) 08/13/2019 0222   APPEARANCEUR CLOUDY (A) 08/13/2019 0222   LABSPEC 1.012 08/13/2019 0222   PHURINE 5.0 08/13/2019 0222   GLUCOSEU 50 (A) 08/13/2019 0222   HGBUR SMALL (A) 08/13/2019 0222   BILIRUBINUR NEGATIVE 08/13/2019 0222   KETONESUR NEGATIVE 08/13/2019 0222   PROTEINUR 100 (A) 08/13/2019 0222   NITRITE NEGATIVE 08/13/2019 0222   LEUKOCYTESUR NEGATIVE 08/13/2019 0222   Sepsis Labs: Invalid input(s): PROCALCITONIN, LACTICIDVEN  Recent Results (from the past 240 hour(s))  SARS CORONAVIRUS 2 (TAT 6-24 HRS) Nasopharyngeal Nasopharyngeal Swab     Status: None    Collection Time: 08/13/19  3:11 AM   Specimen: Nasopharyngeal Swab  Result Value Ref Range Status   SARS Coronavirus 2 NEGATIVE NEGATIVE Final    Comment: (NOTE) SARS-CoV-2 target nucleic acids are NOT DETECTED. The SARS-CoV-2 RNA is generally detectable in upper and lower respiratory specimens during the acute phase of infection. Negative results do not preclude SARS-CoV-2 infection, do not rule out co-infections with other pathogens, and should not be used as the sole basis for treatment or other patient management decisions. Negative results must be combined with clinical observations, patient history, and epidemiological information. The expected result is Negative. Fact Sheet for Patients: SugarRoll.be Fact Sheet for Healthcare Providers: https://www.woods-mathews.com/ This test is not yet approved or cleared by the [place] FDA and  has been authorized for detection and/or diagnosis of SARS-CoV-2 by FDA under an Emergency Use Authorization (EUA). This EUA  will remain  in effect (meaning this test can be used) for the duration of the COVID-19 declaration under Section 56 4(b)(1) of the Act, 21 U.S.C. section 360bbb-3(b)(1), unless the authorization is terminated or revoked sooner. Performed at [facility], [place] [address], [place], [place] [address]   Blood culture (routine x 2)     Status: None (Preliminary result)   Collection Time: 08/13/19  8:15 AM   Specimen: BLOOD  Result Value Ref Range Status   Specimen Description   Final    BLOOD BLOOD LEFT FOREARM Performed at [facility] [address] [place], [place] [address]    Special Requests   Final    BOTTLES DRAWN AEROBIC AND ANAEROBIC Blood Culture adequate volume Performed at [facility] [address], [place], [place] [address]    Culture   Final    NO GROWTH 2 DAYS Performed at [facility] [address], [place], [place] [address]    Report Status PENDING  Incomplete      Radiology Studies: No results found.   Marzetta Board, MD, PhD [facility]  Contact via  [email or web address]  [place] P: [phone] F: [phone]

## 2019-08-15 NOTE — Progress Notes (Signed)
PT Cancellation Note  Patient Details Name: Amber Medina MRN: [id number] DOB: 1950/12/05   Cancelled Treatment:    Reason Eval/Treat Not Completed: Other (comment) Pt just returning to room from biopsy.  Will f/u at later time.   Mikael Spray Aalijah Mims 08/15/2019, 5:52 PM

## 2019-08-15 NOTE — Progress Notes (Signed)
Nutrition Brief Note  Patient identified on the Malnutrition Screening Tool (MST) Report  Patient with no weight loss per weight records. Pt consumed 100% of meals prior to diet change, currently NPO today for biopsy.   Wt Readings from Last 15 Encounters:  08/13/19 (!) 142.4 kg  02/16/18 (!) 143.1 kg  06/25/17 (!) 147.4 kg  06/11/17 (!) 147.5 kg  06/11/17 (!) 146.1 kg  03/10/17 (!) 145.5 kg  02/17/17 (!) 145.5 kg  02/05/16 (!) 148.1 kg  10/11/15 (!) 144.9 kg  09/15/15 (!) 147.2 kg    Body mass index is 49.18 kg/m. Patient meets criteria for morbid obesity based on current BMI.   Current diet order is NPO for biopsy. On 11/8, pt was on a regular diet. Patient is consuming approximately 100% of meals at this time. Labs and medications reviewed.   No nutrition interventions warranted at this time. If nutrition issues arise, please consult RD.   Clayton Bibles, MS, RD, LDN Inpatient Clinical Dietitian Pager: [phone] After Hours Pager: [phone]

## 2019-08-15 NOTE — Progress Notes (Signed)
Biopsy dressing on abdomen clean and dry, patient denies any pain.Talking on the phone.

## 2019-08-15 NOTE — Procedures (Signed)
Interventional Radiology Procedure Note  Procedure: US Guided Biopsy of abdominal mass  Complications: None  Estimated Blood Loss: < 10 mL  Findings: 18 G core biopsy of left abdominal mass performed under CT guidance.  Three core samples obtained and sent to Pathology.  Venetia Night. Kathlene Cote, M.D Pager:  [phone]

## 2019-08-15 NOTE — Progress Notes (Signed)
Patient ID: Amber Medina, female   DOB: 1951/02/24, 68 y.o.   MRN: [id number] Latest imaging studies/labs were reviewed by Dr. Kathlene Cote; he feels safer area to bx on this pt would be peritoneal mass. Above d/w [name] and pt with their understanding and consent.

## 2019-08-16 ENCOUNTER — Encounter (HOSPITAL_COMMUNITY): Payer: Self-pay | Admitting: Interventional Radiology

## 2019-08-16 ENCOUNTER — Inpatient Hospital Stay (HOSPITAL_COMMUNITY): Payer: Medicare HMO

## 2019-08-16 DIAGNOSIS — E669 Obesity, unspecified: Secondary | ICD-10-CM

## 2019-08-16 DIAGNOSIS — R531 Weakness: Secondary | ICD-10-CM

## 2019-08-16 DIAGNOSIS — C786 Secondary malignant neoplasm of retroperitoneum and peritoneum: Principal | ICD-10-CM

## 2019-08-16 DIAGNOSIS — D509 Iron deficiency anemia, unspecified: Secondary | ICD-10-CM

## 2019-08-16 DIAGNOSIS — I10 Essential (primary) hypertension: Secondary | ICD-10-CM

## 2019-08-16 DIAGNOSIS — I4891 Unspecified atrial fibrillation: Secondary | ICD-10-CM

## 2019-08-16 DIAGNOSIS — I509 Heart failure, unspecified: Secondary | ICD-10-CM

## 2019-08-16 DIAGNOSIS — C182 Malignant neoplasm of ascending colon: Secondary | ICD-10-CM

## 2019-08-16 DIAGNOSIS — E860 Dehydration: Secondary | ICD-10-CM

## 2019-08-16 HISTORY — PX: IR IMAGING GUIDED PORT INSERTION: IMG5740

## 2019-08-16 LAB — CBC
HCT: 33.5 % — ABNORMAL LOW (ref 36.0–46.0)
Hemoglobin: 10.1 g/dL — ABNORMAL LOW (ref 12.0–15.0)
MCH: 26.6 pg (ref 26.0–34.0)
MCHC: 30.1 g/dL (ref 30.0–36.0)
MCV: 88.4 fL (ref 80.0–100.0)
Platelets: 328 K/uL (ref 150–400)
RBC: 3.79 MIL/uL — ABNORMAL LOW (ref 3.87–5.11)
RDW: 16.4 % — ABNORMAL HIGH (ref 11.5–15.5)
WBC: 16.1 K/uL — ABNORMAL HIGH (ref 4.0–10.5)
nRBC: 0 % (ref 0.0–0.2)

## 2019-08-16 LAB — BASIC METABOLIC PANEL WITH GFR
Anion gap: 10 (ref 5–15)
BUN: 21 mg/dL (ref 8–23)
CO2: 22 mmol/L (ref 22–32)
Calcium: 7.8 mg/dL — ABNORMAL LOW (ref 8.9–10.3)
Chloride: 106 mmol/L (ref 98–111)
Creatinine, Ser: 1.5 mg/dL — ABNORMAL HIGH (ref 0.44–1.00)
GFR calc Af Amer: 40 mL/min — ABNORMAL LOW
GFR calc non Af Amer: 35 mL/min — ABNORMAL LOW
Glucose, Bld: 125 mg/dL — ABNORMAL HIGH (ref 70–99)
Potassium: 4.3 mmol/L (ref 3.5–5.1)
Sodium: 138 mmol/L (ref 135–145)

## 2019-08-16 LAB — CEA: CEA: 806 ng/mL — ABNORMAL HIGH (ref 0.0–4.7)

## 2019-08-16 MED ORDER — HEPARIN SOD (PORK) LOCK FLUSH 100 UNIT/ML IV SOLN
INTRAVENOUS | Status: AC | PRN
  Administered 2019-08-16: 500 [IU] via INTRAVENOUS

## 2019-08-16 MED ORDER — LIDOCAINE-EPINEPHRINE (PF) 2 %-1:200000 IJ SOLN
INTRAMUSCULAR | Status: AC | PRN
  Administered 2019-08-16 (×2): 10 mL

## 2019-08-16 MED ORDER — DEXTROSE 5 % IV SOLN
3.0000 g | INTRAVENOUS | Status: AC
  Administered 2019-08-16: 3 g via INTRAVENOUS
  Filled 2019-08-16: qty 3

## 2019-08-16 MED ORDER — LIDOCAINE-EPINEPHRINE (PF) 2 %-1:200000 IJ SOLN
INTRAMUSCULAR | Status: AC
  Filled 2019-08-16: qty 20

## 2019-08-16 MED ORDER — FENTANYL CITRATE (PF) 100 MCG/2ML IJ SOLN
INTRAMUSCULAR | Status: AC
  Filled 2019-08-16: qty 2

## 2019-08-16 MED ORDER — FENTANYL CITRATE (PF) 100 MCG/2ML IJ SOLN
INTRAMUSCULAR | Status: AC | PRN
  Administered 2019-08-16 (×2): 50 ug via INTRAVENOUS

## 2019-08-16 MED ORDER — MIDAZOLAM HCL 2 MG/2ML IJ SOLN
INTRAMUSCULAR | Status: AC | PRN
  Administered 2019-08-16 (×2): 1 mg via INTRAVENOUS

## 2019-08-16 MED ORDER — MIDAZOLAM HCL 2 MG/2ML IJ SOLN
INTRAMUSCULAR | Status: AC
  Filled 2019-08-16: qty 4

## 2019-08-16 MED ORDER — HEPARIN SOD (PORK) LOCK FLUSH 100 UNIT/ML IV SOLN
INTRAVENOUS | Status: AC
  Filled 2019-08-16: qty 5

## 2019-08-16 NOTE — Progress Notes (Signed)
PT Cancellation Note  Patient Details Name: Oretha Abby MRN: [id number] DOB: 29-Apr-1951   Cancelled Treatment:    Reason Eval/Treat Not Completed: Fatigue/lethargy limiting ability to participate(pt reports she's exhausted from news of having stage IV cancer, and has a procedure soon. She politely declined PT. Will follow.)  Philomena Doheny PT 08/16/2019  Acute Rehabilitation Services Pager [phone] Office [phone]

## 2019-08-16 NOTE — Progress Notes (Signed)
Patient ID: Amber Medina, female   DOB: 11-27-1950, 68 y.o.   MRN: [id number]    Referring Physician(s): Wardell Heath  Supervising Physician: Sandi Mariscal  Patient Status:  [facility] - In-pt  Chief Complaint:  Admitting for weakness. Team is requesting portacath placement for chemotherapy access.   Subjective:  68 y.o. female. History of colon cancer s/p resection admitted for weakness found to have liver lesions with concern for metastatic disease and possible carcinomatosis. Patient denies any fevers, headache, chest pain, SOB, nausea, vomiting or bleeding. Patient endorses abdominal pain that she states is related to her recent peritoneal biopsy and states that she has a non productive cough that is due to allergies.   Allergies: Compazine [prochlorperazine edisylate]  Medications: Prior to Admission medications   Medication Sig Start Date End Date Taking? Authorizing Provider  amiodarone (PACERONE) 200 MG tablet TAKE 1 TABLET BY MOUTH DAILY. Please make overdue appt with Dr. Radford Pax before anymore refills. 2nd attempt Patient taking differently: Take 100 mg by mouth daily.  08/10/19  Yes Turner, Eber Hong, MD  diltiazem (CARTIA XT) 240 MG 24 hr capsule Take 1 capsule (240 mg total) by mouth daily. Please make overdue annual appt for future refills. [phone]. 2nd attempt. Patient taking differently: Take 240 mg by mouth daily.  10/08/18  Yes Turner, Eber Hong, MD  metoprolol succinate (TOPROL-XL) 25 MG 24 hr tablet TAKE 1 TABLET BY MOUTH EVERY DAY. PLEASE MAKE AN APPOINTMENT WITH DOCTOR TURNER FOR FURHTER REFILLS. Patient taking differently: Take 25 mg by mouth daily.  12/13/18  Yes Turner, Eber Hong, MD  potassium chloride SA (K-DUR) 20 MEQ tablet TAKE 1 TABLET BY MOUTH TWICE DAILY PLEASE SCHEDULE APPOINTMENT(OVERDUE) Patient taking differently: Take 20 mEq by mouth 2 (two) times daily.  03/01/19  Yes Turner, Eber Hong, MD  rivaroxaban (XARELTO) 20 MG TABS tablet Take 20 mg by mouth daily with supper.   05/04/19  Yes [provider]  enoxaparin (LOVENOX) 150 MG/ML injection Inject 1 mL (150 mg total) into the skin every 12 (twelve) hours. Patient not taking: Reported on 08/13/2019 02/16/18   Aline August, MD  enoxaparin (LOVENOX) 150 MG/ML injection Inject 0.95 mLs (145 mg total) into the skin 2 (two) times daily. 02/16/18 02/16/19  Aline August, MD  enoxaparin (LOVENOX) 150 MG/ML injection Inject 1 mL (150 mg total) into the skin every 12 (twelve) hours for 3 days. Patient not taking: Reported on 08/13/2019 02/16/18 02/19/18  Aline August, MD  ferrous gluconate (FERGON) 324 MG tablet Take 1 tablet (324 mg total) by mouth 3 (three) times daily with meals. Patient not taking: Reported on 08/13/2019 06/16/17   Oswald Hillock, MD  furosemide (LASIX) 40 MG tablet Take 1 tablet (40 mg total) by mouth 2 (two) times daily. Patient taking differently: Take 40 mg by mouth daily.  02/16/18 08/13/19  Aline August, MD     Vital Signs: BP 116/62 (BP Location: Left Wrist)    Pulse 76    Temp 98.3 F (36.8 C) (Oral)    Resp (!) 24    Ht 5\' 7"  (1.702 m)    Wt (!) 314 lb (142.4 kg)    LMP  (LMP Unknown)    SpO2 93%    BMI 49.18 kg/m   Physical Exam Vital signs reviewed. Patient is awake and alert. Chest CTA bialterally. Heart sounds with regular rate and rhythm, abdomen soft. Patient denies pain with palpation positive bowel sounds. no lower extremity edema.    Imaging: Ct Abdomen Pelvis  Wo Contrast  Result Date: 08/13/2019 CLINICAL DATA:  Abdominal distension.  History of colon cancer. EXAM: CT ABDOMEN AND PELVIS WITHOUT CONTRAST TECHNIQUE: Multidetector CT imaging of the abdomen and pelvis was performed following the standard protocol without IV contrast. COMPARISON:  02/09/2018 FINDINGS: Lower chest: Lung bases are normal. Hepatobiliary: There are multiple new ill-defined hypodense liver masses with significant nodular enlargement of the entire left lobe of the liver. Findings suggest metastatic  disease in this patient with known history of colon cancer. Biliary tree is normal. Cholelithiasis is present. Pancreas: Normal. Spleen: Normal. Adrenals/Urinary Tract: Adrenal glands demonstrates small stable bilateral nodules. Kidneys are normal in size without hydronephrosis or nephrolithiasis. Ureters and bladder are normal. Stomach/Bowel: Stomach and small bowel are normal. Mild diverticulosis of the colon. Findings suggest previous partial right colectomy. Vascular/Lymphatic: Mild calcified plaque over the abdominal aorta which is normal in caliber. No significant adenopathy. Reproductive: Possible small right-sided fibroid over the fundus unchanged. Ovaries unremarkable. Other: Multiple peritoneal masses over the left mid abdomen with the largest measuring 3.1 cm compatible with metastatic disease. Musculoskeletal: Degenerative change of the spine with significant disc disease at the L4-5 level. IMPRESSION: 1.  No acute findings in the abdomen/pelvis. 2. Interval development of multiple ill-defined hypodense liver masses with significant nodular enlargement of the entire left lobe of the liver suggesting metastatic disease in this patient with history of colon cancer. Multiple new peritoneal masses compatible with metastatic disease. 3.  Aortic Atherosclerosis (ICD10-I70.0). 4.  Colonic diverticulosis. Electronically Signed   By: Marin Olp M.D.   On: 08/13/2019 07:55   Ct Biopsy  Result Date: 08/15/2019 CLINICAL DATA:  History of colon cancer in 2019 now presenting with multiple hepatic masses as well as a left lower quadrant peritoneal mass. EXAM: CT GUIDED CORE BIOPSY OF PERITONEAL ABDOMINAL MASS ANESTHESIA/SEDATION: 2.0 mg IV Versed; 100 mcg IV Fentanyl Total Moderate Sedation Time:  10 minutes. The patient's level of consciousness and physiologic status were continuously monitored during the procedure by Radiology nursing. PROCEDURE: The procedure risks, benefits, and alternatives were explained  to the patient. Questions regarding the procedure were encouraged and answered. The patient understands and consents to the procedure. CT was performed through the lower abdomen and upper pelvis in a supine position. The left abdominal wall was prepped with chlorhexidine in a sterile fashion, and a sterile drape was applied covering the operative field. A sterile gown and sterile gloves were used for the procedure. Local anesthesia was provided with 1% Lidocaine. A 17 gauge trocar needle was advanced under CT guidance to the level of a peritoneal abdominal mass. After confirming needle tip position, coaxial 18 gauge core biopsy samples were obtained. Three samples were obtained and submitted in formalin. COMPLICATIONS: None FINDINGS: Lobulated soft tissue mass of the peritoneal cavity just deep to the left abdominal wall in the left lower quadrant measures approximately 3.1 x 3.7 cm. Solid tissue was obtained. IMPRESSION: CT-guided core biopsy performed of left peritoneal abdominal mass. Electronically Signed   By: Aletta Edouard M.D.   On: 08/15/2019 17:30   Dg Chest Portable 1 View  Result Date: 08/13/2019 CLINICAL DATA:  Cough EXAM: PORTABLE CHEST 1 VIEW COMPARISON:  Feb 12, 2018 FINDINGS: There is mild cardiomegaly. Aortic knob calcifications are seen. Both lungs are clear. The visualized skeletal structures are unremarkable. IMPRESSION: No active disease. Electronically Signed   By: Prudencio Pair M.D.   On: 08/13/2019 03:44    Labs:  CBC: Recent Labs    08/13/19 0222 08/14/19  [id number] 08/15/19 0529 08/16/19 0501  WBC 20.5* 20.0* 19.4* 16.1*  HGB 12.0 10.8* 10.1* 10.1*  HCT 40.6 35.4* 34.0* 33.5*  PLT 510* PLATELET CLUMPS NOTED ON SMEAR, COUNT APPEARS INCREASED 362 328    COAGS: Recent Labs    08/13/19 1305 08/15/19 0529  INR 1.6* 1.6*    BMP: Recent Labs    08/13/19 0222 08/13/19 1305 08/14/19 0524 08/15/19 0529 08/16/19 0501  NA 136  --  137 139 138  K 4.4  --  4.7 3.7 4.3    CL 105  --  110 108 106  CO2 17*  --  14* 19* 22  GLUCOSE 126*  --  105* 124* 125*  BUN 32*  --  26* 25* 21  CALCIUM 9.1  --  8.3* 8.1* 7.8*  CREATININE 2.82* 2.51* 2.08* 1.80* 1.50*  GFRNONAA 16* 19* 24* 28* 35*  GFRAA 19* 22* 27* 32* 40*    LIVER FUNCTION TESTS: Recent Labs    08/13/19 0222  BILITOT 1.3*  AST 45*  ALT 26  ALKPHOS 256*  PROT 7.3  ALBUMIN 2.4*    Assessment and Plan: 68 y.o. female. History of colon cancer s/p resection, DVT (Xarelto held since admission) admitted for weakness found to have liver lesions with concern for metastatic disease and possible carcibimoatosis. Team is requesting portacath placement for chemotherapy treatment to be started while patient is inpatient. Down trending leokocytosis (16.1 on 11.10.20)  that per Team is related to disease process. No fevers, blood cultures negative X 3 days. Team initially started the patient on ancef for possible UTI but is in the process of discontinuing. Heparin prophylactic dosage last given at 6:42 on 11.10.20.Team is requesting portacath placement for chemotherapy access as inpatient  Risks and benefits of image guided port-a-catheter placement was discussed with the patient including, but not limited to bleeding, infection, pneumothorax, or fibrin sheath development and need for additional procedures.  All of the patient's questions were answered, patient is agreeable to proceed. Consent signed and in chart.    Electronically Signed: Avel Peace, NP 08/16/2019, 11:05 AM   I spent a total of 15 Minutes at the the patient's bedside AND on the patient's hospital floor or unit, greater than 50% of which was counseling/coordinating care for portacath placement

## 2019-08-16 NOTE — Progress Notes (Signed)
PROGRESS NOTE  Amber Medina [id number] DOB: 1950-12-02 DOA: 08/13/2019 PCP: [name], MD   LOS: 3 days   Brief Narrative / Interim history: 68 year old female with history of colon cancer status post resection in [place] but did not receive chemotherapy or radiation, A. fib on Xarelto, was admitted to the hospital on 08/13/2019 with generalized weakness, abdominal pain and distention and appetite loss.  CT scan on admission showed multiple liver lesions suspect for metastatic disease as well as peritoneal lesions suspect for carcinomatosis.  Oncology and IR were consulted for potential biopsy  Subjective / 24h Interval events: No complaints this morning, she is doing fairly well.  She feels stronger, denies any abdominal pain, no nausea or vomiting.  Underwent abdominal mass biopsy yesterday  Assessment & Plan: Active Problems:   Obesity, morbid ([place])   Persistent atrial fibrillation (HCC)   Adenocarcinoma of colon (HCC)   Liver lesion   Abdominal distension   Principal Problem Acute kidney injury on chronic kidney disease stage IIIa -Baseline creatinine was about 1.2 in May 2019, creatinine on admission at 2.8 in the setting of dehydration/poor p.o. intake. -She received IV fluids, creatinine has now improved to 1.5, I will discontinue fluids to avoid overload  Active Problems Liver lesions with concern for metastatic disease -She does have a history of colon adenocarcinoma status post resection but no systemic therapy -Oncology consulted.  Status post ultrasound-guided biopsy by IR on 08/15/2019 -I have discussed with oncology [name], he recommends chemotherapy ASAP, will ask IR to place a port today and she may get chemotherapy in the next 1 to 2 days while hospitalized  Leukocytosis -Possibly related to cancer/metastatic disease, no evidence of infection, she is afebrile, white count is stable, she was initially placed on empiric antibiotics for presumed UTI but she  has no symptoms and no culture data.  Discontinue antibiotics today  Chronic A. fib -Rate control with amiodarone, diltiazem, metoprolol.  Xarelto has been on hold for biopsy and port placement today, may be okay to resume within 24 hours  Morbid obesity -BMI 49, patient will benefit from weight loss  History of left popliteal vein DVT -On Xarelto which is now on hold  Chronic diastolic CHF -Appears euvolemic, stop fluids today to avoid overload    Scheduled Meds: . amiodarone  100 mg Oral Daily  . diltiazem  240 mg Oral Daily  . heparin  5,000 Units Subcutaneous Q8H  . metoprolol succinate  25 mg Oral Daily   Continuous Infusions:  PRN Meds:.acetaminophen **OR** acetaminophen, HYDROcodone-acetaminophen, iohexol  DVT prophylaxis: heparin Code Status: Full code Family Communication: d/w patient  Disposition Plan: home when ready   Consultants:  Oncology  IR  Procedures: None   Microbiology  None   Antimicrobials: None     Objective: Vitals:   08/16/19 0024 08/16/19 0259 08/16/19 0309 08/16/19 0649  BP: (!) 114/52 (!) 117/48 127/66 116/62  Pulse: 76 73  76  Resp: (!) 24 20  (!) 24  Temp: 97.8 F (36.6 C) 97.8 F (36.6 C)  98.3 F (36.8 C)  TempSrc: Oral Oral  Oral  SpO2: 95%  93% 93%  Weight:      Height:        Intake/Output Summary (Last 24 hours) at 08/16/2019 1059 Last data filed at 08/16/2019 0300 Gross per 24 hour  Intake 640.18 ml  Output -  Net 640.18 ml   Filed Weights   08/13/19 0156  Weight: (!) 142.4 kg    Examination:  Constitutional: No distress, appears comfortable Eyes: No scleral icterus ENMT: Moist mucous membranes Respiratory: Clear to auscultation bilaterally without wheezing or crackles, normal respiratory effort Cardiovascular: Regular rate and rhythm, no murmurs, no peripheral edema Abdomen: Soft, nontender, nondistended, bowel sounds positive Skin: No rashes seen Neurologic: No focal deficits, equal strength  Psychiatric: Normal judgment and insight. Alert and oriented x 3. Normal mood.    Data Reviewed: I have independently reviewed following labs and imaging studies   CBC: Recent Labs  Lab 08/13/19 0222 08/14/19 0734 08/15/19 0529 08/16/19 0501  WBC 20.5* 20.0* 19.4* 16.1*  NEUTROABS 17.4*  --   --   --   HGB 12.0 10.8* 10.1* 10.1*  HCT 40.6 35.4* 34.0* 33.5*  MCV 89.6 87.4 89.0 88.4  PLT 510* PLATELET CLUMPS NOTED ON SMEAR, COUNT APPEARS INCREASED 362 [id number]   Basic Metabolic Panel: Recent Labs  Lab 08/13/19 0222 08/13/19 1305 08/14/19 0524 08/15/19 0529 08/16/19 0501  NA 136  --  137 139 138  K 4.4  --  4.7 3.7 4.3  CL 105  --  110 108 106  CO2 17*  --  14* 19* 22  GLUCOSE 126*  --  105* 124* 125*  BUN 32*  --  26* 25* 21  CREATININE 2.82* 2.51* 2.08* 1.80* 1.50*  CALCIUM 9.1  --  8.3* 8.1* 7.8*  MG  --   --   --  1.9  --    GFR: Estimated Creatinine Clearance: 51.7 mL/min (A) (by C-G formula based on SCr of 1.5 mg/dL (H)). Liver Function Tests: Recent Labs  Lab 08/13/19 0222  AST 45*  ALT 26  ALKPHOS 256*  BILITOT 1.3*  PROT 7.3  ALBUMIN 2.4*   Recent Labs  Lab 08/13/19 0222  LIPASE 19   No results for input(s): AMMONIA in the last 168 hours. Coagulation Profile: Recent Labs  Lab 08/13/19 1305 08/15/19 0529  INR 1.6* 1.6*   Cardiac Enzymes: No results for input(s): CKTOTAL, CKMB, CKMBINDEX, TROPONINI in the last 168 hours. BNP (last 3 results) No results for input(s): PROBNP in the last 8760 hours. HbA1C: Recent Labs    08/13/19 1305  HGBA1C 6.9*   CBG: No results for input(s): GLUCAP in the last 168 hours. Lipid Profile: No results for input(s): CHOL, HDL, LDLCALC, TRIG, CHOLHDL, LDLDIRECT in the last 72 hours. Thyroid Function Tests: Recent Labs    08/13/19 1800  TSH 4.119   Anemia Panel: No results for input(s): VITAMINB12, FOLATE, FERRITIN, TIBC, IRON, RETICCTPCT in the last 72 hours. Urine analysis:    Component Value Date/Time    COLORURINE AMBER (A) 08/13/2019 0222   APPEARANCEUR CLOUDY (A) 08/13/2019 0222   LABSPEC 1.012 08/13/2019 0222   PHURINE 5.0 08/13/2019 0222   GLUCOSEU 50 (A) 08/13/2019 0222   HGBUR SMALL (A) 08/13/2019 0222   BILIRUBINUR NEGATIVE 08/13/2019 0222   KETONESUR NEGATIVE 08/13/2019 0222   PROTEINUR 100 (A) 08/13/2019 0222   NITRITE NEGATIVE 08/13/2019 0222   LEUKOCYTESUR NEGATIVE 08/13/2019 0222   Sepsis Labs: Invalid input(s): PROCALCITONIN, LACTICIDVEN  Recent Results (from the past 240 hour(s))  SARS CORONAVIRUS 2 (TAT 6-24 HRS) Nasopharyngeal Nasopharyngeal Swab     Status: None   Collection Time: 08/13/19  3:11 AM   Specimen: Nasopharyngeal Swab  Result Value Ref Range Status   SARS Coronavirus 2 NEGATIVE NEGATIVE Final    Comment: (NOTE) SARS-CoV-2 target nucleic acids are NOT DETECTED. The SARS-CoV-2 RNA is generally detectable in upper and lower respiratory specimens during the acute  phase of infection. Negative results do not preclude SARS-CoV-2 infection, do not rule out co-infections with other pathogens, and should not be used as the sole basis for treatment or other patient management decisions. Negative results must be combined with clinical observations, patient history, and epidemiological information. The expected result is Negative. Fact Sheet for Patients: SugarRoll.be Fact Sheet for Healthcare Providers: https://www.woods-mathews.com/ This test is not yet approved or cleared by the [place] FDA and  has been authorized for detection and/or diagnosis of SARS-CoV-2 by FDA under an Emergency Use Authorization (EUA). This EUA will remain  in effect (meaning this test can be used) for the duration of the COVID-19 declaration under Section 56 4(b)(1) of the Act, 21 U.S.C. section 360bbb-3(b)(1), unless the authorization is terminated or revoked sooner. Performed at [facility], [place] [address]  [place], [place] [address]   Blood culture (routine x 2)     Status: None (Preliminary result)   Collection Time: 08/13/19  8:15 AM   Specimen: BLOOD  Result Value Ref Range Status   Specimen Description   Final    BLOOD BLOOD LEFT FOREARM Performed at [facility] [address] [place], [place] [address]    Special Requests   Final    BOTTLES DRAWN AEROBIC AND ANAEROBIC Blood Culture adequate volume Performed at [facility] [address] [place], [place] [address]    Culture   Final    NO GROWTH 3 DAYS Performed at [facility], [place] [address], [place], [place] [address]    Report Status PENDING  Incomplete      Radiology Studies: Ct Biopsy  Result Date: 13-Sep-2019 CLINICAL DATA:  History of colon cancer in 2019 now presenting with multiple hepatic masses as well as a left lower quadrant peritoneal mass. EXAM: CT GUIDED CORE BIOPSY OF PERITONEAL ABDOMINAL MASS ANESTHESIA/SEDATION: 2.0 mg IV Versed; 100 mcg IV Fentanyl Total Moderate Sedation Time:  10 minutes. The patient's level of consciousness and physiologic status were continuously monitored during the procedure by Radiology nursing. PROCEDURE: The procedure risks, benefits, and alternatives were explained to the patient. Questions regarding the procedure were encouraged and answered. The patient understands and consents to the procedure. CT was performed through the lower abdomen and upper pelvis in a supine position. The left abdominal wall was prepped with chlorhexidine in a sterile fashion, and a sterile drape was applied covering the operative field. A sterile gown and sterile gloves were used for the procedure. Local anesthesia was provided with 1% Lidocaine. A 17 gauge trocar needle was advanced under CT guidance to the level of a peritoneal abdominal mass. After confirming needle tip position, coaxial 18 gauge core biopsy samples were obtained. Three samples were obtained and  submitted in formalin. COMPLICATIONS: None FINDINGS: Lobulated soft tissue mass of the peritoneal cavity just deep to the left abdominal wall in the left lower quadrant measures approximately 3.1 x 3.7 cm. Solid tissue was obtained. IMPRESSION: CT-guided core biopsy performed of left peritoneal abdominal mass. Electronically Signed   By: Aletta Edouard M.D.   On: 2019-09-13 17:30     Marzetta Board, MD, PhD [facility]  Contact via  [email or web address]  [place] P: [phone] F: [phone]

## 2019-08-16 NOTE — Procedures (Signed)
Pre Procedure Dx: Poor venous access Post Procedural Dx: Same  Successful placement of right IJ approach port-a-cath with tip at the superior caval atrial junction. The catheter is ready for immediate use.  Estimated Blood Loss: Minimal  Complications: None immediate.  Jay Stephen Turnbaugh, MD Pager #: [phone]   

## 2019-08-16 NOTE — Progress Notes (Signed)
Patient has a yellow MEWs score at this time only because of her respiratory rate (24).  She has been off and on with this tachypnea throughout the day.  Patient did report that she did not notice she was breathing fast, but was moving around in the bed quite a bit.  On call hospitalist was notified of MEWS score. Amber Medina

## 2019-08-16 NOTE — Progress Notes (Signed)
Notified by nurse that patient has persistent RR 24-28, resulting in yellow MEWS score.  Her BMI is 49 and she is thought to have metastatic disease.  Evaluation is pending.  No intervention appears to be indicated at this time since there does not appear to be an acute change at this time.   Carlyon Shadow, M.D.

## 2019-08-16 NOTE — Progress Notes (Signed)
MD responded back to page regarding increased respirations.  He said that we will continue to monitor patient. Patient's follow up vitals were WNL. Amber Medina

## 2019-08-16 NOTE — Progress Notes (Addendum)
HEMATOLOGY-ONCOLOGY PROGRESS NOTE  SUBJECTIVE: Reports discomfort in her right upper quadrant.  Denies nausea and vomiting.  Denies constipation.  REVIEW OF SYSTEMS:   Constitutional: Denies fevers, chills Respiratory: Denies cough, dyspnea or wheezes Cardiovascular: Denies palpitation, chest discomfort Gastrointestinal: Reports discomfort over right upper quadrant of abdomen.  Denies nausea, heartburn or change in bowel habits. Skin: Denies abnormal skin rashes Lymphatics: Denies new lymphadenopathy or easy bruising Neurological:Denies numbness, tingling or new weaknesses Behavioral/Psych: Mood is stable, no new changes  Extremities: No lower extremity edema All other systems were reviewed with the patient and are negative.  I have reviewed the past medical history, past surgical history, social history and family history with the patient and they are unchanged from previous note.   PHYSICAL EXAMINATION:  Vitals:   08/16/19 0309 08/16/19 0649  BP: 127/66 116/62  Pulse:  76  Resp:  (!) 24  Temp:  98.3 F (36.8 C)  SpO2: 93% 93%   Filed Weights   08/13/19 0156  Weight: (!) 314 lb (142.4 kg)    Intake/Output from previous day: 11/09 0701 - 11/10 0700 In: 640.2 [I.V.:440.2; IV Piggyback:200] Out: -   GENERAL:alert, no distress and comfortable SKIN: skin color, texture, turgor are normal, no rashes or significant lesions EYES: normal, Conjunctiva are pink and non-injected, sclera clear OROPHARYNX:no exudate, no erythema and lips, buccal mucosa, and tongue normal  NECK: supple, thyroid normal size, non-tender, without nodularity LYMPH:  no palpable lymphadenopathy in the cervical, axillary or inguinal LUNGS: clear to auscultation and percussion with normal breathing effort HEART: regular rate & rhythm and no murmurs and no lower extremity edema ABDOMEN: Positive bowel sounds, soft, nontender, hepatomegaly noted Musculoskeletal:no cyanosis of digits and no clubbing  NEURO:  alert & oriented x 3 with fluent speech, no focal motor/sensory deficits  LABORATORY DATA:  I have reviewed the data as listed CMP Latest Ref Rng & Units 08/16/2019 08/15/2019 08/14/2019  Glucose 70 - 99 mg/dL 125(H) 124(H) 105(H)  BUN 8 - 23 mg/dL 21 25(H) 26(H)  Creatinine 0.44 - 1.00 mg/dL 1.50(H) 1.80(H) 2.08(H)  Sodium 135 - 145 mmol/L 138 139 137  Potassium 3.5 - 5.1 mmol/L 4.3 3.7 4.7  Chloride 98 - 111 mmol/L 106 108 110  CO2 22 - 32 mmol/L 22 19(L) 14(L)  Calcium 8.9 - 10.3 mg/dL 7.8(L) 8.1(L) 8.3(L)  Total Protein 6.5 - 8.1 g/dL - - -  Total Bilirubin 0.3 - 1.2 mg/dL - - -  Alkaline Phos 38 - 126 U/L - - -  AST 15 - 41 U/L - - -  ALT 0 - 44 U/L - - -    Lab Results  Component Value Date   WBC 16.1 (H) 08/16/2019   HGB 10.1 (L) 08/16/2019   HCT 33.5 (L) 08/16/2019   MCV 88.4 08/16/2019   PLT 328 08/16/2019   NEUTROABS 17.4 (H) 08/13/2019    Ct Abdomen Pelvis Wo Contrast  Result Date: 08/13/2019 CLINICAL DATA:  Abdominal distension.  History of colon cancer. EXAM: CT ABDOMEN AND PELVIS WITHOUT CONTRAST TECHNIQUE: Multidetector CT imaging of the abdomen and pelvis was performed following the standard protocol without IV contrast. COMPARISON:  02/09/2018 FINDINGS: Lower chest: Lung bases are normal. Hepatobiliary: There are multiple new ill-defined hypodense liver masses with significant nodular enlargement of the entire left lobe of the liver. Findings suggest metastatic disease in this patient with known history of colon cancer. Biliary tree is normal. Cholelithiasis is present. Pancreas: Normal. Spleen: Normal. Adrenals/Urinary Tract: Adrenal glands demonstrates small  stable bilateral nodules. Kidneys are normal in size without hydronephrosis or nephrolithiasis. Ureters and bladder are normal. Stomach/Bowel: Stomach and small bowel are normal. Mild diverticulosis of the colon. Findings suggest previous partial right colectomy. Vascular/Lymphatic: Mild calcified plaque over the  abdominal aorta which is normal in caliber. No significant adenopathy. Reproductive: Possible small right-sided fibroid over the fundus unchanged. Ovaries unremarkable. Other: Multiple peritoneal masses over the left mid abdomen with the largest measuring 3.1 cm compatible with metastatic disease. Musculoskeletal: Degenerative change of the spine with significant disc disease at the L4-5 level. IMPRESSION: 1.  No acute findings in the abdomen/pelvis. 2. Interval development of multiple ill-defined hypodense liver masses with significant nodular enlargement of the entire left lobe of the liver suggesting metastatic disease in this patient with history of colon cancer. Multiple new peritoneal masses compatible with metastatic disease. 3.  Aortic Atherosclerosis (ICD10-I70.0). 4.  Colonic diverticulosis. Electronically Signed   By: Marin Olp M.D.   On: 08/13/2019 07:55   Ct Biopsy  Result Date: 08/15/2019 CLINICAL DATA:  History of colon cancer in 2019 now presenting with multiple hepatic masses as well as a left lower quadrant peritoneal mass. EXAM: CT GUIDED CORE BIOPSY OF PERITONEAL ABDOMINAL MASS ANESTHESIA/SEDATION: 2.0 mg IV Versed; 100 mcg IV Fentanyl Total Moderate Sedation Time:  10 minutes. The patient's level of consciousness and physiologic status were continuously monitored during the procedure by Radiology nursing. PROCEDURE: The procedure risks, benefits, and alternatives were explained to the patient. Questions regarding the procedure were encouraged and answered. The patient understands and consents to the procedure. CT was performed through the lower abdomen and upper pelvis in a supine position. The left abdominal wall was prepped with chlorhexidine in a sterile fashion, and a sterile drape was applied covering the operative field. A sterile gown and sterile gloves were used for the procedure. Local anesthesia was provided with 1% Lidocaine. A 17 gauge trocar needle was advanced under CT  guidance to the level of a peritoneal abdominal mass. After confirming needle tip position, coaxial 18 gauge core biopsy samples were obtained. Three samples were obtained and submitted in formalin. COMPLICATIONS: None FINDINGS: Lobulated soft tissue mass of the peritoneal cavity just deep to the left abdominal wall in the left lower quadrant measures approximately 3.1 x 3.7 cm. Solid tissue was obtained. IMPRESSION: CT-guided core biopsy performed of left peritoneal abdominal mass. Electronically Signed   By: Aletta Edouard M.D.   On: 08/15/2019 17:30   Dg Chest Portable 1 View  Result Date: 08/13/2019 CLINICAL DATA:  Cough EXAM: PORTABLE CHEST 1 VIEW COMPARISON:  Feb 12, 2018 FINDINGS: There is mild cardiomegaly. Aortic knob calcifications are seen. Both lungs are clear. The visualized skeletal structures are unremarkable. IMPRESSION: No active disease. Electronically Signed   By: Prudencio Pair M.D.   On: 08/13/2019 03:44    ASSESSMENT AND PLAN: 1. 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), right colectomy 04/22/2018  Ascending colon, moderately differentiated mucinous adenocarcinoma, no lymphovascular perineural invasion, no tumor deposits, tumor budding-low, 0/14 lymph nodes, tumor invades into the subserosa and is focally present at the inked serosal surface (pT4), no loss of mismatch repair protein expression  Colonoscopy 06/13/2017-ascending colon mass-adenocarcinoma, polyps in the ascending and sigmoid-tubular adenomas  CT abdomen/pelvis 08/13/2019-multiple peritoneal masses compatible with metastatic disease, multiple hypodense liver lesions consistent with metastatic disease  CEA 08/13/2019 -806.0  CT biopsy of a peritoneal mass 08/15/2019  2.   Acute pulmonary embolism and left popliteal DVT 02/05/2018 3.   Iron deficiency anemia  September 2018 4.  Atrial fibrillation 5.  Congestive heart failure 6.  Hypertension 7.  Morbid obesity 8.  Renal failure  Amber Medina appears stable.  Pathology from the  liver biopsy performed 08/15/2019 is still pending.  CEA is elevated and CT scans are consistent with metastatic colorectal cancer.   1.  Port-A-Cath placement today. 2.  Anticipate starting the patient on FOLFOX following Port-A-Cath placement and final biopsy result.   LOS: 3 days   Mikey Bussing, DNP, AGPCNP-BC, AOCNP 08/16/19 Amber Medina was interviewed and examined.  She appears stable.  I discussed the likely diagnosis of metastatic colon cancer, prognosis, and treatment options with her.  She understands no therapy will be curative.  I recommend proceeding with FOLFOX chemotherapy if the biopsy confirms a diagnosis of metastatic colon cancer.  We will submit tumor for molecular testing to see if she will be eligible for biologic targeted therapies. The plan is to start chemotherapy as soon as possible. Her renal function has improved with hydration.  She may be a candidate for a DOAC at discharge.

## 2019-08-16 NOTE — Care Management Important Message (Signed)
Important Message  Patient Details IM Letter given to Marney Doctor RN to present to the Patient Name: Amber Medina MRN: [id number] Date of Birth: 07/23/1951   Medicare Important Message Given:  Yes     Kerin Salen 08/16/2019, 9:07 AM

## 2019-08-17 DIAGNOSIS — C787 Secondary malignant neoplasm of liver and intrahepatic bile duct: Secondary | ICD-10-CM

## 2019-08-17 DIAGNOSIS — N1831 Chronic kidney disease, stage 3a: Secondary | ICD-10-CM

## 2019-08-17 DIAGNOSIS — D72825 Bandemia: Secondary | ICD-10-CM

## 2019-08-17 DIAGNOSIS — C189 Malignant neoplasm of colon, unspecified: Secondary | ICD-10-CM

## 2019-08-17 DIAGNOSIS — R140 Abdominal distension (gaseous): Secondary | ICD-10-CM

## 2019-08-17 DIAGNOSIS — K769 Liver disease, unspecified: Secondary | ICD-10-CM

## 2019-08-17 DIAGNOSIS — Z7189 Other specified counseling: Secondary | ICD-10-CM

## 2019-08-17 DIAGNOSIS — I4819 Other persistent atrial fibrillation: Secondary | ICD-10-CM

## 2019-08-17 DIAGNOSIS — D638 Anemia in other chronic diseases classified elsewhere: Secondary | ICD-10-CM

## 2019-08-17 LAB — CBC WITH DIFFERENTIAL/PLATELET
Abs Immature Granulocytes: 0.19 10*3/uL — ABNORMAL HIGH (ref 0.00–0.07)
Basophils Absolute: 0 10*3/uL (ref 0.0–0.1)
Basophils Relative: 0 %
Eosinophils Absolute: 0.1 10*3/uL (ref 0.0–0.5)
Eosinophils Relative: 0 %
HCT: 36.9 % (ref 36.0–46.0)
Hemoglobin: 11 g/dL — ABNORMAL LOW (ref 12.0–15.0)
Immature Granulocytes: 1 %
Lymphocytes Relative: 5 %
Lymphs Abs: 0.9 10*3/uL (ref 0.7–4.0)
MCH: 26.6 pg (ref 26.0–34.0)
MCHC: 29.8 g/dL — ABNORMAL LOW (ref 30.0–36.0)
MCV: 89.3 fL (ref 80.0–100.0)
Monocytes Absolute: 1.5 10*3/uL — ABNORMAL HIGH (ref 0.1–1.0)
Monocytes Relative: 9 %
Neutro Abs: 13.7 10*3/uL — ABNORMAL HIGH (ref 1.7–7.7)
Neutrophils Relative %: 85 %
Platelets: 332 10*3/uL (ref 150–400)
RBC: 4.13 MIL/uL (ref 3.87–5.11)
RDW: 16.6 % — ABNORMAL HIGH (ref 11.5–15.5)
WBC: 16.3 10*3/uL — ABNORMAL HIGH (ref 4.0–10.5)
nRBC: 0 % (ref 0.0–0.2)

## 2019-08-17 LAB — BASIC METABOLIC PANEL
Anion gap: 9 (ref 5–15)
BUN: 17 mg/dL (ref 8–23)
CO2: 21 mmol/L — ABNORMAL LOW (ref 22–32)
Calcium: 7.8 mg/dL — ABNORMAL LOW (ref 8.9–10.3)
Chloride: 106 mmol/L (ref 98–111)
Creatinine, Ser: 1.34 mg/dL — ABNORMAL HIGH (ref 0.44–1.00)
GFR calc Af Amer: 46 mL/min — ABNORMAL LOW (ref >60)
GFR calc non Af Amer: 40 mL/min — ABNORMAL LOW (ref >60)
Glucose, Bld: 192 mg/dL — ABNORMAL HIGH (ref 70–99)
Potassium: 3.6 mmol/L (ref 3.5–5.1)
Sodium: 136 mmol/L (ref 135–145)

## 2019-08-17 LAB — SURGICAL PATHOLOGY

## 2019-08-17 LAB — CBC
HCT: 35.2 % — ABNORMAL LOW (ref 36.0–46.0)
Hemoglobin: 10.5 g/dL — ABNORMAL LOW (ref 12.0–15.0)
MCH: 27 pg (ref 26.0–34.0)
MCHC: 29.8 g/dL — ABNORMAL LOW (ref 30.0–36.0)
MCV: 90.5 fL (ref 80.0–100.0)
Platelets: 313 10*3/uL (ref 150–400)
RBC: 3.89 MIL/uL (ref 3.87–5.11)
RDW: 16.6 % — ABNORMAL HIGH (ref 11.5–15.5)
WBC: 16.7 10*3/uL — ABNORMAL HIGH (ref 4.0–10.5)
nRBC: 0 % (ref 0.0–0.2)

## 2019-08-17 MED ORDER — SODIUM CHLORIDE 0.9% FLUSH: 10.0000 mL | INTRAVENOUS | Status: DC | PRN

## 2019-08-17 MED ORDER — RIVAROXABAN 20 MG PO TABS
20.0000 mg | ORAL_TABLET | Freq: Every day | ORAL | Status: DC
  Administered 2019-08-17 – 2019-08-22 (×6): 20 mg via ORAL
  Filled 2019-08-17 (×6): qty 1

## 2019-08-17 MED ORDER — CHLORHEXIDINE GLUCONATE CLOTH 2 % EX PADS
6.0000 | MEDICATED_PAD | Freq: Every day | CUTANEOUS | Status: DC
  Administered 2019-08-17 – 2019-08-23 (×7): 6 via TOPICAL

## 2019-08-17 MED ORDER — SODIUM CHLORIDE 0.9% FLUSH
10.0000 mL | Freq: Two times a day (BID) | INTRAVENOUS | Status: DC
  Administered 2019-08-17 – 2019-08-23 (×8): 10 mL

## 2019-08-17 NOTE — Progress Notes (Signed)
[place] for xarelto Indication: hx atrial fibrillation and DVT/PE  Allergies  Allergen Reactions  . Compazine [Prochlorperazine Edisylate] Swelling    Patient Measurements: Height: 5\' 7"  (170.2 cm) Weight: (!) 314 lb (142.4 kg) IBW/kg (Calculated) : 61.6 Heparin Dosing Weight:   Vital Signs: Temp: 98.4 F (36.9 C) (11/11 1420) Temp Source: Oral (11/11 1420) BP: 107/55 (11/11 1420) Pulse Rate: 78 (11/11 1420)  Labs: Recent Labs    08/15/19 0529 08/16/19 0501 08/17/19 1022  HGB 10.1* 10.1* 10.5*  HCT 34.0* 33.5* 35.2*  PLT 362 328 313  LABPROT 19.1*  --   --   INR 1.6*  --   --   CREATININE 1.80* 1.50* 1.34*    Estimated Creatinine Clearance: 57.9 mL/min (A) (by C-G formula based on SCr of 1.34 mg/dL (H)).   Medications:  - on xarelto 20 mg daily PTA (last dose taken on 08/11/19 at 8 PM)  Assessment: Patient is a 68 y.o F with hx colon cancer (s/p right colectomy on 04/22/18), afib and PE/DVT (02/05/18) on xarelto PTA, presented to the ED on 11/7 with c/o SOB, loss of appetite and generalized weakness.  Abdominal CT on 11/7 showed liver and peritoneal masses with evidence of metastatic disease.  She had a liver biopsy on 11/9 with pathology showing adenocarcinoma consistent with colon cancer. Port-a cath placement on 11/10.  Plan is to start chemotherapy on 11/12.  Xarelto was held on admission for liver biopsy.  Pharmacy is consulted to resume xarelto on 11/11.  - scr improving 1.34 (crcl~58) - cbc stable   Plan:  - d/c heparin SQ - resume home xarelto dose of 20 mg daily - pharmacy will sign off for xarelto, but will follow patient peripherally along with you   Drago Hammonds P 08/17/2019,2:45 PM

## 2019-08-17 NOTE — Progress Notes (Addendum)
HEMATOLOGY-ONCOLOGY PROGRESS NOTE  SUBJECTIVE: Tolerated Port-A-Cath placement well yesterday.  Reports discomfort in her right upper quadrant is somewhat improved.  When she lays on her back, she has more pressure on her abdomen.  Denies nausea and vomiting.  Denies constipation.  REVIEW OF SYSTEMS:   Constitutional: Denies fevers, chills Respiratory: Denies cough, dyspnea or wheezes Cardiovascular: Denies palpitation, chest discomfort Gastrointestinal: Reports discomfort over right upper quadrant of abdomen which she reports as improved today.  Denies nausea, heartburn or change in bowel habits. Skin: Denies abnormal skin rashes Lymphatics: Denies new lymphadenopathy or easy bruising Neurological:Denies numbness, tingling or new weaknesses Behavioral/Psych: Mood is stable, no new changes  Extremities: No lower extremity edema All other systems were reviewed with the patient and are negative.  I have reviewed the past medical history, past surgical history, social history and family history with the patient and they are unchanged from previous note.   PHYSICAL EXAMINATION:  Vitals:   08/16/19 2053 08/17/19 0507  BP: 106/60 114/60  Pulse: 73 76  Resp: 17 17  Temp: 99.5 F (37.5 C) 98.6 F (37 C)  SpO2: 94% 93%   Filed Weights   08/13/19 0156  Weight: (!) 314 lb (142.4 kg)    Intake/Output from previous day: No intake/output data recorded.  GENERAL:alert, no distress and comfortable SKIN: skin color, texture, turgor are normal, no rashes or significant lesions EYES: normal, Conjunctiva are pink and non-injected, sclera clear OROPHARYNX:no exudate, no erythema and lips, buccal mucosa, and tongue normal  NECK: supple, thyroid normal size, non-tender, without nodularity LYMPH:  no palpable lymphadenopathy in the cervical, axillary or inguinal LUNGS: clear to auscultation and percussion with normal breathing effort HEART: regular rate & rhythm and no murmurs and no lower  extremity edema ABDOMEN: Positive bowel sounds, soft, nontender, hepatomegaly noted Musculoskeletal:no cyanosis of digits and no clubbing  NEURO: alert & oriented x 3 with fluent speech, no focal motor/sensory deficits  Port-A-Cath dressing without drainage  LABORATORY DATA:  I have reviewed the data as listed CMP Latest Ref Rng & Units 08/16/2019 08/15/2019 08/14/2019  Glucose 70 - 99 mg/dL 125(H) 124(H) 105(H)  BUN 8 - 23 mg/dL 21 25(H) 26(H)  Creatinine 0.44 - 1.00 mg/dL 1.50(H) 1.80(H) 2.08(H)  Sodium 135 - 145 mmol/L 138 139 137  Potassium 3.5 - 5.1 mmol/L 4.3 3.7 4.7  Chloride 98 - 111 mmol/L 106 108 110  CO2 22 - 32 mmol/L 22 19(L) 14(L)  Calcium 8.9 - 10.3 mg/dL 7.8(L) 8.1(L) 8.3(L)  Total Protein 6.5 - 8.1 g/dL - - -  Total Bilirubin 0.3 - 1.2 mg/dL - - -  Alkaline Phos 38 - 126 U/L - - -  AST 15 - 41 U/L - - -  ALT 0 - 44 U/L - - -    Lab Results  Component Value Date   WBC 16.1 (H) 08/16/2019   HGB 10.1 (L) 08/16/2019   HCT 33.5 (L) 08/16/2019   MCV 88.4 08/16/2019   PLT 328 08/16/2019   NEUTROABS 17.4 (H) 08/13/2019    Ct Abdomen Pelvis Wo Contrast  Result Date: 08/13/2019 CLINICAL DATA:  Abdominal distension.  History of colon cancer. EXAM: CT ABDOMEN AND PELVIS WITHOUT CONTRAST TECHNIQUE: Multidetector CT imaging of the abdomen and pelvis was performed following the standard protocol without IV contrast. COMPARISON:  02/09/2018 FINDINGS: Lower chest: Lung bases are normal. Hepatobiliary: There are multiple new ill-defined hypodense liver masses with significant nodular enlargement of the entire left lobe of the liver. Findings suggest metastatic  disease in this patient with known history of colon cancer. Biliary tree is normal. Cholelithiasis is present. Pancreas: Normal. Spleen: Normal. Adrenals/Urinary Tract: Adrenal glands demonstrates small stable bilateral nodules. Kidneys are normal in size without hydronephrosis or nephrolithiasis. Ureters and bladder are  normal. Stomach/Bowel: Stomach and small bowel are normal. Mild diverticulosis of the colon. Findings suggest previous partial right colectomy. Vascular/Lymphatic: Mild calcified plaque over the abdominal aorta which is normal in caliber. No significant adenopathy. Reproductive: Possible small right-sided fibroid over the fundus unchanged. Ovaries unremarkable. Other: Multiple peritoneal masses over the left mid abdomen with the largest measuring 3.1 cm compatible with metastatic disease. Musculoskeletal: Degenerative change of the spine with significant disc disease at the L4-5 level. IMPRESSION: 1.  No acute findings in the abdomen/pelvis. 2. Interval development of multiple ill-defined hypodense liver masses with significant nodular enlargement of the entire left lobe of the liver suggesting metastatic disease in this patient with history of colon cancer. Multiple new peritoneal masses compatible with metastatic disease. 3.  Aortic Atherosclerosis (ICD10-I70.0). 4.  Colonic diverticulosis. Electronically Signed   By: Marin Olp M.D.   On: 08/13/2019 07:55   Ct Biopsy  Result Date: 08/15/2019 CLINICAL DATA:  History of colon cancer in 2019 now presenting with multiple hepatic masses as well as a left lower quadrant peritoneal mass. EXAM: CT GUIDED CORE BIOPSY OF PERITONEAL ABDOMINAL MASS ANESTHESIA/SEDATION: 2.0 mg IV Versed; 100 mcg IV Fentanyl Total Moderate Sedation Time:  10 minutes. The patient's level of consciousness and physiologic status were continuously monitored during the procedure by Radiology nursing. PROCEDURE: The procedure risks, benefits, and alternatives were explained to the patient. Questions regarding the procedure were encouraged and answered. The patient understands and consents to the procedure. CT was performed through the lower abdomen and upper pelvis in a supine position. The left abdominal wall was prepped with chlorhexidine in a sterile fashion, and a sterile drape was  applied covering the operative field. A sterile gown and sterile gloves were used for the procedure. Local anesthesia was provided with 1% Lidocaine. A 17 gauge trocar needle was advanced under CT guidance to the level of a peritoneal abdominal mass. After confirming needle tip position, coaxial 18 gauge core biopsy samples were obtained. Three samples were obtained and submitted in formalin. COMPLICATIONS: None FINDINGS: Lobulated soft tissue mass of the peritoneal cavity just deep to the left abdominal wall in the left lower quadrant measures approximately 3.1 x 3.7 cm. Solid tissue was obtained. IMPRESSION: CT-guided core biopsy performed of left peritoneal abdominal mass. Electronically Signed   By: Aletta Edouard M.D.   On: 08/15/2019 17:30   Dg Chest Portable 1 View  Result Date: 08/13/2019 CLINICAL DATA:  Cough EXAM: PORTABLE CHEST 1 VIEW COMPARISON:  Feb 12, 2018 FINDINGS: There is mild cardiomegaly. Aortic knob calcifications are seen. Both lungs are clear. The visualized skeletal structures are unremarkable. IMPRESSION: No active disease. Electronically Signed   By: Prudencio Pair M.D.   On: 08/13/2019 03:44   Ir Imaging Guided Port Insertion  Result Date: 08/16/2019 INDICATION: History of metastatic colon cancer. In need of durable intravenous access for chemotherapy administration EXAM: IMPLANTED PORT A CATH PLACEMENT WITH ULTRASOUND AND FLUOROSCOPIC GUIDANCE COMPARISON:  Chest CT-02/06/2018 MEDICATIONS: Ancef 3 gm IV; The antibiotic was administered within an appropriate time interval prior to skin puncture. ANESTHESIA/SEDATION: Moderate (conscious) sedation was employed during this procedure. A total of Versed 2 mg and Fentanyl 100 mcg was administered intravenously. Moderate Sedation Time: 28 minutes. The patient's level  of consciousness and vital signs were monitored continuously by radiology nursing throughout the procedure under my direct supervision. CONTRAST:  None FLUOROSCOPY TIME:  54  seconds (43 mGy) COMPLICATIONS: None immediate. PROCEDURE: The procedure, risks, benefits, and alternatives were explained to the patient. Questions regarding the procedure were encouraged and answered. The patient understands and consents to the procedure. The right neck and chest were prepped with chlorhexidine in a sterile fashion, and a sterile drape was applied covering the operative field. Maximum barrier sterile technique with sterile gowns and gloves were used for the procedure. A timeout was performed prior to the initiation of the procedure. Local anesthesia was provided with 1% lidocaine with epinephrine. After creating a small venotomy incision, a micropuncture kit was utilized to access the internal jugular vein. Real-time ultrasound guidance was utilized for vascular access including the acquisition of a permanent ultrasound image documenting patency of the accessed vessel. The microwire was utilized to measure appropriate catheter length. A subcutaneous port pocket was then created along the upper chest wall utilizing a combination of sharp and blunt dissection. The pocket was irrigated with sterile saline. A single lumen standard sized power injectable port was chosen for placement. The 8 Fr catheter was tunneled from the port pocket site to the venotomy incision. The port was placed in the pocket. The external catheter was trimmed to appropriate length. At the venotomy, an 8 Fr peel-away sheath was placed over a guidewire under fluoroscopic guidance. The catheter was then placed through the sheath and the sheath was removed. Final catheter positioning was confirmed and documented with a fluoroscopic spot radiograph. The port was accessed with a Huber needle, aspirated and flushed with heparinized saline. The venotomy site was closed with an interrupted 4-0 Vicryl suture. The port pocket incision was closed with interrupted 2-0 Vicryl suture and the skin was opposed with a running subcuticular 4-0  Vicryl suture. Dermabond and Steri-strips were applied to both incisions. Dressings were placed. The patient tolerated the procedure well without immediate post procedural complication. FINDINGS: After catheter placement, the tip lies within the superior cavoatrial junction. The catheter aspirates and flushes normally and is ready for immediate use. IMPRESSION: Successful placement of a right internal jugular approach power injectable Port-A-Cath. The catheter is ready for immediate use. Electronically Signed   By: Sandi Mariscal M.D.   On: 08/16/2019 17:28    ASSESSMENT AND PLAN: 1. 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), right colectomy 04/22/2018  Ascending colon, moderately differentiated mucinous adenocarcinoma, no lymphovascular perineural invasion, no tumor deposits, tumor budding-low, 0/14 lymph nodes, tumor invades into the subserosa and is focally present at the inked serosal surface (pT4), no loss of mismatch repair protein expression  Colonoscopy 06/13/2017-ascending colon mass-adenocarcinoma, polyps in the ascending and sigmoid-tubular adenomas  CT abdomen/pelvis 08/13/2019-multiple peritoneal masses compatible with metastatic disease, multiple hypodense liver lesions consistent with metastatic disease  CEA 08/13/2019 -806.0  CT biopsy of a peritoneal mass 08/15/2019, metastatic adenocarcinoma consistent with colon cancer  2.   Acute pulmonary embolism and left popliteal DVT 02/05/2018 3.   Iron deficiency anemia September 2018 4.  Atrial fibrillation 5.  Congestive heart failure 6.  Hypertension 7.  Morbid obesity 8.  Renal failure  Ms. Boxwell appears stable.  Pathology from the liver biopsy on 08/15/2019 shows adenocarcinoma consistent with colonic primary.  CEA is elevated and CT scans are consistent with metastatic colorectal cancer.  Labs from today have not yet been drawn but labs from yesterday showed stable anemia and continued improvement of her  renal function  1.  Access Port-A-Cath today  for IV fluids and lab draws.  2.  We will plan to begin FOLFOX for treatment of her metastatic colorectal cancer on 08/18/2019.    LOS: 4 days   Mikey Bussing, DNP, AGPCNP-BC, AOCNP 08/17/19 Ms. Falero has been diagnosed with metastatic colon cancer.  I discussed the pathology result with her.  I recommend FOLFOX chemotherapy.  I reviewed potential toxicities associated with the FOLFOX regimen including the chance for nausea vomiting, mucositis, diarrhea, alopecia, and hematologic toxicity.  We discussed the rash, sun sensitivity, hyperpigmentation, and hand/foot syndrome seen with 5-fluorouracil.  We discussed the allergic reaction various types of neuropathy associated with oxaliplatin.  She agrees to proceed.  The plan is to begin FOLFOX chemotherapy 08/18/2019.  A chemotherapy plan was entered.

## 2019-08-17 NOTE — Progress Notes (Signed)
PROGRESS NOTE  Amber Medina OLM:[id number] DOB: November 12, 1950   PCP: Amber Panda, MD  Patient is from: home  DOA: 08/13/2019 LOS: 4  Brief Narrative / Interim history: 68 year old female with history of colon cancer status post resection in [place] but did not receive chemotherapy or radiation, A. fib on Xarelto, was admitted to the hospital on 08/13/2019 with generalized weakness, abdominal pain and distention and appetite loss.  CT scan on admission showed multiple liver lesions suspect for metastatic disease as well as peritoneal lesions suspect for carcinomatosis.  Oncology and IR were consulted.  She underwent biopsy of abdominal mass on 08/15/2019.  Pathology confirmed metastatic colon cancer.  Had Port A Cath placed 08/16/2019.  Plan is to start chemotherapy 11/12.  Subjective: No major events overnight of this morning.  Reports some discomfort over RUQ.  Discomfort is sometimes positional and sometimes related to her breathing.  Denies nausea or vomiting.  Denies dyspnea or chest pain.  Denies UTI symptoms.  Objective: Vitals:   08/16/19 1845 08/16/19 2053 08/17/19 0507 08/17/19 1420  BP: (!) 110/91 106/60 114/60 (!) 107/55  Pulse: 74 73 76 78  Resp: 18 17 17 18   Temp: 98.6 F (37 C) 99.5 F (37.5 C) 98.6 F (37 C) 98.4 F (36.9 C)  TempSrc: Oral Oral Oral Oral  SpO2: 95% 94% 93% 95%  Weight:      Height:       No intake or output data in the 24 hours ending 08/17/19 1423 Filed Weights   08/13/19 0156  Weight: (!) 142.4 kg    Examination:  GENERAL: No acute distress.  Appears well.  HEENT: MMM.  Vision and hearing grossly intact.  NECK: Supple.  No apparent JVD.  RESP:  No IWOB. Good air movement bilaterally. CVS:  RRR. Heart sounds normal.  ABD/GI/GU: Bowel sounds present. Soft. Non tender but difficult exam due to body habitus. MSK/EXT:  No apparent deformity or edema. Moves extremities. SKIN: no apparent skin lesion or wound NEURO: Awake, alert and oriented  appropriately.  No gross deficit.  PSYCH: Calm. Normal affect.  Port a cath over right chest  Assessment & Plan: Acute kidney injury on chronic kidney disease stage IIIa: Baseline 1.2 in 02/2018.  Likely due to dehydration/poor p.o. intake.  Improving. -Creatinine 2.8 (admit)>>> 1.34 -Continue monitoring.  Encourage oral intake.  Colon adenocarcinoma status post resection now with multiple metastasis as noted on CT abdomen and pelvis. -Abdominal biopsy confirms metastasis of colon cancer -Port a cath placed on 08/16/2019. -Oncology consulted.  Status post ultrasound-guided biopsy by IR on 08/15/2019 -Oncology to start chemotherapy 11/12.  Leukocytosis: Unclear source.  Could be related to malignancy.  Afebrile.  Infectious work-up not revealing.  Off antibiotics.  Improving. -Continue trending-we will add differential to morning blood.  Chronic A. Fib: Rate and rhythm controlled -Continue home amiodarone, Cardizem and metoprolol -Resume home Xarelto.  Morbid obesity: BMI 49 -Encourage lifestyle change to lose weight.  History of left popliteal vein DVT/PE -Continue home Xarelto.  Chronic diastolic CHF: Stable.  No cardiopulmonary symptoms.  No signs of fluid overload. -Monitor fluid status -Sodium and fluid restriction  Anemia of chronic disease: Stable. -Monitor H&H -We will check anemia panel  Generalized weakness/deconditioning: Lives alone.  Has not been able to care for himself recently which is a new change for her. -PT/OT eval               DVT prophylaxis: Xarelto Code Status: Full code Family Communication: Patient and/or RN.  Available if any question. Disposition Plan: Remains inpatient Consultants: Oncology  Procedures:  08/15/2019-ultrasound-guided abdominal mass biopsy 08/16/2019-port a cath placement  Microbiology summarized: 11/7-COVID-19 negative 11/9-blood cultures negative  Sch Meds:  Scheduled Meds: . amiodarone  100 mg Oral Daily   . Chlorhexidine Gluconate Cloth  6 each Topical Daily  . diltiazem  240 mg Oral Daily  . heparin  5,000 Units Subcutaneous Q8H  . metoprolol succinate  25 mg Oral Daily  . sodium chloride flush  10-40 mL Intracatheter Q12H   Continuous Infusions: PRN Meds:.acetaminophen **OR** acetaminophen, HYDROcodone-acetaminophen, iohexol, sodium chloride flush  Antimicrobials: Anti-infectives (From admission, onward)   Start     Dose/Rate Route Frequency Ordered Stop   08/16/19 1230  ceFAZolin (ANCEF) 3 g in dextrose 5 % 50 mL IVPB     3 g 100 mL/hr over 30 Minutes Intravenous To Radiology 08/16/19 1223 08/16/19 1700   08/13/19 1300  cefTRIAXone (ROCEPHIN) 2 g in sodium chloride 0.9 % 100 mL IVPB  Status:  Discontinued     2 g 200 mL/hr over 30 Minutes Intravenous Every 24 hours 08/13/19 1228 08/16/19 1059       I have personally reviewed the following labs and images: CBC: Recent Labs  Lab 08/13/19 0222 08/14/19 0734 08/15/19 0529 08/16/19 0501 08/17/19 1022  WBC 20.5* 20.0* 19.4* 16.1* 16.7*  NEUTROABS 17.4*  --   --   --   --   HGB 12.0 10.8* 10.1* 10.1* 10.5*  HCT 40.6 35.4* 34.0* 33.5* 35.2*  MCV 89.6 87.4 89.0 88.4 90.5  PLT 510* PLATELET CLUMPS NOTED ON SMEAR, COUNT APPEARS INCREASED 362 328 313   BMP &GFR Recent Labs  Lab 08/13/19 0222 08/13/19 1305 08/14/19 0524 08/15/19 0529 08/16/19 0501 08/17/19 1022  NA 136  --  137 139 138 136  K 4.4  --  4.7 3.7 4.3 3.6  CL 105  --  110 108 106 106  CO2 17*  --  14* 19* 22 21*  GLUCOSE 126*  --  105* 124* 125* 192*  BUN 32*  --  26* 25* 21 17  CREATININE 2.82* 2.51* 2.08* 1.80* 1.50* 1.34*  CALCIUM 9.1  --  8.3* 8.1* 7.8* 7.8*  MG  --   --   --  1.9  --   --    Estimated Creatinine Clearance: 57.9 mL/min (A) (by C-G formula based on SCr of 1.34 mg/dL (H)). Liver & Pancreas: Recent Labs  Lab 08/13/19 0222  AST 45*  ALT 26  ALKPHOS 256*  BILITOT 1.3*  PROT 7.3  ALBUMIN 2.4*   Recent Labs  Lab 08/13/19 0222   LIPASE 19   No results for input(s): AMMONIA in the last 168 hours. Diabetic: No results for input(s): HGBA1C in the last 72 hours. No results for input(s): GLUCAP in the last 168 hours. Cardiac Enzymes: No results for input(s): CKTOTAL, CKMB, CKMBINDEX, TROPONINI in the last 168 hours. No results for input(s): PROBNP in the last 8760 hours. Coagulation Profile: Recent Labs  Lab 08/13/19 1305 08/15/19 0529  INR 1.6* 1.6*   Thyroid Function Tests: No results for input(s): TSH, T4TOTAL, FREET4, T3FREE, THYROIDAB in the last 72 hours. Lipid Profile: No results for input(s): CHOL, HDL, LDLCALC, TRIG, CHOLHDL, LDLDIRECT in the last 72 hours. Anemia Panel: No results for input(s): VITAMINB12, FOLATE, FERRITIN, TIBC, IRON, RETICCTPCT in the last 72 hours. Urine analysis:    Component Value Date/Time   COLORURINE AMBER (A) 08/13/2019 0222   APPEARANCEUR CLOUDY (A) 08/13/2019 0222  LABSPEC 1.012 08/13/2019 0222   PHURINE 5.0 08/13/2019 0222   GLUCOSEU 50 (A) 08/13/2019 0222   HGBUR SMALL (A) 08/13/2019 0222   BILIRUBINUR NEGATIVE 08/13/2019 0222   KETONESUR NEGATIVE 08/13/2019 0222   PROTEINUR 100 (A) 08/13/2019 0222   NITRITE NEGATIVE 08/13/2019 0222   LEUKOCYTESUR NEGATIVE 08/13/2019 0222   Sepsis Labs: Invalid input(s): PROCALCITONIN, [place]  Microbiology: Recent Results (from the past 240 hour(s))  SARS CORONAVIRUS 2 (TAT 6-24 HRS) Nasopharyngeal Nasopharyngeal Swab     Status: None   Collection Time: 08/13/19  3:11 AM   Specimen: Nasopharyngeal Swab  Result Value Ref Range Status   SARS Coronavirus 2 NEGATIVE NEGATIVE Final    Comment: (NOTE) SARS-CoV-2 target nucleic acids are NOT DETECTED. The SARS-CoV-2 RNA is generally detectable in upper and lower respiratory specimens during the acute phase of infection. Negative results do not preclude SARS-CoV-2 infection, do not rule out co-infections with other pathogens, and should not be used as the sole basis for  treatment or other patient management decisions. Negative results must be combined with clinical observations, patient history, and epidemiological information. The expected result is Negative. Fact Sheet for Patients: SugarRoll.be Fact Sheet for Healthcare Providers: https://www.woods-mathews.com/ This test is not yet approved or cleared by the [place] FDA and  has been authorized for detection and/or diagnosis of SARS-CoV-2 by FDA under an Emergency Use Authorization (EUA). This EUA will remain  in effect (meaning this test can be used) for the duration of the COVID-19 declaration under Section 56 4(b)(1) of the Act, 21 U.S.C. section 360bbb-3(b)(1), unless the authorization is terminated or revoked sooner. Performed at [facility], [place] [address], [place], [place] [address]   Blood culture (routine x 2)     Status: None (Preliminary result)   Collection Time: 08/13/19  8:15 AM   Specimen: BLOOD  Result Value Ref Range Status   Specimen Description   Final    BLOOD BLOOD LEFT FOREARM Performed at [facility] [address], [place], [place] [address]    Special Requests   Final    BOTTLES DRAWN AEROBIC AND ANAEROBIC Blood Culture adequate volume Performed at [facility] [address] [place], [place] [address]    Culture   Final    NO GROWTH 4 DAYS Performed at [facility], [place] [address], [place], [place] [address]    Report Status PENDING  Incomplete    Radiology Studies: Ir Imaging Guided Port Insertion  Result Date: 08/16/2019 INDICATION: History of metastatic colon cancer. In need of durable intravenous access for chemotherapy administration EXAM: IMPLANTED PORT A CATH PLACEMENT WITH ULTRASOUND AND FLUOROSCOPIC GUIDANCE COMPARISON:  Chest CT-02/06/2018 MEDICATIONS: Ancef 3 gm IV; The antibiotic was administered within an appropriate time interval prior to skin  puncture. ANESTHESIA/SEDATION: Moderate (conscious) sedation was employed during this procedure. A total of Versed 2 mg and Fentanyl 100 mcg was administered intravenously. Moderate Sedation Time: 28 minutes. The patient's level of consciousness and vital signs were monitored continuously by radiology nursing throughout the procedure under my direct supervision. CONTRAST:  None FLUOROSCOPY TIME:  54 seconds (43 mGy) COMPLICATIONS: None immediate. PROCEDURE: The procedure, risks, benefits, and alternatives were explained to the patient. Questions regarding the procedure were encouraged and answered. The patient understands and consents to the procedure. The right neck and chest were prepped with chlorhexidine in a sterile fashion, and a sterile drape was applied covering the operative field. Maximum barrier sterile technique with sterile gowns  and gloves were used for the procedure. A timeout was performed prior to the initiation of the procedure. Local anesthesia was provided with 1% lidocaine with epinephrine. After creating a small venotomy incision, a micropuncture kit was utilized to access the internal jugular vein. Real-time ultrasound guidance was utilized for vascular access including the acquisition of a permanent ultrasound image documenting patency of the accessed vessel. The microwire was utilized to measure appropriate catheter length. A subcutaneous port pocket was then created along the upper chest wall utilizing a combination of sharp and blunt dissection. The pocket was irrigated with sterile saline. A single lumen standard sized power injectable port was chosen for placement. The 8 Fr catheter was tunneled from the port pocket site to the venotomy incision. The port was placed in the pocket. The external catheter was trimmed to appropriate length. At the venotomy, an 8 Fr peel-away sheath was placed over a guidewire under fluoroscopic guidance. The catheter was then placed through the sheath and  the sheath was removed. Final catheter positioning was confirmed and documented with a fluoroscopic spot radiograph. The port was accessed with a Huber needle, aspirated and flushed with heparinized saline. The venotomy site was closed with an interrupted 4-0 Vicryl suture. The port pocket incision was closed with interrupted 2-0 Vicryl suture and the skin was opposed with a running subcuticular 4-0 Vicryl suture. Dermabond and Steri-strips were applied to both incisions. Dressings were placed. The patient tolerated the procedure well without immediate post procedural complication. FINDINGS: After catheter placement, the tip lies within the superior cavoatrial junction. The catheter aspirates and flushes normally and is ready for immediate use. IMPRESSION: Successful placement of a right internal jugular approach power injectable Port-A-Cath. The catheter is ready for immediate use. Electronically Signed   By: Sandi Mariscal M.D.   On: 08/16/2019 17:28     25 minutes with more than 50% spent in reviewing records, counseling patient/family and coordinating care.  Morrell Fluke T. [place]  If 7PM-7AM, please contact night-coverage [email or web address] Password [email or web address] 08/17/2019, 2:23 PM

## 2019-08-17 NOTE — Progress Notes (Signed)
Patient made aware that port needs to be accessed. Patient states that port is still very tender and prefers to wait until am to have port accessed.Patient reports port is feeling better but still prefers to wait until am for access.

## 2019-08-18 ENCOUNTER — Encounter: Payer: Self-pay | Admitting: *Deleted

## 2019-08-18 DIAGNOSIS — I809 Phlebitis and thrombophlebitis of unspecified site: Secondary | ICD-10-CM

## 2019-08-18 DIAGNOSIS — Z86718 Personal history of other venous thrombosis and embolism: Secondary | ICD-10-CM

## 2019-08-18 DIAGNOSIS — R970 Elevated carcinoembryonic antigen [CEA]: Secondary | ICD-10-CM

## 2019-08-18 DIAGNOSIS — Z86711 Personal history of pulmonary embolism: Secondary | ICD-10-CM

## 2019-08-18 DIAGNOSIS — N179 Acute kidney failure, unspecified: Secondary | ICD-10-CM

## 2019-08-18 LAB — CULTURE, BLOOD (ROUTINE X 2)
Culture: NO GROWTH
Special Requests: ADEQUATE

## 2019-08-18 LAB — BASIC METABOLIC PANEL
Anion gap: 9 (ref 5–15)
BUN: 19 mg/dL (ref 8–23)
CO2: 20 mmol/L — ABNORMAL LOW (ref 22–32)
Calcium: 7.4 mg/dL — ABNORMAL LOW (ref 8.9–10.3)
Chloride: 103 mmol/L (ref 98–111)
Creatinine, Ser: 1.23 mg/dL — ABNORMAL HIGH (ref 0.44–1.00)
GFR calc Af Amer: 51 mL/min — ABNORMAL LOW (ref >60)
GFR calc non Af Amer: 44 mL/min — ABNORMAL LOW (ref >60)
Glucose, Bld: 112 mg/dL — ABNORMAL HIGH (ref 70–99)
Potassium: 4.2 mmol/L (ref 3.5–5.1)
Sodium: 132 mmol/L — ABNORMAL LOW (ref 135–145)

## 2019-08-18 LAB — VITAMIN B12: Vitamin B-12: 722 pg/mL (ref 180–914)

## 2019-08-18 LAB — CBC
HCT: 35.8 % — ABNORMAL LOW (ref 36.0–46.0)
Hemoglobin: 10.7 g/dL — ABNORMAL LOW (ref 12.0–15.0)
MCH: 26.5 pg (ref 26.0–34.0)
MCHC: 29.9 g/dL — ABNORMAL LOW (ref 30.0–36.0)
MCV: 88.6 fL (ref 80.0–100.0)
Platelets: 255 10*3/uL (ref 150–400)
RBC: 4.04 MIL/uL (ref 3.87–5.11)
RDW: 16.7 % — ABNORMAL HIGH (ref 11.5–15.5)
WBC: 16.6 10*3/uL — ABNORMAL HIGH (ref 4.0–10.5)
nRBC: 0 % (ref 0.0–0.2)

## 2019-08-18 LAB — IRON AND TIBC
Iron: 18 ug/dL — ABNORMAL LOW (ref 28–170)
Saturation Ratios: 16 % (ref 10.4–31.8)
TIBC: 114 ug/dL — ABNORMAL LOW (ref 250–450)
UIBC: 96 ug/dL

## 2019-08-18 LAB — RETICULOCYTES
Immature Retic Fract: 29.6 % — ABNORMAL HIGH (ref 2.3–15.9)
RBC.: 3.87 MIL/uL (ref 3.87–5.11)
Retic Count, Absolute: 46.8 10*3/uL (ref 19.0–186.0)
Retic Ct Pct: 1.2 % (ref 0.4–3.1)

## 2019-08-18 LAB — MAGNESIUM: Magnesium: 1.5 mg/dL — ABNORMAL LOW (ref 1.7–2.4)

## 2019-08-18 LAB — OSMOLALITY: Osmolality: 290 mOsm/kg (ref 275–295)

## 2019-08-18 LAB — FERRITIN: Ferritin: 429 ng/mL — ABNORMAL HIGH (ref 11–307)

## 2019-08-18 LAB — FOLATE: Folate: >100 ng/mL (ref >5.9)

## 2019-08-18 MED ORDER — LIP MEDEX EX OINT
TOPICAL_OINTMENT | CUTANEOUS | Status: AC
  Administered 2019-08-18: 15:00:00
  Filled 2019-08-18: qty 7

## 2019-08-18 MED ORDER — SODIUM CHLORIDE 0.9 % IV SOLN
10.0000 mg | Freq: Once | INTRAVENOUS | Status: AC
  Administered 2019-08-18: 10 mg via INTRAVENOUS
  Filled 2019-08-18: qty 1

## 2019-08-18 MED ORDER — OXALIPLATIN CHEMO INJECTION 100 MG/20ML
65.0000 mg/m2 | Freq: Once | INTRAVENOUS | Status: AC
  Administered 2019-08-18: 170 mg via INTRAVENOUS
  Filled 2019-08-18: qty 34

## 2019-08-18 MED ORDER — FUROSEMIDE 20 MG PO TABS
20.0000 mg | ORAL_TABLET | Freq: Every day | ORAL | Status: DC
  Administered 2019-08-18 – 2019-08-20 (×3): 20 mg via ORAL
  Filled 2019-08-18 (×3): qty 1

## 2019-08-18 MED ORDER — LEUCOVORIN CALCIUM INJECTION 350 MG
300.0000 mg/m2 | Freq: Once | INTRAVENOUS | Status: AC
  Administered 2019-08-18: 778 mg via INTRAVENOUS
  Filled 2019-08-18: qty 38.9

## 2019-08-18 MED ORDER — PALONOSETRON HCL INJECTION 0.25 MG/5ML
0.2500 mg | Freq: Once | INTRAVENOUS | Status: AC
  Administered 2019-08-18: 0.25 mg via INTRAVENOUS
  Filled 2019-08-18: qty 5

## 2019-08-18 MED ORDER — SODIUM CHLORIDE 0.9 % IV SOLN
2000.0000 mg/m2 | Freq: Once | INTRAVENOUS | Status: AC
  Administered 2019-08-18: 5200 mg via INTRAVENOUS
  Filled 2019-08-18: qty 100

## 2019-08-18 MED ORDER — MAGNESIUM SULFATE 2 GM/50ML IV SOLN
2.0000 g | Freq: Once | INTRAVENOUS | Status: AC
  Administered 2019-08-18: 2 g via INTRAVENOUS
  Filled 2019-08-18: qty 50

## 2019-08-18 NOTE — Progress Notes (Signed)
PROGRESS NOTE  Amber Medina [id number] DOB: 1951-02-03   PCP: [name], MD  Patient is from: home  DOA: 08/13/2019 LOS: 5  Brief Narrative / Interim history: 68 year old female with history of colon cancer status post resection in [place] but did not receive chemotherapy or radiation, A. fib on Xarelto, was admitted to the hospital on 08/13/2019 with generalized weakness, abdominal pain and distention and appetite loss.  CT scan on admission showed multiple liver lesions suspect for metastatic disease as well as peritoneal lesions suspect for carcinomatosis.  Oncology and IR were consulted.  She underwent biopsy of abdominal mass on 08/15/2019.  Pathology confirmed metastatic colon cancer.  Had Port A Cath placed 08/16/2019.  Plan is to start chemotherapy 11/12.  Subjective: No major events overnight of this morning.  Abdominal pain improved.  Denies nausea, vomiting, chest pain or dyspnea.  To start chemotherapy today.  Objective: Vitals:   08/16/19 2053 08/17/19 0507 08/17/19 1420 08/18/19 0702  BP: 106/60 114/60 (!) 107/55 131/66  Pulse: 73 76 78 72  Resp: 17 17 18 16   Temp: 99.5 F (37.5 C) 98.6 F (37 C) 98.4 F (36.9 C) 98.2 F (36.8 C)  TempSrc: Oral Oral Oral Oral  SpO2: 94% 93% 95% 95%  Weight:      Height:       No intake or output data in the 24 hours ending 08/18/19 1316 Filed Weights   08/13/19 0156  Weight: (!) 142.4 kg    Examination:  GENERAL: No acute distress.  Appears well.  HEENT: MMM.  Vision and hearing grossly intact.  NECK: Supple.  No apparent JVD.  RESP:  No IWOB. Good air movement bilaterally. CVS:  RRR. Heart sounds normal.  ABD/GI/GU: Bowel sounds present.  Refused abdominal exam out of concern for pain. MSK/EXT:  Moves extremities. No apparent deformity or edema.  SKIN: no apparent skin lesion or wound NEURO: Awake, alert and oriented appropriately.  No apparent focal neuro deficit. PSYCH: Calm. Normal affect.  Port a cath over  right chest  Assessment & Plan: Acute kidney injury on chronic kidney disease stage IIIa: Baseline 1.2 in 02/2018.  Likely due to dehydration/poor p.o. intake.  Improving. -Creatinine 2.8 (admit)>>> 1.23 -Continue monitoring.  Encourage oral intake.  Colon adenocarcinoma status post resection now with multiple metastasis as noted on CT abdomen and pelvis. -Abdominal biopsy confirms metastasis of colon cancer -Port a cath placed on 08/16/2019. -Ultrasound-guided biopsy of abdominal wall on 08/15/2019 confirmed colon cancer -Oncology to start chemotherapy 11/12.  Leukocytosis/bandemia: Unclear source.  Could be related to malignancy.  Afebrile.  Infectious work-up not revealing.  Off antibiotics.  Improving. -Continue trending  Chronic A. Fib: Rate and rhythm controlled -Continue home amiodarone, Cardizem, metoprolol and Xarelto.  Morbid obesity: BMI 49 -Encourage lifestyle change to lose weight.  History of left popliteal vein DVT/PE -Continue home Xarelto.  Chronic diastolic CHF: Stable.  Echo in 5/20 with EF of 55 to 60%, G1 DD.  No cardiopulmonary symptoms.  No signs of fluid overload. No I&O. -Resume home Lasix at lower dose. -Daily weight, intake output and renal function -Fluid restriction  Anemia of chronic disease: Stable. -Monitor H&H -Check anemia panel  Generalized weakness/deconditioning: Lives alone.  Has not been able to care for himself recently which is a new change for her. -PT/OT eval-likely SNF.               DVT prophylaxis: Xarelto Code Status: Full code Family Communication: Patient and/or RN. Available if any  question. Disposition Plan: Remains inpatient pending clinical improvement and clearance by oncology Consultants: Oncology  Procedures:  08/15/2019-ultrasound-guided abdominal mass biopsy 08/16/2019-port a cath placement  Microbiology summarized: 11/7-COVID-19 negative 11/9-blood cultures negative  Sch Meds:  Scheduled Meds: .  amiodarone  100 mg Oral Daily  . Chlorhexidine Gluconate Cloth  6 each Topical Daily  . diltiazem  240 mg Oral Daily  . FLUOROURACIL (ADRUCIL) CHEMO infusion For Inpatient Use  2,000 mg/m2 (Treatment Plan Recorded) Intravenous Once  . leucovorin (WELLCOVORIN) IV infusion  300 mg/m2 (Treatment Plan Recorded) Intravenous Once  . metoprolol succinate  25 mg Oral Daily  . oxaliplatin (ELOXATIN) CHEMO IV infusion  65 mg/m2 (Treatment Plan Recorded) Intravenous Once  . rivaroxaban  20 mg Oral Q supper  . sodium chloride flush  10-40 mL Intracatheter Q12H   Continuous Infusions: PRN Meds:.acetaminophen **OR** acetaminophen, HYDROcodone-acetaminophen, iohexol, sodium chloride flush  Antimicrobials: Anti-infectives (From admission, onward)   Start     Dose/Rate Route Frequency Ordered Stop   08/16/19 1230  ceFAZolin (ANCEF) 3 g in dextrose 5 % 50 mL IVPB     3 g 100 mL/hr over 30 Minutes Intravenous To Radiology 08/16/19 1223 08/16/19 1700   08/13/19 1300  cefTRIAXone (ROCEPHIN) 2 g in sodium chloride 0.9 % 100 mL IVPB  Status:  Discontinued     2 g 200 mL/hr over 30 Minutes Intravenous Every 24 hours 08/13/19 1228 08/16/19 1059       I have personally reviewed the following labs and images: CBC: Recent Labs  Lab 08/13/19 0222  08/15/19 0529 08/16/19 0501 08/17/19 1022 08/17/19 1513 08/18/19 0459  WBC 20.5*   < > 19.4* 16.1* 16.7* 16.3* 16.6*  NEUTROABS 17.4*  --   --   --   --  13.7*  --   HGB 12.0   < > 10.1* 10.1* 10.5* 11.0* 10.7*  HCT 40.6   < > 34.0* 33.5* 35.2* 36.9 35.8*  MCV 89.6   < > 89.0 88.4 90.5 89.3 88.6  PLT 510*   < > 362 328 313 332 255   < > = values in this interval not displayed.   BMP &GFR Recent Labs  Lab 08/14/19 0524 08/15/19 0529 08/16/19 0501 08/17/19 1022 08/18/19 0459  NA 137 139 138 136 132*  K 4.7 3.7 4.3 3.6 4.2  CL 110 108 106 106 103  CO2 14* 19* 22 21* 20*  GLUCOSE 105* 124* 125* 192* 112*  BUN 26* 25* 21 17 19   CREATININE 2.08*  1.80* 1.50* 1.34* 1.23*  CALCIUM 8.3* 8.1* 7.8* 7.8* 7.4*  MG  --  1.9  --   --  1.5*   Estimated Creatinine Clearance: 63.1 mL/min (A) (by C-G formula based on SCr of 1.23 mg/dL (H)). Liver & Pancreas: Recent Labs  Lab 08/13/19 0222  AST 45*  ALT 26  ALKPHOS 256*  BILITOT 1.3*  PROT 7.3  ALBUMIN 2.4*   Recent Labs  Lab 08/13/19 0222  LIPASE 19   No results for input(s): AMMONIA in the last 168 hours. Diabetic: No results for input(s): HGBA1C in the last 72 hours. No results for input(s): GLUCAP in the last 168 hours. Cardiac Enzymes: No results for input(s): CKTOTAL, CKMB, CKMBINDEX, TROPONINI in the last 168 hours. No results for input(s): PROBNP in the last 8760 hours. Coagulation Profile: Recent Labs  Lab 08/13/19 1305 08/15/19 0529  INR 1.6* 1.6*   Thyroid Function Tests: No results for input(s): TSH, T4TOTAL, FREET4, T3FREE, THYROIDAB in the last  72 hours. Lipid Profile: No results for input(s): CHOL, HDL, LDLCALC, TRIG, CHOLHDL, LDLDIRECT in the last 72 hours. Anemia Panel: No results for input(s): VITAMINB12, FOLATE, FERRITIN, TIBC, IRON, RETICCTPCT in the last 72 hours. Urine analysis:    Component Value Date/Time   COLORURINE AMBER (A) 08/13/2019 0222   APPEARANCEUR CLOUDY (A) 08/13/2019 0222   LABSPEC 1.012 08/13/2019 0222   PHURINE 5.0 08/13/2019 0222   GLUCOSEU 50 (A) 08/13/2019 0222   HGBUR SMALL (A) 08/13/2019 0222   BILIRUBINUR NEGATIVE 08/13/2019 0222   KETONESUR NEGATIVE 08/13/2019 0222   PROTEINUR 100 (A) 08/13/2019 0222   NITRITE NEGATIVE 08/13/2019 0222   LEUKOCYTESUR NEGATIVE 08/13/2019 0222   Sepsis Labs: Invalid input(s): PROCALCITONIN, [place]  Microbiology: Recent Results (from the past 240 hour(s))  SARS CORONAVIRUS 2 (TAT 6-24 HRS) Nasopharyngeal Nasopharyngeal Swab     Status: None   Collection Time: 08/13/19  3:11 AM   Specimen: Nasopharyngeal Swab  Result Value Ref Range Status   SARS Coronavirus 2 NEGATIVE NEGATIVE  Final    Comment: (NOTE) SARS-CoV-2 target nucleic acids are NOT DETECTED. The SARS-CoV-2 RNA is generally detectable in upper and lower respiratory specimens during the acute phase of infection. Negative results do not preclude SARS-CoV-2 infection, do not rule out co-infections with other pathogens, and should not be used as the sole basis for treatment or other patient management decisions. Negative results must be combined with clinical observations, patient history, and epidemiological information. The expected result is Negative. Fact Sheet for Patients: SugarRoll.be Fact Sheet for Healthcare Providers: https://www.woods-mathews.com/ This test is not yet approved or cleared by the [place] FDA and  has been authorized for detection and/or diagnosis of SARS-CoV-2 by FDA under an Emergency Use Authorization (EUA). This EUA will remain  in effect (meaning this test can be used) for the duration of the COVID-19 declaration under Section 56 4(b)(1) of the Act, 21 U.S.C. section 360bbb-3(b)(1), unless the authorization is terminated or revoked sooner. Performed at [facility], [place] [address], [place], [place] [address]   Blood culture (routine x 2)     Status: None   Collection Time: 08/13/19  8:15 AM   Specimen: BLOOD  Result Value Ref Range Status   Specimen Description   Final    BLOOD BLOOD LEFT FOREARM Performed at [facility] [address], [place], [place] [address]    Special Requests   Final    BOTTLES DRAWN AEROBIC AND ANAEROBIC Blood Culture adequate volume Performed at [facility] [address] [place], [place] [address]    Culture   Final    NO GROWTH 5 DAYS Performed at [facility], [place] [address], [place],  [address]    Report Status 08/18/2019 FINAL  Final    Radiology Studies: No results found.  Amber Medina T. [place]  If 7PM-7AM,  please contact night-coverage [email or web address] Password [email or web address] 08/18/2019, 1:16 PM

## 2019-08-18 NOTE — Progress Notes (Addendum)
HEMATOLOGY-ONCOLOGY PROGRESS NOTE  SUBJECTIVE: Reports that the discomfort in her right upper abdomen is improving.  She has no nausea or vomiting.  Denies constipation diarrhea.  REVIEW OF SYSTEMS:   Constitutional: Denies fevers, chills Respiratory: Denies cough, dyspnea or wheezes Cardiovascular: Denies palpitation, chest discomfort Gastrointestinal: Right upper quadrant abdominal pain continues to improve.  Denies nausea, heartburn or change in bowel habits. Skin: Denies abnormal skin rashes Lymphatics: Denies new lymphadenopathy or easy bruising Neurological:Denies numbness, tingling or new weaknesses Behavioral/Psych: Mood is stable, no new changes  Extremities: No lower extremity edema All other systems were reviewed with the patient and are negative.  I have reviewed the past medical history, past surgical history, social history and family history with the patient and they are unchanged from previous note.   PHYSICAL EXAMINATION:  Vitals:   08/17/19 1420 08/18/19 0702  BP: (!) 107/55 131/66  Pulse: 78 72  Resp: 18 16  Temp: 98.4 F (36.9 C) 98.2 F (36.8 C)  SpO2: 95% 95%   Filed Weights   08/13/19 0156  Weight: (!) 314 lb (142.4 kg)    Intake/Output from previous day: No intake/output data recorded.  GENERAL:alert, no distress and comfortable SKIN: skin color, texture, turgor are normal, no rashes or significant lesions EYES: normal, Conjunctiva are pink and non-injected, sclera clear OROPHARYNX:no exudate, no erythema and lips, buccal mucosa, and tongue normal  NECK: supple, thyroid normal size, non-tender, without nodularity LYMPH:  no palpable lymphadenopathy in the cervical, axillary or inguinal LUNGS: clear to auscultation and percussion with normal breathing effort HEART: regular rate & rhythm and no murmurs and no lower extremity edema ABDOMEN: Positive bowel sounds, soft, nontender, hepatomegaly noted Musculoskeletal:no cyanosis of digits and no  clubbing  NEURO: alert & oriented x 3 with fluent speech, no focal motor/sensory deficits  Port-A-Cath dressing without drainage  LABORATORY DATA:  I have reviewed the data as listed CMP Latest Ref Rng & Units 08/18/2019 08/17/2019 08/16/2019  Glucose 70 - 99 mg/dL 112(H) 192(H) 125(H)  BUN 8 - 23 mg/dL 19 17 21   Creatinine 0.44 - 1.00 mg/dL 1.23(H) 1.34(H) 1.50(H)  Sodium 135 - 145 mmol/L 132(L) 136 138  Potassium 3.5 - 5.1 mmol/L 4.2 3.6 4.3  Chloride 98 - 111 mmol/L 103 106 106  CO2 22 - 32 mmol/L 20(L) 21(L) 22  Calcium 8.9 - 10.3 mg/dL 7.4(L) 7.8(L) 7.8(L)  Total Protein 6.5 - 8.1 g/dL - - -  Total Bilirubin 0.3 - 1.2 mg/dL - - -  Alkaline Phos 38 - 126 U/L - - -  AST 15 - 41 U/L - - -  ALT 0 - 44 U/L - - -    Lab Results  Component Value Date   WBC 16.6 (H) 08/18/2019   HGB 10.7 (L) 08/18/2019   HCT 35.8 (L) 08/18/2019   MCV 88.6 08/18/2019   PLT 255 08/18/2019   NEUTROABS 13.7 (H) 08/17/2019    Ct Abdomen Pelvis Wo Contrast  Result Date: 08/13/2019 CLINICAL DATA:  Abdominal distension.  History of colon cancer. EXAM: CT ABDOMEN AND PELVIS WITHOUT CONTRAST TECHNIQUE: Multidetector CT imaging of the abdomen and pelvis was performed following the standard protocol without IV contrast. COMPARISON:  02/09/2018 FINDINGS: Lower chest: Lung bases are normal. Hepatobiliary: There are multiple new ill-defined hypodense liver masses with significant nodular enlargement of the entire left lobe of the liver. Findings suggest metastatic disease in this patient with known history of colon cancer. Biliary tree is normal. Cholelithiasis is present. Pancreas: Normal. Spleen: Normal.  Adrenals/Urinary Tract: Adrenal glands demonstrates small stable bilateral nodules. Kidneys are normal in size without hydronephrosis or nephrolithiasis. Ureters and bladder are normal. Stomach/Bowel: Stomach and small bowel are normal. Mild diverticulosis of the colon. Findings suggest previous partial right  colectomy. Vascular/Lymphatic: Mild calcified plaque over the abdominal aorta which is normal in caliber. No significant adenopathy. Reproductive: Possible small right-sided fibroid over the fundus unchanged. Ovaries unremarkable. Other: Multiple peritoneal masses over the left mid abdomen with the largest measuring 3.1 cm compatible with metastatic disease. Musculoskeletal: Degenerative change of the spine with significant disc disease at the L4-5 level. IMPRESSION: 1.  No acute findings in the abdomen/pelvis. 2. Interval development of multiple ill-defined hypodense liver masses with significant nodular enlargement of the entire left lobe of the liver suggesting metastatic disease in this patient with history of colon cancer. Multiple new peritoneal masses compatible with metastatic disease. 3.  Aortic Atherosclerosis (ICD10-I70.0). 4.  Colonic diverticulosis. Electronically Signed   By: Marin Olp M.D.   On: 08/13/2019 07:55   Ct Biopsy  Result Date: 08/15/2019 CLINICAL DATA:  History of colon cancer in 2019 now presenting with multiple hepatic masses as well as a left lower quadrant peritoneal mass. EXAM: CT GUIDED CORE BIOPSY OF PERITONEAL ABDOMINAL MASS ANESTHESIA/SEDATION: 2.0 mg IV Versed; 100 mcg IV Fentanyl Total Moderate Sedation Time:  10 minutes. The patient's level of consciousness and physiologic status were continuously monitored during the procedure by Radiology nursing. PROCEDURE: The procedure risks, benefits, and alternatives were explained to the patient. Questions regarding the procedure were encouraged and answered. The patient understands and consents to the procedure. CT was performed through the lower abdomen and upper pelvis in a supine position. The left abdominal wall was prepped with chlorhexidine in a sterile fashion, and a sterile drape was applied covering the operative field. A sterile gown and sterile gloves were used for the procedure. Local anesthesia was provided with 1%  Lidocaine. A 17 gauge trocar needle was advanced under CT guidance to the level of a peritoneal abdominal mass. After confirming needle tip position, coaxial 18 gauge core biopsy samples were obtained. Three samples were obtained and submitted in formalin. COMPLICATIONS: None FINDINGS: Lobulated soft tissue mass of the peritoneal cavity just deep to the left abdominal wall in the left lower quadrant measures approximately 3.1 x 3.7 cm. Solid tissue was obtained. IMPRESSION: CT-guided core biopsy performed of left peritoneal abdominal mass. Electronically Signed   By: Aletta Edouard M.D.   On: 08/15/2019 17:30   Dg Chest Portable 1 View  Result Date: 08/13/2019 CLINICAL DATA:  Cough EXAM: PORTABLE CHEST 1 VIEW COMPARISON:  Feb 12, 2018 FINDINGS: There is mild cardiomegaly. Aortic knob calcifications are seen. Both lungs are clear. The visualized skeletal structures are unremarkable. IMPRESSION: No active disease. Electronically Signed   By: Prudencio Pair M.D.   On: 08/13/2019 03:44   Ir Imaging Guided Port Insertion  Result Date: 08/16/2019 INDICATION: History of metastatic colon cancer. In need of durable intravenous access for chemotherapy administration EXAM: IMPLANTED PORT A CATH PLACEMENT WITH ULTRASOUND AND FLUOROSCOPIC GUIDANCE COMPARISON:  Chest CT-02/06/2018 MEDICATIONS: Ancef 3 gm IV; The antibiotic was administered within an appropriate time interval prior to skin puncture. ANESTHESIA/SEDATION: Moderate (conscious) sedation was employed during this procedure. A total of Versed 2 mg and Fentanyl 100 mcg was administered intravenously. Moderate Sedation Time: 28 minutes. The patient's level of consciousness and vital signs were monitored continuously by radiology nursing throughout the procedure under my direct supervision. CONTRAST:  None  FLUOROSCOPY TIME:  54 seconds (43 mGy) COMPLICATIONS: None immediate. PROCEDURE: The procedure, risks, benefits, and alternatives were explained to the patient.  Questions regarding the procedure were encouraged and answered. The patient understands and consents to the procedure. The right neck and chest were prepped with chlorhexidine in a sterile fashion, and a sterile drape was applied covering the operative field. Maximum barrier sterile technique with sterile gowns and gloves were used for the procedure. A timeout was performed prior to the initiation of the procedure. Local anesthesia was provided with 1% lidocaine with epinephrine. After creating a small venotomy incision, a micropuncture kit was utilized to access the internal jugular vein. Real-time ultrasound guidance was utilized for vascular access including the acquisition of a permanent ultrasound image documenting patency of the accessed vessel. The microwire was utilized to measure appropriate catheter length. A subcutaneous port pocket was then created along the upper chest wall utilizing a combination of sharp and blunt dissection. The pocket was irrigated with sterile saline. A single lumen standard sized power injectable port was chosen for placement. The 8 Fr catheter was tunneled from the port pocket site to the venotomy incision. The port was placed in the pocket. The external catheter was trimmed to appropriate length. At the venotomy, an 8 Fr peel-away sheath was placed over a guidewire under fluoroscopic guidance. The catheter was then placed through the sheath and the sheath was removed. Final catheter positioning was confirmed and documented with a fluoroscopic spot radiograph. The port was accessed with a Huber needle, aspirated and flushed with heparinized saline. The venotomy site was closed with an interrupted 4-0 Vicryl suture. The port pocket incision was closed with interrupted 2-0 Vicryl suture and the skin was opposed with a running subcuticular 4-0 Vicryl suture. Dermabond and Steri-strips were applied to both incisions. Dressings were placed. The patient tolerated the procedure well  without immediate post procedural complication. FINDINGS: After catheter placement, the tip lies within the superior cavoatrial junction. The catheter aspirates and flushes normally and is ready for immediate use. IMPRESSION: Successful placement of a right internal jugular approach power injectable Port-A-Cath. The catheter is ready for immediate use. Electronically Signed   By: Sandi Mariscal M.D.   On: 08/16/2019 17:28    ASSESSMENT AND PLAN: 1. 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), right colectomy 04/22/2018  Ascending colon, moderately differentiated mucinous adenocarcinoma, no lymphovascular perineural invasion, no tumor deposits, tumor budding-low, 0/14 lymph nodes, tumor invades into the subserosa and is focally present at the inked serosal surface (pT4), no loss of mismatch repair protein expression  Colonoscopy 06/13/2017-ascending colon mass-adenocarcinoma, polyps in the ascending and sigmoid-tubular adenomas  CT abdomen/pelvis 08/13/2019-multiple peritoneal masses compatible with metastatic disease, multiple hypodense liver lesions consistent with metastatic disease  CEA 08/13/2019 -806.0  CT biopsy of a peritoneal mass 08/15/2019, metastatic adenocarcinoma consistent with colon cancer  Cycle 1 FOLFOX 08/18/2019  2.   Acute pulmonary embolism and left popliteal DVT 02/05/2018 3.   Iron deficiency anemia September 2018 4.  Atrial fibrillation 5.  Congestive heart failure 6.  Hypertension 7.  Morbid obesity 8.  Renal failure  Ms. Aquilino appears stable.  Liver biopsy has confirmed metastatic colon adenocarcinoma.  She will begin her first cycle of chemotherapy today.  1.  The patient will proceed with cycle #1 of FOLFOX today as scheduled.  Potential toxicities associated with this regimen have been discussed including nausea, vomiting, mucositis, diarrhea, alopecia, and myelosuppression.  She could also experience rash, sun sensitivity, hyperpigmentation, and hand-foot syndrome associated  with  5-FU.  She could also develop neuropathy and potential allergic reaction associate with oxaliplatin.  She is agreeable to proceeding. 2.  Continue to monitor daily CBC and BMET. 3.  PT/OT to follow-up with the patient today.  I have spoken with OT and PT who plan to recommend skilled nursing facility for rehabilitation upon discharge.   LOS: 5 days   Mikey Bussing, DNP, AGPCNP-BC, AOCNP 08/18/19 Ms. Speltz was interviewed and examined.  The plan is to begin FOLFOX chemotherapy today.  We reviewed potential toxicities associated with FOLFOX again today.  This included discussion of the potential for cardiac toxicity.  She agrees to proceed.  We will request the peritoneal biopsy tissue be submitted for Foundation 1 testing.

## 2019-08-18 NOTE — Progress Notes (Signed)
PT Cancellation Note  Patient Details Name: Amber Medina MRN: [id number] DOB: 1951-09-12   Cancelled Treatment:    Reason Eval/Treat Not Completed: Pain limiting ability to participate(pt reports she's not wanting to move right now, she's trying to get pain at new port site to settle. Will follow.)   Philomena Doheny PT 08/18/2019  Acute Rehabilitation Services Pager [phone] Office [phone]

## 2019-08-18 NOTE — Progress Notes (Signed)
Per [name]: Needs L/flush/OV and 2nd cycle of FOLFOX on 08/31/19. Scheduling message sent and managed care notified to work on [place].

## 2019-08-18 NOTE — Evaluation (Signed)
Occupational Therapy Evaluation Patient Details Name: Amber Medina MRN: [id number] DOB: [birth year] Today's Date: 08/18/2019    History of Present Illness 68 yo female admitted with Afib. Hx of PE, A fib, morbid obesity, anemia, CHF, CKD.  'found to have recurrent colon CA   Clinical Impression   Pt was admitted for the above. She was struggling to do the absolute necessities at home and reports that she wasn't able to clean kitty litter boxes.  She had groceries delivered and did what she could.  She fatiques easily and needs extensive rest breaks.  Pt needs up to total A for LB adls and hygiene. She performed SPT with min guard A. Will follow with min A to min guard level goals, emphasizing energy conservation. Pt does not have good local support. Recommend SNF for rehab upon d/c    Follow Up Recommendations  SNF    Equipment Recommendations  3 in 1 bedside commode(likely wide)    Recommendations for Other Services       Precautions / Restrictions Precautions Precautions: Fall Restrictions Weight Bearing Restrictions: No      Mobility Bed Mobility               General bed mobility comments: min A for back to bed for legs  Transfers   Equipment used: None Transfers: Sit to/from [facility] Sit to Stand: Min guard Stand pivot transfers: Min guard(modified SPT)       General transfer comment: for safety. Pt held to bed.  3:1 was positioned 180 degrees from bed by nursing    Balance                                           ADL either performed or assessed with clinical judgement   ADL Overall ADL's : Needs assistance/impaired Eating/Feeding: Independent   Grooming: Set up   Upper Body Bathing: Moderate assistance   Lower Body Bathing: Total assistance   Upper Body Dressing : Minimal assistance   Lower Body Dressing: Total assistance   Toilet Transfer: Minimal assistance;Stand-pivot;BSC;RW   Toileting- Clothing  Manipulation and Hygiene: Total assistance         General ADL Comments: pt used commode:  total A for hygiene.  Limited by endurance:  extra time for recovery.  Educated on energy conservation:  would benefit from [place]      Pertinent Vitals/Pain Pain Assessment: Faces Faces Pain Scale: Hurts a little bit Pain Location: port Pain Descriptors / Indicators: Sore Pain Intervention(s): Limited activity within patient's tolerance;Monitored during session;Repositioned     Hand Dominance     Extremity/Trunk Assessment Upper Extremity Assessment RUE Deficits / Details: limited by pain from new port placement           Communication Communication Communication: No difficulties   Cognition Arousal/Alertness: Awake/alert Behavior During Therapy: WFL for tasks assessed/performed Overall Cognitive Status: Within Functional Limits for tasks assessed                                     General Comments       Exercises     Shoulder Instructions      Home Living Family/patient expects to be discharged to::  Private residence Living Arrangements: Alone                     Bathroom Toilet: [place]: Environmental consultant - 4 wheels;Cane - single point   Additional Comments: wasn't able to get into shower      Prior Functioning/Environment          Comments: furniture walked.  Has been barely able to take care of herself with groceries delivered. Hasn't been able to do much for cats        OT Problem List: Decreased strength;Decreased activity tolerance;Impaired balance (sitting and/or standing);Decreased knowledge of use of DME or AE;Pain;Obesity      OT Treatment/Interventions: Self-care/ADL training;Energy conservation;DME and/or AE instruction;Therapeutic activities;Patient/family education;Balance training    OT Goals(Current goals can be found in the care plan section) Acute Rehab OT  Goals Patient Stated Goal: home soon. regain strength. OT Goal Formulation: With patient Time For Goal Achievement: 09/01/19 Potential to Achieve Goals: Good ADL Goals Pt Will Perform Lower Body Bathing: with min assist;with adaptive equipment;sit to/from stand Pt Will Perform Lower Body Dressing: with mod assist;sit to/from stand;with adaptive equipment Pt Will Transfer to Toilet: with min guard assist;ambulating;bedside commode Pt Will Perform Toileting - Clothing Manipulation and hygiene: with min assist;with adaptive equipment;sit to/from stand Additional ADL Goal #1: pt will verbalize 3 energy conservation strategies  OT Frequency: Min 2X/week   Barriers to D/C:            Co-evaluation              AM-PAC OT "6 Clicks" Daily Activity     Outcome Measure Help from another person eating meals?: None Help from another person taking care of personal grooming?: A Little Help from another person toileting, which includes using toliet, bedpan, or urinal?: Total Help from another person bathing (including washing, rinsing, drying)?: A Lot Help from another person to put on and taking off regular upper body clothing?: A Little Help from another person to put on and taking off regular lower body clothing?: Total 6 Click Score: 14   End of Session    Activity Tolerance: Patient limited by fatigue Patient left: in bed;with call bell/phone within reach  OT Visit Diagnosis: Unsteadiness on feet (R26.81);Muscle weakness (generalized) (M62.81)                Time: [id number] OT Time Calculation (min): 34 min Charges:  OT General Charges $OT Visit: 1 Visit OT Evaluation $OT Eval Moderate Complexity: 1 Mod OT Treatments $Self Care/Home Management : 8-22 mins  Lesle Chris, OTR/L Acute Rehabilitation Services [phone] [facility] pager [phone] office 08/18/2019  Amber Medina 08/18/2019, 10:09 AM

## 2019-08-18 NOTE — Progress Notes (Signed)
PT Cancellation Note  Patient Details Name: Amber Medina MRN: [id number] DOB: 1950/10/19   Cancelled Treatment:    Reason Eval/Treat Not Completed: Medical issues which prohibited therapy(pt declined PT due to chemo running. Will follow.)  Philomena Doheny PT 08/18/2019  Acute Rehabilitation Services Pager [phone] Office [phone]

## 2019-08-18 NOTE — Progress Notes (Signed)
Calculation of BSA and dosing of oxaliplatin . fluorouracil and leucovorin verified by Aldean Baker ,RN

## 2019-08-19 ENCOUNTER — Telehealth: Payer: Self-pay | Admitting: Oncology

## 2019-08-19 ENCOUNTER — Encounter: Payer: Self-pay | Admitting: *Deleted

## 2019-08-19 DIAGNOSIS — F419 Anxiety disorder, unspecified: Secondary | ICD-10-CM

## 2019-08-19 LAB — BASIC METABOLIC PANEL
Anion gap: 10 (ref 5–15)
BUN: 26 mg/dL — ABNORMAL HIGH (ref 8–23)
CO2: 21 mmol/L — ABNORMAL LOW (ref 22–32)
Calcium: 7.8 mg/dL — ABNORMAL LOW (ref 8.9–10.3)
Chloride: 106 mmol/L (ref 98–111)
Creatinine, Ser: 1.25 mg/dL — ABNORMAL HIGH (ref 0.44–1.00)
GFR calc Af Amer: 50 mL/min — ABNORMAL LOW (ref >60)
GFR calc non Af Amer: 44 mL/min — ABNORMAL LOW (ref >60)
Glucose, Bld: 219 mg/dL — ABNORMAL HIGH (ref 70–99)
Potassium: 4.3 mmol/L (ref 3.5–5.1)
Sodium: 137 mmol/L (ref 135–145)

## 2019-08-19 LAB — CBC
HCT: 34.3 % — ABNORMAL LOW (ref 36.0–46.0)
Hemoglobin: 10.1 g/dL — ABNORMAL LOW (ref 12.0–15.0)
MCH: 26.7 pg (ref 26.0–34.0)
MCHC: 29.4 g/dL — ABNORMAL LOW (ref 30.0–36.0)
MCV: 90.7 fL (ref 80.0–100.0)
Platelets: 277 10*3/uL (ref 150–400)
RBC: 3.78 MIL/uL — ABNORMAL LOW (ref 3.87–5.11)
RDW: 16.9 % — ABNORMAL HIGH (ref 11.5–15.5)
WBC: 22.8 10*3/uL — ABNORMAL HIGH (ref 4.0–10.5)
nRBC: 0 % (ref 0.0–0.2)

## 2019-08-19 LAB — MAGNESIUM: Magnesium: 1.5 mg/dL — ABNORMAL LOW (ref 1.7–2.4)

## 2019-08-19 MED ORDER — MAGNESIUM SULFATE 2 GM/50ML IV SOLN
2.0000 g | Freq: Once | INTRAVENOUS | Status: AC
  Administered 2019-08-19: 2 g via INTRAVENOUS
  Filled 2019-08-19: qty 50

## 2019-08-19 MED ORDER — LORAZEPAM 0.5 MG PO TABS
0.5000 mg | ORAL_TABLET | Freq: Four times a day (QID) | ORAL | Status: DC | PRN
  Administered 2019-08-19 – 2019-08-21 (×2): 0.5 mg via ORAL
  Filled 2019-08-19 (×2): qty 1

## 2019-08-19 NOTE — Progress Notes (Addendum)
HEMATOLOGY-ONCOLOGY PROGRESS NOTE  SUBJECTIVE: Did not sleep well last night reports that she feels anxious and has mild tremors at time.  She denies mucositis.  Abdominal discomfort improved this morning.  Denies nausea and vomiting.  No diarrhea.  Denies peripheral neuropathy.  REVIEW OF SYSTEMS:   Constitutional: Denies fevers, chills Respiratory: Denies cough, dyspnea or wheezes Cardiovascular: Denies palpitation, chest discomfort Gastrointestinal: Abdominal discomfort improved.  Denies nausea, heartburn or change in bowel habits. Skin: Denies abnormal skin rashes Lymphatics: Denies new lymphadenopathy or easy bruising Neurological:Denies numbness, tingling or new weaknesses Behavioral/Psych: Did not sleep well last night.  Feels anxious this morning. Extremities: No lower extremity edema All other systems were reviewed with the patient and are negative.  I have reviewed the past medical history, past surgical history, social history and family history with the patient and they are unchanged from previous note.   PHYSICAL EXAMINATION:  Vitals:   08/18/19 2214 08/19/19 0517  BP: (!) 145/90 136/79  Pulse: 69 68  Resp: 20 15  Temp: 98.3 F (36.8 C) 97.9 F (36.6 C)  SpO2: 96% 95%   Filed Weights   08/13/19 0156 08/19/19 0600  Weight: (!) 314 lb (142.4 kg) (!) 316 lb 2.2 oz (143.4 kg)    Intake/Output from previous day: 11/12 0701 - 11/13 0700 In: 1471.7 [P.O.:1200; IV Piggyback:271.7] Out: 2 [Urine:1; Stool:1]  GENERAL:alert, no distress and comfortable SKIN: skin color, texture, turgor are normal, no rashes or significant lesions EYES: normal, Conjunctiva are pink and non-injected, sclera clear OROPHARYNX:no exudate, no erythema and lips, buccal mucosa, and tongue normal  NECK: supple, thyroid normal size, non-tender, without nodularity LYMPH:  no palpable lymphadenopathy in the cervical, axillary or inguinal LUNGS: clear to auscultation and percussion with normal  breathing effort HEART: regular rate & rhythm and no murmurs and no lower extremity edema ABDOMEN: Positive bowel sounds, soft, nontender, hepatomegaly noted Musculoskeletal:no cyanosis of digits and no clubbing  NEURO: alert & oriented x 3 with fluent speech, no focal motor/sensory deficits  Port-A-Cath dressing without drainage  LABORATORY DATA:  I have reviewed the data as listed CMP Latest Ref Rng & Units 08/19/2019 08/18/2019 08/17/2019  Glucose 70 - 99 mg/dL 219(H) 112(H) 192(H)  BUN 8 - 23 mg/dL 26(H) 19 17  Creatinine 0.44 - 1.00 mg/dL 1.25(H) 1.23(H) 1.34(H)  Sodium 135 - 145 mmol/L 137 132(L) 136  Potassium 3.5 - 5.1 mmol/L 4.3 4.2 3.6  Chloride 98 - 111 mmol/L 106 103 106  CO2 22 - 32 mmol/L 21(L) 20(L) 21(L)  Calcium 8.9 - 10.3 mg/dL 7.8(L) 7.4(L) 7.8(L)  Total Protein 6.5 - 8.1 g/dL - - -  Total Bilirubin 0.3 - 1.2 mg/dL - - -  Alkaline Phos 38 - 126 U/L - - -  AST 15 - 41 U/L - - -  ALT 0 - 44 U/L - - -    Lab Results  Component Value Date   WBC 22.8 (H) 08/19/2019   HGB 10.1 (L) 08/19/2019   HCT 34.3 (L) 08/19/2019   MCV 90.7 08/19/2019   PLT 277 08/19/2019   NEUTROABS 13.7 (H) 08/17/2019    Ct Abdomen Pelvis Wo Contrast  Result Date: 08/13/2019 CLINICAL DATA:  Abdominal distension.  History of colon cancer. EXAM: CT ABDOMEN AND PELVIS WITHOUT CONTRAST TECHNIQUE: Multidetector CT imaging of the abdomen and pelvis was performed following the standard protocol without IV contrast. COMPARISON:  02/09/2018 FINDINGS: Lower chest: Lung bases are normal. Hepatobiliary: There are multiple new ill-defined hypodense liver masses with  significant nodular enlargement of the entire left lobe of the liver. Findings suggest metastatic disease in this patient with known history of colon cancer. Biliary tree is normal. Cholelithiasis is present. Pancreas: Normal. Spleen: Normal. Adrenals/Urinary Tract: Adrenal glands demonstrates small stable bilateral nodules. Kidneys are normal  in size without hydronephrosis or nephrolithiasis. Ureters and bladder are normal. Stomach/Bowel: Stomach and small bowel are normal. Mild diverticulosis of the colon. Findings suggest previous partial right colectomy. Vascular/Lymphatic: Mild calcified plaque over the abdominal aorta which is normal in caliber. No significant adenopathy. Reproductive: Possible small right-sided fibroid over the fundus unchanged. Ovaries unremarkable. Other: Multiple peritoneal masses over the left mid abdomen with the largest measuring 3.1 cm compatible with metastatic disease. Musculoskeletal: Degenerative change of the spine with significant disc disease at the L4-5 level. IMPRESSION: 1.  No acute findings in the abdomen/pelvis. 2. Interval development of multiple ill-defined hypodense liver masses with significant nodular enlargement of the entire left lobe of the liver suggesting metastatic disease in this patient with history of colon cancer. Multiple new peritoneal masses compatible with metastatic disease. 3.  Aortic Atherosclerosis (ICD10-I70.0). 4.  Colonic diverticulosis. Electronically Signed   By: Marin Olp M.D.   On: 08/13/2019 07:55   Ct Biopsy  Result Date: 08/15/2019 CLINICAL DATA:  History of colon cancer in 2019 now presenting with multiple hepatic masses as well as a left lower quadrant peritoneal mass. EXAM: CT GUIDED CORE BIOPSY OF PERITONEAL ABDOMINAL MASS ANESTHESIA/SEDATION: 2.0 mg IV Versed; 100 mcg IV Fentanyl Total Moderate Sedation Time:  10 minutes. The patient's level of consciousness and physiologic status were continuously monitored during the procedure by Radiology nursing. PROCEDURE: The procedure risks, benefits, and alternatives were explained to the patient. Questions regarding the procedure were encouraged and answered. The patient understands and consents to the procedure. CT was performed through the lower abdomen and upper pelvis in a supine position. The left abdominal wall was  prepped with chlorhexidine in a sterile fashion, and a sterile drape was applied covering the operative field. A sterile gown and sterile gloves were used for the procedure. Local anesthesia was provided with 1% Lidocaine. A 17 gauge trocar needle was advanced under CT guidance to the level of a peritoneal abdominal mass. After confirming needle tip position, coaxial 18 gauge core biopsy samples were obtained. Three samples were obtained and submitted in formalin. COMPLICATIONS: None FINDINGS: Lobulated soft tissue mass of the peritoneal cavity just deep to the left abdominal wall in the left lower quadrant measures approximately 3.1 x 3.7 cm. Solid tissue was obtained. IMPRESSION: CT-guided core biopsy performed of left peritoneal abdominal mass. Electronically Signed   By: Aletta Edouard M.D.   On: 08/15/2019 17:30   Dg Chest Portable 1 View  Result Date: 08/13/2019 CLINICAL DATA:  Cough EXAM: PORTABLE CHEST 1 VIEW COMPARISON:  Feb 12, 2018 FINDINGS: There is mild cardiomegaly. Aortic knob calcifications are seen. Both lungs are clear. The visualized skeletal structures are unremarkable. IMPRESSION: No active disease. Electronically Signed   By: Prudencio Pair M.D.   On: 08/13/2019 03:44   Ir Imaging Guided Port Insertion  Result Date: 08/16/2019 INDICATION: History of metastatic colon cancer. In need of durable intravenous access for chemotherapy administration EXAM: IMPLANTED PORT A CATH PLACEMENT WITH ULTRASOUND AND FLUOROSCOPIC GUIDANCE COMPARISON:  Chest CT-02/06/2018 MEDICATIONS: Ancef 3 gm IV; The antibiotic was administered within an appropriate time interval prior to skin puncture. ANESTHESIA/SEDATION: Moderate (conscious) sedation was employed during this procedure. A total of Versed 2 mg and  Fentanyl 100 mcg was administered intravenously. Moderate Sedation Time: 28 minutes. The patient's level of consciousness and vital signs were monitored continuously by radiology nursing throughout the  procedure under my direct supervision. CONTRAST:  None FLUOROSCOPY TIME:  54 seconds (43 mGy) COMPLICATIONS: None immediate. PROCEDURE: The procedure, risks, benefits, and alternatives were explained to the patient. Questions regarding the procedure were encouraged and answered. The patient understands and consents to the procedure. The right neck and chest were prepped with chlorhexidine in a sterile fashion, and a sterile drape was applied covering the operative field. Maximum barrier sterile technique with sterile gowns and gloves were used for the procedure. A timeout was performed prior to the initiation of the procedure. Local anesthesia was provided with 1% lidocaine with epinephrine. After creating a small venotomy incision, a micropuncture kit was utilized to access the internal jugular vein. Real-time ultrasound guidance was utilized for vascular access including the acquisition of a permanent ultrasound image documenting patency of the accessed vessel. The microwire was utilized to measure appropriate catheter length. A subcutaneous port pocket was then created along the upper chest wall utilizing a combination of sharp and blunt dissection. The pocket was irrigated with sterile saline. A single lumen standard sized power injectable port was chosen for placement. The 8 Fr catheter was tunneled from the port pocket site to the venotomy incision. The port was placed in the pocket. The external catheter was trimmed to appropriate length. At the venotomy, an 8 Fr peel-away sheath was placed over a guidewire under fluoroscopic guidance. The catheter was then placed through the sheath and the sheath was removed. Final catheter positioning was confirmed and documented with a fluoroscopic spot radiograph. The port was accessed with a Huber needle, aspirated and flushed with heparinized saline. The venotomy site was closed with an interrupted 4-0 Vicryl suture. The port pocket incision was closed with interrupted  2-0 Vicryl suture and the skin was opposed with a running subcuticular 4-0 Vicryl suture. Dermabond and Steri-strips were applied to both incisions. Dressings were placed. The patient tolerated the procedure well without immediate post procedural complication. FINDINGS: After catheter placement, the tip lies within the superior cavoatrial junction. The catheter aspirates and flushes normally and is ready for immediate use. IMPRESSION: Successful placement of a right internal jugular approach power injectable Port-A-Cath. The catheter is ready for immediate use. Electronically Signed   By: Sandi Mariscal M.D.   On: 08/16/2019 17:28    ASSESSMENT AND PLAN: 1. 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), right colectomy 04/22/2018  Ascending colon, moderately differentiated mucinous adenocarcinoma, no lymphovascular perineural invasion, no tumor deposits, tumor budding-low, 0/14 lymph nodes, tumor invades into the subserosa and is focally present at the inked serosal surface (pT4), no loss of mismatch repair protein expression  Colonoscopy 06/13/2017-ascending colon mass-adenocarcinoma, polyps in the ascending and sigmoid-tubular adenomas  CT abdomen/pelvis 08/13/2019-multiple peritoneal masses compatible with metastatic disease, multiple hypodense liver lesions consistent with metastatic disease  CEA 08/13/2019 -806.0  CT biopsy of a peritoneal mass 08/15/2019, metastatic adenocarcinoma consistent with colon cancer  Cycle 1 FOLFOX 08/18/2019  2.   Acute pulmonary embolism and left popliteal DVT 02/05/2018 3.   Iron deficiency anemia September 2018 4.  Atrial fibrillation 5.  Congestive heart failure 6.  Hypertension 7.  Morbid obesity 8.  Renal failure  Ms. Moss appears stable.  Liver biopsy has confirmed metastatic colon adenocarcinoma.  Started first cycle of FOLFOX 08/18/2019.  Tolerated well.  She has developed leukocytosis with difficulty sleeping, and anxiety secondary  to steroids.  She is being evaluated by  PT/OT for SNF placement for rehabilitation.  1.  Continue 5-FU infusional pump as ordered. 2.  Continue to monitor daily CBC and BMET. 3.  Discussed with patient that she needs to work with PT today as she has been declining their visits.  She understands that this evaluation needs to be completed in order for any recommendations to proceed with SNF placement. 4.  Lorazepam 0.5 mg p.o. every 6 hours as needed ordered for anxiety/insomnia. 5.  I have placed a social work consult at the cancer center for outpatient follow-up.   LOS: 6 days   Mikey Bussing, DNP, AGPCNP-BC, AOCNP 08/19/19  Ms. Toto was interviewed and examined.  She tolerated day 1 chemotherapy well.  She is completing the day 2 and day three 5-FU infusion.  She will complete cycle 1 FOLFOX tomorrow.  Outpatient follow-up scheduled at the Cancer center for cycle 2 chemotherapy.  She will likely need skilled nursing facility placement.  Please call oncology as needed over the weekend.  I will check on her 08/22/2019 if she remains in the hospital.

## 2019-08-19 NOTE — Progress Notes (Signed)
At request of [name], sent email to [facility]ology requesting [facility] testing on case # [id number] dated 08/15/19. Dx: C18.9 Stage IV

## 2019-08-19 NOTE — TOC Initial Note (Addendum)
Transition of Care [facility]) - Initial/Assessment Note    Patient Details  Name: Amber Medina MRN: [id number] Date of Birth: June 16, 1951  [facility]) CM/SW Contact:    Steele Stracener, Marjie Skiff, RN Phone Number:[phone] 08/19/2019, 3:03 PM  Clinical Narrative:                 Pt faxed out for SNF as she requested. Pt given only bed offer of [facility]. Pt accepted bed offer. [facility] to start authorization with insurance.  Expected Discharge Plan: Skilled Nursing Facility Barriers to Discharge: Continued Medical Work up   Patient Goals and CMS Choice Patient states their goals for this hospitalization and ongoing recovery are:: To get stronger to get home. CMS Medicare.gov Compare Post Acute Care list provided to:: Patient Choice offered to / list presented to : Patient  Expected Discharge Plan and Services Expected Discharge Plan: [place] Acute Care Choice: Nursing Home Living arrangements for the past 2 months: Single Family Home                                      Prior Living Arrangements/Services Living arrangements for the past 2 months: Single Family Home Lives with:: Self                   Activities of Daily Living Home Assistive Devices/Equipment: Environmental consultant (specify type) ADL Screening (condition at time of admission) Patient's cognitive ability adequate to safely complete daily activities?: Yes Is the patient deaf or have difficulty hearing?: No Does the patient have difficulty seeing, even when wearing glasses/contacts?: No Does the patient have difficulty concentrating, remembering, or making decisions?: No Patient able to express need for assistance with ADLs?: No Does the patient have difficulty dressing or bathing?: Yes Independently performs ADLs?: No Communication: Independent Dressing (OT): Dependent, Needs assistance Is this a change from baseline?: Pre-admission baseline Grooming: Dependent, Needs  assistance Is this a change from baseline?: Pre-admission baseline Feeding: Independent Bathing: Dependent, Needs assistance Is this a change from baseline?: Pre-admission baseline Toileting: Dependent, Needs assistance Is this a change from baseline?: Pre-admission baseline In/Out Bed: Dependent, Needs assistance Is this a change from baseline?: Pre-admission baseline Walks in Home: Dependent, Needs assistance Is this a change from baseline?: Pre-admission baseline Does the patient have difficulty walking or climbing stairs?: Yes Weakness of Legs: Both Weakness of Arms/Hands: Both  Permission Sought/Granted                  Emotional Assessment              Admission diagnosis:  Dehydration [E86.0] Generalized weakness [R53.1] 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 Colon cancer metastasized to liver ([place]) 08/17/2019  . Goals of care, counseling/discussion 08/17/2019  . Liver lesion 08/13/2019  . Abdominal distension 08/13/2019  . Acute pulmonary embolism with acute cor pulmonale ([place]) 02/05/2018  . Adenocarcinoma of colon ([place]) 02/05/2018  . Colonic mass 06/15/2017  . Anemia 06/11/2017  . Chronic diastolic (congestive) heart failure ([place]) 10/11/2015  . Persistent atrial fibrillation ([place]) 10/11/2015  . OSA (obstructive sleep apnea)   . Acute respiratory failure with hypoxia ([place])   . Hyperglycemia 09/06/2015  . Anasarca 09/06/2015  . Obesity, morbid ([place]) 09/06/2015  PCP:  [name], MD Pharmacy:   [facility] [address], [place] AT [place] [address] [place] [address] Phone: [phone] Fax: [phone]     Social Determinants of Health (SDOH) Interventions    Readmission Risk  Interventions No flowsheet data found. 30 Day Unplanned Readmission Risk Score     ED to Hosp-Admission (Current) from 08/13/2019 in [place] [facility]  30 Day Unplanned Readmission Risk Score (%)  27 Filed at 08/22/2019 1201     This score is the patient's risk of an unplanned readmission within 30 days of being discharged (0 -100%). The score is based on dignosis, age, lab data, medications, orders, and past utilization.   Low:  0-14.9   Medium: 15-21.9   High: 22-29.9   Extreme: 30 and above        Readmission Risk Prevention Plan 08/22/2019  Transportation Screening Complete  PCP or Specialist Appt within 3-5 Days Not Complete  Not Complete comments Not ready  [place] or [place] Not Complete  HRI or Home Care Consult comments NA  Social Work Consult for [place] Planning/Counseling Not Complete  SW consult not completed comments NA  Palliative Care Screening Not Applicable  Medication Review Press photographer) Complete  Some recent data might be hidden

## 2019-08-19 NOTE — Progress Notes (Signed)
PROGRESS NOTE  Amber Medina [id number] DOB: 05-Elodia-1952   PCP: [name], MD  Patient is from: home.  Lives alone.  Independently ambulates at baseline.  DOA: 08/13/2019 LOS: 6  Brief Narrative / Interim history: 68 year old female with history of colon cancer status post resection in [place] but did not receive chemotherapy or radiation, A. fib on Xarelto, was admitted to the hospital on 08/13/2019 with generalized weakness, abdominal pain and distention and appetite loss.  CT scan on admission showed multiple liver lesions suspect for metastatic disease as well as peritoneal lesions suspect for carcinomatosis.  Oncology and IR were consulted.  She underwent biopsy of abdominal mass on 08/15/2019.  Pathology confirmed metastatic colon cancer.  Had Port A Cath placed 08/16/2019.  Started chemotherapy on 08/18/2019.  Evaluated by PT/OT who recommended SNF.  Subjective: No major events overnight of this morning.  She is very upset about fluid restriction.  She also states that Lasix gave her leg swelling.  She denies chest pain, shortness of breath, nausea, vomiting, abdominal pain or UTI symptoms.  Objective: Vitals:   08/18/19 1616 08/18/19 2214 08/19/19 0517 08/19/19 0600  BP: 136/84 (!) 145/90 136/79   Pulse: 69 69 68   Resp: 17 20 15    Temp: 98 F (36.7 C) 98.3 F (36.8 C) 97.9 F (36.6 C)   TempSrc: Oral Oral Oral   SpO2: 95% 96% 95%   Weight:    (!) 143.4 kg  Height:        Intake/Output Summary (Last 24 hours) at 08/19/2019 1508 Last data filed at 08/19/2019 0300 Gross per 24 hour  Intake 991.74 ml  Output 2 ml  Net 989.74 ml   Filed Weights   08/13/19 0156 08/19/19 0600  Weight: (!) 142.4 kg (!) 143.4 kg    Examination:  GENERAL: No acute distress.  Appears well.  HEENT: MMM.  Vision and hearing grossly intact.  NECK: Supple.  No apparent JVD.  RESP:  No IWOB. Good air movement bilaterally. CVS:  RRR. Heart sounds normal.  ABD/GI/GU: Bowel sounds  present.  Refused abdominal exam out of concern for pain. MSK/EXT:  Moves extremities. No apparent deformity or edema.  SKIN: no apparent skin lesion or wound NEURO: Awake, alert and oriented appropriately.  No apparent focal neuro deficit. PSYCH: Calm. Normal affect.  Port a cath over right chest  Assessment & Plan: Acute kidney injury on chronic kidney disease stage IIIa: Baseline 1.2 in 02/2018.  Likely due to dehydration/poor p.o. intake.  Improving. -Creatinine 2.8 (admit)>>> 1.23 -Continue monitoring.  -Liberate fluid intake  Colon adenocarcinoma status post resection now with multiple metastasis as noted on CT abdomen and pelvis. -Abdominal biopsy confirms metastasis of colon cancer -Port a cath placed on 08/16/2019. -Ultrasound-guided biopsy of abdominal wall on 08/15/2019 confirmed colon cancer -Started on chemotherapy 11/12. -Oncology managing.  Leukocytosis/bandemia: Unclear source.  Could be related to malignancy.  Afebrile.  Infectious work-up not revealing.  Off antibiotics. Uptrended after chemotherapy. -Continue monitoring with daily CBC  Chronic A. Fib: Rate and rhythm controlled -Continue home amiodarone, Cardizem, metoprolol and Xarelto.  Morbid obesity: BMI 49 -Encourage lifestyle change to lose weight.  History of left popliteal vein DVT/PE -Continue home Xarelto.  Chronic diastolic CHF: Stable.  Echo in 5/20 with EF of 55 to 60%, G1 DD.  No cardiopulmonary symptoms but trace edema.  I and O incomplete in her chart but she had 5 unmeasured voids.  Weight is not reliable.  Upset about fluid restriction -Continue oral  Lasix 20 mg daily -Daily weight, intake output and renal function -Liberate fluid intake.  She understand the risk.   Anemia of chronic disease: Stable.  Anemia panel consistent with anemia of chronic disease -Monitor H&H  Generalized weakness/deconditioning: Lives alone.  Ambulates independently at baseline.  Has not been able to care for  herself recently which is a new change for her. -PT/OT eval-recommended SNF -CSW consulted.                DVT prophylaxis: Xarelto Code Status: Full code Family Communication: Patient and/or RN. Available if any question. Disposition Plan: Remains inpatient pending clinical improvement and clearance by oncology.  Final disposition SNF. Consultants: Oncology  Procedures:  08/15/2019-ultrasound-guided abdominal mass biopsy 08/16/2019-port a cath placement  Microbiology summarized: 11/7-COVID-19 negative 11/9-blood cultures negative  Sch Meds:  Scheduled Meds: . amiodarone  100 mg Oral Daily  . Chlorhexidine Gluconate Cloth  6 each Topical Daily  . diltiazem  240 mg Oral Daily  . FLUOROURACIL (ADRUCIL) CHEMO infusion For Inpatient Use  2,000 mg/m2 (Treatment Plan Recorded) Intravenous Once  . furosemide  20 mg Oral Daily  . metoprolol succinate  25 mg Oral Daily  . rivaroxaban  20 mg Oral Q supper  . sodium chloride flush  10-40 mL Intracatheter Q12H   Continuous Infusions: PRN Meds:.acetaminophen **OR** acetaminophen, HYDROcodone-acetaminophen, iohexol, LORazepam, sodium chloride flush  Antimicrobials: Anti-infectives (From admission, onward)   Start     Dose/Rate Route Frequency Ordered Stop   08/16/19 1230  ceFAZolin (ANCEF) 3 g in dextrose 5 % 50 mL IVPB     3 g 100 mL/hr over 30 Minutes Intravenous To Radiology 08/16/19 1223 08/16/19 1700   08/13/19 1300  cefTRIAXone (ROCEPHIN) 2 g in sodium chloride 0.9 % 100 mL IVPB  Status:  Discontinued     2 g 200 mL/hr over 30 Minutes Intravenous Every 24 hours 08/13/19 1228 08/16/19 1059       I have personally reviewed the following labs and images: CBC: Recent Labs  Lab 08/13/19 0222  08/16/19 0501 08/17/19 1022 08/17/19 1513 08/18/19 0459 08/19/19 0450  WBC 20.5*   < > 16.1* 16.7* 16.3* 16.6* 22.8*  NEUTROABS 17.4*  --   --   --  13.7*  --   --   HGB 12.0   < > 10.1* 10.5* 11.0* 10.7* 10.1*  HCT 40.6   <  > 33.5* 35.2* 36.9 35.8* 34.3*  MCV 89.6   < > 88.4 90.5 89.3 88.6 90.7  PLT 510*   < > 328 313 332 255 277   < > = values in this interval not displayed.   BMP &GFR Recent Labs  Lab 08/15/19 0529 08/16/19 0501 08/17/19 1022 08/18/19 0459 08/19/19 0450  NA 139 138 136 132* 137  K 3.7 4.3 3.6 4.2 4.3  CL 108 106 106 103 106  CO2 19* 22 21* 20* 21*  GLUCOSE 124* 125* 192* 112* 219*  BUN 25* 21 17 19  26*  CREATININE 1.80* 1.50* 1.34* 1.23* 1.25*  CALCIUM 8.1* 7.8* 7.8* 7.4* 7.8*  MG 1.9  --   --  1.5* 1.5*   Estimated Creatinine Clearance: 62.3 mL/min (A) (by C-G formula based on SCr of 1.25 mg/dL (H)). Liver & Pancreas: Recent Labs  Lab 08/13/19 0222  AST 45*  ALT 26  ALKPHOS 256*  BILITOT 1.3*  PROT 7.3  ALBUMIN 2.4*   Recent Labs  Lab 08/13/19 0222  LIPASE 19   No results for input(s): AMMONIA in  the last 168 hours. Diabetic: No results for input(s): HGBA1C in the last 72 hours. No results for input(s): GLUCAP in the last 168 hours. Cardiac Enzymes: No results for input(s): CKTOTAL, CKMB, CKMBINDEX, TROPONINI in the last 168 hours. No results for input(s): PROBNP in the last 8760 hours. Coagulation Profile: Recent Labs  Lab 08/13/19 1305 08/15/19 0529  INR 1.6* 1.6*   Thyroid Function Tests: No results for input(s): TSH, T4TOTAL, FREET4, T3FREE, THYROIDAB in the last 72 hours. Lipid Profile: No results for input(s): CHOL, HDL, LDLCALC, TRIG, CHOLHDL, LDLDIRECT in the last 72 hours. Anemia Panel: Recent Labs    08/18/19 1615  VITAMINB12 722  FOLATE >100.0  FERRITIN 429*  TIBC 114*  IRON 18*  RETICCTPCT 1.2   Urine analysis:    Component Value Date/Time   COLORURINE AMBER (A) 08/13/2019 0222   APPEARANCEUR CLOUDY (A) 08/13/2019 0222   LABSPEC 1.012 08/13/2019 0222   PHURINE 5.0 08/13/2019 0222   GLUCOSEU 50 (A) 08/13/2019 0222   HGBUR SMALL (A) 08/13/2019 0222   BILIRUBINUR NEGATIVE 08/13/2019 0222   KETONESUR NEGATIVE 08/13/2019 0222    PROTEINUR 100 (A) 08/13/2019 0222   NITRITE NEGATIVE 08/13/2019 0222   LEUKOCYTESUR NEGATIVE 08/13/2019 0222   Sepsis Labs: Invalid input(s): PROCALCITONIN, [place]  Microbiology: Recent Results (from the past 240 hour(s))  SARS CORONAVIRUS 2 (TAT 6-24 HRS) Nasopharyngeal Nasopharyngeal Swab     Status: None   Collection Time: 08/13/19  3:11 AM   Specimen: Nasopharyngeal Swab  Result Value Ref Range Status   SARS Coronavirus 2 NEGATIVE NEGATIVE Final    Comment: (NOTE) SARS-CoV-2 target nucleic acids are NOT DETECTED. The SARS-CoV-2 RNA is generally detectable in upper and lower respiratory specimens during the acute phase of infection. Negative results do not preclude SARS-CoV-2 infection, do not rule out co-infections with other pathogens, and should not be used as the sole basis for treatment or other patient management decisions. Negative results must be combined with clinical observations, patient history, and epidemiological information. The expected result is Negative. Fact Sheet for Patients: SugarRoll.be Fact Sheet for Healthcare Providers: https://www.woods-mathews.com/ This test is not yet approved or cleared by the [place] FDA and  has been authorized for detection and/or diagnosis of SARS-CoV-2 by FDA under an Emergency Use Authorization (EUA). This EUA will remain  in effect (meaning this test can be used) for the duration of the COVID-19 declaration under Section 56 4(b)(1) of the Act, 21 U.S.C. section 360bbb-3(b)(1), unless the authorization is terminated or revoked sooner. Performed at [facility], [place] [address] [place], [place] [address]   Blood culture (routine x 2)     Status: None   Collection Time: 08/13/19  8:15 AM   Specimen: BLOOD  Result Value Ref Range Status   Specimen Description   Final    BLOOD BLOOD LEFT FOREARM Performed at [facility] [address],  [place], [place] [address]    Special Requests   Final    BOTTLES DRAWN AEROBIC AND ANAEROBIC Blood Culture adequate volume Performed at [facility] [address], [place], [place] [address]    Culture   Final    NO GROWTH 5 DAYS Performed at [facility], [place] [address], [place], [place] [address]    Report Status 08/18/2019 FINAL  Final    Radiology Studies: No results found.  Taye T. [place]te  If 7PM-7AM, please contact night-coverage [email or web address] Password [email or web address] 08/19/2019, 3:08 PM

## 2019-08-19 NOTE — Telephone Encounter (Signed)
Scheduled appt per 11/12 sch message - pt is aware of appt date and time.  

## 2019-08-19 NOTE — NC FL2 (Signed)
[place] LEVEL OF CARE SCREENING TOOL     IDENTIFICATION  Patient Name: Amber Medina Birthdate: 12-17-1950 Sex: female Admission Date (Current Location): 08/13/2019  [facility] and [place] Number:  Herbalist and Address:  [facility],  [place] [address] [place]      Provider Number: [id number]  Attending Physician Name and Address:  Mercy Riding, MD  Relative Name and Phone Number:       Current Level of Care: Hospital Recommended Level of Care: [place] Prior Approval Number:    Date Approved/Denied:   PASRR Number: [id number]  Discharge Plan: SNF    Current Diagnoses: Patient Active Problem List   Diagnosis Date Noted  . Colon cancer metastasized to liver ([place]) 08/17/2019  . Goals of care, counseling/discussion 08/17/2019  . Liver lesion 08/13/2019  . Abdominal distension 08/13/2019  . Acute pulmonary embolism with acute cor pulmonale ([place]) 02/05/2018  . Adenocarcinoma of colon ([place]) 02/05/2018  . Colonic mass 06/15/2017  . Anemia 06/11/2017  . Chronic diastolic (congestive) heart failure ([place]) 10/11/2015  . Persistent atrial fibrillation ([place]) 10/11/2015  . OSA (obstructive sleep apnea)   . Acute respiratory failure with hypoxia ([place])   . Hyperglycemia 09/06/2015  . Anasarca 09/06/2015  . Obesity, morbid ([place]) 09/06/2015    Orientation RESPIRATION BLADDER Height & Weight     Self, Time, Situation, Place  Normal Continent Weight: (!) 143.4 kg Height:  5\' 7"  (170.2 cm)  BEHAVIORAL SYMPTOMS/MOOD NEUROLOGICAL BOWEL NUTRITION STATUS      Continent Diet  AMBULATORY STATUS COMMUNICATION OF NEEDS Skin   Limited Assist Verbally Normal                       Personal Care Assistance Level of Assistance  Bathing, Dressing Bathing Assistance: Limited assistance   Dressing Assistance: Limited assistance     Functional Limitations Info             SPECIAL CARE FACTORS FREQUENCY  PT (By  licensed PT), OT (By licensed OT)     PT Frequency: 5 x weekly OT Frequency: 5 x weekly            Contractures Contractures Info: Not present    Additional Factors Info  Code Status, Allergies Code Status Info: Full Allergies Info: Compazine           Current Medications (08/19/2019):  This is the current hospital active medication list Current Facility-Administered Medications  Medication Dose Route Frequency Provider Last Rate Last Dose  . acetaminophen (TYLENOL) tablet 650 mg  650 mg Oral Q6H PRN Verlee Monte, MD   650 mg at 08/15/19 2038   Or  . acetaminophen (TYLENOL) suppository 650 mg  650 mg Rectal Q6H PRN Verlee Monte, MD      . amiodarone (PACERONE) tablet 100 mg  100 mg Oral Daily Verlee Monte, MD   100 mg at 08/19/19 1000  . Chlorhexidine Gluconate Cloth 2 % PADS 6 each  6 each Topical Daily Maryanna Shape, NP   6 each at 08/19/19 1000  . diltiazem (CARDIZEM CD) 24 hr capsule 240 mg  240 mg Oral Daily Verlee Monte, MD   240 mg at 08/19/19 0959  . fluorouracil (ADRUCIL) 5,200 mg in sodium chloride 0.9 % 1,000 mL chemo infusion  2,000 mg/m2 (Treatment Plan Recorded) Intravenous Once Ladell Pier, MD 26 mL/hr at 08/19/19 0300    . furosemide (LASIX) tablet 20 mg  20 mg  Oral Daily Mercy Riding, MD   20 mg at 08/19/19 0959  . HYDROcodone-acetaminophen (NORCO/VICODIN) 5-325 MG per tablet 1-2 tablet  1-2 tablet Oral Q4H PRN Verlee Monte, MD      . iohexol (OMNIPAQUE) 300 MG/ML solution 30 mL  30 mL Oral Once PRN Verlee Monte, MD      . LORazepam (ATIVAN) tablet 0.5 mg  0.5 mg Oral Q6H PRN Curcio, Kristin R, NP      . metoprolol succinate (TOPROL-XL) 24 hr tablet 25 mg  25 mg Oral Daily Verlee Monte, MD   25 mg at 08/19/19 0959  . rivaroxaban (XARELTO) tablet 20 mg  20 mg Oral Q supper Lynelle Doctor, RPH   20 mg at 08/18/19 1647  . sodium chloride flush (NS) 0.9 % injection 10-40 mL  10-40 mL Intracatheter Q12H Curcio, Kristin R, NP   10 mL at 08/18/19 2200  .  sodium chloride flush (NS) 0.9 % injection 10-40 mL  10-40 mL Intracatheter PRN Maryanna Shape, NP         Discharge Medications: Please see discharge summary for a list of discharge medications.  Relevant Imaging Results:  Relevant Lab Results:   Additional Information SS# [id number], Next Chemo scheduled for 11/24  Morganna Styles, Marjie Skiff, RN

## 2019-08-19 NOTE — Evaluation (Signed)
Physical Therapy Re-Evaluation Patient Details Name: Amber Medina MRN: [id number] DOB: 1951-10-03 Today's Date: 08/19/2019   History of Present Illness  68 yo female admitted with Afib. Hx of PE, A fib, morbid obesity, anemia, CHF, CKD  Clinical Impression  Re-eval performed per medical team request. Patient received sitting at EOB, reports significant concern regarding her mobility and states she really does not feel comfortable going home alone due to fatigue and weakness. Unable to stand from standard height surface with MaxA, but able to come to full standing position with RW and MinA from elevated surface, then Min guard to pivot to chair, elevated RR and increased fatigue after. Deferred gait due to fatigue but able to take pivotal steps with RW. She was left up in the chair with all needs met, all questions/concerns addressed this morning. Currently recommending SNF due to gross deconditioning and weakness.     Follow Up Recommendations SNF;Supervision/Assistance - 24 hour    Equipment Recommendations  Other (comment)(defer)    Recommendations for Other Services       Precautions / Restrictions Precautions Precautions: Fall Restrictions Weight Bearing Restrictions: No      Mobility  Bed Mobility               General bed mobility comments: at EOB  Transfers Overall transfer level: Needs assistance Equipment used: Rolling walker (2 wheeled) Transfers: Sit to/from [facility] Sit to Stand: Min assist;From elevated surface Stand pivot transfers: Min guard       General transfer comment: MinA to boost up from elevated surface, min guard for safety with RW  Ambulation/Gait             General Gait Details: deferred- fatigue  Stairs            Wheelchair Mobility    Modified [place] (Stroke Patients Only)       Balance Overall balance assessment: Needs assistance Sitting-balance support: Feet supported Sitting balance-Leahy  Scale: Normal     Standing balance support: Bilateral upper extremity supported Standing balance-Leahy Scale: Poor Standing balance comment: reliant on UE support                             Pertinent Vitals/Pain Pain Assessment: No/denies pain Pain Score: 0-No pain Pain Intervention(s): Limited activity within patient's tolerance;Monitored during session    Home Living                        Prior Function                 Hand Dominance        Extremity/Trunk Assessment   Upper Extremity Assessment Upper Extremity Assessment: Defer to OT evaluation    Lower Extremity Assessment Lower Extremity Assessment: Generalized weakness    Cervical / Trunk Assessment Cervical / Trunk Assessment: Normal  Communication      Cognition Arousal/Alertness: Awake/alert Behavior During Therapy: WFL for tasks assessed/performed Overall Cognitive Status: Within Functional Limits for tasks assessed                                        General Comments      Exercises     Assessment/Plan    PT Assessment Patient needs continued PT services  PT Problem List Decreased mobility;Decreased activity tolerance;Decreased balance;Pain;Decreased strength  PT Treatment Interventions DME instruction;Gait training;Therapeutic exercise;Therapeutic activities;Patient/family education;Balance training;Functional mobility training    PT Goals (Current goals can be found in the Care Plan section)  Acute Rehab PT Goals Patient Stated Goal: home soon. regain strength. PT Goal Formulation: With patient Time For Goal Achievement: 09/02/19 Potential to Achieve Goals: Good    Frequency Min 2X/week   Barriers to discharge        Co-evaluation               AM-PAC PT "6 Clicks" Mobility  Outcome Measure Help needed turning from your back to your side while in a flat bed without using bedrails?: A Little Help needed moving from lying  on your back to sitting on the side of a flat bed without using bedrails?: A Little Help needed moving to and from a bed to a chair (including a wheelchair)?: A Lot Help needed standing up from a chair using your arms (e.g., wheelchair or bedside chair)?: A Lot Help needed to walk in hospital room?: A Little Help needed climbing 3-5 steps with a railing? : A Lot 6 Click Score: 15    End of Session   Activity Tolerance: Patient limited by fatigue Patient left: in chair;with call bell/phone within reach Nurse Communication: Mobility status PT Visit Diagnosis: Unsteadiness on feet (R26.81);Muscle weakness (generalized) (M62.81);Pain Pain - part of body: Knee    Time: 0926-0951 PT Time Calculation (min) (ACUTE ONLY): 25 min   Charges:   PT Evaluation $PT Re-evaluation: 1 Re-eval PT Treatments $Therapeutic Activity: 8-22 mins        Windell Norfolk, DPT, CBIS  Supplemental Physical Therapist [place]    Pager [phone] Acute Rehab Office [phone]

## 2019-08-20 DIAGNOSIS — R7989 Other specified abnormal findings of blood chemistry: Secondary | ICD-10-CM

## 2019-08-20 LAB — BASIC METABOLIC PANEL
Anion gap: 7 (ref 5–15)
BUN: 36 mg/dL — ABNORMAL HIGH (ref 8–23)
CO2: 24 mmol/L (ref 22–32)
Calcium: 7.8 mg/dL — ABNORMAL LOW (ref 8.9–10.3)
Chloride: 110 mmol/L (ref 98–111)
Creatinine, Ser: 1.26 mg/dL — ABNORMAL HIGH (ref 0.44–1.00)
GFR calc Af Amer: 50 mL/min — ABNORMAL LOW (ref >60)
GFR calc non Af Amer: 43 mL/min — ABNORMAL LOW (ref >60)
Glucose, Bld: 151 mg/dL — ABNORMAL HIGH (ref 70–99)
Potassium: 5.1 mmol/L (ref 3.5–5.1)
Sodium: 141 mmol/L (ref 135–145)

## 2019-08-20 LAB — CBC
HCT: 34.5 % — ABNORMAL LOW (ref 36.0–46.0)
Hemoglobin: 10.2 g/dL — ABNORMAL LOW (ref 12.0–15.0)
MCH: 26.8 pg (ref 26.0–34.0)
MCHC: 29.6 g/dL — ABNORMAL LOW (ref 30.0–36.0)
MCV: 90.6 fL (ref 80.0–100.0)
Platelets: 318 10*3/uL (ref 150–400)
RBC: 3.81 MIL/uL — ABNORMAL LOW (ref 3.87–5.11)
RDW: 17.1 % — ABNORMAL HIGH (ref 11.5–15.5)
WBC: 17.2 10*3/uL — ABNORMAL HIGH (ref 4.0–10.5)
nRBC: 0 % (ref 0.0–0.2)

## 2019-08-20 LAB — MAGNESIUM: Magnesium: 1.9 mg/dL (ref 1.7–2.4)

## 2019-08-20 MED ORDER — SALINE SPRAY 0.65 % NA SOLN
1.0000 | NASAL | Status: DC | PRN
  Filled 2019-08-20: qty 44

## 2019-08-20 NOTE — Progress Notes (Signed)
PROGRESS NOTE  Amber Medina [id number] DOB: 1951/01/12   PCP: [name], MD  Patient is from: home.  Lives alone.  Independently ambulates at baseline.  DOA: 08/13/2019 LOS: 7  Brief Narrative / Interim history: 68 year old female with history of colon cancer status post resection in [place] but did not receive chemotherapy or radiation, A. fib on Xarelto, was admitted to the hospital on 08/13/2019 with generalized weakness, abdominal pain and distention and appetite loss.  CT scan on admission showed multiple liver lesions suspect for metastatic disease as well as peritoneal lesions suspect for carcinomatosis.  Oncology and IR were consulted.  She underwent biopsy of abdominal mass on 08/15/2019.  Pathology confirmed metastatic colon cancer.  Had Port A Cath placed 08/16/2019.  Started chemotherapy on 08/18/2019.  Evaluated by PT/OT who recommended SNF.  Subjective: No major events overnight of this morning.  No complaints other than some nasal congestion.  Reports making good urine output.  Denies chest pain, dyspnea, GI or UTI symptoms.  Objective: Vitals:   08/19/19 0517 08/19/19 0600 08/19/19 2210 08/20/19 0634  BP: 136/79  125/73 104/65  Pulse: 68  67 66  Resp: 15  17 16   Temp: 97.9 F (36.6 C)  97.7 F (36.5 C) 97.9 F (36.6 C)  TempSrc: Oral  Oral Oral  SpO2: 95%  97% 96%  Weight:  (!) 143.4 kg    Height:        Intake/Output Summary (Last 24 hours) at 08/20/2019 1242 Last data filed at 08/20/2019 0930 Gross per 24 hour  Intake 360 ml  Output -  Net 360 ml   Filed Weights   08/13/19 0156 08/19/19 0600  Weight: (!) 142.4 kg (!) 143.4 kg    Examination:  GENERAL: No acute distress.  Appears well.  HEENT: MMM.  Vision and hearing grossly intact.  NECK: Supple.  No apparent JVD.  RESP:  No IWOB. Good air movement bilaterally. CVS:  RRR. Heart sounds normal.  ABD/GI/GU: Bowel sounds present. Soft. Non tender.  MSK/EXT:  Moves extremities. No apparent  deformity or edema.  SKIN: no apparent skin lesion or wound NEURO: Awake, alert and oriented appropriately.  No apparent focal neuro deficit. PSYCH: Calm. Normal affect.  Port a cath over right chest  Assessment & Plan: AKI on CKD-3a with azotemia: Baseline 1.2 in 02/2018.  Likely due to dehydration/poor p.o. intake.  -Creatinine 2.8 (admit)>>> 1.23>1.26 -Hold p.o. Lasix and liberate fluid intake  Colon adenocarcinoma status post resection now with multiple metastasis as noted on CT abdomen and pelvis. -Abdominal biopsy confirms metastasis of colon cancer -Port a cath placed on 08/16/2019. -Ultrasound-guided biopsy of abdominal wall on 08/15/2019 confirmed colon cancer -Started on chemotherapy 11/12. -Oncology managing.  Leukocytosis/bandemia: Unclear source.  Could be related to malignancy.  Afebrile.  Infectious work-up not revealing.  Off antibiotics. Uptrended after chemotherapy but improving.. -Continue monitoring with daily CBC  Chronic A. Fib: Rate and rhythm controlled -Continue home amiodarone, Cardizem, metoprolol and Xarelto.  Morbid obesity: BMI 49 -Encourage lifestyle change to lose weight.  History of left popliteal vein DVT/PE -Continue home Xarelto.  Chronic diastolic CHF: Stable.  Echo in 5/20 with EF of 55 to 60%, G1 DD.  Trace edema on exam.  I and O incomplete but reports good urine output.   Weight is not reliable. -Creatinine up trended with mild azotemia.  Hold Lasix -Daily weight, intake output and renal function -Liberate fluid intake.  Anemia of chronic disease: Stable.  Anemia panel consistent with anemia  of chronic disease -Monitor H&H  Generalized weakness/deconditioning: Lives alone.  Ambulates independently at baseline.  Has not been able to care for herself recently which is a new change for her. -PT/OT eval-recommended SNF -CSW consulted.  Nasal congestion -Try saline nasal spray-if no improvement, will try Afrin for 2 to 3 days                DVT prophylaxis: Xarelto Code Status: Full code Family Communication: Patient and/or RN. Available if any question. Disposition Plan: Remains inpatient pending clinical improvement.  Final disposition SNF. Consultants: Oncology  Procedures:  08/15/2019-ultrasound-guided abdominal mass biopsy 08/16/2019-port a cath placement  Microbiology summarized: 11/7-COVID-19 negative 11/9-blood cultures negative  Sch Meds:  Scheduled Meds: . amiodarone  100 mg Oral Daily  . Chlorhexidine Gluconate Cloth  6 each Topical Daily  . diltiazem  240 mg Oral Daily  . FLUOROURACIL (ADRUCIL) CHEMO infusion For Inpatient Use  2,000 mg/m2 (Treatment Plan Recorded) Intravenous Once  . metoprolol succinate  25 mg Oral Daily  . rivaroxaban  20 mg Oral Q supper  . sodium chloride flush  10-40 mL Intracatheter Q12H   Continuous Infusions: PRN Meds:.acetaminophen **OR** acetaminophen, HYDROcodone-acetaminophen, iohexol, LORazepam, sodium chloride, sodium chloride flush  Antimicrobials: Anti-infectives (From admission, onward)   Start     Dose/Rate Route Frequency Ordered Stop   08/16/19 1230  ceFAZolin (ANCEF) 3 g in dextrose 5 % 50 mL IVPB     3 g 100 mL/hr over 30 Minutes Intravenous To Radiology 08/16/19 1223 08/16/19 1700   08/13/19 1300  cefTRIAXone (ROCEPHIN) 2 g in sodium chloride 0.9 % 100 mL IVPB  Status:  Discontinued     2 g 200 mL/hr over 30 Minutes Intravenous Every 24 hours 08/13/19 1228 08/16/19 1059       I have personally reviewed the following labs and images: CBC: Recent Labs  Lab 08/17/19 1022 08/17/19 1513 08/18/19 0459 08/19/19 0450 08/20/19 0758  WBC 16.7* 16.3* 16.6* 22.8* 17.2*  NEUTROABS  --  13.7*  --   --   --   HGB 10.5* 11.0* 10.7* 10.1* 10.2*  HCT 35.2* 36.9 35.8* 34.3* 34.5*  MCV 90.5 89.3 88.6 90.7 90.6  PLT 313 332 255 277 318   BMP &GFR Recent Labs  Lab 08/15/19 0529 08/16/19 0501 08/17/19 1022 08/18/19 0459 08/19/19 0450 08/20/19  0758  NA 139 138 136 132* 137 141  K 3.7 4.3 3.6 4.2 4.3 5.1  CL 108 106 106 103 106 110  CO2 19* 22 21* 20* 21* 24  GLUCOSE 124* 125* 192* 112* 219* 151*  BUN 25* 21 17 19  26* 36*  CREATININE 1.80* 1.50* 1.34* 1.23* 1.25* 1.26*  CALCIUM 8.1* 7.8* 7.8* 7.4* 7.8* 7.8*  MG 1.9  --   --  1.5* 1.5* 1.9   Estimated Creatinine Clearance: 61.8 mL/min (A) (by C-G formula based on SCr of 1.26 mg/dL (H)). Liver & Pancreas: No results for input(s): AST, ALT, ALKPHOS, BILITOT, PROT, ALBUMIN in the last 168 hours. No results for input(s): LIPASE, AMYLASE in the last 168 hours. No results for input(s): AMMONIA in the last 168 hours. Diabetic: No results for input(s): HGBA1C in the last 72 hours. No results for input(s): GLUCAP in the last 168 hours. Cardiac Enzymes: No results for input(s): CKTOTAL, CKMB, CKMBINDEX, TROPONINI in the last 168 hours. No results for input(s): PROBNP in the last 8760 hours. Coagulation Profile: Recent Labs  Lab 08/13/19 1305 08/15/19 0529  INR 1.6* 1.6*   Thyroid Function Tests:  No results for input(s): TSH, T4TOTAL, FREET4, T3FREE, THYROIDAB in the last 72 hours. Lipid Profile: No results for input(s): CHOL, HDL, LDLCALC, TRIG, CHOLHDL, LDLDIRECT in the last 72 hours. Anemia Panel: Recent Labs    08/18/19 1615  VITAMINB12 722  FOLATE >100.0  FERRITIN 429*  TIBC 114*  IRON 18*  RETICCTPCT 1.2   Urine analysis:    Component Value Date/Time   COLORURINE AMBER (A) 08/13/2019 0222   APPEARANCEUR CLOUDY (A) 08/13/2019 0222   LABSPEC 1.012 08/13/2019 0222   PHURINE 5.0 08/13/2019 0222   GLUCOSEU 50 (A) 08/13/2019 0222   HGBUR SMALL (A) 08/13/2019 0222   BILIRUBINUR NEGATIVE 08/13/2019 0222   KETONESUR NEGATIVE 08/13/2019 0222   PROTEINUR 100 (A) 08/13/2019 0222   NITRITE NEGATIVE 08/13/2019 0222   LEUKOCYTESUR NEGATIVE 08/13/2019 0222   Sepsis Labs: Invalid input(s): PROCALCITONIN, [place]  Microbiology: Recent Results (from the past 240  hour(s))  SARS CORONAVIRUS 2 (TAT 6-24 HRS) Nasopharyngeal Nasopharyngeal Swab     Status: None   Collection Time: 08/13/19  3:11 AM   Specimen: Nasopharyngeal Swab  Result Value Ref Range Status   SARS Coronavirus 2 NEGATIVE NEGATIVE Final    Comment: (NOTE) SARS-CoV-2 target nucleic acids are NOT DETECTED. The SARS-CoV-2 RNA is generally detectable in upper and lower respiratory specimens during the acute phase of infection. Negative results do not preclude SARS-CoV-2 infection, do not rule out co-infections with other pathogens, and should not be used as the sole basis for treatment or other patient management decisions. Negative results must be combined with clinical observations, patient history, and epidemiological information. The expected result is Negative. Fact Sheet for Patients: SugarRoll.be Fact Sheet for Healthcare Providers: https://www.woods-mathews.com/ This test is not yet approved or cleared by the [place] FDA and  has been authorized for detection and/or diagnosis of SARS-CoV-2 by FDA under an Emergency Use Authorization (EUA). This EUA will remain  in effect (meaning this test can be used) for the duration of the COVID-19 declaration under Section 56 4(b)(1) of the Act, 21 U.S.C. section 360bbb-3(b)(1), unless the authorization is terminated or revoked sooner. Performed at [facility], [place] [address], [place], [place] [address]   Blood culture (routine x 2)     Status: None   Collection Time: 08/13/19  8:15 AM   Specimen: BLOOD  Result Value Ref Range Status   Specimen Description   Final    BLOOD BLOOD LEFT FOREARM Performed at [facility] [address] [place], [place] [address]    Special Requests   Final    BOTTLES DRAWN AEROBIC AND ANAEROBIC Blood Culture adequate volume Performed at [facility] [address], [place], [place] [address]    Culture    Final    NO GROWTH 5 DAYS Performed at [facility], [place] [address], [place], [place] [address]    Report Status 08/18/2019 FINAL  Final    Radiology Studies: No results found.  Sonora Catlin T. Stanford  If 7PM-7AM, please contact night-coverage [email or web address] Password [email or web address] 08/20/2019, 12:42 PM

## 2019-08-21 DIAGNOSIS — R0981 Nasal congestion: Secondary | ICD-10-CM

## 2019-08-21 LAB — CBC
HCT: 33.3 % — ABNORMAL LOW (ref 36.0–46.0)
Hemoglobin: 9.8 g/dL — ABNORMAL LOW (ref 12.0–15.0)
MCH: 26.6 pg (ref 26.0–34.0)
MCHC: 29.4 g/dL — ABNORMAL LOW (ref 30.0–36.0)
MCV: 90.2 fL (ref 80.0–100.0)
Platelets: 272 10*3/uL (ref 150–400)
RBC: 3.69 MIL/uL — ABNORMAL LOW (ref 3.87–5.11)
RDW: 17.2 % — ABNORMAL HIGH (ref 11.5–15.5)
WBC: 10.5 10*3/uL (ref 4.0–10.5)
nRBC: 0 % (ref 0.0–0.2)

## 2019-08-21 LAB — BASIC METABOLIC PANEL
Anion gap: 8 (ref 5–15)
BUN: 37 mg/dL — ABNORMAL HIGH (ref 8–23)
CO2: 23 mmol/L (ref 22–32)
Calcium: 7.8 mg/dL — ABNORMAL LOW (ref 8.9–10.3)
Chloride: 106 mmol/L (ref 98–111)
Creatinine, Ser: 1.26 mg/dL — ABNORMAL HIGH (ref 0.44–1.00)
GFR calc Af Amer: 50 mL/min — ABNORMAL LOW (ref >60)
GFR calc non Af Amer: 43 mL/min — ABNORMAL LOW (ref >60)
Glucose, Bld: 152 mg/dL — ABNORMAL HIGH (ref 70–99)
Potassium: 4.6 mmol/L (ref 3.5–5.1)
Sodium: 137 mmol/L (ref 135–145)

## 2019-08-21 LAB — SODIUM, URINE, RANDOM: Sodium, Ur: 51 mmol/L

## 2019-08-21 LAB — MAGNESIUM: Magnesium: 1.9 mg/dL (ref 1.7–2.4)

## 2019-08-21 LAB — CREATININE, URINE, RANDOM: Creatinine, Urine: 61.19 mg/dL

## 2019-08-21 MED ORDER — OXYMETAZOLINE HCL 0.05 % NA SOLN
1.0000 | Freq: Two times a day (BID) | NASAL | Status: DC
  Administered 2019-08-21 – 2019-08-22 (×3): 1 via NASAL
  Filled 2019-08-21: qty 15

## 2019-08-21 NOTE — Progress Notes (Signed)
PROGRESS NOTE  Amber Medina [id number] DOB: 1950/11/25   PCP: [name], MD  Patient is from: home.  Lives alone.  Independently ambulates at baseline.  DOA: 08/13/2019 LOS: 8  Brief Narrative / Interim history: 68 year old female with history of colon cancer status post resection in [place] but did not receive chemotherapy or radiation, A. fib on Xarelto, was admitted to the hospital on 08/13/2019 with generalized weakness, abdominal pain and distention and appetite loss.  CT scan on admission showed multiple liver lesions suspect for metastatic disease as well as peritoneal lesions suspect for carcinomatosis.  Oncology and IR were consulted.  She underwent biopsy of abdominal mass on 08/15/2019.  Pathology confirmed metastatic colon cancer.  Had Port A Cath placed 08/16/2019.  Started chemotherapy on 08/18/2019.  Evaluated by PT/OT who recommended SNF.  Subjective: No major events overnight of this morning.  No complaints this morning.  Denies chest pain, dyspnea, GI or UTI symptoms.  Reports good urine output.  Objective: Vitals:   08/20/19 1419 08/20/19 2020 08/21/19 0525 08/21/19 1353  BP: 105/77 (!) 106/59 113/63 103/66  Pulse: 93 73 69 97  Resp: 15 20 18  (!) 22  Temp: 99.3 F (37.4 C) 98.4 F (36.9 C) 97.9 F (36.6 C) 98.7 F (37.1 C)  TempSrc: Oral Oral Oral Oral  SpO2: 97% 94% 96% 99%  Weight:      Height:        Intake/Output Summary (Last 24 hours) at 08/21/2019 1421 Last data filed at 08/21/2019 1351 Gross per 24 hour  Intake 240 ml  Output 500 ml  Net -260 ml   Filed Weights   08/13/19 0156 08/19/19 0600  Weight: (!) 142.4 kg (!) 143.4 kg    Examination:  GENERAL: No acute distress.  Appears well.  HEENT: MMM.  Vision and hearing grossly intact.  NECK: Supple.  No apparent JVD but difficult exam..  RESP:  No IWOB.  Fair air movement bilaterally. CVS:  RRR. Heart sounds normal.  ABD/GI/GU: Bowel sounds present. Soft. Non tender.  MSK/EXT:  Moves  extremities. No apparent deformity or edema.  SKIN: no apparent skin lesion or wound NEURO: Awake, alert and oriented appropriately.  No apparent focal neuro deficit. PSYCH: Calm. Normal affect.  Port a cath over right chest  Assessment & Plan: AKI on CKD-3a with azotemia: Baseline 1.2 in 02/2018.  Likely due to dehydration/poor p.o. intake.  Rehydrated and improved.  Incomplete I&O but reports good urine output. -Creatinine 2.8 (admit)>>> 1.23>1.26 -Hold p.o. 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 -Abdominal biopsy confirms metastasis of colon cancer -Port a cath placed on 08/16/2019. -Ultrasound-guided biopsy of abdominal wall on 08/15/2019 confirmed colon cancer -Started on chemotherapy 11/12. -Oncology managing.  Leukocytosis/bandemia: Unclear source.  Could be related to malignancy/chemo.  Afebrile.  Infectious work-up not revealing. Resolved.  Chronic A. Fib: Rate and rhythm controlled -Continue home amiodarone, Cardizem, metoprolol and Xarelto.  Morbid obesity: BMI 49 -Encourage lifestyle change to lose weight.  History of left popliteal vein DVT/PE -Continue home Xarelto.  Chronic diastolic CHF: Stable.  Echo in 5/20 with EF of 55 to 60%, G1 DD.  No signs of fluid overload.  I and O incomplete but reports good urine output.  800 cc + unmeasured voids charted. -Continue holding Lasix in the setting of AKI -Daily weight, intake output and renal function -Liberate fluid intake.  Anemia of chronic disease: Stable.  Anemia panel consistent with  anemia of chronic disease -Monitor H&H  Generalized weakness/deconditioning: Lives alone.  Ambulates independently at baseline.  Has not been able to care for herself recently which is a new change for her. -PT/OT eval-recommended SNF -CSW consulted.  Nasal congestion: No improvement with saline nose spray -Afrin nasal spray for 2 days                DVT prophylaxis: Xarelto Code Status: Full code Family Communication: Patient and/or RN. Available if any question. Disposition Plan: Remains inpatient pending clinical improvement.  Final disposition SNF. Consultants: Oncology  Procedures:  08/15/2019-ultrasound-guided abdominal mass biopsy 08/16/2019-port a cath placement  Microbiology summarized: 11/7-COVID-19 negative 11/9-blood cultures negative  Sch Meds:  Scheduled Meds: . amiodarone  100 mg Oral Daily  . Chlorhexidine Gluconate Cloth  6 each Topical Daily  . diltiazem  240 mg Oral Daily  . metoprolol succinate  25 mg Oral Daily  . rivaroxaban  20 mg Oral Q supper  . sodium chloride flush  10-40 mL Intracatheter Q12H   Continuous Infusions: PRN Meds:.acetaminophen **OR** acetaminophen, HYDROcodone-acetaminophen, iohexol, LORazepam, sodium chloride, sodium chloride flush  Antimicrobials: Anti-infectives (From admission, onward)   Start     Dose/Rate Route Frequency Ordered Stop   08/16/19 1230  ceFAZolin (ANCEF) 3 g in dextrose 5 % 50 mL IVPB     3 g 100 mL/hr over 30 Minutes Intravenous To Radiology 08/16/19 1223 08/16/19 1700   08/13/19 1300  cefTRIAXone (ROCEPHIN) 2 g in sodium chloride 0.9 % 100 mL IVPB  Status:  Discontinued     2 g 200 mL/hr over 30 Minutes Intravenous Every 24 hours 08/13/19 1228 08/16/19 1059       I have personally reviewed the following labs and images: CBC: Recent Labs  Lab 08/17/19 1513 08/18/19 0459 08/19/19 0450 08/20/19 0758 08/21/19 0600  WBC 16.3* 16.6* 22.8* 17.2* 10.5  NEUTROABS 13.7*  --   --   --   --   HGB 11.0* 10.7* 10.1* 10.2* 9.8*  HCT 36.9 35.8* 34.3* 34.5* 33.3*  MCV 89.3 88.6 90.7 90.6 90.2  PLT 332 255 277 318 272   BMP &GFR Recent Labs  Lab 08/15/19 0529  08/17/19 1022 08/18/19 0459 08/19/19 0450 08/20/19 0758 08/21/19 0600  NA 139   < > 136 132* 137 141 137  K 3.7   < > 3.6 4.2 4.3 5.1 4.6  CL 108   < > 106 103 106 110 106  CO2 19*   < >  21* 20* 21* 24 23  GLUCOSE 124*   < > 192* 112* 219* 151* 152*  BUN 25*   < > 17 19 26* 36* 37*  CREATININE 1.80*   < > 1.34* 1.23* 1.25* 1.26* 1.26*  CALCIUM 8.1*   < > 7.8* 7.4* 7.8* 7.8* 7.8*  MG 1.9  --   --  1.5* 1.5* 1.9 1.9   < > = values in this interval not displayed.   Estimated Creatinine Clearance: 61.8 mL/min (A) (by C-G formula based on SCr of 1.26 mg/dL (H)). Liver & Pancreas: No results for input(s): AST, ALT, ALKPHOS, BILITOT, PROT, ALBUMIN in the last 168 hours. No results for input(s): LIPASE, AMYLASE in the last 168 hours. No results for input(s): AMMONIA in the last 168 hours. Diabetic: No results for input(s): HGBA1C in the last 72 hours. No results for input(s): GLUCAP in the last 168 hours. Cardiac Enzymes: No results for input(s): CKTOTAL, CKMB, CKMBINDEX, TROPONINI in the last 168 hours. No results  for input(s): PROBNP in the last 8760 hours. Coagulation Profile: Recent Labs  Lab 08/15/19 0529  INR 1.6*   Thyroid Function Tests: No results for input(s): TSH, T4TOTAL, FREET4, T3FREE, THYROIDAB in the last 72 hours. Lipid Profile: No results for input(s): CHOL, HDL, LDLCALC, TRIG, CHOLHDL, LDLDIRECT in the last 72 hours. Anemia Panel: Recent Labs    08/18/19 1615  VITAMINB12 722  FOLATE >100.0  FERRITIN 429*  TIBC 114*  IRON 18*  RETICCTPCT 1.2   Urine analysis:    Component Value Date/Time   COLORURINE AMBER (A) 08/13/2019 0222   APPEARANCEUR CLOUDY (A) 08/13/2019 0222   LABSPEC 1.012 08/13/2019 0222   PHURINE 5.0 08/13/2019 0222   GLUCOSEU 50 (A) 08/13/2019 0222   HGBUR SMALL (A) 08/13/2019 0222   BILIRUBINUR NEGATIVE 08/13/2019 0222   KETONESUR NEGATIVE 08/13/2019 0222   PROTEINUR 100 (A) 08/13/2019 0222   NITRITE NEGATIVE 08/13/2019 0222   LEUKOCYTESUR NEGATIVE 08/13/2019 0222   Sepsis Labs: Invalid input(s): PROCALCITONIN, [place]  Microbiology: Recent Results (from the past 240 hour(s))  SARS CORONAVIRUS 2 (TAT 6-24 HRS)  Nasopharyngeal Nasopharyngeal Swab     Status: None   Collection Time: 08/13/19  3:11 AM   Specimen: Nasopharyngeal Swab  Result Value Ref Range Status   SARS Coronavirus 2 NEGATIVE NEGATIVE Final    Comment: (NOTE) SARS-CoV-2 target nucleic acids are NOT DETECTED. The SARS-CoV-2 RNA is generally detectable in upper and lower respiratory specimens during the acute phase of infection. Negative results do not preclude SARS-CoV-2 infection, do not rule out co-infections with other pathogens, and should not be used as the sole basis for treatment or other patient management decisions. Negative results must be combined with clinical observations, patient history, and epidemiological information. The expected result is Negative. Fact Sheet for Patients: SugarRoll.be Fact Sheet for Healthcare Providers: https://www.woods-mathews.com/ This test is not yet approved or cleared by the [place] FDA and  has been authorized for detection and/or diagnosis of SARS-CoV-2 by FDA under an Emergency Use Authorization (EUA). This EUA will remain  in effect (meaning this test can be used) for the duration of the COVID-19 declaration under Section 56 4(b)(1) of the Act, 21 U.S.C. section 360bbb-3(b)(1), unless the authorization is terminated or revoked sooner. Performed at [facility], [place] [address], [place], [place] [address]   Blood culture (routine x 2)     Status: None   Collection Time: 08/13/19  8:15 AM   Specimen: BLOOD  Result Value Ref Range Status   Specimen Description   Final    BLOOD BLOOD LEFT FOREARM Performed at [facility] [address], [place], [place] [address]    Special Requests   Final    BOTTLES DRAWN AEROBIC AND ANAEROBIC Blood Culture adequate volume Performed at [facility] [address] [place], [place] [address]    Culture   Final    NO GROWTH 5 DAYS Performed at [facility], [place] [address], [place],  [address]    Report Status 08/18/2019 FINAL  Final    Radiology Studies: No results found.  Lafayette Dunlevy T. Jerome  If 7PM-7AM, please contact night-coverage [email or web address] Password [email or web address] 08/21/2019, 2:21 PM

## 2019-08-22 DIAGNOSIS — K1379 Other lesions of oral mucosa: Secondary | ICD-10-CM

## 2019-08-22 LAB — CBC
HCT: 30.4 % — ABNORMAL LOW (ref 36.0–46.0)
Hemoglobin: 9.1 g/dL — ABNORMAL LOW (ref 12.0–15.0)
MCH: 26.7 pg (ref 26.0–34.0)
MCHC: 29.9 g/dL — ABNORMAL LOW (ref 30.0–36.0)
MCV: 89.1 fL (ref 80.0–100.0)
Platelets: 212 10*3/uL (ref 150–400)
RBC: 3.41 MIL/uL — ABNORMAL LOW (ref 3.87–5.11)
RDW: 17 % — ABNORMAL HIGH (ref 11.5–15.5)
WBC: 12.6 10*3/uL — ABNORMAL HIGH (ref 4.0–10.5)
nRBC: 0 % (ref 0.0–0.2)

## 2019-08-22 LAB — BASIC METABOLIC PANEL
Anion gap: 8 (ref 5–15)
BUN: 28 mg/dL — ABNORMAL HIGH (ref 8–23)
CO2: 23 mmol/L (ref 22–32)
Calcium: 7.6 mg/dL — ABNORMAL LOW (ref 8.9–10.3)
Chloride: 105 mmol/L (ref 98–111)
Creatinine, Ser: 1.14 mg/dL — ABNORMAL HIGH (ref 0.44–1.00)
GFR calc Af Amer: 56 mL/min — ABNORMAL LOW (ref >60)
GFR calc non Af Amer: 49 mL/min — ABNORMAL LOW (ref >60)
Glucose, Bld: 170 mg/dL — ABNORMAL HIGH (ref 70–99)
Potassium: 4 mmol/L (ref 3.5–5.1)
Sodium: 136 mmol/L (ref 135–145)

## 2019-08-22 LAB — SARS CORONAVIRUS 2 (TAT 6-24 HRS): SARS Coronavirus 2: NEGATIVE

## 2019-08-22 LAB — MAGNESIUM: Magnesium: 1.6 mg/dL — ABNORMAL LOW (ref 1.7–2.4)

## 2019-08-22 LAB — OSMOLALITY, URINE: Osmolality, Ur: 570 mOsm/kg (ref 300–900)

## 2019-08-22 MED ORDER — PHENOL 1.4 % MT LIQD
1.0000 | OROMUCOSAL | Status: DC | PRN
  Administered 2019-08-22: 1 via OROMUCOSAL
  Filled 2019-08-22: qty 177

## 2019-08-22 MED ORDER — MAGIC MOUTHWASH W/LIDOCAINE
5.0000 mL | Freq: Three times a day (TID) | ORAL | Status: DC
  Administered 2019-08-22 – 2019-08-23 (×4): 5 mL via ORAL
  Filled 2019-08-22 (×4): qty 5

## 2019-08-22 MED ORDER — MAGNESIUM SULFATE 2 GM/50ML IV SOLN
2.0000 g | Freq: Once | INTRAVENOUS | Status: AC
  Administered 2019-08-22: 2 g via INTRAVENOUS
  Filled 2019-08-22: qty 50

## 2019-08-22 NOTE — Care Management Important Message (Signed)
Important Message  Patient Details IM Letter given to Marney Doctor RN to present to the Patient Name: Amber Medina MRN: [id number] Date of Birth: 02-19-1951   Medicare Important Message Given:  Yes     Kerin Salen 08/22/2019, 10:37 AM

## 2019-08-22 NOTE — Progress Notes (Signed)
Physical Therapy Treatment Patient Details Name: Amber Medina MRN: [id number] DOB: 06-Jun-1951 Today's Date: 08/22/2019    History of Present Illness 68 yo female admitted with Afib. Hx of PE, A fib, morbid obesity, anemia, CHF, CKD and on chemotherapy    PT Comments    Pt was able to increase gait today, but fatigues very easily with HR increasing to 120 with minimal gait.  She needs a chair follow for rest breaks with walking.   Will continue to benefit from acute PT services.    Follow Up Recommendations  SNF     Equipment Recommendations       Recommendations for Other Services       Precautions / Restrictions Precautions Precautions: Fall    Mobility  Bed Mobility Overal bed mobility: Needs Assistance Bed Mobility: Sit to Supine       Sit to supine: Min assist   General bed mobility comments: min A for bil LE; increased time; use of bedrails  Transfers   Equipment used: Rolling walker (2 wheeled) Transfers: Sit to/from Stand Sit to Stand: Min assist         General transfer comment: cues for safe hand placement  Ambulation/Gait Ambulation/Gait assistance: Min assist Gait Distance (Feet): 8 Feet Assistive device: Rolling walker (2 wheeled) Gait Pattern/deviations: Step-through pattern;Decreased stride length Gait velocity: decreased   General Gait Details: 8'x3 with 2 seated rest breaks for 3-5 minutes to recover;  pt leaned on RW and required chair follow   Stairs             Wheelchair Mobility    Modified Rankin (Stroke Patients Only)       Balance Overall balance assessment: Needs assistance Sitting-balance support: Feet supported;Bilateral upper extremity supported Sitting balance-Leahy Scale: Good     Standing balance support: Bilateral upper extremity supported Standing balance-Leahy Scale: Poor                              Cognition Arousal/Alertness: Awake/alert Behavior During Therapy: WFL for tasks  assessed/performed Overall Cognitive Status: Within Functional Limits for tasks assessed                                 General Comments: c/o fatigue and weakness today compared to yesterday      Exercises      General Comments General comments (skin integrity, edema, etc.): Pt on RA with sats 95% or greater;  HR was 90 bpm rest and up to 120 bpm with activity; took 3-5 minutes to recover      Pertinent Vitals/Pain Pain Assessment: No/denies pain    Home Living                      Prior Function            PT Goals (current goals can now be found in the care plan section) Progress towards PT goals: Progressing toward goals    Frequency    Min 2X/week      PT Plan Current plan remains appropriate    Co-evaluation              AM-PAC PT "6 Clicks" Mobility   Outcome Measure  Help needed turning from your back to your side while in a flat bed without using bedrails?: A Lot Help needed moving from lying on your back to sitting  on the side of a flat bed without using bedrails?: A Little Help needed moving to and from a bed to a chair (including a wheelchair)?: A Little Help needed standing up from a chair using your arms (e.g., wheelchair or bedside chair)?: A Little Help needed to walk in hospital room?: A Little Help needed climbing 3-5 steps with a railing? : A Lot 6 Click Score: 16    End of Session Equipment Utilized During Treatment: Gait belt Activity Tolerance: Patient limited by fatigue Patient left: in bed;with call bell/phone within reach;with bed alarm set Nurse Communication: Mobility status PT Visit Diagnosis: Unsteadiness on feet (R26.81);Muscle weakness (generalized) (M62.81);Pain     Time: 1415-1446 PT Time Calculation (min) (ACUTE ONLY): 31 min  Charges:  $Gait Training: 8-22 mins $Therapeutic Activity: 8-22 mins                     Maggie Font, PT Acute Rehab Services Pager [phone] [facility]  [phone] [facility] [phone]    Karlton Lemon 08/22/2019, 4:12 PM

## 2019-08-22 NOTE — Progress Notes (Signed)
PROGRESS NOTE  Amber Medina [id number] DOB: 10-18-1950   PCP: [name], MD  Patient is from: home.  Lives alone.  Independently ambulates at baseline.  DOA: 08/13/2019 LOS: 9  Brief Narrative / Interim history: 68 year old female with history of colon cancer status post resection in [place] but did not receive chemotherapy or radiation, A. fib on Xarelto, was admitted to the hospital on 08/13/2019 with generalized weakness, abdominal pain and distention and appetite loss.  CT scan on admission showed multiple liver lesions suspect for metastatic disease as well as peritoneal lesions suspect for carcinomatosis.  Oncology and IR were consulted.  She underwent biopsy of abdominal mass on 08/15/2019.  Pathology confirmed metastatic colon cancer.  Had Port A Cath placed 08/16/2019.  Started chemotherapy on 08/18/2019.  Evaluated by PT/OT who recommended SNF.  Subjective: No major events overnight of this morning.  She says she was not able to fall asleep last night.  She continues to endorse some soreness in her mouth and nasal congestion.  Denies dysphagia, chest pain, dyspnea, GI or UTI symptoms.  Reports good urine output.  Objective: Vitals:   08/21/19 0525 08/21/19 1353 08/21/19 2051 08/22/19 0601  BP: 113/63 103/66 127/66 117/65  Pulse: 69 97 82 74  Resp: 18 (!) 22 20 20   Temp: 97.9 F (36.6 C) 98.7 F (37.1 C) 99.1 F (37.3 C) 98.5 F (36.9 C)  TempSrc: Oral Oral Oral Oral  SpO2: 96% 99% 95% 95%  Weight:      Height:        Intake/Output Summary (Last 24 hours) at 08/22/2019 1018 Last data filed at 08/22/2019 [id number] Gross per 24 hour  Intake 480 ml  Output 1352 ml  Net -872 ml   Filed Weights   08/13/19 0156 08/19/19 0600  Weight: (!) 142.4 kg (!) 143.4 kg    Examination:  GENERAL: No acute distress.  Lying in bed comfortably. HEENT: MMM.  Vision and hearing grossly intact.  NECK: Supple.  No apparent JVD.  RESP:  No IWOB. Good air movement bilaterally. CVS:   RRR. Heart sounds normal.  ABD/GI/GU: Bowel sounds present. Soft.  Mild discomfort to palpation. MSK/EXT:  Moves extremities. No apparent deformity or edema.  SKIN: no apparent skin lesion or wound NEURO: Awake, alert and oriented appropriately.  No apparent focal neuro deficit. PSYCH: Calm. Normal affect.  Port a cath over right chest  Assessment & Plan: AKI on CKD-3a with azotemia: Baseline 1.2 in 02/2018.  Likely due to dehydration/poor p.o. intake.  Rehydrated and improved.  About 1 L urine output charted.  -Creatinine 2.8 (admit)>>> 1.23>>1.14 -Hold p.o. 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 -Abdominal biopsy on 11/9 confirmed metastasis of colon cancer -Port a cath placed on 08/16/2019. -Started chemo on 11/12. -Oncology managing.  Leukocytosis/bandemia: Unclear source.  Could be related to malignancy/chemo.  Afebrile.  Infectious work-up not revealing.  Resolving. -Monitor intermittently  Chronic A. Fib: Rate and rhythm controlled -Continue home amiodarone, Cardizem, metoprolol and Xarelto.  Morbid obesity: BMI 49 -Encourage lifestyle change to lose weight.  History of left popliteal vein DVT/PE -Continue home Xarelto.  Chronic diastolic CHF: Stable.  Echo in 5/20 with EF of 55 to 60%, G1 DD.  No signs of fluid overload. About 1 L urine output in the last 24 hours.  No weight charted in the last 3 days. -Continue holding Lasix in the setting of AKI -Daily weight, intake output and  renal function-discussed with RN about daily weights -Liberate fluid intake.  Anemia of chronic disease: Stable.  Anemia panel consistent with anemia of chronic disease -Monitor H&H  Generalized weakness/deconditioning: Lives alone.  Ambulates independently at baseline.  Has not been able to care for herself recently which is a new change for her.  Somewhat fatigued after chemo. -PT/OT eval-recommended SNF  -CSW consulted.  Nasal congestion/sore mouth: no apparent lesion -Saline nose spray and Afrin -Magic mouthwash with lidocaine  Hypomagnesemia: Replenished and recheck.               DVT prophylaxis: Xarelto Code Status: Full code Family Communication: Patient and/or RN. Available if any question. Disposition Plan: Remains inpatient.  Final disposition likely SNF Consultants: Oncology  Procedures:  08/15/2019-ultrasound-guided abdominal mass biopsy 08/16/2019-port a cath placement  Microbiology summarized: 11/7-COVID-19 negative 11/9-blood cultures negative  Sch Meds:  Scheduled Meds: . amiodarone  100 mg Oral Daily  . Chlorhexidine Gluconate Cloth  6 each Topical Daily  . diltiazem  240 mg Oral Daily  . magic mouthwash w/lidocaine  5 mL Oral TID  . metoprolol succinate  25 mg Oral Daily  . oxymetazoline  1 spray Each Nare BID  . rivaroxaban  20 mg Oral Q supper  . sodium chloride flush  10-40 mL Intracatheter Q12H   Continuous Infusions: PRN Meds:.acetaminophen **OR** acetaminophen, HYDROcodone-acetaminophen, iohexol, LORazepam, phenol, sodium chloride, sodium chloride flush  Antimicrobials: Anti-infectives (From admission, onward)   Start     Dose/Rate Route Frequency Ordered Stop   08/16/19 1230  ceFAZolin (ANCEF) 3 g in dextrose 5 % 50 mL IVPB     3 g 100 mL/hr over 30 Minutes Intravenous To Radiology 08/16/19 1223 08/16/19 1700   08/13/19 1300  cefTRIAXone (ROCEPHIN) 2 g in sodium chloride 0.9 % 100 mL IVPB  Status:  Discontinued     2 g 200 mL/hr over 30 Minutes Intravenous Every 24 hours 08/13/19 1228 08/16/19 1059       I have personally reviewed the following labs and images: CBC: Recent Labs  Lab 08/17/19 1513 08/18/19 0459 08/19/19 0450 08/20/19 0758 08/21/19 0600 08/22/19 0549  WBC 16.3* 16.6* 22.8* 17.2* 10.5 12.6*  NEUTROABS 13.7*  --   --   --   --   --   HGB 11.0* 10.7* 10.1* 10.2* 9.8* 9.1*  HCT 36.9 35.8* 34.3* 34.5* 33.3* 30.4*   MCV 89.3 88.6 90.7 90.6 90.2 89.1  PLT 332 255 277 318 272 212   BMP &GFR Recent Labs  Lab 08/18/19 0459 08/19/19 0450 08/20/19 0758 08/21/19 0600 08/22/19 0549  NA 132* 137 141 137 136  K 4.2 4.3 5.1 4.6 4.0  CL 103 106 110 106 105  CO2 20* 21* 24 23 23   GLUCOSE 112* 219* 151* 152* 170*  BUN 19 26* 36* 37* 28*  CREATININE 1.23* 1.25* 1.26* 1.26* 1.14*  CALCIUM 7.4* 7.8* 7.8* 7.8* 7.6*  MG 1.5* 1.5* 1.9 1.9 1.6*   Estimated Creatinine Clearance: 68.4 mL/min (A) (by C-G formula based on SCr of 1.14 mg/dL (H)). Liver & Pancreas: No results for input(s): AST, ALT, ALKPHOS, BILITOT, PROT, ALBUMIN in the last 168 hours. No results for input(s): LIPASE, AMYLASE in the last 168 hours. No results for input(s): AMMONIA in the last 168 hours. Diabetic: No results for input(s): HGBA1C in the last 72 hours. No results for input(s): GLUCAP in the last 168 hours. Cardiac Enzymes: No results for input(s): CKTOTAL, CKMB, CKMBINDEX, TROPONINI in the last 168 hours. No results  for input(s): PROBNP in the last 8760 hours. Coagulation Profile: No results for input(s): INR, PROTIME in the last 168 hours. Thyroid Function Tests: No results for input(s): TSH, T4TOTAL, FREET4, T3FREE, THYROIDAB in the last 72 hours. Lipid Profile: No results for input(s): CHOL, HDL, LDLCALC, TRIG, CHOLHDL, LDLDIRECT in the last 72 hours. Anemia Panel: No results for input(s): VITAMINB12, FOLATE, FERRITIN, TIBC, IRON, RETICCTPCT in the last 72 hours. Urine analysis:    Component Value Date/Time   COLORURINE AMBER (A) 08/13/2019 0222   APPEARANCEUR CLOUDY (A) 08/13/2019 0222   LABSPEC 1.012 08/13/2019 0222   PHURINE 5.0 08/13/2019 0222   GLUCOSEU 50 (A) 08/13/2019 0222   HGBUR SMALL (A) 08/13/2019 0222   BILIRUBINUR NEGATIVE 08/13/2019 0222   KETONESUR NEGATIVE 08/13/2019 0222   PROTEINUR 100 (A) 08/13/2019 0222   NITRITE NEGATIVE 08/13/2019 0222   LEUKOCYTESUR NEGATIVE 08/13/2019 0222   Sepsis Labs:  Invalid input(s): PROCALCITONIN, [place]  Microbiology: Recent Results (from the past 240 hour(s))  SARS CORONAVIRUS 2 (TAT 6-24 HRS) Nasopharyngeal Nasopharyngeal Swab     Status: None   Collection Time: 08/13/19  3:11 AM   Specimen: Nasopharyngeal Swab  Result Value Ref Range Status   SARS Coronavirus 2 NEGATIVE NEGATIVE Final    Comment: (NOTE) SARS-CoV-2 target nucleic acids are NOT DETECTED. The SARS-CoV-2 RNA is generally detectable in upper and lower respiratory specimens during the acute phase of infection. Negative results do not preclude SARS-CoV-2 infection, do not rule out co-infections with other pathogens, and should not be used as the sole basis for treatment or other patient management decisions. Negative results must be combined with clinical observations, patient history, and epidemiological information. The expected result is Negative. Fact Sheet for Patients: SugarRoll.be Fact Sheet for Healthcare Providers: https://www.woods-mathews.com/ This test is not yet approved or cleared by the [place] FDA and  has been authorized for detection and/or diagnosis of SARS-CoV-2 by FDA under an Emergency Use Authorization (EUA). This EUA will remain  in effect (meaning this test can be used) for the duration of the COVID-19 declaration under Section 56 4(b)(1) of the Act, 21 U.S.C. section 360bbb-3(b)(1), unless the authorization is terminated or revoked sooner. Performed at [facility], [place] [address] [place], [place] [address]   Blood culture (routine x 2)     Status: None   Collection Time: 08/13/19  8:15 AM   Specimen: BLOOD  Result Value Ref Range Status   Specimen Description   Final    BLOOD BLOOD LEFT FOREARM Performed at [facility] [address] [place], [place] [address]    Special Requests   Final    BOTTLES DRAWN AEROBIC AND ANAEROBIC Blood Culture adequate volume  Performed at [facility] [address] [place], [place] [address]    Culture   Final    NO GROWTH 5 DAYS Performed at [facility], [place] [address], [place], [place] [address]    Report Status 08/18/2019 FINAL  Final    Radiology Studies: No results found.  Taye T. Seiling  If 7PM-7AM, please contact night-coverage [email or web address] Password [email or web address] 08/22/2019, 10:18 AM

## 2019-08-23 DIAGNOSIS — I5032 Chronic diastolic (congestive) heart failure: Secondary | ICD-10-CM

## 2019-08-23 DIAGNOSIS — R5381 Other malaise: Secondary | ICD-10-CM

## 2019-08-23 DIAGNOSIS — N189 Chronic kidney disease, unspecified: Secondary | ICD-10-CM

## 2019-08-23 LAB — BASIC METABOLIC PANEL
Anion gap: 7 (ref 5–15)
BUN: 22 mg/dL (ref 8–23)
CO2: 23 mmol/L (ref 22–32)
Calcium: 7.5 mg/dL — ABNORMAL LOW (ref 8.9–10.3)
Chloride: 105 mmol/L (ref 98–111)
Creatinine, Ser: 1.05 mg/dL — ABNORMAL HIGH (ref 0.44–1.00)
GFR calc Af Amer: >60 mL/min (ref >60)
GFR calc non Af Amer: 54 mL/min — ABNORMAL LOW (ref >60)
Glucose, Bld: 168 mg/dL — ABNORMAL HIGH (ref 70–99)
Potassium: 4 mmol/L (ref 3.5–5.1)
Sodium: 135 mmol/L (ref 135–145)

## 2019-08-23 LAB — MAGNESIUM: Magnesium: 1.8 mg/dL (ref 1.7–2.4)

## 2019-08-23 MED ORDER — ACETAMINOPHEN 325 MG PO TABS: 975.0000 mg | ORAL_TABLET | Freq: Three times a day (TID) | ORAL | Status: DC | PRN

## 2019-08-23 MED ORDER — FLUTICASONE PROPIONATE 50 MCG/ACT NA SUSP: 1.0000 | Freq: Every day | NASAL | 2 refills | Status: AC

## 2019-08-23 MED ORDER — LORATADINE 10 MG PO TABS: 10.0000 mg | ORAL_TABLET | Freq: Every day | ORAL | 2 refills | Status: AC

## 2019-08-23 MED ORDER — HEPARIN SOD (PORK) LOCK FLUSH 100 UNIT/ML IV SOLN
500.0000 [IU] | Freq: Once | INTRAVENOUS | Status: AC
  Administered 2019-08-23: 500 [IU] via INTRAVENOUS
  Filled 2019-08-23: qty 5

## 2019-08-23 MED ORDER — SALINE SPRAY 0.65 % NA SOLN: 1.0000 | NASAL | 0 refills | Status: DC | PRN

## 2019-08-23 MED ORDER — FUROSEMIDE 40 MG PO TABS: 40.0000 mg | ORAL_TABLET | Freq: Every day | ORAL | 0 refills | Status: DC | PRN

## 2019-08-23 MED ORDER — LIP MEDEX EX OINT
TOPICAL_OINTMENT | CUTANEOUS | Status: AC
  Administered 2019-08-23: 11:00:00
  Filled 2019-08-23: qty 7

## 2019-08-23 NOTE — TOC Transition Note (Signed)
Transition of Care [facility]) - CM/SW Discharge Note   Patient Details  Name: Suah Haskew MRN: [id number] Date of Birth: [birth year]  Transition of Care [facility]) CM/SW Contact:  Averyanna Sax, Marjie Skiff, RN Phone Number:[phone] 08/23/2019, 9:30 AM   Clinical Narrative:    Pt to dc to [facility] today. Will transport via [facility] once DC summary is complete. Pt to go to room 114 and RN to call report to [phone].   Final next level of care: Skilled Nursing Facility Barriers to Discharge: Barriers Resolved   Patient Goals and CMS Choice Patient states their goals for this hospitalization and ongoing recovery are:: To get stronger to get home. CMS Medicare.gov Compare Post Acute Care list provided to:: Patient Choice offered to / list presented to : Patient   Readmission Risk Interventions Readmission Risk Prevention Plan 08/22/2019  Transportation Screening Complete  PCP or Specialist Appt within 3-5 Days Not Complete  Not Complete comments Not ready  [place] or [place] Not Complete  HRI or Home Care Consult comments NA  Social Work Consult for [place] City Planning/Counseling Not Complete  SW consult not completed comments NA  Palliative Care Screening Not Applicable  Medication Review Press photographer) Complete  Some recent data might be hidden

## 2019-08-23 NOTE — Discharge Summary (Signed)
Physician Discharge Summary  Amber Medina ZSW:[id number] DOB: 04/27/1951 DOA: 08/13/2019  PCP: [name], MD  Admit date: 08/13/2019 Discharge date: 08/23/2019  Admitted From: Home Disposition: SNF  Recommendations for Outpatient Follow-up:  1. Follow up with PCP in 1-2 weeks 2. Oncology to arrange outpatient follow-up. 3. Please obtain CBC/BMP/Mag at follow up 4. Please follow up on the following pending results: None  Home Health: Not applicable Equipment/Devices: Not applicable  Discharge Condition: Stable CODE STATUS: Full code  Contact information for after-discharge care    Destination    [facility] .   Service: Skilled Nursing Contact information: [place] [phone]             Hospital Course: 68 year old female with history of colon cancer status post resection in [facility] but did not receive chemotherapy or radiation, A. fib on Xarelto, diastolic CHF, morbid obesity and CKD-3a was admitted to the hospital on 08/13/2019 with generalized weakness, abdominal pain and distention and appetite loss. CT scan on admission showed multiple liver lesions concerning for metastatic disease as well as peritoneal lesions suspect for carcinomatosis. Oncology and IR were consulted.  She underwent biopsy of abdominal mass on 08/15/2019.  Pathology confirmed metastatic colon cancer to abdominal wall.  She had Port A Cath placed 08/16/2019.  Started chemotherapy on 08/18/2019.   Oncology will arrange outpatient follow-up for further chemotherapy.  She also found to have AKI on CKD-3a.  Home Lasix held with improvement in her renal function.  Patient was evaluated by PT/OT who recommended SNF before she returns home safely due to significant weakness and deconditioning in the setting of acute on chronic illness.   On the day of discharge: No major events overnight or in the morning.  No complaints.  Denies  chest pain, dyspnea, GI or UTI symptoms.  Still with some nasal congestion.  Reports making good amount of urine.  See individual problem list below for more on hospital course.  Discharge Diagnoses:  AKI on CKD-3a with azotemia: Baseline 1.2 in 02/2018.  Likely due to dehydration/poor p.o. intake.  Rehydrated and improved.  About 700 cc urine output charted but reports making good amount of urine. -Creatinine 2.8 (admit)>>> 1.23>>1.05 -Recommend rechecking BMP at follow-up.  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 -Started  chemotherapy on 11/12. -Oncology to arrange outpatient follow-up for further chemotherapy.  Leukocytosis/bandemia: Unclear source.  Could be related to malignancy/chemo.  Afebrile.  Infectious work-up not revealing.  Resolving. -Recheck CBC at follow-up.  Chronic A. Fib: Rate and rhythm controlled -Continue home amiodarone, Cardizem, metoprolol and Xarelto.  Morbid obesity: BMI 49 -Encourage lifestyle change to lose weight.  History of left popliteal vein DVT/PE -Continue home Xarelto.  Chronic diastolic CHF: Stable.  Echo in 5/20 with EF of 55 to 60%, G1 DD.  No signs of fluid overload.  About 700 cc urine output charted in the last 24 hours.  However, patient states making more urine than charted.   Daily weight is incomplete. -We will continue Lasix 40 mg daily as needed for edema, weight gain or shortness of breath. -Daily weight and intake and output -Repeat BMP and magnesium at follow-up -Recommend salt and fluid restriction but patient might not be agreeable to fluid restriction.  Anemia of chronic disease: Stable.  Anemia panel consistent with disease.  H&H stable -  Recheck CBC at follow-up  Generalized weakness/deconditioning: Lives alone.  Ambulates independently at baseline.  Has not been  able to care for herself recently which is a new change for her.  Somewhat fatigued after chemo. -Recommend ongoing PT/OT at SNF  Nasal congestion/sore mouth: no apparent lesion.  Improved. -Saline nose spray, Flonase and Magic mouthwash  Hypomagnesemia: Replenished and resolved. -Recheck at follow-up  Discharge Instructions  Discharge Instructions    Diet - low sodium heart healthy   Complete by: As directed    Increase activity slowly   Complete by: As directed      Allergies as of 08/23/2019      Reactions   Compazine [prochlorperazine Edisylate] Swelling      Medication List    STOP taking these medications   enoxaparin 150 MG/ML injection Commonly known as: LOVENOX   potassium chloride SA 20 MEQ tablet Commonly known as: KLOR-CON     TAKE these medications   acetaminophen 325 MG tablet Commonly known as: TYLENOL Take 3 tablets (975 mg total) by mouth every 8 (eight) hours as needed for mild pain or moderate pain (or Fever >/= 101).   amiodarone 200 MG tablet Commonly known as: PACERONE TAKE 1 TABLET BY MOUTH DAILY. Please make overdue appt with Dr. Radford Pax before anymore refills. 2nd attempt What changed:   how much to take  how to take this  when to take this  additional instructions   diltiazem 240 MG 24 hr capsule Commonly known as: Cartia XT Take 1 capsule (240 mg total) by mouth daily. Please make overdue annual appt for future refills. [phone]. 2nd attempt. What changed: additional instructions   ferrous gluconate 324 MG tablet Commonly known as: FERGON Take 1 tablet (324 mg total) by mouth 3 (three) times daily with meals.   fluticasone 50 MCG/ACT nasal spray Commonly known as: FLONASE Place 1 spray into both nostrils daily.   furosemide 40 MG tablet Commonly known as: LASIX Take 1 tablet (40 mg total) by mouth daily as needed for fluid or edema (sob). What changed:   when to take this  reasons to take this   loratadine 10 MG  tablet Commonly known as: Claritin Take 1 tablet (10 mg total) by mouth daily.   metoprolol succinate 25 MG 24 hr tablet Commonly known as: TOPROL-XL TAKE 1 TABLET BY MOUTH EVERY DAY. PLEASE MAKE AN APPOINTMENT WITH DOCTOR TURNER FOR FURHTER REFILLS. What changed: See the new instructions.   sodium chloride 0.65 % Soln nasal spray Commonly known as: OCEAN Place 1 spray into both nostrils as needed for congestion.   Xarelto 20 MG Tabs tablet Generic drug: rivaroxaban Take 20 mg by mouth daily with supper.       Consultations:  Oncology  Procedures/Studies:  2D Echo: None obtained this admission  Port-a-cath placement  Chemotherapy  Ct Abdomen Pelvis Wo Contrast  Result Date: 08/13/2019 CLINICAL DATA:  Abdominal distension.  History of colon cancer. EXAM: CT ABDOMEN AND PELVIS WITHOUT CONTRAST TECHNIQUE: Multidetector CT imaging of the abdomen and pelvis was performed following the standard protocol without IV contrast. COMPARISON:  02/09/2018 FINDINGS: Lower chest: Lung bases are normal. Hepatobiliary: There are multiple new ill-defined hypodense liver masses with significant nodular enlargement of the entire left lobe of the liver. Findings suggest metastatic disease in this patient with known history of colon cancer. Biliary tree is normal. Cholelithiasis is present. Pancreas: Normal. Spleen: Normal. Adrenals/Urinary Tract: Adrenal glands demonstrates small stable bilateral nodules. Kidneys are normal in size without  hydronephrosis or nephrolithiasis. Ureters and bladder are normal. Stomach/Bowel: Stomach and small bowel are normal. Mild diverticulosis of the colon. Findings suggest previous partial right colectomy. Vascular/Lymphatic: Mild calcified plaque over the abdominal aorta which is normal in caliber. No significant adenopathy. Reproductive: Possible small right-sided fibroid over the fundus unchanged. Ovaries unremarkable. Other: Multiple peritoneal masses over the left  mid abdomen with the largest measuring 3.1 cm compatible with metastatic disease. Musculoskeletal: Degenerative change of the spine with significant disc disease at the L4-5 level. IMPRESSION: 1.  No acute findings in the abdomen/pelvis. 2. Interval development of multiple ill-defined hypodense liver masses with significant nodular enlargement of the entire left lobe of the liver suggesting metastatic disease in this patient with history of colon cancer. Multiple new peritoneal masses compatible with metastatic disease. 3.  Aortic Atherosclerosis (ICD10-I70.0). 4.  Colonic diverticulosis. Electronically Signed   By: Marin Olp M.D.   On: 08/13/2019 07:55   Ct Biopsy  Result Date: 08/15/2019 CLINICAL DATA:  History of colon cancer in 2019 now presenting with multiple hepatic masses as well as a left lower quadrant peritoneal mass. EXAM: CT GUIDED CORE BIOPSY OF PERITONEAL ABDOMINAL MASS ANESTHESIA/SEDATION: 2.0 mg IV Versed; 100 mcg IV Fentanyl Total Moderate Sedation Time:  10 minutes. The patient's level of consciousness and physiologic status were continuously monitored during the procedure by Radiology nursing. PROCEDURE: The procedure risks, benefits, and alternatives were explained to the patient. Questions regarding the procedure were encouraged and answered. The patient understands and consents to the procedure. CT was performed through the lower abdomen and upper pelvis in a supine position. The left abdominal wall was prepped with chlorhexidine in a sterile fashion, and a sterile drape was applied covering the operative field. A sterile gown and sterile gloves were used for the procedure. Local anesthesia was provided with 1% Lidocaine. A 17 gauge trocar needle was advanced under CT guidance to the level of a peritoneal abdominal mass. After confirming needle tip position, coaxial 18 gauge core biopsy samples were obtained. Three samples were obtained and submitted in formalin. COMPLICATIONS: None  FINDINGS: Lobulated soft tissue mass of the peritoneal cavity just deep to the left abdominal wall in the left lower quadrant measures approximately 3.1 x 3.7 cm. Solid tissue was obtained. IMPRESSION: CT-guided core biopsy performed of left peritoneal abdominal mass. Electronically Signed   By: Aletta Edouard M.D.   On: 08/15/2019 17:30   Dg Chest Portable 1 View  Result Date: 08/13/2019 CLINICAL DATA:  Cough EXAM: PORTABLE CHEST 1 VIEW COMPARISON:  Feb 12, 2018 FINDINGS: There is mild cardiomegaly. Aortic knob calcifications are seen. Both lungs are clear. The visualized skeletal structures are unremarkable. IMPRESSION: No active disease. Electronically Signed   By: Prudencio Pair M.D.   On: 08/13/2019 03:44   Ir Imaging Guided Port Insertion  Result Date: 08/16/2019 INDICATION: History of metastatic colon cancer. In need of durable intravenous access for chemotherapy administration EXAM: IMPLANTED PORT A CATH PLACEMENT WITH ULTRASOUND AND FLUOROSCOPIC GUIDANCE COMPARISON:  Chest CT-02/06/2018 MEDICATIONS: Ancef 3 gm IV; The antibiotic was administered within an appropriate time interval prior to skin puncture. ANESTHESIA/SEDATION: Moderate (conscious) sedation was employed during this procedure. A total of Versed 2 mg and Fentanyl 100 mcg was administered intravenously. Moderate Sedation Time: 28 minutes. The patient's level of consciousness and vital signs were monitored continuously by radiology nursing throughout the procedure under my direct supervision. CONTRAST:  None FLUOROSCOPY TIME:  54 seconds (43 mGy) COMPLICATIONS: None immediate. PROCEDURE: The procedure, risks, benefits,  and alternatives were explained to the patient. Questions regarding the procedure were encouraged and answered. The patient understands and consents to the procedure. The right neck and chest were prepped with chlorhexidine in a sterile fashion, and a sterile drape was applied covering the operative field. Maximum barrier  sterile technique with sterile gowns and gloves were used for the procedure. A timeout was performed prior to the initiation of the procedure. Local anesthesia was provided with 1% lidocaine with epinephrine. After creating a small venotomy incision, a micropuncture kit was utilized to access the internal jugular vein. Real-time ultrasound guidance was utilized for vascular access including the acquisition of a permanent ultrasound image documenting patency of the accessed vessel. The microwire was utilized to measure appropriate catheter length. A subcutaneous port pocket was then created along the upper chest wall utilizing a combination of sharp and blunt dissection. The pocket was irrigated with sterile saline. A single lumen standard sized power injectable port was chosen for placement. The 8 Fr catheter was tunneled from the port pocket site to the venotomy incision. The port was placed in the pocket. The external catheter was trimmed to appropriate length. At the venotomy, an 8 Fr peel-away sheath was placed over a guidewire under fluoroscopic guidance. The catheter was then placed through the sheath and the sheath was removed. Final catheter positioning was confirmed and documented with a fluoroscopic spot radiograph. The port was accessed with a Huber needle, aspirated and flushed with heparinized saline. The venotomy site was closed with an interrupted 4-0 Vicryl suture. The port pocket incision was closed with interrupted 2-0 Vicryl suture and the skin was opposed with a running subcuticular 4-0 Vicryl suture. Dermabond and Steri-strips were applied to both incisions. Dressings were placed. The patient tolerated the procedure well without immediate post procedural complication. FINDINGS: After catheter placement, the tip lies within the superior cavoatrial junction. The catheter aspirates and flushes normally and is ready for immediate use. IMPRESSION: Successful placement of a right internal jugular  approach power injectable Port-A-Cath. The catheter is ready for immediate use. Electronically Signed   By: Sandi Mariscal M.D.   On: 08/16/2019 17:28      Discharge Exam: Vitals:   08/22/19 1942 08/23/19 0610  BP: (!) 101/58 116/63  Pulse: 73 74  Resp: 18 16  Temp: 98.3 F (36.8 C) 99.6 F (37.6 C)  SpO2: 95% 94%    GENERAL: No acute distress.  Appears well.  HEENT: MMM.  Vision and hearing grossly intact.  NECK: Supple.  No apparent JVD but difficult exam due to body habitus.  RESP:  No IWOB.  Fair air movement bilaterally CVS:  RRR. Heart sounds normal.  ABD/GI/GU: Bowel sounds present. Soft.  Some discomfort but no tenderness MSK/EXT:  Moves extremities. No apparent deformity or edema.  SKIN: no apparent skin lesion or wound NEURO: Awake, alert and oriented appropriately.  No gross deficit.  PSYCH: Calm. Normal affect.   The results of significant diagnostics from this hospitalization (including imaging, microbiology, ancillary and laboratory) are listed below for reference.     Microbiology: Recent Results (from the past 240 hour(s))  SARS CORONAVIRUS 2 (TAT 6-24 HRS) Nasopharyngeal Nasopharyngeal Swab     Status: None   Collection Time: 08/22/19 11:13 AM   Specimen: Nasopharyngeal Swab  Result Value Ref Range Status   SARS Coronavirus 2 NEGATIVE NEGATIVE Final    Comment: (NOTE) SARS-CoV-2 target nucleic acids are NOT DETECTED. The SARS-CoV-2 RNA is generally detectable in upper and lower respiratory specimens  during the acute phase of infection. Negative results do not preclude SARS-CoV-2 infection, do not rule out co-infections with other pathogens, and should not be used as the sole basis for treatment or other patient management decisions. Negative results must be combined with clinical observations, patient history, and epidemiological information. The expected result is Negative. Fact Sheet for Patients: SugarRoll.be Fact Sheet  for Healthcare Providers: https://www.woods-mathews.com/ This test is not yet approved or cleared by the [place] FDA and  has been authorized for detection and/or diagnosis of SARS-CoV-2 by FDA under an Emergency Use Authorization (EUA). This EUA will remain  in effect (meaning this test can be used) for the duration of the COVID-19 declaration under Section 56 4(b)(1) of the Act, 21 U.S.C. section 360bbb-3(b)(1), unless the authorization is terminated or revoked sooner. Performed at [facility], [place] [address] [place], [place] [address]      Labs: BNP (last 3 results) Recent Labs    08/13/19 0222  BNP 35.5   Basic Metabolic Panel: Recent Labs  Lab 08/19/19 0450 08/20/19 0758 08/21/19 0600 08/22/19 0549 08/23/19 0515  NA 137 141 137 136 135  K 4.3 5.1 4.6 4.0 4.0  CL 106 110 106 105 105  CO2 21* _0 GLUCOSE 219* 151* 152* 170* 168*  BUN 26* 36* 37* 28* 22  CREATININE 1.25* 1.26* 1.26* 1.14* 1.05*  CALCIUM 7.8* 7.8* 7.8* 7.6* 7.5*  MG 1.5* 1.9 1.9 1.6* 1.8   Liver Function Tests: No results for input(s): AST, ALT, ALKPHOS, BILITOT, PROT, ALBUMIN in the last 168 hours. No results for input(s): LIPASE, AMYLASE in the last 168 hours. No results for input(s): AMMONIA in the last 168 hours. CBC: Recent Labs  Lab 08/17/19 1513 08/18/19 0459 08/19/19 0450 08/20/19 0758 08/21/19 0600 08/22/19 0549  WBC 16.3* 16.6* 22.8* 17.2* 10.5 12.6*  NEUTROABS 13.7*  --   --   --   --   --   HGB 11.0* 10.7* 10.1* 10.2* 9.8* 9.1*  HCT 36.9 35.8* 34.3* 34.5* 33.3* 30.4*  MCV 89.3 88.6 90.7 90.6 90.2 89.1  PLT 332 255 277 318 272 212   Cardiac Enzymes: No results for input(s): CKTOTAL, CKMB, CKMBINDEX, TROPONINI in the last 168 hours. BNP: Invalid input(s): POCBNP CBG: No results for input(s): GLUCAP in the last 168 hours. D-Dimer No results for input(s): DDIMER in the last 72 hours. Hgb A1c No results for input(s): HGBA1C in the last 72  hours. Lipid Profile No results for input(s): CHOL, HDL, LDLCALC, TRIG, CHOLHDL, LDLDIRECT in the last 72 hours. Thyroid function studies No results for input(s): TSH, T4TOTAL, T3FREE, THYROIDAB in the last 72 hours.  Invalid input(s): FREET3 Anemia work up No results for input(s): VITAMINB12, FOLATE, FERRITIN, TIBC, IRON, RETICCTPCT in the last 72 hours. Urinalysis    Component Value Date/Time   COLORURINE AMBER (A) 08/13/2019 0222   APPEARANCEUR CLOUDY (A) 08/13/2019 0222   LABSPEC 1.012 08/13/2019 0222   PHURINE 5.0 08/13/2019 0222   GLUCOSEU 50 (A) 08/13/2019 0222   HGBUR SMALL (A) 08/13/2019 0222   BILIRUBINUR NEGATIVE 08/13/2019 0222   KETONESUR NEGATIVE 08/13/2019 0222   PROTEINUR 100 (A) 08/13/2019 0222   NITRITE NEGATIVE 08/13/2019 0222   LEUKOCYTESUR NEGATIVE 08/13/2019 0222   Sepsis Labs Invalid input(s): PROCALCITONIN,  WBC,  LACTICIDVEN   Time coordinating discharge: 35 minutes  SIGNED:  Mercy Riding, MD  [facility] 08/23/2019, 10:12 AM  If 7PM-7AM, please contact night-coverage [email or web address] Password [email or web address]

## 2019-08-28 ENCOUNTER — Other Ambulatory Visit: Payer: Self-pay | Admitting: Oncology

## 2019-08-29 ENCOUNTER — Other Ambulatory Visit: Payer: Self-pay | Admitting: *Deleted

## 2019-08-29 DIAGNOSIS — C189 Malignant neoplasm of colon, unspecified: Secondary | ICD-10-CM

## 2019-08-30 ENCOUNTER — Inpatient Hospital Stay: Payer: Medicare HMO

## 2019-08-30 ENCOUNTER — Other Ambulatory Visit: Payer: Self-pay

## 2019-08-30 ENCOUNTER — Inpatient Hospital Stay (HOSPITAL_BASED_OUTPATIENT_CLINIC_OR_DEPARTMENT_OTHER): Payer: Medicare HMO | Admitting: Oncology

## 2019-08-30 ENCOUNTER — Telehealth: Payer: Self-pay | Admitting: Oncology

## 2019-08-30 ENCOUNTER — Inpatient Hospital Stay: Payer: Medicare HMO | Attending: Oncology

## 2019-08-30 VITALS — BP 129/77 | HR 101 | Temp 98.0°F | Resp 18 | Ht 67.0 in | Wt 315.4 lb

## 2019-08-30 VITALS — HR 98

## 2019-08-30 DIAGNOSIS — Z95828 Presence of other vascular implants and grafts: Secondary | ICD-10-CM

## 2019-08-30 DIAGNOSIS — E669 Obesity, unspecified: Secondary | ICD-10-CM | POA: Diagnosis not present

## 2019-08-30 DIAGNOSIS — I4891 Unspecified atrial fibrillation: Secondary | ICD-10-CM | POA: Diagnosis not present

## 2019-08-30 DIAGNOSIS — Z86711 Personal history of pulmonary embolism: Secondary | ICD-10-CM | POA: Insufficient documentation

## 2019-08-30 DIAGNOSIS — D509 Iron deficiency anemia, unspecified: Secondary | ICD-10-CM | POA: Insufficient documentation

## 2019-08-30 DIAGNOSIS — C189 Malignant neoplasm of colon, unspecified: Secondary | ICD-10-CM | POA: Diagnosis not present

## 2019-08-30 DIAGNOSIS — I509 Heart failure, unspecified: Secondary | ICD-10-CM | POA: Diagnosis not present

## 2019-08-30 DIAGNOSIS — I10 Essential (primary) hypertension: Secondary | ICD-10-CM | POA: Diagnosis not present

## 2019-08-30 DIAGNOSIS — Z5111 Encounter for antineoplastic chemotherapy: Secondary | ICD-10-CM | POA: Diagnosis present

## 2019-08-30 DIAGNOSIS — Z86718 Personal history of other venous thrombosis and embolism: Secondary | ICD-10-CM | POA: Insufficient documentation

## 2019-08-30 DIAGNOSIS — C182 Malignant neoplasm of ascending colon: Secondary | ICD-10-CM | POA: Insufficient documentation

## 2019-08-30 LAB — CMP (CANCER CENTER ONLY)
ALT: 18 U/L (ref 0–44)
AST: 40 U/L (ref 15–41)
Albumin: 1.5 g/dL — ABNORMAL LOW (ref 3.5–5.0)
Alkaline Phosphatase: 226 U/L — ABNORMAL HIGH (ref 38–126)
Anion gap: 11 (ref 5–15)
BUN: 13 mg/dL (ref 8–23)
CO2: 24 mmol/L (ref 22–32)
Calcium: 8.2 mg/dL — ABNORMAL LOW (ref 8.9–10.3)
Chloride: 105 mmol/L (ref 98–111)
Creatinine: 1.02 mg/dL — ABNORMAL HIGH (ref 0.44–1.00)
GFR, Est AFR Am: >60 mL/min (ref >60)
GFR, Estimated: 56 mL/min — ABNORMAL LOW (ref >60)
Glucose, Bld: 148 mg/dL — ABNORMAL HIGH (ref 70–99)
Potassium: 4 mmol/L (ref 3.5–5.1)
Sodium: 140 mmol/L (ref 135–145)
Total Bilirubin: 0.8 mg/dL (ref 0.3–1.2)
Total Protein: 5.9 g/dL — ABNORMAL LOW (ref 6.5–8.1)

## 2019-08-30 LAB — CBC WITH DIFFERENTIAL (CANCER CENTER ONLY)
Abs Immature Granulocytes: 0.03 10*3/uL (ref 0.00–0.07)
Basophils Absolute: 0 10*3/uL (ref 0.0–0.1)
Basophils Relative: 0 %
Eosinophils Absolute: 0 10*3/uL (ref 0.0–0.5)
Eosinophils Relative: 0 %
HCT: 31.8 % — ABNORMAL LOW (ref 36.0–46.0)
Hemoglobin: 9.4 g/dL — ABNORMAL LOW (ref 12.0–15.0)
Immature Granulocytes: 1 %
Lymphocytes Relative: 13 %
Lymphs Abs: 0.9 10*3/uL (ref 0.7–4.0)
MCH: 25.9 pg — ABNORMAL LOW (ref 26.0–34.0)
MCHC: 29.6 g/dL — ABNORMAL LOW (ref 30.0–36.0)
MCV: 87.6 fL (ref 80.0–100.0)
Monocytes Absolute: 1.1 10*3/uL — ABNORMAL HIGH (ref 0.1–1.0)
Monocytes Relative: 17 %
Neutro Abs: 4.4 10*3/uL (ref 1.7–7.7)
Neutrophils Relative %: 69 %
Platelet Count: 278 10*3/uL (ref 150–400)
RBC: 3.63 MIL/uL — ABNORMAL LOW (ref 3.87–5.11)
RDW: 17.5 % — ABNORMAL HIGH (ref 11.5–15.5)
WBC Count: 6.5 10*3/uL (ref 4.0–10.5)
nRBC: 0 % (ref 0.0–0.2)

## 2019-08-30 MED ORDER — PALONOSETRON HCL INJECTION 0.25 MG/5ML
0.2500 mg | Freq: Once | INTRAVENOUS | Status: AC
  Administered 2019-08-30: 0.25 mg via INTRAVENOUS

## 2019-08-30 MED ORDER — DEXTROSE 5 % IV SOLN
Freq: Once | INTRAVENOUS | Status: AC
  Administered 2019-08-30: 10:00:00 via INTRAVENOUS
  Filled 2019-08-30: qty 250

## 2019-08-30 MED ORDER — MAGIC MOUTHWASH: 5.0000 mL | Freq: Four times a day (QID) | ORAL | 0 refills | Status: AC | PRN

## 2019-08-30 MED ORDER — OXALIPLATIN CHEMO INJECTION 100 MG/20ML
65.0000 mg/m2 | Freq: Once | INTRAVENOUS | Status: AC
  Administered 2019-08-30: 170 mg via INTRAVENOUS
  Filled 2019-08-30: qty 34

## 2019-08-30 MED ORDER — LIDOCAINE-PRILOCAINE 2.5-2.5 % EX CREA: 1.0000 "application " | TOPICAL_CREAM | CUTANEOUS | 0 refills | Status: AC

## 2019-08-30 MED ORDER — SODIUM CHLORIDE 0.9 % IV SOLN
1600.0000 mg/m2 | INTRAVENOUS | Status: DC
  Administered 2019-08-30: 4150 mg via INTRAVENOUS
  Filled 2019-08-30: qty 83

## 2019-08-30 MED ORDER — PALONOSETRON HCL INJECTION 0.25 MG/5ML
INTRAVENOUS | Status: AC
  Filled 2019-08-30: qty 5

## 2019-08-30 MED ORDER — DEXAMETHASONE SODIUM PHOSPHATE 10 MG/ML IJ SOLN
10.0000 mg | Freq: Once | INTRAMUSCULAR | Status: AC
  Administered 2019-08-30: 10 mg via INTRAVENOUS

## 2019-08-30 MED ORDER — DEXAMETHASONE SODIUM PHOSPHATE 10 MG/ML IJ SOLN
INTRAMUSCULAR | Status: AC
  Filled 2019-08-30: qty 1

## 2019-08-30 MED ORDER — ACETAMINOPHEN 325 MG PO TABS
ORAL_TABLET | ORAL | Status: AC
  Filled 2019-08-30: qty 2

## 2019-08-30 MED ORDER — ACETAMINOPHEN 325 MG PO TABS
650.0000 mg | ORAL_TABLET | Freq: Once | ORAL | Status: AC
  Administered 2019-08-30: 650 mg via ORAL

## 2019-08-30 MED ORDER — SODIUM CHLORIDE 0.9% FLUSH
10.0000 mL | INTRAVENOUS | Status: DC | PRN
  Administered 2019-08-30: 10 mL via INTRAVENOUS
  Filled 2019-08-30: qty 10

## 2019-08-30 MED ORDER — ONDANSETRON HCL 8 MG PO TABS: 8.0000 mg | ORAL_TABLET | Freq: Three times a day (TID) | ORAL | 0 refills | Status: AC | PRN

## 2019-08-30 MED ORDER — LEUCOVORIN CALCIUM INJECTION 350 MG
300.0000 mg/m2 | Freq: Once | INTRAVENOUS | Status: AC
  Administered 2019-08-30: 778 mg via INTRAVENOUS
  Filled 2019-08-30: qty 38.9

## 2019-08-30 NOTE — Telephone Encounter (Signed)
Scheduled appt per 11/24 los - per RN Manuela Schwartz to give pt print out before she leaves [facility] today

## 2019-08-30 NOTE — Patient Instructions (Signed)
See attached f/u appointments and new meds listed on pink sheet for SNF for" EMLA cream MMW 5 ml qid prn Zofran 8 mg prn

## 2019-08-30 NOTE — Patient Instructions (Addendum)
[facility] Discharge Instructions for Patients Receiving Chemotherapy  The following scripts have been noted on your discharge paperwork for SNF:  EMLA cream-apply to port 1 hour prior to stick and cover with plastic wrap Zofran 8 mg every 8 hours as needed for nausea (start 72 hours after chemo infusion) Magic Mouthwash-5 ml swish and spit four times daily as needed for mouth sores or tenderness  Today you received the following chemotherapy agents oxaliplatin, leucovorin, fluoroucil  To help prevent nausea and vomiting after your treatment, we encourage you to take your nausea medication as prescribed.   If you develop nausea and vomiting that is not controlled by your nausea medication, call the clinic.   BELOW ARE SYMPTOMS THAT SHOULD BE REPORTED IMMEDIATELY:  *FEVER GREATER THAN 100.5 F  *CHILLS WITH OR WITHOUT FEVER  NAUSEA AND VOMITING THAT IS NOT CONTROLLED WITH YOUR NAUSEA MEDICATION  *UNUSUAL SHORTNESS OF BREATH  *UNUSUAL BRUISING OR BLEEDING  TENDERNESS IN MOUTH AND THROAT WITH OR WITHOUT PRESENCE OF ULCERS  *URINARY PROBLEMS  *BOWEL PROBLEMS  UNUSUAL RASH Items with * indicate a potential emergency and should be followed up as soon as possible.  Feel free to call the clinic should you have any questions or concerns. The clinic phone number is [phone].  Please show the [facility] at check-in to the Emergency Department and triage nurse.  Oxaliplatin Injection What is this medicine? OXALIPLATIN (ox AL i PLA tin) is a chemotherapy drug. It targets fast dividing cells, like cancer cells, and causes these cells to die. This medicine is used to treat cancers of the colon and rectum, and many other cancers. This medicine may be used for other purposes; ask your health care provider or pharmacist if you have questions. COMMON BRAND NAME(S): Eloxatin What should I tell my health care provider before I take this medicine? They need to know  if you have any of these conditions:  kidney disease  an unusual or allergic reaction to oxaliplatin, other chemotherapy, other medicines, foods, dyes, or preservatives  pregnant or trying to get pregnant  breast-feeding How should I use this medicine? This drug is given as an infusion into a vein. It is administered in a hospital or clinic by a specially trained health care professional. Talk to your pediatrician regarding the use of this medicine in children. Special care may be needed. Overdosage: If you think you have taken too much of this medicine contact a poison control center or emergency room at once. NOTE: This medicine is only for you. Do not share this medicine with others. What if I miss a dose? It is important not to miss a dose. Call your doctor or health care professional if you are unable to keep an appointment. What may interact with this medicine?  medicines to increase blood counts like filgrastim, pegfilgrastim, sargramostim  probenecid  some antibiotics like amikacin, gentamicin, neomycin, polymyxin B, streptomycin, tobramycin  zalcitabine Talk to your doctor or health care professional before taking any of these medicines:  acetaminophen  aspirin  ibuprofen  ketoprofen  naproxen This list may not describe all possible interactions. Give your health care provider a list of all the medicines, herbs, non-prescription drugs, or dietary supplements you use. Also tell them if you smoke, drink alcohol, or use illegal drugs. Some items may interact with your medicine. What should I watch for while using this medicine? Your condition will be monitored carefully while you are receiving this medicine. You will need important  blood work done while you are taking this medicine. This medicine can make you more sensitive to cold. Do not drink cold drinks or use ice. Cover exposed skin before coming in contact with cold temperatures or cold objects. When out in cold  weather wear warm clothing and cover your mouth and nose to warm the air that goes into your lungs. Tell your doctor if you get sensitive to the cold. This drug may make you feel generally unwell. This is not uncommon, as chemotherapy can affect healthy cells as well as cancer cells. Report any side effects. Continue your course of treatment even though you feel ill unless your doctor tells you to stop. In some cases, you may be given additional medicines to help with side effects. Follow all directions for their use. Call your doctor or health care professional for advice if you get a fever, chills or sore throat, or other symptoms of a cold or flu. Do not treat yourself. This drug decreases your body's ability to fight infections. Try to avoid being around people who are sick. This medicine may increase your risk to bruise or bleed. Call your doctor or health care professional if you notice any unusual bleeding. Be careful brushing and flossing your teeth or using a toothpick because you may get an infection or bleed more easily. If you have any dental work done, tell your dentist you are receiving this medicine. Avoid taking products that contain aspirin, acetaminophen, ibuprofen, naproxen, or ketoprofen unless instructed by your doctor. These medicines may hide a fever. Do not become pregnant while taking this medicine. Women should inform their doctor if they wish to become pregnant or think they might be pregnant. There is a potential for serious side effects to an unborn child. Talk to your health care professional or pharmacist for more information. Do not breast-feed an infant while taking this medicine. Call your doctor or health care professional if you get diarrhea. Do not treat yourself. What side effects may I notice from receiving this medicine? Side effects that you should report to your doctor or health care professional as soon as possible:  allergic reactions like skin rash, itching or  hives, swelling of the face, lips, or tongue  low blood counts - This drug may decrease the number of white blood cells, red blood cells and platelets. You may be at increased risk for infections and bleeding.  signs of infection - fever or chills, cough, sore throat, pain or difficulty passing urine  signs of decreased platelets or bleeding - bruising, pinpoint red spots on the skin, black, tarry stools, nosebleeds  signs of decreased red blood cells - unusually weak or tired, fainting spells, lightheadedness  breathing problems  chest pain, pressure  cough  diarrhea  jaw tightness  mouth sores  nausea and vomiting  pain, swelling, redness or irritation at the injection site  pain, tingling, numbness in the hands or feet  problems with balance, talking, walking  redness, blistering, peeling or loosening of the skin, including inside the mouth  trouble passing urine or change in the amount of urine Side effects that usually do not require medical attention (report to your doctor or health care professional if they continue or are bothersome):  changes in vision  constipation  hair loss  loss of appetite  metallic taste in the mouth or changes in taste  stomach pain This list may not describe all possible side effects. Call your doctor for medical advice about side effects. You  may report side effects to FDA at 1-800-FDA-1088. Where should I keep my medicine? This drug is given in a hospital or clinic and will not be stored at home. NOTE: This sheet is a summary. It may not cover all possible information. If you have questions about this medicine, talk to your doctor, pharmacist, or health care provider.  2020 Elsevier/Gold Standard (2008-04-18 17:22:47)  Leucovorin injection What is this medicine? LEUCOVORIN (loo koe VOR in) is used to prevent or treat the harmful effects of some medicines. This medicine is used to treat anemia caused by a low amount of folic  acid in the body. It is also used with 5-fluorouracil (5-FU) to treat colon cancer. This medicine may be used for other purposes; ask your health care provider or pharmacist if you have questions. What should I tell my health care provider before I take this medicine? They need to know if you have any of these conditions:  anemia from low levels of vitamin B-12 in the blood  an unusual or allergic reaction to leucovorin, folic acid, other medicines, foods, dyes, or preservatives  pregnant or trying to get pregnant  breast-feeding How should I use this medicine? This medicine is for injection into a muscle or into a vein. It is given by a health care professional in a hospital or clinic setting. Talk to your pediatrician regarding the use of this medicine in children. Special care may be needed. Overdosage: If you think you have taken too much of this medicine contact a poison control center or emergency room at once. NOTE: This medicine is only for you. Do not share this medicine with others. What if I miss a dose? This does not apply. What may interact with this medicine?  capecitabine  fluorouracil  phenobarbital  phenytoin  primidone  trimethoprim-sulfamethoxazole This list may not describe all possible interactions. Give your health care provider a list of all the medicines, herbs, non-prescription drugs, or dietary supplements you use. Also tell them if you smoke, drink alcohol, or use illegal drugs. Some items may interact with your medicine. What should I watch for while using this medicine? Your condition will be monitored carefully while you are receiving this medicine. This medicine may increase the side effects of 5-fluorouracil, 5-FU. Tell your doctor or health care professional if you have diarrhea or mouth sores that do not get better or that get worse. What side effects may I notice from receiving this medicine? Side effects that you should report to your doctor or  health care professional as soon as possible:  allergic reactions like skin rash, itching or hives, swelling of the face, lips, or tongue  breathing problems  fever, infection  mouth sores  unusual bleeding or bruising  unusually weak or tired Side effects that usually do not require medical attention (report to your doctor or health care professional if they continue or are bothersome):  constipation or diarrhea  loss of appetite  nausea, vomiting This list may not describe all possible side effects. Call your doctor for medical advice about side effects. You may report side effects to FDA at 1-800-FDA-1088. Where should I keep my medicine? This drug is given in a hospital or clinic and will not be stored at home. NOTE: This sheet is a summary. It may not cover all possible information. If you have questions about this medicine, talk to your doctor, pharmacist, or health care provider.  2020 Elsevier/Gold Standard (2008-03-28 16:50:29)  Fluorouracil, 5-FU injection What is this medicine?  FLUOROURACIL, 5-FU (flure oh YOOR a sil) is a chemotherapy drug. It slows the growth of cancer cells. This medicine is used to treat many types of cancer like breast cancer, colon or rectal cancer, pancreatic cancer, and stomach cancer. This medicine may be used for other purposes; ask your health care provider or pharmacist if you have questions. COMMON BRAND NAME(S): Adrucil What should I tell my health care provider before I take this medicine? They need to know if you have any of these conditions:  blood disorders  dihydropyrimidine dehydrogenase (DPD) deficiency  infection (especially a virus infection such as chickenpox, cold sores, or herpes)  kidney disease  liver disease  malnourished, poor nutrition  recent or ongoing radiation therapy  an unusual or allergic reaction to fluorouracil, other chemotherapy, other medicines, foods, dyes, or preservatives  pregnant or trying  to get pregnant  breast-feeding How should I use this medicine? This drug is given as an infusion or injection into a vein. It is administered in a hospital or clinic by a specially trained health care professional. Talk to your pediatrician regarding the use of this medicine in children. Special care may be needed. Overdosage: If you think you have taken too much of this medicine contact a poison control center or emergency room at once. NOTE: This medicine is only for you. Do not share this medicine with others. What if I miss a dose? It is important not to miss your dose. Call your doctor or health care professional if you are unable to keep an appointment. What may interact with this medicine?  allopurinol  cimetidine  dapsone  digoxin  hydroxyurea  leucovorin  levamisole  medicines for seizures like ethotoin, fosphenytoin, phenytoin  medicines to increase blood counts like filgrastim, pegfilgrastim, sargramostim  medicines that treat or prevent blood clots like warfarin, enoxaparin, and dalteparin  methotrexate  metronidazole  pyrimethamine  some other chemotherapy drugs like busulfan, cisplatin, estramustine, vinblastine  trimethoprim  trimetrexate  vaccines Talk to your doctor or health care professional before taking any of these medicines:  acetaminophen  aspirin  ibuprofen  ketoprofen  naproxen This list may not describe all possible interactions. Give your health care provider a list of all the medicines, herbs, non-prescription drugs, or dietary supplements you use. Also tell them if you smoke, drink alcohol, or use illegal drugs. Some items may interact with your medicine. What should I watch for while using this medicine? Visit your doctor for checks on your progress. This drug may make you feel generally unwell. This is not uncommon, as chemotherapy can affect healthy cells as well as cancer cells. Report any side effects. Continue your course  of treatment even though you feel ill unless your doctor tells you to stop. In some cases, you may be given additional medicines to help with side effects. Follow all directions for their use. Call your doctor or health care professional for advice if you get a fever, chills or sore throat, or other symptoms of a cold or flu. Do not treat yourself. This drug decreases your body's ability to fight infections. Try to avoid being around people who are sick. This medicine may increase your risk to bruise or bleed. Call your doctor or health care professional if you notice any unusual bleeding. Be careful brushing and flossing your teeth or using a toothpick because you may get an infection or bleed more easily. If you have any dental work done, tell your dentist you are receiving this medicine. Avoid taking products  that contain aspirin, acetaminophen, ibuprofen, naproxen, or ketoprofen unless instructed by your doctor. These medicines may hide a fever. Do not become pregnant while taking this medicine. Women should inform their doctor if they wish to become pregnant or think they might be pregnant. There is a potential for serious side effects to an unborn child. Talk to your health care professional or pharmacist for more information. Do not breast-feed an infant while taking this medicine. Men should inform their doctor if they wish to father a child. This medicine may lower sperm counts. Do not treat diarrhea with over the counter products. Contact your doctor if you have diarrhea that lasts more than 2 days or if it is severe and watery. This medicine can make you more sensitive to the sun. Keep out of the sun. If you cannot avoid being in the sun, wear protective clothing and use sunscreen. Do not use sun lamps or tanning beds/booths. What side effects may I notice from receiving this medicine? Side effects that you should report to your doctor or health care professional as soon as  possible:  allergic reactions like skin rash, itching or hives, swelling of the face, lips, or tongue  low blood counts - this medicine may decrease the number of white blood cells, red blood cells and platelets. You may be at increased risk for infections and bleeding.  signs of infection - fever or chills, cough, sore throat, pain or difficulty passing urine  signs of decreased platelets or bleeding - bruising, pinpoint red spots on the skin, black, tarry stools, blood in the urine  signs of decreased red blood cells - unusually weak or tired, fainting spells, lightheadedness  breathing problems  changes in vision  chest pain  mouth sores  nausea and vomiting  pain, swelling, redness at site where injected  pain, tingling, numbness in the hands or feet  redness, swelling, or sores on hands or feet  stomach pain  unusual bleeding Side effects that usually do not require medical attention (report to your doctor or health care professional if they continue or are bothersome):  changes in finger or toe nails  diarrhea  dry or itchy skin  hair loss  headache  loss of appetite  sensitivity of eyes to the light  stomach upset  unusually teary eyes This list may not describe all possible side effects. Call your doctor for medical advice about side effects. You may report side effects to FDA at 1-800-FDA-1088. Where should I keep my medicine? This drug is given in a hospital or clinic and will not be stored at home. NOTE: This sheet is a summary. It may not cover all possible information. If you have questions about this medicine, talk to your doctor, pharmacist, or health care provider.  2020 Elsevier/Gold Standard (2008-01-26 13:53:16)

## 2019-08-30 NOTE — Addendum Note (Signed)
Addended by: Tania Ade on: 08/30/2019 10:09 AM   Modules accepted: Orders

## 2019-08-30 NOTE — Progress Notes (Signed)
[facility] OFFICE PROGRESS NOTE   Diagnosis: Colon cancer  INTERVAL HISTORY:   Ms. Stangler completed first cycle of FOLFOX while in the hospital beginning 08/18/2019.  She was discharged from the hospital on 08/23/2019.  No nausea/vomiting or diarrhea following chemotherapy.  She had cold sensitivity for several days.  She has mild tingling in the left fingertips.  No other neuropathy symptoms.  She had soreness at the mouth following chemotherapy.  She relates this to eating ice immediately following chemotherapy.  She continues to have limited mobility.  She is quarantined to her room at the skilled nursing facility for a total of 14 days.  She will then be able to participate in full physical therapy.  Abdominal pain has improved.  Objective:  Vital signs in last 24 hours:  Blood pressure 129/77, pulse (!) 101, temperature 98 F (36.7 C), resp. rate 18, height 5' 7"  (1.702 m), weight (!) 315 lb 6.4 oz (143.1 kg), SpO2 98 %.    HEENT: Resolving ulcers at the posterior palate, no thrush, 2-3 mm resolving ulcer at the left side of the tongue Resp: Inspiratory rhonchi at the left posterior base Cardio: Regular rhythm, distant heart sounds GI: Fullness in the right abdomen without a discrete liver edge Vascular: No leg edema  Skin: Palms without erythema  Portacath/PICC-without erythema  Lab Results:  Lab Results  Component Value Date   WBC 6.5 08/30/2019   HGB 9.4 (L) 08/30/2019   HCT 31.8 (L) 08/30/2019   MCV 87.6 08/30/2019   PLT 278 08/30/2019   NEUTROABS 4.4 08/30/2019    CMP  Lab Results  Component Value Date   NA 140 08/30/2019   K 4.0 08/30/2019   CL 105 08/30/2019   CO2 24 08/30/2019   GLUCOSE 148 (H) 08/30/2019   BUN 13 08/30/2019   CREATININE 1.02 (H) 08/30/2019   CALCIUM 8.2 (L) 08/30/2019   PROT 5.9 (L) 08/30/2019   ALBUMIN 1.5 (L) 08/30/2019   AST 40 08/30/2019   ALT 18 08/30/2019   ALKPHOS 226 (H) 08/30/2019   BILITOT 0.8 08/30/2019    GFRNONAA 56 (L) 08/30/2019   GFRAA >60 08/30/2019    Lab Results  Component Value Date   CEA1 806.0 (H) 08/13/2019     Medications: I have reviewed the patient's current medications.   Assessment/Plan: 1. 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), right colectomy 04/22/2018  Ascending colon, moderately differentiated mucinous adenocarcinoma, no lymphovascular perineural invasion, no tumor deposits, tumor budding-low, 0/14 lymph nodes, tumor invades into the subserosa and is focally present at the inked serosal surface (pT4), no loss of mismatch repair protein expression  Colonoscopy 06/13/2017-ascending colon mass-adenocarcinoma, polyps in the ascending and sigmoid-tubular adenomas  CT abdomen/pelvis 08/13/2019-multiple peritoneal masses compatible with metastatic disease, multiple hypodense liver lesions consistent with metastatic disease  CEA 08/13/2019 -806.0  CT biopsy of a peritoneal mass 08/15/2019, metastatic adenocarcinoma consistent with colon cancer ? Cycle 1 FOLFOX 08/18/2019  ? Cycle 2 FOLFOX 08/30/2019-5-FU dose reduced secondary to mucositis  2.Acute pulmonary embolism and left popliteal DVT 02/05/2018 3.Iron deficiency anemia September 2018 4.Atrial fibrillation 5.Congestive heart failure 6.Hypertension 7.Morbid obesity 8.Renal failure  Disposition: Ms. Goforth appears stable.  She will complete cycle 2 FOLFOX today.  I will dose reduce the 5-fluorouracil secondary to mucositis following cycle 1.  She otherwise tolerated the chemotherapy well.  Her overall performance status appears improved compared to when I first saw her in the hospital.  She will return for an office visit and cycle 3  FOLFOX in 2 weeks.  She will contact us in the interim as needed.  We prescribed EMLA cream and Magic mouthwash today.  We will check the CEA when she returns in 2 weeks.    Betsy Coder, MD  08/30/2019  9:20 AM

## 2019-09-01 ENCOUNTER — Inpatient Hospital Stay: Payer: Medicare HMO

## 2019-09-01 ENCOUNTER — Other Ambulatory Visit: Payer: Self-pay

## 2019-09-01 VITALS — BP 102/79 | HR 87 | Temp 98.0°F | Resp 22

## 2019-09-01 DIAGNOSIS — C189 Malignant neoplasm of colon, unspecified: Secondary | ICD-10-CM

## 2019-09-01 DIAGNOSIS — Z5111 Encounter for antineoplastic chemotherapy: Secondary | ICD-10-CM | POA: Diagnosis not present

## 2019-09-01 MED ORDER — SODIUM CHLORIDE 0.9% FLUSH
10.0000 mL | INTRAVENOUS | Status: DC | PRN
  Administered 2019-09-01: 10:00:00 10 mL
  Filled 2019-09-01: qty 10

## 2019-09-01 MED ORDER — HEPARIN SOD (PORK) LOCK FLUSH 100 UNIT/ML IV SOLN
500.0000 [IU] | Freq: Once | INTRAVENOUS | Status: AC | PRN
  Administered 2019-09-01: 10:00:00 500 [IU]
  Filled 2019-09-01: qty 5

## 2019-09-05 ENCOUNTER — Encounter (HOSPITAL_COMMUNITY): Payer: Self-pay | Admitting: Oncology

## 2019-09-11 ENCOUNTER — Other Ambulatory Visit: Payer: Self-pay | Admitting: Oncology

## 2019-09-13 ENCOUNTER — Inpatient Hospital Stay: Payer: Medicare HMO | Admitting: Oncology

## 2019-09-13 ENCOUNTER — Inpatient Hospital Stay: Payer: Medicare HMO | Attending: Oncology

## 2019-09-13 ENCOUNTER — Other Ambulatory Visit: Payer: Self-pay

## 2019-09-13 ENCOUNTER — Inpatient Hospital Stay: Payer: Medicare HMO

## 2019-09-13 VITALS — BP 111/72 | HR 115 | Temp 98.0°F | Resp 18 | Ht 67.0 in

## 2019-09-13 DIAGNOSIS — M25571 Pain in right ankle and joints of right foot: Secondary | ICD-10-CM | POA: Diagnosis not present

## 2019-09-13 DIAGNOSIS — C787 Secondary malignant neoplasm of liver and intrahepatic bile duct: Secondary | ICD-10-CM

## 2019-09-13 DIAGNOSIS — I82432 Acute embolism and thrombosis of left popliteal vein: Secondary | ICD-10-CM | POA: Insufficient documentation

## 2019-09-13 DIAGNOSIS — C189 Malignant neoplasm of colon, unspecified: Secondary | ICD-10-CM

## 2019-09-13 DIAGNOSIS — I110 Hypertensive heart disease with heart failure: Secondary | ICD-10-CM | POA: Insufficient documentation

## 2019-09-13 DIAGNOSIS — M25572 Pain in left ankle and joints of left foot: Secondary | ICD-10-CM | POA: Insufficient documentation

## 2019-09-13 DIAGNOSIS — I509 Heart failure, unspecified: Secondary | ICD-10-CM | POA: Insufficient documentation

## 2019-09-13 DIAGNOSIS — I4891 Unspecified atrial fibrillation: Secondary | ICD-10-CM | POA: Insufficient documentation

## 2019-09-13 DIAGNOSIS — C182 Malignant neoplasm of ascending colon: Secondary | ICD-10-CM | POA: Diagnosis not present

## 2019-09-13 DIAGNOSIS — Z95828 Presence of other vascular implants and grafts: Secondary | ICD-10-CM

## 2019-09-13 DIAGNOSIS — Z5111 Encounter for antineoplastic chemotherapy: Secondary | ICD-10-CM | POA: Insufficient documentation

## 2019-09-13 DIAGNOSIS — I2699 Other pulmonary embolism without acute cor pulmonale: Secondary | ICD-10-CM | POA: Insufficient documentation

## 2019-09-13 LAB — CBC WITH DIFFERENTIAL (CANCER CENTER ONLY)
Abs Immature Granulocytes: 0.15 10*3/uL — ABNORMAL HIGH (ref 0.00–0.07)
Basophils Absolute: 0 10*3/uL (ref 0.0–0.1)
Basophils Relative: 1 %
Eosinophils Absolute: 0 10*3/uL (ref 0.0–0.5)
Eosinophils Relative: 0 %
HCT: 30.5 % — ABNORMAL LOW (ref 36.0–46.0)
Hemoglobin: 8.8 g/dL — ABNORMAL LOW (ref 12.0–15.0)
Immature Granulocytes: 2 %
Lymphocytes Relative: 14 %
Lymphs Abs: 1 10*3/uL (ref 0.7–4.0)
MCH: 25.8 pg — ABNORMAL LOW (ref 26.0–34.0)
MCHC: 28.9 g/dL — ABNORMAL LOW (ref 30.0–36.0)
MCV: 89.4 fL (ref 80.0–100.0)
Monocytes Absolute: 1.5 10*3/uL — ABNORMAL HIGH (ref 0.1–1.0)
Monocytes Relative: 21 %
Neutro Abs: 4.4 10*3/uL (ref 1.7–7.7)
Neutrophils Relative %: 62 %
Platelet Count: 463 10*3/uL — ABNORMAL HIGH (ref 150–400)
RBC: 3.41 MIL/uL — ABNORMAL LOW (ref 3.87–5.11)
RDW: 18.9 % — ABNORMAL HIGH (ref 11.5–15.5)
WBC Count: 7 10*3/uL (ref 4.0–10.5)
nRBC: 0 % (ref 0.0–0.2)

## 2019-09-13 LAB — CMP (CANCER CENTER ONLY)
ALT: 14 U/L (ref 0–44)
AST: 21 U/L (ref 15–41)
Albumin: 1.7 g/dL — ABNORMAL LOW (ref 3.5–5.0)
Alkaline Phosphatase: 227 U/L — ABNORMAL HIGH (ref 38–126)
Anion gap: 10 (ref 5–15)
BUN: 12 mg/dL (ref 8–23)
CO2: 28 mmol/L (ref 22–32)
Calcium: 8.4 mg/dL — ABNORMAL LOW (ref 8.9–10.3)
Chloride: 103 mmol/L (ref 98–111)
Creatinine: 1.02 mg/dL — ABNORMAL HIGH (ref 0.44–1.00)
GFR, Est AFR Am: >60 mL/min (ref >60)
GFR, Estimated: 56 mL/min — ABNORMAL LOW (ref >60)
Glucose, Bld: 139 mg/dL — ABNORMAL HIGH (ref 70–99)
Potassium: 4.1 mmol/L (ref 3.5–5.1)
Sodium: 141 mmol/L (ref 135–145)
Total Bilirubin: 0.6 mg/dL (ref 0.3–1.2)
Total Protein: 6.5 g/dL (ref 6.5–8.1)

## 2019-09-13 LAB — CEA (IN HOUSE-CHCC): CEA (CHCC-In House): 1047.72 ng/mL — ABNORMAL HIGH (ref 0.00–5.00)

## 2019-09-13 MED ORDER — DEXAMETHASONE SODIUM PHOSPHATE 10 MG/ML IJ SOLN
INTRAMUSCULAR | Status: AC
  Filled 2019-09-13: qty 1

## 2019-09-13 MED ORDER — DEXTROSE 5 % IV SOLN
Freq: Once | INTRAVENOUS | Status: AC
  Administered 2019-09-13: 12:00:00 via INTRAVENOUS
  Filled 2019-09-13: qty 250

## 2019-09-13 MED ORDER — PALONOSETRON HCL INJECTION 0.25 MG/5ML
0.2500 mg | Freq: Once | INTRAVENOUS | Status: AC
  Administered 2019-09-13: 0.25 mg via INTRAVENOUS

## 2019-09-13 MED ORDER — HEPARIN SOD (PORK) LOCK FLUSH 100 UNIT/ML IV SOLN
500.0000 [IU] | Freq: Once | INTRAVENOUS | Status: DC | PRN
  Filled 2019-09-13: qty 5

## 2019-09-13 MED ORDER — SODIUM CHLORIDE 0.9% FLUSH
10.0000 mL | INTRAVENOUS | Status: DC | PRN
  Administered 2019-09-13: 10 mL via INTRAVENOUS
  Filled 2019-09-13: qty 10

## 2019-09-13 MED ORDER — OXALIPLATIN CHEMO INJECTION 100 MG/20ML
65.0000 mg/m2 | Freq: Once | INTRAVENOUS | Status: AC
  Administered 2019-09-13: 170 mg via INTRAVENOUS
  Filled 2019-09-13: qty 34

## 2019-09-13 MED ORDER — DEXAMETHASONE SODIUM PHOSPHATE 10 MG/ML IJ SOLN
10.0000 mg | Freq: Once | INTRAMUSCULAR | Status: AC
  Administered 2019-09-13: 10 mg via INTRAVENOUS

## 2019-09-13 MED ORDER — LEUCOVORIN CALCIUM INJECTION 350 MG
300.0000 mg/m2 | Freq: Once | INTRAVENOUS | Status: AC
  Administered 2019-09-13: 13:00:00 778 mg via INTRAVENOUS
  Filled 2019-09-13: qty 38.9

## 2019-09-13 MED ORDER — PALONOSETRON HCL INJECTION 0.25 MG/5ML
INTRAVENOUS | Status: AC
  Filled 2019-09-13: qty 5

## 2019-09-13 MED ORDER — SODIUM CHLORIDE 0.9% FLUSH
10.0000 mL | INTRAVENOUS | Status: DC | PRN
  Filled 2019-09-13: qty 10

## 2019-09-13 MED ORDER — ACETAMINOPHEN 325 MG PO TABS
650.0000 mg | ORAL_TABLET | Freq: Once | ORAL | Status: AC
  Administered 2019-09-13: 650 mg via ORAL

## 2019-09-13 MED ORDER — ACETAMINOPHEN 325 MG PO TABS
ORAL_TABLET | ORAL | Status: AC
  Filled 2019-09-13: qty 2

## 2019-09-13 MED ORDER — SODIUM CHLORIDE 0.9 % IV SOLN
1600.0000 mg/m2 | INTRAVENOUS | Status: DC
  Administered 2019-09-13: 4150 mg via INTRAVENOUS
  Filled 2019-09-13: qty 83

## 2019-09-13 NOTE — Patient Instructions (Signed)
[facility] Discharge Instructions for Patients Receiving Chemotherapy  The following scripts have been noted on your discharge paperwork for SNF:  EMLA cream-apply to port 1 hour prior to stick and cover with plastic wrap Zofran 8 mg every 8 hours as needed for nausea (start 72 hours after chemo infusion)   Today you received the following chemotherapy agents oxaliplatin, leucovorin, fluoroucil  To help prevent nausea and vomiting after your treatment, we encourage you to take your nausea medication as prescribed.   If you develop nausea and vomiting that is not controlled by your nausea medication, call the clinic.   BELOW ARE SYMPTOMS THAT SHOULD BE REPORTED IMMEDIATELY:  *FEVER GREATER THAN 100.5 F  *CHILLS WITH OR WITHOUT FEVER  NAUSEA AND VOMITING THAT IS NOT CONTROLLED WITH YOUR NAUSEA MEDICATION  *UNUSUAL SHORTNESS OF BREATH  *UNUSUAL BRUISING OR BLEEDING  TENDERNESS IN MOUTH AND THROAT WITH OR WITHOUT PRESENCE OF ULCERS  *URINARY PROBLEMS  *BOWEL PROBLEMS  UNUSUAL RASH Items with * indicate a potential emergency and should be followed up as soon as possible.  Feel free to call the clinic should you have any questions or concerns. The clinic phone number is [phone].  Please show the [facility] at check-in to the Emergency Department and triage nurse.  Oxaliplatin Injection What is this medicine? OXALIPLATIN (ox AL i PLA tin) is a chemotherapy drug. It targets fast dividing cells, like cancer cells, and causes these cells to die. This medicine is used to treat cancers of the colon and rectum, and many other cancers. This medicine may be used for other purposes; ask your health care provider or pharmacist if you have questions. COMMON BRAND NAME(S): Eloxatin What should I tell my health care provider before I take this medicine? They need to know if you have any of these conditions:  kidney disease  an unusual or allergic reaction to  oxaliplatin, other chemotherapy, other medicines, foods, dyes, or preservatives  pregnant or trying to get pregnant  breast-feeding How should I use this medicine? This drug is given as an infusion into a vein. It is administered in a hospital or clinic by a specially trained health care professional. Talk to your pediatrician regarding the use of this medicine in children. Special care may be needed. Overdosage: If you think you have taken too much of this medicine contact a poison control center or emergency room at once. NOTE: This medicine is only for you. Do not share this medicine with others. What if I miss a dose? It is important not to miss a dose. Call your doctor or health care professional if you are unable to keep an appointment. What may interact with this medicine?  medicines to increase blood counts like filgrastim, pegfilgrastim, sargramostim  probenecid  some antibiotics like amikacin, gentamicin, neomycin, polymyxin B, streptomycin, tobramycin  zalcitabine Talk to your doctor or health care professional before taking any of these medicines:  acetaminophen  aspirin  ibuprofen  ketoprofen  naproxen This list may not describe all possible interactions. Give your health care provider a list of all the medicines, herbs, non-prescription drugs, or dietary supplements you use. Also tell them if you smoke, drink alcohol, or use illegal drugs. Some items may interact with your medicine. What should I watch for while using this medicine? Your condition will be monitored carefully while you are receiving this medicine. You will need important blood work done while you are taking this medicine. This medicine can make you more  sensitive to cold. Do not drink cold drinks or use ice. Cover exposed skin before coming in contact with cold temperatures or cold objects. When out in cold weather wear warm clothing and cover your mouth and nose to warm the air that goes into your  lungs. Tell your doctor if you get sensitive to the cold. This drug may make you feel generally unwell. This is not uncommon, as chemotherapy can affect healthy cells as well as cancer cells. Report any side effects. Continue your course of treatment even though you feel ill unless your doctor tells you to stop. In some cases, you may be given additional medicines to help with side effects. Follow all directions for their use. Call your doctor or health care professional for advice if you get a fever, chills or sore throat, or other symptoms of a cold or flu. Do not treat yourself. This drug decreases your body's ability to fight infections. Try to avoid being around people who are sick. This medicine may increase your risk to bruise or bleed. Call your doctor or health care professional if you notice any unusual bleeding. Be careful brushing and flossing your teeth or using a toothpick because you may get an infection or bleed more easily. If you have any dental work done, tell your dentist you are receiving this medicine. Avoid taking products that contain aspirin, acetaminophen, ibuprofen, naproxen, or ketoprofen unless instructed by your doctor. These medicines may hide a fever. Do not become pregnant while taking this medicine. Women should inform their doctor if they wish to become pregnant or think they might be pregnant. There is a potential for serious side effects to an unborn child. Talk to your health care professional or pharmacist for more information. Do not breast-feed an infant while taking this medicine. Call your doctor or health care professional if you get diarrhea. Do not treat yourself. What side effects may I notice from receiving this medicine? Side effects that you should report to your doctor or health care professional as soon as possible:  allergic reactions like skin rash, itching or hives, swelling of the face, lips, or tongue  low blood counts - This drug may decrease  the number of white blood cells, red blood cells and platelets. You may be at increased risk for infections and bleeding.  signs of infection - fever or chills, cough, sore throat, pain or difficulty passing urine  signs of decreased platelets or bleeding - bruising, pinpoint red spots on the skin, black, tarry stools, nosebleeds  signs of decreased red blood cells - unusually weak or tired, fainting spells, lightheadedness  breathing problems  chest pain, pressure  cough  diarrhea  jaw tightness  mouth sores  nausea and vomiting  pain, swelling, redness or irritation at the injection site  pain, tingling, numbness in the hands or feet  problems with balance, talking, walking  redness, blistering, peeling or loosening of the skin, including inside the mouth  trouble passing urine or change in the amount of urine Side effects that usually do not require medical attention (report to your doctor or health care professional if they continue or are bothersome):  changes in vision  constipation  hair loss  loss of appetite  metallic taste in the mouth or changes in taste  stomach pain This list may not describe all possible side effects. Call your doctor for medical advice about side effects. You may report side effects to FDA at 1-800-FDA-1088. Where should I keep my medicine? This  drug is given in a hospital or clinic and will not be stored at home. NOTE: This sheet is a summary. It may not cover all possible information. If you have questions about this medicine, talk to your doctor, pharmacist, or health care provider.  2020 Elsevier/Gold Standard (2008-04-18 17:22:47)  Leucovorin injection What is this medicine? LEUCOVORIN (loo koe VOR in) is used to prevent or treat the harmful effects of some medicines. This medicine is used to treat anemia caused by a low amount of folic acid in the body. It is also used with 5-fluorouracil (5-FU) to treat colon cancer. This  medicine may be used for other purposes; ask your health care provider or pharmacist if you have questions. What should I tell my health care provider before I take this medicine? They need to know if you have any of these conditions:  anemia from low levels of vitamin B-12 in the blood  an unusual or allergic reaction to leucovorin, folic acid, other medicines, foods, dyes, or preservatives  pregnant or trying to get pregnant  breast-feeding How should I use this medicine? This medicine is for injection into a muscle or into a vein. It is given by a health care professional in a hospital or clinic setting. Talk to your pediatrician regarding the use of this medicine in children. Special care may be needed. Overdosage: If you think you have taken too much of this medicine contact a poison control center or emergency room at once. NOTE: This medicine is only for you. Do not share this medicine with others. What if I miss a dose? This does not apply. What may interact with this medicine?  capecitabine  fluorouracil  phenobarbital  phenytoin  primidone  trimethoprim-sulfamethoxazole This list may not describe all possible interactions. Give your health care provider a list of all the medicines, herbs, non-prescription drugs, or dietary supplements you use. Also tell them if you smoke, drink alcohol, or use illegal drugs. Some items may interact with your medicine. What should I watch for while using this medicine? Your condition will be monitored carefully while you are receiving this medicine. This medicine may increase the side effects of 5-fluorouracil, 5-FU. Tell your doctor or health care professional if you have diarrhea or mouth sores that do not get better or that get worse. What side effects may I notice from receiving this medicine? Side effects that you should report to your doctor or health care professional as soon as possible:  allergic reactions like skin rash,  itching or hives, swelling of the face, lips, or tongue  breathing problems  fever, infection  mouth sores  unusual bleeding or bruising  unusually weak or tired Side effects that usually do not require medical attention (report to your doctor or health care professional if they continue or are bothersome):  constipation or diarrhea  loss of appetite  nausea, vomiting This list may not describe all possible side effects. Call your doctor for medical advice about side effects. You may report side effects to FDA at 1-800-FDA-1088. Where should I keep my medicine? This drug is given in a hospital or clinic and will not be stored at home. NOTE: This sheet is a summary. It may not cover all possible information. If you have questions about this medicine, talk to your doctor, pharmacist, or health care provider.  2020 Elsevier/Gold Standard (2008-03-28 16:50:29)  Fluorouracil, 5-FU injection What is this medicine? FLUOROURACIL, 5-FU (flure oh YOOR a sil) is a chemotherapy drug. It slows the growth  of cancer cells. This medicine is used to treat many types of cancer like breast cancer, colon or rectal cancer, pancreatic cancer, and stomach cancer. This medicine may be used for other purposes; ask your health care provider or pharmacist if you have questions. COMMON BRAND NAME(S): Adrucil What should I tell my health care provider before I take this medicine? They need to know if you have any of these conditions:  blood disorders  dihydropyrimidine dehydrogenase (DPD) deficiency  infection (especially a virus infection such as chickenpox, cold sores, or herpes)  kidney disease  liver disease  malnourished, poor nutrition  recent or ongoing radiation therapy  an unusual or allergic reaction to fluorouracil, other chemotherapy, other medicines, foods, dyes, or preservatives  pregnant or trying to get pregnant  breast-feeding How should I use this medicine? This drug is  given as an infusion or injection into a vein. It is administered in a hospital or clinic by a specially trained health care professional. Talk to your pediatrician regarding the use of this medicine in children. Special care may be needed. Overdosage: If you think you have taken too much of this medicine contact a poison control center or emergency room at once. NOTE: This medicine is only for you. Do not share this medicine with others. What if I miss a dose? It is important not to miss your dose. Call your doctor or health care professional if you are unable to keep an appointment. What may interact with this medicine?  allopurinol  cimetidine  dapsone  digoxin  hydroxyurea  leucovorin  levamisole  medicines for seizures like ethotoin, fosphenytoin, phenytoin  medicines to increase blood counts like filgrastim, pegfilgrastim, sargramostim  medicines that treat or prevent blood clots like warfarin, enoxaparin, and dalteparin  methotrexate  metronidazole  pyrimethamine  some other chemotherapy drugs like busulfan, cisplatin, estramustine, vinblastine  trimethoprim  trimetrexate  vaccines Talk to your doctor or health care professional before taking any of these medicines:  acetaminophen  aspirin  ibuprofen  ketoprofen  naproxen This list may not describe all possible interactions. Give your health care provider a list of all the medicines, herbs, non-prescription drugs, or dietary supplements you use. Also tell them if you smoke, drink alcohol, or use illegal drugs. Some items may interact with your medicine. What should I watch for while using this medicine? Visit your doctor for checks on your progress. This drug may make you feel generally unwell. This is not uncommon, as chemotherapy can affect healthy cells as well as cancer cells. Report any side effects. Continue your course of treatment even though you feel ill unless your doctor tells you to stop. In  some cases, you may be given additional medicines to help with side effects. Follow all directions for their use. Call your doctor or health care professional for advice if you get a fever, chills or sore throat, or other symptoms of a cold or flu. Do not treat yourself. This drug decreases your body's ability to fight infections. Try to avoid being around people who are sick. This medicine may increase your risk to bruise or bleed. Call your doctor or health care professional if you notice any unusual bleeding. Be careful brushing and flossing your teeth or using a toothpick because you may get an infection or bleed more easily. If you have any dental work done, tell your dentist you are receiving this medicine. Avoid taking products that contain aspirin, acetaminophen, ibuprofen, naproxen, or ketoprofen unless instructed by your doctor. These medicines  may hide a fever. Do not become pregnant while taking this medicine. Women should inform their doctor if they wish to become pregnant or think they might be pregnant. There is a potential for serious side effects to an unborn child. Talk to your health care professional or pharmacist for more information. Do not breast-feed an infant while taking this medicine. Men should inform their doctor if they wish to father a child. This medicine may lower sperm counts. Do not treat diarrhea with over the counter products. Contact your doctor if you have diarrhea that lasts more than 2 days or if it is severe and watery. This medicine can make you more sensitive to the sun. Keep out of the sun. If you cannot avoid being in the sun, wear protective clothing and use sunscreen. Do not use sun lamps or tanning beds/booths. What side effects may I notice from receiving this medicine? Side effects that you should report to your doctor or health care professional as soon as possible:  allergic reactions like skin rash, itching or hives, swelling of the face, lips, or  tongue  low blood counts - this medicine may decrease the number of white blood cells, red blood cells and platelets. You may be at increased risk for infections and bleeding.  signs of infection - fever or chills, cough, sore throat, pain or difficulty passing urine  signs of decreased platelets or bleeding - bruising, pinpoint red spots on the skin, black, tarry stools, blood in the urine  signs of decreased red blood cells - unusually weak or tired, fainting spells, lightheadedness  breathing problems  changes in vision  chest pain  mouth sores  nausea and vomiting  pain, swelling, redness at site where injected  pain, tingling, numbness in the hands or feet  redness, swelling, or sores on hands or feet  stomach pain  unusual bleeding Side effects that usually do not require medical attention (report to your doctor or health care professional if they continue or are bothersome):  changes in finger or toe nails  diarrhea  dry or itchy skin  hair loss  headache  loss of appetite  sensitivity of eyes to the light  stomach upset  unusually teary eyes This list may not describe all possible side effects. Call your doctor for medical advice about side effects. You may report side effects to FDA at 1-800-FDA-1088. Where should I keep my medicine? This drug is given in a hospital or clinic and will not be stored at home. NOTE: This sheet is a summary. It may not cover all possible information. If you have questions about this medicine, talk to your doctor, pharmacist, or health care provider.  2020 Elsevier/Gold Standard (2008-01-26 13:53:16)

## 2019-09-13 NOTE — Progress Notes (Addendum)
  [facility] OFFICE PROGRESS NOTE   Diagnosis: Colon cancer  INTERVAL HISTORY:   [name] completed another cycle of FOLFOX on 08/30/2019.  She reports less cold sensitivity following this cycle of chemotherapy.  No diarrhea or nausea.  No peripheral numbness.  She reports bilateral ankle pain.  This has occurred in the past.  She remains in the skilled nursing facility and is participating in physical therapy.  Abdominal pain has improved.  Objective:  Vital signs in last 24 hours:  Blood pressure 111/72, pulse (!) 115, temperature 98 F (36.7 C), temperature source Temporal, resp. rate 18, height '5\' 7"'$  (1.702 m), SpO2 98 %.    HEENT: No thrush.  Resolving ulcer at the posterior left palate Resp: Lungs clear bilaterally Cardio: Regular rate and rhythm GI: No mass, I cannot palpate the liver edge Vascular: No leg edema Musculoskeletal: Pain with motion at the ankle bilaterally, no swelling or erythema Skin: Palms without erythema  Portacath/PICC-without erythema  Lab Results:  Lab Results  Component Value Date   WBC 7.0 09/13/2019   HGB 8.8 (L) 09/13/2019   HCT 30.5 (L) 09/13/2019   MCV 89.4 09/13/2019   PLT 463 (H) 09/13/2019   NEUTROABS 4.4 09/13/2019    CMP  Lab Results  Component Value Date   NA 141 09/13/2019   K 4.1 09/13/2019   CL 103 09/13/2019   CO2 28 09/13/2019   GLUCOSE 139 (H) 09/13/2019   BUN 12 09/13/2019   CREATININE 1.02 (H) 09/13/2019   CALCIUM 8.4 (L) 09/13/2019   PROT 6.5 09/13/2019   ALBUMIN 1.7 (L) 09/13/2019   AST 21 09/13/2019   ALT 14 09/13/2019   ALKPHOS 227 (H) 09/13/2019   BILITOT 0.6 09/13/2019   GFRNONAA 56 (L) 09/13/2019   GFRAA >60 09/13/2019    Lab Results  Component Value Date   CEA1 806.0 (H) 08/13/2019     Medications: I have reviewed the patient's current medications.   Assessment/Plan:  1. 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), right colectomy 04/22/2018  Ascending colon, moderately differentiated  mucinous adenocarcinoma, no lymphovascular perineural invasion, no tumor deposits, tumor budding-low, 0/14 lymph nodes, tumor invades into the subserosa and is focally present at the inked serosal surface (pT4), no loss of mismatch repair protein expression  Colonoscopy 06/13/2017-ascending colon mass-adenocarcinoma, polyps in the ascending and sigmoid-tubular adenomas  CT abdomen/pelvis 08/13/2019-multiple peritoneal masses compatible with metastatic disease, multiple hypodense liver lesions consistent with metastatic disease  CEA 08/13/2019 -806.0  CT biopsy of a peritoneal mass 08/15/2019, metastatic adenocarcinoma consistent with colon cancer  MSS, tumor mutation burden 5, KRAS G12V ? Cycle 1 FOLFOX 08/18/2019  ? Cycle 2 FOLFOX 08/30/2019-5-FU dose reduced secondary to mucositis ? Cycle 3 FOLFOX 09/13/2019  2.Acute pulmonary embolism and left popliteal DVT 02/05/2018 3.Iron deficiency anemia September 2018 4.Atrial fibrillation 5.Congestive heart failure 6.Hypertension 7.Morbid obesity 8.Renal failure    Disposition: Amber Medina has completed 2 cycles of FOLFOX.  She has tolerated the chemotherapy well.  Her overall performance status appears improved.  We will follow up on the CEA from today.  She will complete cycle 3 FOLFOX today.  The ankle discomfort is likely related to a benign musculoskeletal condition.  I encouraged her to continue physical therapy.  [name] will return for an office visit in the next cycle of chemotherapy in 2 weeks.    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  Betsy Coder, MD  09/13/2019  10:03 AM

## 2019-09-13 NOTE — Patient Instructions (Signed)

## 2019-09-15 ENCOUNTER — Inpatient Hospital Stay: Payer: Medicare HMO

## 2019-09-15 ENCOUNTER — Other Ambulatory Visit: Payer: Self-pay

## 2019-09-15 VITALS — BP 134/81 | HR 88 | Temp 97.5°F | Resp 20

## 2019-09-15 DIAGNOSIS — C189 Malignant neoplasm of colon, unspecified: Secondary | ICD-10-CM

## 2019-09-15 DIAGNOSIS — Z5111 Encounter for antineoplastic chemotherapy: Secondary | ICD-10-CM | POA: Diagnosis not present

## 2019-09-15 MED ORDER — HEPARIN SOD (PORK) LOCK FLUSH 100 UNIT/ML IV SOLN
500.0000 [IU] | Freq: Once | INTRAVENOUS | Status: AC | PRN
  Administered 2019-09-15: 500 [IU]
  Filled 2019-09-15: qty 5

## 2019-09-15 MED ORDER — SODIUM CHLORIDE 0.9% FLUSH
10.0000 mL | INTRAVENOUS | Status: DC | PRN
  Administered 2019-09-15: 13:00:00 10 mL
  Filled 2019-09-15: qty 10

## 2019-09-15 NOTE — Patient Instructions (Signed)

## 2019-09-16 ENCOUNTER — Telehealth: Payer: Self-pay | Admitting: *Deleted

## 2019-09-16 NOTE — Telephone Encounter (Signed)
Called thinking she had a "reaction" to her last chemo treatment. Day afterwards, felt extreme constipation with significant abdominal pain. Took a few doses of Senna-S and had large BM followed by diarrhea for a day. Informed her that it is not uncommon to have some constipation w/FOLFOX. Suggested for next treatment to take her stool softener day prior and day of chemo, then stop to see if that will prevent the acute phase of constipation. She agrees to try this. Informed her that her schedule and last office note was faxed to SNF.

## 2019-09-19 ENCOUNTER — Telehealth: Payer: Self-pay | Admitting: *Deleted

## 2019-09-19 MED ORDER — DOCUSATE SODIUM 100 MG PO CAPS: 100.0000 mg | ORAL_CAPSULE | Freq: Two times a day (BID) | ORAL | 1 refills | Status: DC

## 2019-09-19 NOTE — Telephone Encounter (Addendum)
Trouble w/constipation again and thinks she needs colace, but SNF says she does not have an order for this. All the facility has orders for his MOM. Faxed orders for Colace 100-200 mg bid for constipation w/confirmation received. Called facility to speak w/nurse to confirm it will start today and was put to voice mail. Left detailed message on voice mail w/request to call nurse back to confirm. Patient was notified of above. She will speak with nurse about orders today.

## 2019-09-20 ENCOUNTER — Ambulatory Visit: Payer: Medicare Other | Admitting: Cardiology

## 2019-09-21 ENCOUNTER — Telehealth: Payer: Self-pay | Admitting: *Deleted

## 2019-09-21 ENCOUNTER — Encounter: Payer: Self-pay | Admitting: General Practice

## 2019-09-21 NOTE — Telephone Encounter (Addendum)
She reports SNF is planning on her d/c to home on Saturday and she will need home health arrangements. She is asking if [name] needs to write an order for this and who makes the arrangements?  Informed patient per our CSW, it is the responsibility of the SNF to arrange for all this initially and [name] can then take over home health orders for renewal as needed. Informed her of out transportation service if needed, but will need notice to arrange it and she will need to ambulatory and get in and out of car without assistance.

## 2019-09-21 NOTE — Progress Notes (Signed)
[facility] CSW [place] Notes  Spoke w [place], [place] at [facility] [phone]).  SNF has said that patient's insurance cut her off but that she can appeal this decision.  Patient has appealed this decision - this may result in several more days of SNF care if appealed.  DME will be ordered by SNF CSW, HH orders will be placed by SNF for PT/OT/RN/Aide when discharged.  SNF is awaiting decision on appeal.  Per SNF CSW, patient is prepared to DC on Saturday but will stay for several more days if appeal is granted.  Patient will definitely need transportation to/from [facility] appointments if possible, most likely will need wheelchair transport per SNF rehab staff.  She has not used rolling walker or similar in SNF, has only stood and walked several steps during PT treatment.  CSW will alert [facility] transport; however, rides cannot be scheduled until discharge planning from SNF is complete.  Edwyna Shell, LCSW Clinical Social Worker Phone:  [phone] .

## 2019-09-23 ENCOUNTER — Encounter: Payer: Self-pay | Admitting: General Practice

## 2019-09-23 NOTE — Progress Notes (Signed)
[facility] CSW Progress Notes  Higher education careers adviser has arranged wheelchair transport w [facility] for appt on 12/22.  Patient must be "on the ground" in order to [place] to transport to [facility].  Transport has attempted to speak w daughter, no answer, they left a VM.  Attempting to clarify requirements for wheelchair transport.  Edwyna Shell, LCSW Clinical Social Worker Phone:  [phone]

## 2019-09-23 NOTE — Progress Notes (Signed)
[place] CSW PRogress Notes  Spoke w [place] CSW at [employer].  Pt will discharge tomorrow, daughter is at the home and trying to help w arrangements.  SNF CSW arranging for bariatric wheelchair (has been difficult to find), transfer bench for tub, bedside commode.  SNF CSW attempting to arrange [facility] PT/OT/RN - no [facility] has accepted patient at this time.  Daughter is trying to arrange private duty aides for patient, this will be private pay.  Daughter will return to [facility] on Sunday 12/20.    Edwyna Shell, LCSW Clinical Social Worker Phone:  [phone] Cell:  [phone]

## 2019-09-25 ENCOUNTER — Other Ambulatory Visit: Payer: Self-pay | Admitting: Oncology

## 2019-09-27 ENCOUNTER — Inpatient Hospital Stay: Payer: Medicare HMO

## 2019-09-27 ENCOUNTER — Encounter: Payer: Self-pay | Admitting: *Deleted

## 2019-09-27 ENCOUNTER — Encounter: Payer: Self-pay | Admitting: General Practice

## 2019-09-27 ENCOUNTER — Other Ambulatory Visit: Payer: Self-pay

## 2019-09-27 ENCOUNTER — Inpatient Hospital Stay (HOSPITAL_BASED_OUTPATIENT_CLINIC_OR_DEPARTMENT_OTHER): Payer: Medicare HMO | Admitting: Oncology

## 2019-09-27 VITALS — BP 143/79 | HR 90 | Temp 98.2°F | Resp 18 | Ht 67.0 in | Wt 290.2 lb

## 2019-09-27 DIAGNOSIS — C787 Secondary malignant neoplasm of liver and intrahepatic bile duct: Secondary | ICD-10-CM

## 2019-09-27 DIAGNOSIS — C189 Malignant neoplasm of colon, unspecified: Secondary | ICD-10-CM

## 2019-09-27 DIAGNOSIS — Z5111 Encounter for antineoplastic chemotherapy: Secondary | ICD-10-CM | POA: Diagnosis not present

## 2019-09-27 DIAGNOSIS — Z95828 Presence of other vascular implants and grafts: Secondary | ICD-10-CM

## 2019-09-27 LAB — CBC WITH DIFFERENTIAL (CANCER CENTER ONLY)
Abs Immature Granulocytes: 0.05 10*3/uL (ref 0.00–0.07)
Basophils Absolute: 0 10*3/uL (ref 0.0–0.1)
Basophils Relative: 0 %
Eosinophils Absolute: 0 10*3/uL (ref 0.0–0.5)
Eosinophils Relative: 0 %
HCT: 30.5 % — ABNORMAL LOW (ref 36.0–46.0)
Hemoglobin: 8.6 g/dL — ABNORMAL LOW (ref 12.0–15.0)
Immature Granulocytes: 1 %
Lymphocytes Relative: 8 %
Lymphs Abs: 0.6 10*3/uL — ABNORMAL LOW (ref 0.7–4.0)
MCH: 26.1 pg (ref 26.0–34.0)
MCHC: 28.2 g/dL — ABNORMAL LOW (ref 30.0–36.0)
MCV: 92.7 fL (ref 80.0–100.0)
Monocytes Absolute: 1 10*3/uL (ref 0.1–1.0)
Monocytes Relative: 15 %
Neutro Abs: 5.2 10*3/uL (ref 1.7–7.7)
Neutrophils Relative %: 76 %
Platelet Count: 246 10*3/uL (ref 150–400)
RBC: 3.29 MIL/uL — ABNORMAL LOW (ref 3.87–5.11)
RDW: 21.9 % — ABNORMAL HIGH (ref 11.5–15.5)
WBC Count: 6.9 10*3/uL (ref 4.0–10.5)
nRBC: 0 % (ref 0.0–0.2)

## 2019-09-27 LAB — CMP (CANCER CENTER ONLY)
ALT: 18 U/L (ref 0–44)
AST: 26 U/L (ref 15–41)
Albumin: 1.9 g/dL — ABNORMAL LOW (ref 3.5–5.0)
Alkaline Phosphatase: 331 U/L — ABNORMAL HIGH (ref 38–126)
Anion gap: 12 (ref 5–15)
BUN: 15 mg/dL (ref 8–23)
CO2: 22 mmol/L (ref 22–32)
Calcium: 8.4 mg/dL — ABNORMAL LOW (ref 8.9–10.3)
Chloride: 107 mmol/L (ref 98–111)
Creatinine: 0.96 mg/dL (ref 0.44–1.00)
GFR, Est AFR Am: >60 mL/min (ref >60)
GFR, Estimated: 60 mL/min — ABNORMAL LOW (ref >60)
Glucose, Bld: 183 mg/dL — ABNORMAL HIGH (ref 70–99)
Potassium: 3.6 mmol/L (ref 3.5–5.1)
Sodium: 141 mmol/L (ref 135–145)
Total Bilirubin: 0.6 mg/dL (ref 0.3–1.2)
Total Protein: 6.9 g/dL (ref 6.5–8.1)

## 2019-09-27 LAB — CEA (IN HOUSE-CHCC): CEA (CHCC-In House): 936.76 ng/mL — ABNORMAL HIGH (ref 0.00–5.00)

## 2019-09-27 MED ORDER — LEUCOVORIN CALCIUM INJECTION 350 MG
300.0000 mg/m2 | Freq: Once | INTRAVENOUS | Status: AC
  Administered 2019-09-27: 778 mg via INTRAVENOUS
  Filled 2019-09-27: qty 38.9

## 2019-09-27 MED ORDER — HEPARIN SOD (PORK) LOCK FLUSH 100 UNIT/ML IV SOLN
500.0000 [IU] | Freq: Once | INTRAVENOUS | Status: DC | PRN
  Filled 2019-09-27: qty 5

## 2019-09-27 MED ORDER — DEXAMETHASONE SODIUM PHOSPHATE 10 MG/ML IJ SOLN
INTRAMUSCULAR | Status: AC
  Filled 2019-09-27: qty 1

## 2019-09-27 MED ORDER — ACETAMINOPHEN 325 MG PO TABS
ORAL_TABLET | ORAL | Status: AC
  Filled 2019-09-27: qty 2

## 2019-09-27 MED ORDER — SODIUM CHLORIDE 0.9 % IV SOLN
1600.0000 mg/m2 | INTRAVENOUS | Status: DC
  Administered 2019-09-27: 4150 mg via INTRAVENOUS
  Filled 2019-09-27: qty 83

## 2019-09-27 MED ORDER — DEXTROSE 5 % IV SOLN
Freq: Once | INTRAVENOUS | Status: AC
  Filled 2019-09-27: qty 250

## 2019-09-27 MED ORDER — PALONOSETRON HCL INJECTION 0.25 MG/5ML
0.2500 mg | Freq: Once | INTRAVENOUS | Status: AC
  Administered 2019-09-27: 0.25 mg via INTRAVENOUS

## 2019-09-27 MED ORDER — SODIUM CHLORIDE 0.9% FLUSH
10.0000 mL | INTRAVENOUS | Status: DC | PRN
  Administered 2019-09-27: 10 mL
  Filled 2019-09-27: qty 10

## 2019-09-27 MED ORDER — DEXAMETHASONE SODIUM PHOSPHATE 10 MG/ML IJ SOLN
10.0000 mg | Freq: Once | INTRAMUSCULAR | Status: AC
  Administered 2019-09-27: 10 mg via INTRAVENOUS

## 2019-09-27 MED ORDER — SODIUM CHLORIDE 0.9% FLUSH
10.0000 mL | INTRAVENOUS | Status: DC | PRN
  Administered 2019-09-27: 10 mL via INTRAVENOUS
  Filled 2019-09-27: qty 10

## 2019-09-27 MED ORDER — OXALIPLATIN CHEMO INJECTION 100 MG/20ML
65.0000 mg/m2 | Freq: Once | INTRAVENOUS | Status: AC
  Administered 2019-09-27: 10:00:00 170 mg via INTRAVENOUS
  Filled 2019-09-27: qty 34

## 2019-09-27 MED ORDER — ACETAMINOPHEN 325 MG PO TABS
650.0000 mg | ORAL_TABLET | Freq: Once | ORAL | Status: AC
  Administered 2019-09-27: 650 mg via ORAL

## 2019-09-27 MED ORDER — PALONOSETRON HCL INJECTION 0.25 MG/5ML
INTRAVENOUS | Status: AC
  Filled 2019-09-27: qty 5

## 2019-09-27 NOTE — Progress Notes (Signed)
[facility] OFFICE PROGRESS NOTE   Diagnosis: Colon cancer  INTERVAL HISTORY:   Amber Medina completed another cycle of FOLFOX beginning 09/13/2019.  No nausea/vomiting, cold sensitivity, diarrhea, or mouth sores following chemotherapy.  She reports constipation is relieved with a stool softener.  She has mild numbness in the fingers that predated chemotherapy.  This does not interfere with activity. She was discharged home from the skilled nursing facility on 09/24/2019.  She has a CNA during the day and is alone at night.  She reports she is ambulatory with a walker and able to dress herself.  She feels better compared to when she was admitted last month.  Good appetite.  Objective:  Vital signs in last 24 hours:  Blood pressure (!) 143/79, pulse 90, temperature 98.2 F (36.8 C), temperature source Temporal, resp. rate 18, height 5' 7"  (1.702 m), weight 290 lb 3.2 oz (131.6 kg), SpO2 98 %.    HEENT: No thrush or ulcers Cardio: Regular rate and rhythm, 2/6 systolic murmur GI: No hepatomegaly, nontender Vascular: No leg edema  Skin: Palms without erythema  Portacath/PICC-without erythema  Lab Results:  Lab Results  Component Value Date   WBC 6.9 09/27/2019   HGB 8.6 (L) 09/27/2019   HCT 30.5 (L) 09/27/2019   MCV 92.7 09/27/2019   PLT 246 09/27/2019   NEUTROABS 5.2 09/27/2019    CMP  Lab Results  Component Value Date   NA 141 09/13/2019   K 4.1 09/13/2019   CL 103 09/13/2019   CO2 28 09/13/2019   GLUCOSE 139 (H) 09/13/2019   BUN 12 09/13/2019   CREATININE 1.02 (H) 09/13/2019   CALCIUM 8.4 (L) 09/13/2019   PROT 6.5 09/13/2019   ALBUMIN 1.7 (L) 09/13/2019   AST 21 09/13/2019   ALT 14 09/13/2019   ALKPHOS 227 (H) 09/13/2019   BILITOT 0.6 09/13/2019   GFRNONAA 56 (L) 09/13/2019   GFRAA >60 09/13/2019    Lab Results  Component Value Date   CEA1 1,047.72 (H) 09/13/2019     Medications: I have reviewed the patient's current  medications.   Assessment/Plan: 1.  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), right colectomy 04/22/2018  Ascending colon, moderately differentiated mucinous adenocarcinoma, no lymphovascular perineural invasion, no tumor deposits, tumor budding-low, 0/14 lymph nodes, tumor invades into the subserosa and is focally present at the inked serosal surface (pT4), no loss of mismatch repair protein expression  Colonoscopy 06/13/2017-ascending colon mass-adenocarcinoma, polyps in the ascending and sigmoid-tubular adenomas  CT abdomen/pelvis 08/13/2019-multiple peritoneal masses compatible with metastatic disease, multiple hypodense liver lesions consistent with metastatic disease  CEA 08/13/2019 -806.0  CT biopsy of a peritoneal mass 08/15/2019, metastatic adenocarcinoma consistent with colon cancer  MSS, tumor mutation burden 5, KRAS G12V ? Cycle 1 FOLFOX 08/18/2019  ? Cycle 2 FOLFOX 08/30/2019-5-FU dose reduced secondary to mucositis ? Cycle 3 FOLFOX 09/13/2019  2.Acute pulmonary embolism and left popliteal DVT 02/05/2018 3.Iron deficiency anemia September 2018 4.Atrial fibrillation 5.Congestive heart failure 6.Hypertension 7.Morbid obesity 8.Renal failure   Disposition: Amber Medina has completed 3 cycles of FOLFOX.  She has tolerated the chemotherapy well.  Her performance status has improved.  She will complete cycle 4 FOLFOX today.  We will follow up on the CEA from today.  She will be referred for a restaging CT evaluation after cycle 5.  Amber Medina will continue home physical therapy.  She has persistent anemia secondary to chronic disease, renal insufficiency, chemotherapy, and potentially bleeding while on anticoagulation therapy.  She will continue  iron and we will consider transfusion support if the hemoglobin falls further.  25 minutes were spent with the patient today.  The majority of the time used for counseling and coordination of care.  Betsy Coder, MD  09/27/2019  8:41  AM

## 2019-09-27 NOTE — Patient Instructions (Addendum)
[facility] Discharge Instructions for Patients Receiving Chemotherapy  Today you received the following chemotherapy agents oxaliplatin, leucovorin, fluoroucil  To help prevent nausea and vomiting after your treatment, we encourage you to take your nausea medication as prescribed.   If you develop nausea and vomiting that is not controlled by your nausea medication, call the clinic.   BELOW ARE SYMPTOMS THAT SHOULD BE REPORTED IMMEDIATELY:  *FEVER GREATER THAN 100.5 F  *CHILLS WITH OR WITHOUT FEVER  NAUSEA AND VOMITING THAT IS NOT CONTROLLED WITH YOUR NAUSEA MEDICATION  *UNUSUAL SHORTNESS OF BREATH  *UNUSUAL BRUISING OR BLEEDING  TENDERNESS IN MOUTH AND THROAT WITH OR WITHOUT PRESENCE OF ULCERS  *URINARY PROBLEMS  *BOWEL PROBLEMS  UNUSUAL RASH Items with * indicate a potential emergency and should be followed up as soon as possible.  Feel free to call the clinic should you have any questions or concerns. The clinic phone number is [phone].  Please show the [place] at check-in to the Emergency Department and triage nurse.

## 2019-09-27 NOTE — Patient Instructions (Signed)

## 2019-09-27 NOTE — Progress Notes (Signed)
[facility] CSW Progress Notes  VM from [place], [place] at [facility], left after hours on Friday 12/18.  HH has been arranged through [facility].  PT will evaluate through PT on Sunday 12/20, RN and OT will follow within the week.  DME was to be delivered to home on 12/18 - 3 in 1 commode and transfer bench.  [place] CSW has linked daughter with company that installs ramps - they need exact measurements for the home need.  Company was open on Sat 12/19 and daughter was instructed to connect with them over weekend.    Edwyna Shell, LCSW Clinical Social Worker Phone:  [phone] Cell:  [phone]

## 2019-09-27 NOTE — Progress Notes (Signed)
[place] Work  [facility] representative met with patient in the infusion room to offer support and review SCAT application.  CSW nad patient completed SCAT application and was submitted by CSW.  [employer]AT will contact patient once application is reviewed and approved.    Johnnye Lana, MSW, LCSW, OSW-C Clinical Social Worker [facility] [phone]

## 2019-09-28 ENCOUNTER — Encounter: Payer: Self-pay | Admitting: General Practice

## 2019-09-28 NOTE — Progress Notes (Signed)
[place] CSW Progress Notes  SCAT application sent to [employer] transport yesterday by CSW [place].  As patient needs wheelchair transport, [employer]AT will need to make a home visit to assess ramp.  CSW left a VM for [employer] to determine how long this process is anticipated to take.  Patient cannot ride SC[employer] until assessment is completed.  Amber Shell, LCSW Clinical Social Worker Phone:  [phone]

## 2019-09-28 NOTE — Progress Notes (Signed)
[name] CSW Progress Notes  Patient has been approved for [employer] transport via wheelchair.  Home has ramp in place - per [employer], if there are no issues with the ramp, they can transport via wheelchair.  Patient is currently working with [facility] for [facility] PT and RN.  Has hired temporary CNA, is working with [place] [facility] to get more permanent CNA care in place.  Patient is concerned about her walkway which has a slope.  Was pleased with service provided by [facility] transport drivers.  Patient is adamant that she will regain her strength, uses walker around the house, wheelchair for appointments outside the home.  Reports chemotherapy side effects have been minimal (constipation) but steroids have kept her awake at night.    Edwyna Shell, LCSW Clinical Social Worker Phone:  [phone] Cell:  [phone]

## 2019-09-29 ENCOUNTER — Inpatient Hospital Stay: Payer: Medicare HMO

## 2019-09-29 ENCOUNTER — Other Ambulatory Visit: Payer: Self-pay

## 2019-09-29 VITALS — BP 111/63 | HR 76 | Temp 97.8°F | Resp 19

## 2019-09-29 DIAGNOSIS — C189 Malignant neoplasm of colon, unspecified: Secondary | ICD-10-CM

## 2019-09-29 DIAGNOSIS — Z5111 Encounter for antineoplastic chemotherapy: Secondary | ICD-10-CM | POA: Diagnosis not present

## 2019-09-29 MED ORDER — HEPARIN SOD (PORK) LOCK FLUSH 100 UNIT/ML IV SOLN
500.0000 [IU] | Freq: Once | INTRAVENOUS | Status: AC | PRN
  Administered 2019-09-29: 500 [IU]
  Filled 2019-09-29: qty 5

## 2019-09-29 MED ORDER — SODIUM CHLORIDE 0.9% FLUSH
10.0000 mL | INTRAVENOUS | Status: DC | PRN
  Administered 2019-09-29: 10 mL
  Filled 2019-09-29: qty 10

## 2019-10-04 ENCOUNTER — Telehealth: Payer: Self-pay | Admitting: Oncology

## 2019-10-04 NOTE — Telephone Encounter (Signed)
Scheduled per los. Called and spoke with patient. Confirmed appt 

## 2019-10-08 ENCOUNTER — Other Ambulatory Visit: Payer: Self-pay | Admitting: Oncology

## 2019-10-11 ENCOUNTER — Other Ambulatory Visit: Payer: Medicare HMO

## 2019-10-11 ENCOUNTER — Ambulatory Visit: Payer: Medicare HMO | Admitting: Oncology

## 2019-10-13 ENCOUNTER — Other Ambulatory Visit: Payer: Medicare HMO

## 2019-10-13 ENCOUNTER — Inpatient Hospital Stay: Payer: Medicare HMO

## 2019-10-13 ENCOUNTER — Ambulatory Visit: Payer: Medicare HMO

## 2019-10-13 ENCOUNTER — Inpatient Hospital Stay: Payer: Medicare HMO | Admitting: Nurse Practitioner

## 2019-10-13 ENCOUNTER — Ambulatory Visit: Payer: Medicare HMO | Admitting: Nurse Practitioner

## 2019-10-13 ENCOUNTER — Telehealth: Payer: Self-pay | Admitting: *Deleted

## 2019-10-13 NOTE — Progress Notes (Deleted)
[facility]   Telephone:([phone]) [phone] [phone]   Clinic Follow up Note   Patient Care Team: [name], MD as PCP - General (Internal Medicine) 10/13/2019  CHIEF COMPLAINT: Follow-up colon cancer  CURRENT THERAPY: FOLFOX chemotherapy  INTERVAL HISTORY: Amber Medina returns for follow-up and treatment as scheduled.  She completed cycle 4 FOLFOX on 09/27/2019.   REVIEW OF SYSTEMS:   Constitutional: Denies fevers, chills or abnormal weight loss Eyes: Denies blurriness of vision Ears, nose, mouth, throat, and face: Denies mucositis or sore throat Respiratory: Denies cough, dyspnea or wheezes Cardiovascular: Denies palpitation, chest discomfort or lower extremity swelling Gastrointestinal:  Denies nausea, heartburn or change in bowel habits Skin: Denies abnormal skin rashes Lymphatics: Denies new lymphadenopathy or easy bruising Neurological:Denies numbness, tingling or new weaknesses Behavioral/Psych: Mood is stable, no new changes  All other systems were reviewed with the patient and are negative.  MEDICAL HISTORY:  Past Medical History:  Diagnosis Date  . Abnormal pulmonary function test 02/2017  . Chronic diastolic (congestive) heart failure ([place]) 10/11/2015  . CKD (chronic kidney disease), stage II   . Colon cancer ([place])   . Hypertension   . Microcytic anemia    by labs 02/2017 -> adm 06/2017 with severe anemia, found to have colon CA.  . Morbid obesity ([place])   . Nocturnal hypoxemia   . Persistent atrial fibrillation ([place])   . Snoring   . Suspected sleep apnea     SURGICAL HISTORY: Past Surgical History:  Procedure Laterality Date  . COLONOSCOPY WITH PROPOFOL N/A 06/13/2017   Procedure: COLONOSCOPY WITH PROPOFOL;  Surgeon: Carol Ada, MD;  Location: [facility];  Service: Endoscopy;  Laterality: N/A;  . ESOPHAGOGASTRODUODENOSCOPY (EGD) WITH PROPOFOL N/A 06/13/2017   Procedure: ESOPHAGOGASTRODUODENOSCOPY (EGD) WITH PROPOFOL;  Surgeon: Carol Ada, MD;  Location: [facility];  Service: Endoscopy;  Laterality: N/A;  . IR IMAGING GUIDED PORT INSERTION  08/16/2019  . TONSILLECTOMY      I have reviewed the social history and family history with the patient and they are unchanged from previous note.  ALLERGIES:  is allergic to compazine [prochlorperazine edisylate] and zyrtec [cetirizine].  MEDICATIONS:  Current Outpatient Medications  Medication Sig Dispense Refill  . acetaminophen (TYLENOL) 325 MG tablet Take 3 tablets (975 mg total) by mouth every 8 (eight) hours as needed for mild pain or moderate pain (or Fever >/= 101).    Marland Kitchen amiodarone (PACERONE) 200 MG tablet TAKE 1 TABLET BY MOUTH DAILY. Please make overdue appt with Dr. Radford Pax before anymore refills. 2nd attempt (Patient taking differently: Take 200 mg by mouth daily. ) 15 tablet 0  . diltiazem (CARTIA XT) 240 MG 24 hr capsule Take 1 capsule (240 mg total) by mouth daily. Please make overdue annual appt for future refills. [phone]. 2nd attempt. (Patient taking differently: Take 240 mg by mouth daily. ) 15 capsule 0  . docusate sodium (COLACE) 100 MG capsule Take 1-2 capsules (100-200 mg total) by mouth 2 (two) times daily. (Patient not taking: Reported on 09/27/2019) 60 capsule 1  . ferrous gluconate (FERGON) 324 MG tablet Take 1 tablet (324 mg total) by mouth 3 (three) times daily with meals. 60 tablet 3  . fluticasone (FLONASE) 50 MCG/ACT nasal spray Place 1 spray into both nostrils daily. (Patient taking differently: Place 1 spray into both nostrils daily as needed. )  2  . furosemide (LASIX) 40 MG tablet Take 1 tablet (40 mg total) by mouth daily as needed for fluid or edema (sob).  60 tablet 0  . lidocaine-prilocaine (EMLA) cream Apply 1 application topically as directed. 30 g 0  . loratadine (CLARITIN) 10 MG tablet Take 1 tablet (10 mg total) by mouth daily. (Patient not taking: Reported on 09/27/2019) 30 tablet 2  . magic mouthwash SOLN Take 5 mLs by mouth 4 (four)  times daily as needed for mouth pain (swish and spit). (Patient not taking: Reported on 09/13/2019) 240 mL 0  . metoprolol succinate (TOPROL-XL) 25 MG 24 hr tablet TAKE 1 TABLET BY MOUTH EVERY DAY. PLEASE MAKE AN APPOINTMENT WITH DOCTOR TURNER FOR FURHTER REFILLS. (Patient taking differently: Take 25 mg by mouth daily. ) 30 tablet 0  . ondansetron (ZOFRAN) 8 MG tablet Take 1 tablet (8 mg total) by mouth every 8 (eight) hours as needed for nausea or vomiting. (Patient not taking: Reported on 09/13/2019) 20 tablet 0  . rivaroxaban (XARELTO) 20 MG TABS tablet Take 20 mg by mouth daily with supper.      No current facility-administered medications for this visit.    PHYSICAL EXAMINATION: ECOG PERFORMANCE STATUS: {CHL ONC ECOG PS:[phone]}  There were no vitals filed for this visit. There were no vitals filed for this visit.  GENERAL:alert, no distress and comfortable SKIN: skin color, texture, turgor are normal, no rashes or significant lesions EYES: normal, Conjunctiva are pink and non-injected, sclera clear OROPHARYNX:no exudate, no erythema and lips, buccal mucosa, and tongue normal  NECK: supple, thyroid normal size, non-tender, without nodularity LYMPH:  no palpable lymphadenopathy in the cervical, axillary or inguinal LUNGS: clear to auscultation and percussion with normal breathing effort HEART: regular rate & rhythm and no murmurs and no lower extremity edema ABDOMEN:abdomen soft, non-tender and normal bowel sounds Musculoskeletal:no cyanosis of digits and no clubbing  NEURO: alert & oriented x 3 with fluent speech, no focal motor/sensory deficits  LABORATORY DATA:  I have reviewed the data as listed CBC Latest Ref Rng & Units 09/27/2019 09/13/2019 08/30/2019  WBC 4.0 - 10.5 K/uL 6.9 7.0 6.5  Hemoglobin 12.0 - 15.0 g/dL 8.6(L) 8.8(L) 9.4(L)  Hematocrit 36.0 - 46.0 % 30.5(L) 30.5(L) 31.8(L)  Platelets 150 - 400 K/uL 246 463(H) 278     CMP Latest Ref Rng & Units 09/27/2019  09/13/2019 08/30/2019  Glucose 70 - 99 mg/dL 183(H) 139(H) 148(H)  BUN 8 - 23 mg/dL 15 12 13   Creatinine 0.44 - 1.00 mg/dL 0.96 1.02(H) 1.02(H)  Sodium 135 - 145 mmol/L 141 141 140  Potassium 3.5 - 5.1 mmol/L 3.6 4.1 4.0  Chloride 98 - 111 mmol/L 107 103 105  CO2 22 - 32 mmol/L 22 28 24   Calcium 8.9 - 10.3 mg/dL 8.4(L) 8.4(L) 8.2(L)  Total Protein 6.5 - 8.1 g/dL 6.9 6.5 5.9(L)  Total Bilirubin 0.3 - 1.2 mg/dL 0.6 0.6 0.8  Alkaline Phos 38 - 126 U/L 331(H) 227(H) 226(H)  AST 15 - 41 U/L 26 21 40  ALT 0 - 44 U/L 18 14 18       RADIOGRAPHIC STUDIES: I have personally reviewed the radiological images as listed and agreed with the findings in the report. No results found.   ASSESSMENT & PLAN:   1.  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), right colectomy 04/22/2018  Ascending colon, moderately differentiated mucinous adenocarcinoma, no lymphovascular perineural invasion, no tumor deposits, tumor budding-low, 0/14 lymph nodes, tumor invades into the subserosa and is focally present at the inked serosal surface (pT4), no loss of mismatch repair protein expression  Colonoscopy 06/13/2017-ascending colon mass-adenocarcinoma, polyps in the ascending and sigmoid-tubular  adenomas  CT abdomen/pelvis 08/13/2019-multiple peritoneal masses compatible with metastatic disease, multiple hypodense liver lesions consistent with metastatic disease  CEA 08/13/2019 -806.0  CT biopsy of a peritoneal mass 08/15/2019, metastatic adenocarcinoma consistent with colon cancer  MSS, tumor mutation burden 5, KRAS G12V ? Cycle 1 FOLFOX 08/18/2019  ? Cycle 2 FOLFOX 08/30/2019-5-FU dose reduced secondary to mucositis ? Cycle 3 FOLFOX 09/13/2019  2.Acute pulmonary embolism and left popliteal DVT 02/05/2018 3.Iron deficiency anemia September 2018 4.Atrial fibrillation 5.Congestive heart failure 6.Hypertension 7.Morbid obesity 8.Renal failure  Disposition:   No problem-specific Assessment & Plan notes found  for this encounter.   No orders of the defined types were placed in this encounter.  All questions were answered. The patient knows to call the clinic with any problems, questions or concerns. No barriers to learning was detected. I spent {CHL ONC TIME VISIT - RFFMB:[id number]} counseling the patient face to face. The total time spent in the appointment was {CHL ONC TIME VISIT - SVXBL:[id number]} and more than 50% was on counseling and review of test results     Alla Feeling, NP 10/13/19

## 2019-10-13 NOTE — Telephone Encounter (Signed)
Called patient to f/u on her message from answering service canceling her appointments today. She developed N/V last night. No diarrhea. Does not think she has a fever (can't find her thermometer), but is having some mild chills and sweats. Denies any shortness of breath. Suggested she have her aid pick up thermometer on the way to her home today and she reports the agency dropped her 2 weeks ago citing her home was infested with bugs, of which she denies. She has another agency come tomorrow for potential admission to their services. She is able to prepare her food in microwave and will work on clear liquids today. She agrees to call if her symptoms worsen or do not improve.

## 2019-10-15 ENCOUNTER — Inpatient Hospital Stay: Payer: Medicare HMO

## 2019-10-18 ENCOUNTER — Telehealth: Payer: Self-pay | Admitting: *Deleted

## 2019-10-18 MED ORDER — AMIODARONE HCL 200 MG PO TABS: 200.0000 mg | ORAL_TABLET | Freq: Every day | ORAL | 1 refills | Status: DC

## 2019-10-18 MED ORDER — RIVAROXABAN 20 MG PO TABS: 20.0000 mg | ORAL_TABLET | Freq: Every day | ORAL | 1 refills | Status: AC

## 2019-10-18 MED ORDER — FERROUS GLUCONATE 324 (38 FE) MG PO TABS: 324.0000 mg | ORAL_TABLET | Freq: Three times a day (TID) | ORAL | 3 refills | Status: AC

## 2019-10-18 NOTE — Telephone Encounter (Signed)
Notified of refills. °

## 2019-10-18 NOTE — Telephone Encounter (Signed)
Pt called requesting a refill of amidarone, xarelto and ferrous gluconate.  States she has not seen PCP in over a year due to transportation and mobility issues. PCP does not do phone visits. Has to get reestablished with [facility] cardiology group as she has changed insurance. I encouraged her to call today to get set up with the cardiologist.

## 2019-10-19 ENCOUNTER — Telehealth: Payer: Self-pay

## 2019-10-19 ENCOUNTER — Telehealth (INDEPENDENT_AMBULATORY_CARE_PROVIDER_SITE_OTHER): Payer: Medicare HMO | Admitting: Cardiology

## 2019-10-19 ENCOUNTER — Other Ambulatory Visit: Payer: Self-pay

## 2019-10-19 ENCOUNTER — Encounter: Payer: Self-pay | Admitting: Cardiology

## 2019-10-19 ENCOUNTER — Telehealth: Payer: Self-pay | Admitting: *Deleted

## 2019-10-19 VITALS — BP 104/65 | HR 84 | Temp 97.3°F | Ht 67.0 in | Wt 270.0 lb

## 2019-10-19 DIAGNOSIS — I4819 Other persistent atrial fibrillation: Secondary | ICD-10-CM

## 2019-10-19 DIAGNOSIS — N1831 Chronic kidney disease, stage 3a: Secondary | ICD-10-CM

## 2019-10-19 DIAGNOSIS — I5032 Chronic diastolic (congestive) heart failure: Secondary | ICD-10-CM | POA: Diagnosis not present

## 2019-10-19 NOTE — Telephone Encounter (Addendum)
Called and left message that her caregiver being arranged by agency has not been to home yet and she has no way to get out of the house tomorrow to come for her treatment. Says "I have been practically bedbound for the last 5 days due to a foot problem". Says she is anxious about what are her options. Requests call to explore other options to get to appointment. Only potential option this RN is aware of is [facility]--sent message to CSW for suggestions. Patient called back and reported "I have solved my issue. I will be there tomorrow".

## 2019-10-19 NOTE — Telephone Encounter (Signed)
YOUR CARDIOLOGY TEAM HAS ARRANGED FOR AN E-VISIT FOR YOUR APPOINTMENT - PLEASE REVIEW IMPORTANT INFORMATION BELOW SEVERAL DAYS PRIOR TO YOUR APPOINTMENT  Due to the recent COVID-19 pandemic, we are transitioning in-person office visits to tele-medicine visits in an effort to decrease unnecessary exposure to our patients, their families, and staff. These visits are billed to your insurance just like a normal visit is. We also encourage you to sign up for MyChart if you have not already done so. You will need a smartphone if possible. For patients that do not have this, we can still complete the visit using a regular telephone but do prefer a smartphone to enable video when possible. You may have a family member that lives with you that can help. If possible, we also ask that you have a blood pressure cuff and scale at home to measure your blood pressure, heart rate and weight prior to your scheduled appointment. Patients with clinical needs that need an in-person evaluation and testing will still be able to come to the office if absolutely necessary. If you have any questions, feel free to call our office.     THE DAY OF YOUR APPOINTMENT  Approximately 15 minutes prior to your scheduled appointment, you will receive a telephone call from one of [facility] team - your caller ID may say "Unknown caller."  Our staff will confirm medications, vital signs for the day and any symptoms you may be experiencing. Please have this information available prior to the time of visit start. It may also be helpful for you to have a pad of paper and pen handy for any instructions given during your visit. They will also walk you through joining the smartphone meeting if this is a video visit.    CONSENT FOR TELE-HEALTH VISIT - PLEASE REVIEW  I hereby voluntarily request, consent and authorize [facility] and its employed or contracted physicians, physician assistants, nurse practitioners or other licensed health care  professionals (the Practitioner), to provide me with telemedicine health care services (the "Services") as deemed necessary by the treating Practitioner. I acknowledge and consent to receive the Services by the Practitioner via telemedicine. I understand that the telemedicine visit will involve communicating with the Practitioner through live audiovisual communication technology and the disclosure of certain medical information by electronic transmission. I acknowledge that I have been given the opportunity to request an in-person assessment or other available alternative prior to the telemedicine visit and am voluntarily participating in the telemedicine visit.  I understand that I have the right to withhold or withdraw my consent to the use of telemedicine in the course of my care at any time, without affecting my right to future care or treatment, and that the Practitioner or I may terminate the telemedicine visit at any time. I understand that I have the right to inspect all information obtained and/or recorded in the course of the telemedicine visit and may receive copies of available information for a reasonable fee.  I understand that some of the potential risks of receiving the Services via telemedicine include:  Marland Kitchen Delay or interruption in medical evaluation due to technological equipment failure or disruption; . Information transmitted may not be sufficient (e.g. poor resolution of images) to allow for appropriate medical decision making by the Practitioner; and/or  . In rare instances, security protocols could fail, causing a breach of personal health information.  Furthermore, I acknowledge that it is my responsibility to provide information about my medical history, conditions and care that is  complete and accurate to the best of my ability. I acknowledge that Practitioner's advice, recommendations, and/or decision may be based on factors not within their control, such as incomplete or inaccurate  data provided by me or distortions of diagnostic images or specimens that may result from electronic transmissions. I understand that the practice of medicine is not an exact science and that Practitioner makes no warranties or guarantees regarding treatment outcomes. I acknowledge that I will receive a copy of this consent concurrently upon execution via email to the email address I last provided but may also request a printed copy by calling the office of [place] [facility].    I understand that my insurance will be billed for this visit.   I have read or had this consent read to me. . I understand the contents of this consent, which adequately explains the benefits and risks of the Services being provided via telemedicine.  . I have been provided ample opportunity to ask questions regarding this consent and the Services and have had my questions answered to my satisfaction. . I give my informed consent for the services to be provided through the use of telemedicine in my medical care  By participating in this telemedicine visit I agree to the above.

## 2019-10-19 NOTE — Patient Instructions (Signed)
Medication Instructions:  Your physician recommends that you continue on your current medications as directed. Please refer to the Current Medication list given to you today.  *If you need a refill on your cardiac medications before your next appointment, please call your pharmacy*  Follow-Up: At [facility], you and your health needs are our priority.  As part of our continuing mission to provide you with exceptional heart care, we have created designated Provider Care Teams.  These Care Teams include your primary Cardiologist (physician) and Advanced Practice Providers (APPs -  Physician Assistants and Nurse Practitioners) who all work together to provide you with the care you need, when you need it.  Your next appointment:   6 month(s)  The format for your next appointment:   Either In Person or Virtual  Provider:   You may see No primary care provider on file. or one of the following Advanced Practice Providers on your designated Care Team:    Melina Copa, PA-C  Ermalinda Barrios, PA-C

## 2019-10-19 NOTE — Progress Notes (Signed)
Virtual Visit via Telephone Note   This visit type was conducted due to national recommendations for restrictions regarding the COVID-19 Pandemic (e.g. social distancing) in an effort to limit this patient's exposure and mitigate transmission in our community.  Due to her co-morbid illnesses, this patient is at least at moderate risk for complications without adequate follow up.  This format is felt to be most appropriate for this patient at this time.  The patient did not have access to video technology/had technical difficulties with video requiring transitioning to audio format only (telephone).  All issues noted in this document were discussed and addressed.  No physical exam could be performed with this format.  Please refer to the patient's chart for her  consent to telehealth for [facility].   Evaluation Performed:  Follow-up visit  This visit type was conducted due to national recommendations for restrictions regarding the COVID-19 Pandemic (e.g. social distancing).  This format is felt to be most appropriate for this patient at this time.  All issues noted in this document were discussed and addressed.  No physical exam was performed (except for noted visual exam findings with Video Visits).  Please refer to the patient's chart (MyChart message for video visits and phone note for telephone visits) for the patient's consent to telehealth for [facility].  Date:  10/19/2019   ID:  Amber Medina, DOB Nov 26, 1950, MRN [id number]  Patient Location:  [facility]  Provider location:   [place]  PCP:  [name], MD  Cardiologist:  Fransico Him, MD Electrophysiologist:  None   Chief Complaint:  CHF, afib  History of Present Illness:    Amber Medina is a 69 y.o. female who presents via audio/video conferencing for a telehealth visit today.    Amber Medina is a 69 y.o. 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  She had been doing well but last spring started having fatigue and feeling poorly. She thought is was due to being sedentary with COVID but it got so bad that she went to her MD and was found to have Stage 4 colon CA to her liver and recurrent in her bowel.  She is currently on Chemo.   She denies any chest pain or pressure,  PND, orthopnea, LE edema, dizziness, palpitations or syncope. SHe has had some problems with DOE due to being bedridden while sick and she lost a low of weight and is very deconditioned. She is compliant with his meds and is tolerating meds with no SE.    The patient does not have symptoms concerning for COVID-19 infection (fever, chills, cough, or new shortness of breath).   Prior CV studies:   The following studies were reviewed today:  labs  Past Medical History:  Diagnosis Date  . Abnormal pulmonary function test 02/2017  . Chronic diastolic (congestive) heart failure ([place]) 10/11/2015  . CKD (chronic kidney disease), stage II   . Colon cancer ([place])   . Hypertension   . Microcytic anemia    by labs 02/2017 -> adm 06/2017 with severe anemia, found to have colon CA.  . Morbid obesity ([place])   . Nocturnal hypoxemia   . Persistent atrial fibrillation ([place])   . Snoring   . Suspected sleep apnea    Past Surgical History:  Procedure Laterality Date  .  COLONOSCOPY WITH PROPOFOL N/A 06/13/2017   Procedure: COLONOSCOPY WITH PROPOFOL;  Surgeon: Carol Ada, MD;  Location: [facility];  Service: Endoscopy;  Laterality: N/A;  . ESOPHAGOGASTRODUODENOSCOPY (EGD) WITH PROPOFOL N/A 06/13/2017   Procedure: ESOPHAGOGASTRODUODENOSCOPY (EGD) WITH PROPOFOL;  Surgeon: Carol Ada, MD;  Location: [facility];  Service: Endoscopy;  Laterality: N/A;  . IR IMAGING GUIDED PORT INSERTION  08/16/2019  . TONSILLECTOMY       Current Meds  Medication Sig  .  acetaminophen (TYLENOL) 325 MG tablet Take 3 tablets (975 mg total) by mouth every 8 (eight) hours as needed for mild pain or moderate pain (or Fever >/= 101).  Marland Kitchen amiodarone (PACERONE) 200 MG tablet Take 1 tablet (200 mg total) by mouth daily. (Patient taking differently: Take 100 mg by mouth daily. )  . diltiazem (CARTIA XT) 240 MG 24 hr capsule Take 1 capsule (240 mg total) by mouth daily. Please make overdue annual appt for future refills. [phone]. 2nd attempt. (Patient taking differently: Take 240 mg by mouth daily. )  . ferrous gluconate (FERGON) 324 MG tablet Take 1 tablet (324 mg total) by mouth 3 (three) times daily with meals.  . fluticasone (FLONASE) 50 MCG/ACT nasal spray Place 1 spray into both nostrils daily. (Patient taking differently: Place 1 spray into both nostrils daily as needed. )  . furosemide (LASIX) 40 MG tablet Take 1 tablet (40 mg total) by mouth daily as needed for fluid or edema (sob).  Marland Kitchen lidocaine-prilocaine (EMLA) cream Apply 1 application topically as directed.  . metoprolol succinate (TOPROL-XL) 25 MG 24 hr tablet TAKE 1 TABLET BY MOUTH EVERY DAY. PLEASE MAKE AN APPOINTMENT WITH DOCTOR Jaquin Coy FOR FURHTER REFILLS. (Patient taking differently: Take 25 mg by mouth daily. )  . rivaroxaban (XARELTO) 20 MG TABS tablet Take 1 tablet (20 mg total) by mouth daily with supper.     Allergies:   Compazine [prochlorperazine edisylate] and Zyrtec [cetirizine]   Social History   Tobacco Use  . Smoking status: Former Research scientist (life sciences)  . Smokeless tobacco: Never Used  . Tobacco comment: quit in 2013  Substance Use Topics  . Alcohol use: Yes    Comment: occ  . Drug use: No     Family Hx: The patient's family history includes Congenital heart disease in her mother; Diabetes Mellitus II in her mother; Hypertension in her father and mother.  ROS:   Please see the history of present illness.     All other systems reviewed and are negative.   Labs/Other Tests and Data Reviewed:     Recent Labs: 08/13/2019: B Natriuretic Peptide 85.6; TSH 4.119 08/23/2019: Magnesium 1.8 09/27/2019: ALT 18; BUN 15; Creatinine 0.96; Hemoglobin 8.6; Platelet Count 246; Potassium 3.6; Sodium 141   Recent Lipid Panel Lab Results  Component Value Date/Time   CHOL 155 09/09/2015 04:14 AM   TRIG 116 09/09/2015 04:14 AM   HDL 33 (L) 09/09/2015 04:14 AM   CHOLHDL 4.7 09/09/2015 04:14 AM   LDLCALC 99 09/09/2015 04:14 AM    Wt Readings from Last 3 Encounters:  10/18/19 270 lb (122.5 kg)  09/27/19 290 lb 3.2 oz (131.6 kg)  08/30/19 (!) 315 lb 6.4 oz (143.1 kg)     Objective:    Vital Signs:  BP 104/65   Pulse 84   Temp (!) 97.3 F (36.3 C)   Ht 5\' 7"  (1.702 m)   Wt 270 lb (122.5 kg)   LMP  (LMP Unknown)   SpO2 97%   BMI 42.29 kg/m  ASSESSMENT & PLAN:    1.  Paroxysmal atrial fibrillation -she denies any palpitations -continue Toprol XL 25mg  daily, Cardizem 240mg  daily, Amio 100mg  daily and Xarelto 20mg  daily -denies any problems with bleeding on DOAC -creatinine 0.96 and Hbg stable at 8.6 -ALT normal at 18 in December and TSH normal at 4.11 in Nov 2020  2.  Chronic diastolic CHF -weight is stable -denies any SOB or LE edema -continue Lasix PRN  3.  CKD stage 3a -creatinine stable at 0.96  4.  Morbid Obesity -I have encouraged him to get into a routine exercise program and cut back on carbs and portions.   5.  Chronic anemia -Hbg 8.6 and followed by Oncology -secondary to metastatic colon CA  6.  Metastatic colon CA -mets to liver and bowel -on chemo  COVID-19 Education: The signs and symptoms of COVID-19 were discussed with the patient and how to seek care for testing (follow up with PCP or arrange E-visit).  The importance of social distancing was discussed today.  Patient Risk:   After full review of this patient's clinical status, I feel that they are at least moderate risk at this time.  Time:   Today, I have spent 20 minutes directly with the  patient on telemedicine discussing medical problems including PAF, CHF, CKD.  We also reviewed the symptoms of COVID 19 and the ways to protect against contracting the virus with telehealth technology.  I spent an additional 5 minutes reviewing patient's chart including labs.  Medication Adjustments/Labs and Tests Ordered: Current medicines are reviewed at length with the patient today.  Concerns regarding medicines are outlined above.  Tests Ordered: No orders of the defined types were placed in this encounter.  Medication Changes: No orders of the defined types were placed in this encounter.   Disposition:  Follow up 6 months  Signed, Fransico Him, MD  10/19/2019 8:52 AM    [facility]

## 2019-10-20 ENCOUNTER — Inpatient Hospital Stay: Payer: Medicare HMO

## 2019-10-20 ENCOUNTER — Inpatient Hospital Stay (HOSPITAL_COMMUNITY)
Admission: EM | Admit: 2019-10-20 | Discharge: 2019-10-28 | DRG: 312 | Disposition: A | Discharge status: Skilled Nursing Facility | Payer: Medicare HMO | Attending: Internal Medicine | Admitting: Internal Medicine

## 2019-10-20 ENCOUNTER — Inpatient Hospital Stay: Payer: Medicare HMO | Admitting: Nurse Practitioner

## 2019-10-20 ENCOUNTER — Encounter (HOSPITAL_COMMUNITY): Payer: Self-pay

## 2019-10-20 ENCOUNTER — Other Ambulatory Visit: Payer: Self-pay

## 2019-10-20 ENCOUNTER — Emergency Department (HOSPITAL_COMMUNITY): Payer: Medicare HMO

## 2019-10-20 DIAGNOSIS — Z86711 Personal history of pulmonary embolism: Secondary | ICD-10-CM

## 2019-10-20 DIAGNOSIS — I4819 Other persistent atrial fibrillation: Secondary | ICD-10-CM | POA: Diagnosis present

## 2019-10-20 DIAGNOSIS — D72829 Elevated white blood cell count, unspecified: Secondary | ICD-10-CM | POA: Diagnosis present

## 2019-10-20 DIAGNOSIS — C182 Malignant neoplasm of ascending colon: Secondary | ICD-10-CM | POA: Diagnosis present

## 2019-10-20 DIAGNOSIS — F418 Other specified anxiety disorders: Secondary | ICD-10-CM | POA: Diagnosis present

## 2019-10-20 DIAGNOSIS — Z7189 Other specified counseling: Secondary | ICD-10-CM | POA: Diagnosis not present

## 2019-10-20 DIAGNOSIS — K521 Toxic gastroenteritis and colitis: Secondary | ICD-10-CM | POA: Diagnosis present

## 2019-10-20 DIAGNOSIS — Z9221 Personal history of antineoplastic chemotherapy: Secondary | ICD-10-CM

## 2019-10-20 DIAGNOSIS — Z6841 Body Mass Index (BMI) 40.0 and over, adult: Secondary | ICD-10-CM | POA: Diagnosis not present

## 2019-10-20 DIAGNOSIS — I110 Hypertensive heart disease with heart failure: Secondary | ICD-10-CM | POA: Diagnosis present

## 2019-10-20 DIAGNOSIS — I712 Thoracic aortic aneurysm, without rupture: Secondary | ICD-10-CM | POA: Diagnosis present

## 2019-10-20 DIAGNOSIS — D508 Other iron deficiency anemias: Secondary | ICD-10-CM

## 2019-10-20 DIAGNOSIS — C189 Malignant neoplasm of colon, unspecified: Secondary | ICD-10-CM

## 2019-10-20 DIAGNOSIS — R55 Syncope and collapse: Principal | ICD-10-CM | POA: Diagnosis present

## 2019-10-20 DIAGNOSIS — I5032 Chronic diastolic (congestive) heart failure: Secondary | ICD-10-CM | POA: Diagnosis present

## 2019-10-20 DIAGNOSIS — Z8249 Family history of ischemic heart disease and other diseases of the circulatory system: Secondary | ICD-10-CM

## 2019-10-20 DIAGNOSIS — T451X5A Adverse effect of antineoplastic and immunosuppressive drugs, initial encounter: Secondary | ICD-10-CM | POA: Diagnosis present

## 2019-10-20 DIAGNOSIS — Z20822 Contact with and (suspected) exposure to covid-19: Secondary | ICD-10-CM | POA: Diagnosis present

## 2019-10-20 DIAGNOSIS — E43 Unspecified severe protein-calorie malnutrition: Secondary | ICD-10-CM | POA: Diagnosis present

## 2019-10-20 DIAGNOSIS — Z833 Family history of diabetes mellitus: Secondary | ICD-10-CM

## 2019-10-20 DIAGNOSIS — R5381 Other malaise: Secondary | ICD-10-CM | POA: Diagnosis present

## 2019-10-20 DIAGNOSIS — C787 Secondary malignant neoplasm of liver and intrahepatic bile duct: Secondary | ICD-10-CM | POA: Diagnosis present

## 2019-10-20 DIAGNOSIS — D72820 Lymphocytosis (symptomatic): Secondary | ICD-10-CM

## 2019-10-20 DIAGNOSIS — Z86718 Personal history of other venous thrombosis and embolism: Secondary | ICD-10-CM | POA: Diagnosis not present

## 2019-10-20 DIAGNOSIS — Z515 Encounter for palliative care: Secondary | ICD-10-CM | POA: Diagnosis not present

## 2019-10-20 DIAGNOSIS — M1990 Unspecified osteoarthritis, unspecified site: Secondary | ICD-10-CM | POA: Diagnosis present

## 2019-10-20 DIAGNOSIS — D638 Anemia in other chronic diseases classified elsewhere: Secondary | ICD-10-CM | POA: Diagnosis present

## 2019-10-20 DIAGNOSIS — I10 Essential (primary) hypertension: Secondary | ICD-10-CM

## 2019-10-20 LAB — TROPONIN I (HIGH SENSITIVITY)
Troponin I (High Sensitivity): 12 ng/L (ref <18)
Troponin I (High Sensitivity): 10 ng/L (ref <18)

## 2019-10-20 LAB — CBC WITH DIFFERENTIAL/PLATELET
Abs Immature Granulocytes: 1.73 10*3/uL — ABNORMAL HIGH (ref 0.00–0.07)
Basophils Absolute: 0.1 10*3/uL (ref 0.0–0.1)
Basophils Relative: 0 %
Eosinophils Absolute: 0 10*3/uL (ref 0.0–0.5)
Eosinophils Relative: 0 %
HCT: 29.8 % — ABNORMAL LOW (ref 36.0–46.0)
Hemoglobin: 8.4 g/dL — ABNORMAL LOW (ref 12.0–15.0)
Immature Granulocytes: 6 %
Lymphocytes Relative: 4 %
Lymphs Abs: 1.2 10*3/uL (ref 0.7–4.0)
MCH: 27.6 pg (ref 26.0–34.0)
MCHC: 28.2 g/dL — ABNORMAL LOW (ref 30.0–36.0)
MCV: 98 fL (ref 80.0–100.0)
Monocytes Absolute: 1.9 10*3/uL — ABNORMAL HIGH (ref 0.1–1.0)
Monocytes Relative: 6 %
Neutro Abs: 26.2 10*3/uL — ABNORMAL HIGH (ref 1.7–7.7)
Neutrophils Relative %: 84 %
Platelets: 237 10*3/uL (ref 150–400)
RBC: 3.04 MIL/uL — ABNORMAL LOW (ref 3.87–5.11)
RDW: 20.6 % — ABNORMAL HIGH (ref 11.5–15.5)
WBC: 31.2 10*3/uL — ABNORMAL HIGH (ref 4.0–10.5)
nRBC: 0.1 % (ref 0.0–0.2)

## 2019-10-20 LAB — RESPIRATORY PANEL BY RT PCR (FLU A&B, COVID)
Influenza A by PCR: NEGATIVE
Influenza B by PCR: NEGATIVE
SARS Coronavirus 2 by RT PCR: NEGATIVE

## 2019-10-20 LAB — COMPREHENSIVE METABOLIC PANEL
ALT: 11 U/L (ref 0–44)
AST: 27 U/L (ref 15–41)
Albumin: 1.7 g/dL — ABNORMAL LOW (ref 3.5–5.0)
Alkaline Phosphatase: 213 U/L — ABNORMAL HIGH (ref 38–126)
Anion gap: 17 — ABNORMAL HIGH (ref 5–15)
BUN: 12 mg/dL (ref 8–23)
CO2: 20 mmol/L — ABNORMAL LOW (ref 22–32)
Calcium: 8.3 mg/dL — ABNORMAL LOW (ref 8.9–10.3)
Chloride: 99 mmol/L (ref 98–111)
Creatinine, Ser: 0.95 mg/dL (ref 0.44–1.00)
GFR calc Af Amer: >60 mL/min (ref >60)
GFR calc non Af Amer: >60 mL/min (ref >60)
Glucose, Bld: 196 mg/dL — ABNORMAL HIGH (ref 70–99)
Potassium: 3.5 mmol/L (ref 3.5–5.1)
Sodium: 136 mmol/L (ref 135–145)
Total Bilirubin: 1.1 mg/dL (ref 0.3–1.2)
Total Protein: 6.4 g/dL — ABNORMAL LOW (ref 6.5–8.1)

## 2019-10-20 LAB — PROTIME-INR
INR: 1.6 — ABNORMAL HIGH (ref 0.8–1.2)
Prothrombin Time: 18.5 seconds — ABNORMAL HIGH (ref 11.4–15.2)

## 2019-10-20 LAB — BRAIN NATRIURETIC PEPTIDE: B Natriuretic Peptide: 179.9 pg/mL — ABNORMAL HIGH (ref 0.0–100.0)

## 2019-10-20 LAB — LIPASE, BLOOD: Lipase: 16 U/L (ref 11–51)

## 2019-10-20 MED ORDER — RIVAROXABAN 20 MG PO TABS
20.0000 mg | ORAL_TABLET | Freq: Every day | ORAL | Status: DC
  Administered 2019-10-20 – 2019-10-27 (×8): 20 mg via ORAL
  Filled 2019-10-20 (×8): qty 1

## 2019-10-20 MED ORDER — METOPROLOL SUCCINATE ER 25 MG PO TB24
25.0000 mg | ORAL_TABLET | Freq: Every day | ORAL | Status: DC
  Administered 2019-10-21 – 2019-10-27 (×7): 25 mg via ORAL
  Filled 2019-10-20 (×8): qty 1

## 2019-10-20 MED ORDER — IOHEXOL 350 MG/ML SOLN
100.0000 mL | Freq: Once | INTRAVENOUS | Status: AC | PRN
  Administered 2019-10-20: 70 mL via INTRAVENOUS

## 2019-10-20 MED ORDER — DILTIAZEM HCL ER COATED BEADS 240 MG PO CP24
240.0000 mg | ORAL_CAPSULE | Freq: Every day | ORAL | Status: DC
  Administered 2019-10-21 – 2019-10-27 (×7): 240 mg via ORAL
  Filled 2019-10-20 (×8): qty 1

## 2019-10-20 MED ORDER — ACETAMINOPHEN 650 MG RE SUPP: 650.0000 mg | Freq: Four times a day (QID) | RECTAL | Status: DC | PRN

## 2019-10-20 MED ORDER — SODIUM CHLORIDE 0.9% FLUSH: 3.0000 mL | INTRAVENOUS | Status: DC | PRN

## 2019-10-20 MED ORDER — SODIUM CHLORIDE 0.9% FLUSH
3.0000 mL | Freq: Two times a day (BID) | INTRAVENOUS | Status: DC
  Administered 2019-10-20 – 2019-10-28 (×8): 3 mL via INTRAVENOUS

## 2019-10-20 MED ORDER — SODIUM CHLORIDE (PF) 0.9 % IJ SOLN
INTRAMUSCULAR | Status: AC
  Filled 2019-10-20: qty 50

## 2019-10-20 MED ORDER — FERROUS GLUCONATE 324 (38 FE) MG PO TABS
324.0000 mg | ORAL_TABLET | Freq: Three times a day (TID) | ORAL | Status: DC
  Administered 2019-10-20 – 2019-10-28 (×20): 324 mg via ORAL
  Filled 2019-10-20 (×25): qty 1

## 2019-10-20 MED ORDER — ACETAMINOPHEN 500 MG PO TABS
1000.0000 mg | ORAL_TABLET | Freq: Once | ORAL | Status: AC
  Administered 2019-10-20: 1000 mg via ORAL
  Filled 2019-10-20: qty 2

## 2019-10-20 MED ORDER — SODIUM CHLORIDE 0.9 % IV SOLN: 250.0000 mL | INTRAVENOUS | Status: DC | PRN

## 2019-10-20 MED ORDER — AMIODARONE HCL 200 MG PO TABS
100.0000 mg | ORAL_TABLET | Freq: Every day | ORAL | Status: DC
  Administered 2019-10-21 – 2019-10-27 (×7): 100 mg via ORAL
  Filled 2019-10-20 (×9): qty 1

## 2019-10-20 MED ORDER — SODIUM CHLORIDE 0.9 % IV BOLUS
500.0000 mL | Freq: Once | INTRAVENOUS | Status: AC
  Administered 2019-10-20: 500 mL via INTRAVENOUS

## 2019-10-20 MED ORDER — ACETAMINOPHEN 325 MG PO TABS
650.0000 mg | ORAL_TABLET | Freq: Four times a day (QID) | ORAL | Status: DC | PRN
  Administered 2019-10-25 – 2019-10-27 (×4): 650 mg via ORAL
  Filled 2019-10-20 (×4): qty 2

## 2019-10-20 NOTE — ED Provider Notes (Addendum)
[place] DEPT Provider Note   CSN: [id number] Arrival date & time: 10/20/19  [id number]     History Chief Complaint  Patient presents with  . Dizziness    Amber Medina is a 69 y.o. female.  69 year old female with prior medical history as detailed below presents for evaluation of the syncopal event and associated weakness.  Patient reports that she has been at home where she lives by herself.  She reports that she was due for a chemo session today.  She was attempting to go to her chemo session when she experienced significant weakness and almost passed out.  She does report that she has been mostly bedbound for the last several days secondary to arthritic pain in the left foot.  She denies associated chest discomfort.  She reports that her symptoms are significantly improved upon ED arrival.  She denies known Covid exposure.  She denies actual full syncope.  She denies fever, cough, shortness of breath.  The history is provided by the patient and medical records.  Near Syncope This is a new problem. The current episode started 1 to 2 hours ago. The problem occurs rarely. The problem has been gradually improving. Pertinent negatives include no chest pain, no headaches and no shortness of breath. Nothing aggravates the symptoms. Nothing relieves the symptoms.       Past Medical History:  Diagnosis Date  . Abnormal pulmonary function test 02/2017  . Chronic diastolic (congestive) heart failure ([place]) 10/11/2015  . CKD (chronic kidney disease), stage II   . Colon cancer ([place])   . Hypertension   . Microcytic anemia    by labs 02/2017 -> adm 06/2017 with severe anemia, found to have colon CA.  . Morbid obesity ([place])   . Nocturnal hypoxemia   . Persistent atrial fibrillation ([place])   . Snoring   . Suspected sleep apnea     Patient Active Problem List   Diagnosis Date Noted  . Colon cancer metastasized to liver ([place]) 08/17/2019  . Goals of care,  counseling/discussion 08/17/2019  . Liver lesion 08/13/2019  . Abdominal distension 08/13/2019  . Acute pulmonary embolism with acute cor pulmonale ([place]) 02/05/2018  . Adenocarcinoma of colon ([place]) 02/05/2018  . Colonic mass 06/15/2017  . Anemia 06/11/2017  . Chronic diastolic (congestive) heart failure ([place]) 10/11/2015  . Persistent atrial fibrillation ([place]) 10/11/2015  . OSA (obstructive sleep apnea)   . Acute respiratory failure with hypoxia ([place])   . Hyperglycemia 09/06/2015  . Anasarca 09/06/2015  . Obesity, morbid ([place]) 09/06/2015    Past Surgical History:  Procedure Laterality Date  . COLONOSCOPY WITH PROPOFOL N/A 06/13/2017   Procedure: COLONOSCOPY WITH PROPOFOL;  Surgeon: Carol Ada, MD;  [facility];  Service: Endoscopy;  Laterality: N/A;  . ESOPHAGOGASTRODUODENOSCOPY (EGD) WITH PROPOFOL N/A 06/13/2017   Procedure: ESOPHAGOGASTRODUODENOSCOPY (EGD) WITH PROPOFOL;  Surgeon: Carol Ada, MD;  [facility];  Service: Endoscopy;  Laterality: N/A;  . IR IMAGING GUIDED PORT INSERTION  08/16/2019  . TONSILLECTOMY       OB History    Gravida  2   Para  2   Term      Preterm      AB      Living  2     SAB      TAB      Ectopic      Multiple      Live Births  2           Family  History  Problem Relation Age of Onset  . Diabetes Mellitus II Mother   . Hypertension Mother   . Congenital heart disease Mother   . Hypertension Father     Social History   Tobacco Use  . Smoking status: Former Research scientist (life sciences)  . Smokeless tobacco: Never Used  . Tobacco comment: quit in 2013  Substance Use Topics  . Alcohol use: Yes    Comment: occ  . Drug use: No    Home Medications Prior to Admission medications   Medication Sig Start Date End Date Taking? Authorizing Provider  acetaminophen (TYLENOL) 325 MG tablet Take 3 tablets (975 mg total) by mouth every 8 (eight) hours as needed for mild pain or moderate pain (or Fever >/= 101). Patient taking  differently: Take 650 mg by mouth every 8 (eight) hours as needed for mild pain or moderate pain (or Fever >/= 101).  08/23/19  Yes Mercy Riding, MD  amiodarone (PACERONE) 200 MG tablet Take 1 tablet (200 mg total) by mouth daily. Patient taking differently: Take 100 mg by mouth at bedtime.  10/18/19  Yes Ladell Pier, MD  diltiazem (CARTIA XT) 240 MG 24 hr capsule Take 1 capsule (240 mg total) by mouth daily. Please make overdue annual appt for future refills. [phone]. 2nd attempt. Patient taking differently: Take 240 mg by mouth at bedtime.  10/08/18  Yes Turner, Eber Hong, MD  ferrous gluconate (FERGON) 324 MG tablet Take 1 tablet (324 mg total) by mouth 3 (three) times daily with meals. 10/18/19  Yes Ladell Pier, MD  fluticasone (FLONASE) 50 MCG/ACT nasal spray Place 1 spray into both nostrils daily. Patient taking differently: Place 1 spray into both nostrils daily as needed.  08/23/19  Yes Mercy Riding, MD  furosemide (LASIX) 40 MG tablet Take 1 tablet (40 mg total) by mouth daily as needed for fluid or edema (sob). 08/23/19 11/21/19 Yes Mercy Riding, MD  loratadine (CLARITIN) 10 MG tablet Take 1 tablet (10 mg total) by mouth daily. Patient taking differently: Take 10 mg by mouth daily as needed for allergies.  08/23/19 11/21/19 Yes Mercy Riding, MD  metoprolol succinate (TOPROL-XL) 25 MG 24 hr tablet TAKE 1 TABLET BY MOUTH EVERY DAY. PLEASE MAKE AN APPOINTMENT WITH DOCTOR TURNER FOR FURHTER REFILLS. Patient taking differently: Take 25 mg by mouth at bedtime.  12/13/18  Yes Turner, Eber Hong, MD  rivaroxaban (XARELTO) 20 MG TABS tablet Take 1 tablet (20 mg total) by mouth daily with supper. 10/18/19  Yes Ladell Pier, MD  docusate sodium (COLACE) 100 MG capsule Take 1-2 capsules (100-200 mg total) by mouth 2 (two) times daily. Patient not taking: Reported on 09/27/2019 09/19/19   Ladell Pier, MD  lidocaine-prilocaine (EMLA) cream Apply 1 application topically as directed. 08/30/19    Ladell Pier, MD  magic mouthwash SOLN Take 5 mLs by mouth 4 (four) times daily as needed for mouth pain (swish and spit). 08/30/19   Ladell Pier, MD  ondansetron (ZOFRAN) 8 MG tablet Take 1 tablet (8 mg total) by mouth every 8 (eight) hours as needed for nausea or vomiting. 08/30/19   Ladell Pier, MD    Allergies    Compazine [prochlorperazine edisylate] and Zyrtec [cetirizine]  Review of Systems   Review of Systems  Respiratory: Negative for shortness of breath.   Cardiovascular: Positive for near-syncope. Negative for chest pain.  Neurological: Negative for headaches.  All other systems reviewed and are negative.  Physical Exam Updated Vital Signs BP 99/69   Pulse 78   Temp 97.9 F (36.6 C) (Oral)   Resp 20   Ht 5\' 6"  (1.676 m)   Wt 122.5 kg   LMP  (LMP Unknown)   SpO2 95%   BMI 43.58 kg/m   Physical Exam Vitals and nursing note reviewed.  Constitutional:      General: She is not in acute distress.    Appearance: Normal appearance. She is well-developed.     Comments: Pale skin  HENT:     Head: Normocephalic and atraumatic.  Eyes:     Conjunctiva/sclera: Conjunctivae normal.     Pupils: Pupils are equal, round, and reactive to light.  Cardiovascular:     Rate and Rhythm: Normal rate and regular rhythm.     Heart sounds: Normal heart sounds.  Pulmonary:     Effort: Pulmonary effort is normal. No respiratory distress.     Breath sounds: Normal breath sounds.  Abdominal:     General: There is no distension.     Palpations: Abdomen is soft.     Tenderness: There is no abdominal tenderness.  Musculoskeletal:        General: No deformity. Normal range of motion.     Cervical back: Normal range of motion and neck supple.  Skin:    General: Skin is warm and dry.  Neurological:     General: No focal deficit present.     Mental Status: She is alert and oriented to person, place, and time.     ED Results / Procedures / Treatments   Labs (all  labs ordered are listed, but only abnormal results are displayed) Labs Reviewed  COMPREHENSIVE METABOLIC PANEL - Abnormal; Notable for the following components:      Result Value   CO2 20 (*)    Glucose, Bld 196 (*)    Calcium 8.3 (*)    Total Protein 6.4 (*)    Albumin 1.7 (*)    Alkaline Phosphatase 213 (*)    Anion gap 17 (*)    All other components within normal limits  CBC WITH DIFFERENTIAL/PLATELET - Abnormal; Notable for the following components:   WBC 31.2 (*)    RBC 3.04 (*)    Hemoglobin 8.4 (*)    HCT 29.8 (*)    MCHC 28.2 (*)    RDW 20.6 (*)    Neutro Abs 26.2 (*)    Monocytes Absolute 1.9 (*)    Abs Immature Granulocytes 1.73 (*)    All other components within normal limits  PROTIME-INR - Abnormal; Notable for the following components:   Prothrombin Time 18.5 (*)    INR 1.6 (*)    All other components within normal limits  BRAIN NATRIURETIC PEPTIDE - Abnormal; Notable for the following components:   B Natriuretic Peptide 179.9 (*)    All other components within normal limits  RESPIRATORY PANEL BY RT PCR (FLU A&B, COVID)  LIPASE, BLOOD  TYPE AND SCREEN  TROPONIN I (HIGH SENSITIVITY)  TROPONIN I (HIGH SENSITIVITY)    EKG EKG Interpretation  Date/Time:  Thursday October 20 2019 09:28:41 EST Ventricular Rate:  94 PR Interval:    QRS Duration: 94 QT Interval:  385 QTC Calculation: 482 R Axis:   -47 Text Interpretation: Sinus rhythm Left anterior fascicular block Abnormal R-wave progression, late transition Confirmed by Dene Gentry [phone]) on 10/20/2019 9:40:01 AM   Radiology CT Angio Chest PE W and/or Wo Contrast  Result Date: 10/20/2019 CLINICAL DATA:  Dyspnea on exertion EXAM: CT ANGIOGRAPHY CHEST WITH CONTRAST TECHNIQUE: Multidetector CT imaging of the chest was performed using the standard protocol during bolus administration of intravenous contrast. Multiplanar CT image reconstructions and MIPs were obtained to evaluate the vascular anatomy.  CONTRAST:  23mL OMNIPAQUE IOHEXOL 350 MG/ML SOLN COMPARISON:  02/05/2018 and chest x-ray of the same date FINDINGS: Cardiovascular: Heart size is enlarged without pericardial effusion. Findings similar to the prior study. Aortic caliber 4 cm of the ascending thoracic aorta. No signs of pulmonary embolism. Mildly limited assessment of the lung bases due to respiratory motion. Mediastinum/Nodes: Right-sided central venous access device terminates in the right atrium. Pre pericardial lymph node measuring 1.5 cm new from previous exams. No additional signs of mediastinal adenopathy. Esophagus grossly normal. Mildly nodular thyroid similar to prior study. Lungs/Pleura: Small pulmonary nodules scattered throughout the right chest some within some without calcification. New pulmonary nodule right lung apex (image 20, series 6) 6 mm New right upper lobe pulmonary nodule (image 61, series 6) 6 mm New pulmonary nodule in the right lower lobe (image 84, series 6) 6 mm. Signs of basilar atelectasis on the left. No signs of pleural effusion Small left upper lobe pulmonary nodule, also appears new compared to the previous study. Upper Abdomen: Signs of hepatic metastatic disease not well evaluated. Suggestion of diffuse infiltration of the left hepatic lobe. Small amount perisplenic fluid has developed also since the study of 08/13/2019. Findings suggest interval worsening of disease in the liver though again this areas not well evaluated currently. Bulky celiac lymph node (image 88, series 4) 3.3 cm in short axis unchanged. Musculoskeletal: No destructive bone process. No acute bone finding. Review of the MIP images confirms the above findings. IMPRESSION: 1. Negative for pulmonary embolism. 2. Interval development of multiple bilateral pulmonary nodules, noncalcified findings are suspicious for metastatic disease. 3. Interval development of a 1.5 cm pre pericardial lymph node (image 134, series 5), suspicious for metastatic  disease. 4. Signs of hepatic metastatic disease not well evaluated currently, but appearing worse than on the prior study. 5. Small amount of perisplenic fluid has developed since the previous study. 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 Aortic Atherosclerosis (ICD10-I70.0). Electronically Signed   By: Zetta Bills M.D.   On: 10/20/2019 12:35   DG Chest Port 1 View  Result Date: 10/20/2019 CLINICAL DATA:  Dyspnea. EXAM: PORTABLE CHEST 1 VIEW COMPARISON:  08/13/2019 FINDINGS: The heart size and pulmonary vascularity are and the lungs clear. Aortic atherosclerosis. New power port in place. Tip is in superior vena cava just below the carina. No significant bone abnormality. IMPRESSION: 1. No active disease. 2.  Aortic Atherosclerosis (ICD10-I70.0). 3. Power port in good position. Electronically Signed   By: Lorriane Shire M.D.   On: 10/20/2019 10:26    Procedures Procedures (including critical care time)  Medications Ordered in ED Medications  sodium chloride (PF) 0.9 % injection (has no administration in time range)  sodium chloride 0.9 % bolus 500 mL (0 mLs Intravenous Stopped 10/20/19 1058)  iohexol (OMNIPAQUE) 350 MG/ML injection 100 mL (70 mLs Intravenous Contrast Given 10/20/19 1157)    ED Course  I have reviewed the triage vital signs and the nursing notes.  Pertinent labs & imaging results that were available during my care of the patient were reviewed by me and considered in my medical decision making (see chart for details).    MDM Rules/Calculators/A&P  MDM  Screen complete  Hubert Daubenspeck was evaluated in Emergency Department on 10/20/2019 for the symptoms described in the history of present illness. She was evaluated in the context of the global COVID-19 pandemic, which necessitated consideration that the patient might be at risk for infection with the SARS-CoV-2 virus that causes COVID-19.  Institutional protocols and algorithms that pertain to the evaluation of patients at risk for COVID-19 are in a state of rapid change based on information released by regulatory bodies including the CDC and federal and state organizations. These policies and algorithms were followed during the patient's care in the ED.  Patient presenting for evaluation of reported near syncope.  Patient's symptoms are mildly improved upon ED arrival.  Work-up demonstrated elevated white count.  Patient without other signs of acute infection.  CTA did not demonstrate PE or pneumonia.  Patient would likely benefit from further observation and work-up in the hospital.  Hospitalist service  [facility]) is aware of case and will evaluate for admission.   Final Clinical Impression(s) / ED Diagnoses Final diagnoses:  Near syncope    Rx / DC Orders ED Discharge Orders    None       Valarie Merino, MD 10/20/19 1314    Valarie Merino, MD 10/20/19 1435

## 2019-10-20 NOTE — ED Notes (Signed)
Pure wick has been placed. Suction set to 45mmHg.  

## 2019-10-20 NOTE — ED Triage Notes (Signed)
From home through EMS Dizziness, nausea x5 days  L ankle and L wrist pain (recurrent pain)  Pt missed 1 chemo treatment - supposed to get it today but gets too dizzy to stand Hx: CHF  88/60 95 HR 96% SpO2 190 CBG 97.7  AOx4  SOB on exertion per EMS

## 2019-10-20 NOTE — ED Notes (Signed)
Patient in CT at this time.

## 2019-10-20 NOTE — H&P (Addendum)
History and Physical    Amber Medina [id number] DOB: [birth year] DOA: 10/20/2019  PCP: [name], MD cardiologist, Dr. Tobias Alexander PCP.   Patient coming from: home  Chief Complaint: near syncopal event at home, debility.   HPI: Amber Medina is a 69 y.o. female with medical history significant of afib, chronic diastolic CHF, anemia, colon cancer with mets to liver. She was getting ready to go to her chemo appointment this Am when she felt dizzy getting out of bed. She attempted to sit up a few times and on the third time she saw the black spots and then got dry heaves. She never truly passed out. She is normally up on her feet and active, but she has been dealing with a toe pain for years that has limited her mobility. Gout has been ruled out by her oncologist. She has pain in her left hand/metacarpals as well. This has limited her mobility. She states she is concerned as she can not get up and get around. She is home alone. She denies any fever/chills, respiratory symptoms, dysuria. She was sick 2 weeks ago with what she thought was food poisoning.   She is on her 5th cycle of chemo which she cancelled today.   ED Course: seen for  Near syncopal event and symptoms mildly improved on arrival to ED. Work up in [place] showed leukocytosis. CTA was negative for PE, possible new mets to lungs.   Review of Systems  Constitutional: Positive for malaise/fatigue. Negative for chills, diaphoresis, fever and weight loss.  HENT: Negative for congestion and nosebleeds.   Respiratory: Negative for cough, sputum production, shortness of breath and wheezing.   Cardiovascular: Negative for chest pain and palpitations.  Gastrointestinal: Negative for abdominal pain, blood in stool, diarrhea, nausea and vomiting.  Genitourinary: Negative for dysuria, frequency and urgency.  Musculoskeletal: Positive for myalgias.  Skin: Negative for rash.       Rash on bottom from being in bed   Neurological: Positive for  weakness. Negative for dizziness, speech change, focal weakness and headaches.  Psychiatric/Behavioral: Negative for depression.    Past Medical History:  Diagnosis Date  . Abnormal pulmonary function test 02/2017  . Chronic diastolic (congestive) heart failure ([place]) 10/11/2015  . CKD (chronic kidney disease), stage II   . Colon cancer ([place])   . Hypertension   . Microcytic anemia    by labs 02/2017 -> adm 06/2017 with severe anemia, found to have colon CA.  . Morbid obesity ([place])   . Nocturnal hypoxemia   . Persistent atrial fibrillation ([place])   . Snoring   . Suspected sleep apnea     Past Surgical History:  Procedure Laterality Date  . COLONOSCOPY WITH PROPOFOL N/A 06/13/2017   Procedure: COLONOSCOPY WITH PROPOFOL;  Surgeon: Carol Ada, MD;  Location: [facility];  Service: Endoscopy;  Laterality: N/A;  . ESOPHAGOGASTRODUODENOSCOPY (EGD) WITH PROPOFOL N/A 06/13/2017   Procedure: ESOPHAGOGASTRODUODENOSCOPY (EGD) WITH PROPOFOL;  Surgeon: Carol Ada, MD;  Location: [facility];  Service: Endoscopy;  Laterality: N/A;  . IR IMAGING GUIDED PORT INSERTION  08/16/2019  . TONSILLECTOMY       reports that she has quit smoking. She has never used smokeless tobacco. She reports current alcohol use. She reports that she does not use drugs.  Allergies  Allergen Reactions  . Compazine [Prochlorperazine Edisylate] Swelling  . Zyrtec [Cetirizine] Swelling    Swelling of nose    Family History  Problem Relation Age of Onset  . Diabetes Mellitus II  Mother   . Hypertension Mother   . Congenital heart disease Mother   . Hypertension Father     Prior to Admission medications   Medication Sig Start Date End Date Taking? Authorizing Provider  acetaminophen (TYLENOL) 325 MG tablet Take 3 tablets (975 mg total) by mouth every 8 (eight) hours as needed for mild pain or moderate pain (or Fever >/= 101). Patient taking differently: Take 650 mg by mouth every 8 (eight) hours as needed for mild  pain or moderate pain (or Fever >/= 101).  08/23/19  Yes Mercy Riding, MD  amiodarone (PACERONE) 200 MG tablet Take 1 tablet (200 mg total) by mouth daily. Patient taking differently: Take 100 mg by mouth at bedtime.  10/18/19  Yes Ladell Pier, MD  diltiazem (CARTIA XT) 240 MG 24 hr capsule Take 1 capsule (240 mg total) by mouth daily. Please make overdue annual appt for future refills. [phone]. 2nd attempt. Patient taking differently: Take 240 mg by mouth at bedtime.  10/08/18  Yes Turner, Eber Hong, MD  ferrous gluconate (FERGON) 324 MG tablet Take 1 tablet (324 mg total) by mouth 3 (three) times daily with meals. 10/18/19  Yes Ladell Pier, MD  fluticasone (FLONASE) 50 MCG/ACT nasal spray Place 1 spray into both nostrils daily. Patient taking differently: Place 1 spray into both nostrils daily as needed.  08/23/19  Yes Mercy Riding, MD  furosemide (LASIX) 40 MG tablet Take 1 tablet (40 mg total) by mouth daily as needed for fluid or edema (sob). 08/23/19 11/21/19 Yes Mercy Riding, MD  loratadine (CLARITIN) 10 MG tablet Take 1 tablet (10 mg total) by mouth daily. Patient taking differently: Take 10 mg by mouth daily as needed for allergies.  08/23/19 11/21/19 Yes Mercy Riding, MD  metoprolol succinate (TOPROL-XL) 25 MG 24 hr tablet TAKE 1 TABLET BY MOUTH EVERY DAY. PLEASE MAKE AN APPOINTMENT WITH DOCTOR TURNER FOR FURHTER REFILLS. Patient taking differently: Take 25 mg by mouth at bedtime.  12/13/18  Yes Turner, Eber Hong, MD  rivaroxaban (XARELTO) 20 MG TABS tablet Take 1 tablet (20 mg total) by mouth daily with supper. 10/18/19  Yes Ladell Pier, MD  docusate sodium (COLACE) 100 MG capsule Take 1-2 capsules (100-200 mg total) by mouth 2 (two) times daily. Patient not taking: Reported on 09/27/2019 09/19/19   Ladell Pier, MD  lidocaine-prilocaine (EMLA) cream Apply 1 application topically as directed. 08/30/19   Ladell Pier, MD  magic mouthwash SOLN Take 5 mLs by mouth 4 (four)  times daily as needed for mouth pain (swish and spit). 08/30/19   Ladell Pier, MD  ondansetron (ZOFRAN) 8 MG tablet Take 1 tablet (8 mg total) by mouth every 8 (eight) hours as needed for nausea or vomiting. 08/30/19   Ladell Pier, MD    Physical Exam: Vitals:   10/20/19 1300 10/20/19 1315 10/20/19 1400 10/20/19 1540  BP: 113/67  (!) 115/53 (!) 103/50  Pulse: 73 73 72 72  Resp: 18 (!) 22  20  Temp:    (!) 97.4 F (36.3 C)  TempSrc:    Oral  SpO2: 95% 94% 94% 94%  Weight:      Height:        Constitutional: NAD, calm, comfortable Vitals:   10/20/19 1300 10/20/19 1315 10/20/19 1400 10/20/19 1540  BP: 113/67  (!) 115/53 (!) 103/50  Pulse: 73 73 72 72  Resp: 18 (!) 22  20  Temp:    Marland Kitchen)  97.4 F (36.3 C)  TempSrc:    Oral  SpO2: 95% 94% 94% 94%  Weight:      Height:       Eyes: PERRL, lids and conjunctivae normal ENMT: Mucous membranes are moist. Posterior pharynx clear of any exudate or lesions.Normal dentition.  Neck: normal, supple, no masses, no thyromegaly Respiratory: clear to auscultation bilaterally, no wheezing, no crackles. Normal respiratory effort. No accessory muscle use.  Cardiovascular: Regular rate and rhythm, +systolic murmur no rubs / gallops. No extremity edema. 2+ pedal pulses. No carotid bruits.  Abdomen: no tenderness.  POSITIVE hepatosplenomegaly and mass. Bowel sounds positive. Distended abdomen. No fluid wave.  Musculoskeletal: no clubbing / cyanosis. No joint deformity upper and lower extremities.  Skin: no rashes, lesions, ulcers. No induration. She does have stage 1 pressure ulcer on her right gluteus.  Neurologic: CN 2-12 grossly intact. Sensation intact,  Psychiatric: Normal judgment and insight. Alert and oriented x 3. Normal mood.   Labs on Admission: I have personally reviewed following labs and imaging studies  CBC: Recent Labs  Lab 10/20/19 1010  WBC 31.2*  NEUTROABS 26.2*  HGB 8.4*  HCT 29.8*  MCV 98.0  PLT [id number]   Basic  Metabolic Panel: Recent Labs  Lab 10/20/19 1010  NA 136  K 3.5  CL 99  CO2 20*  GLUCOSE 196*  BUN 12  CREATININE 0.95  CALCIUM 8.3*   GFR: Estimated Creatinine Clearance: 73.6 mL/min (by C-G formula based on SCr of 0.95 mg/dL). Liver Function Tests: Recent Labs  Lab 10/20/19 1010  AST 27  ALT 11  ALKPHOS 213*  BILITOT 1.1  PROT 6.4*  ALBUMIN 1.7*   Recent Labs  Lab 10/20/19 1010  LIPASE 16   No results for input(s): AMMONIA in the last 168 hours. Coagulation Profile: Recent Labs  Lab 10/20/19 1010  INR 1.6*   Cardiac Enzymes: No results for input(s): CKTOTAL, CKMB, CKMBINDEX, TROPONINI in the last 168 hours. BNP (last 3 results) No results for input(s): PROBNP in the last 8760 hours. HbA1C: No results for input(s): HGBA1C in the last 72 hours. CBG: No results for input(s): GLUCAP in the last 168 hours. Lipid Profile: No results for input(s): CHOL, HDL, LDLCALC, TRIG, CHOLHDL, LDLDIRECT in the last 72 hours. Thyroid Function Tests: No results for input(s): TSH, T4TOTAL, FREET4, T3FREE, THYROIDAB in the last 72 hours. Anemia Panel: No results for input(s): VITAMINB12, FOLATE, FERRITIN, TIBC, IRON, RETICCTPCT in the last 72 hours. Urine analysis:    Component Value Date/Time   COLORURINE AMBER (A) 08/13/2019 0222   APPEARANCEUR CLOUDY (A) 08/13/2019 0222   LABSPEC 1.012 08/13/2019 0222   PHURINE 5.0 08/13/2019 0222   GLUCOSEU 50 (A) 08/13/2019 0222   HGBUR SMALL (A) 08/13/2019 0222   BILIRUBINUR NEGATIVE 08/13/2019 0222   KETONESUR NEGATIVE 08/13/2019 0222   PROTEINUR 100 (A) 08/13/2019 0222   NITRITE NEGATIVE 08/13/2019 0222   LEUKOCYTESUR NEGATIVE 08/13/2019 0222    Radiological Exams on Admission: CT Angio Chest PE W and/or Wo Contrast  Result Date: 10/20/2019 CLINICAL DATA:  Dyspnea on exertion EXAM: CT ANGIOGRAPHY CHEST WITH CONTRAST TECHNIQUE: Multidetector CT imaging of the chest was performed using the standard protocol during bolus  administration of intravenous contrast. Multiplanar CT image reconstructions and MIPs were obtained to evaluate the vascular anatomy. CONTRAST:  70mL OMNIPAQUE IOHEXOL 350 MG/ML SOLN COMPARISON:  02/05/2018 and chest x-ray of the same date FINDINGS: Cardiovascular: Heart size is enlarged without pericardial effusion. Findings similar to the prior study. Aortic  caliber 4 cm of the ascending thoracic aorta. No signs of pulmonary embolism. Mildly limited assessment of the lung bases due to respiratory motion. Mediastinum/Nodes: Right-sided central venous access device terminates in the right atrium. Pre pericardial lymph node measuring 1.5 cm new from previous exams. No additional signs of mediastinal adenopathy. Esophagus grossly normal. Mildly nodular thyroid similar to prior study. Lungs/Pleura: Small pulmonary nodules scattered throughout the right chest some within some without calcification. New pulmonary nodule right lung apex (image 20, series 6) 6 mm New right upper lobe pulmonary nodule (image 61, series 6) 6 mm New pulmonary nodule in the right lower lobe (image 84, series 6) 6 mm. Signs of basilar atelectasis on the left. No signs of pleural effusion Small left upper lobe pulmonary nodule, also appears new compared to the previous study. Upper Abdomen: Signs of hepatic metastatic disease not well evaluated. Suggestion of diffuse infiltration of the left hepatic lobe. Small amount perisplenic fluid has developed also since the study of 08/13/2019. Findings suggest interval worsening of disease in the liver though again this areas not well evaluated currently. Bulky celiac lymph node (image 88, series 4) 3.3 cm in short axis unchanged. Musculoskeletal: No destructive bone process. No acute bone finding. Review of the MIP images confirms the above findings. IMPRESSION: 1. Negative for pulmonary embolism. 2. Interval development of multiple bilateral pulmonary nodules, noncalcified findings are suspicious for  metastatic disease. 3. Interval development of a 1.5 cm pre pericardial lymph node (image 134, series 5), suspicious for metastatic disease. 4. Signs of hepatic metastatic disease not well evaluated currently, but appearing worse than on the prior study. 5. Small amount of perisplenic fluid has developed since the previous study. 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 Aortic Atherosclerosis (ICD10-I70.0). Electronically Signed   By: Zetta Bills M.D.   On: 10/20/2019 12:35   DG Chest Port 1 View  Result Date: 10/20/2019 CLINICAL DATA:  Dyspnea. EXAM: PORTABLE CHEST 1 VIEW COMPARISON:  08/13/2019 FINDINGS: The heart size and pulmonary vascularity are and the lungs clear. Aortic atherosclerosis. New power port in place. Tip is in superior vena cava just below the carina. No significant bone abnormality. IMPRESSION: 1. No active disease. 2.  Aortic Atherosclerosis (ICD10-I70.0). 3. Power port in good position. Electronically Signed   By: Lorriane Shire M.D.   On: 10/20/2019 10:26    EKG: Independently reviewed. NSR with rate of 94  Assessment/Plan Principal Problem:   Near syncope Active Problems:   Chronic diastolic (congestive) heart failure ([facility])   Persistent atrial fibrillation ([facility])   Leukocytosis   Colon cancer metastasized to liver ([facility])  1) near syncope -sounds more orthostatic from history.  -check orthostatic vitals on floor and keep on tele -will check echo. Last one in 2019.  -is clinically euvolemic and with history of CHF will hold fluids at this time.    2) leukocytosis -new finding, no hx and no source of possible infectious cause  -not side effect of chemo -check smear, tsh, RA/uric acid with joint pain. Obesity could be contributing as well.  -repeat and follow cbc   3) debility -PT eval/treat -quite debilitated and lives alone. Possibly may need LTC placement.   4) chronic diastolic heart failure -no  acute exacerbation -continue home meds -daily weights and I/O  5) afib -sinus rhythm -continue rate controlling drugs -tele -continue xarelto  6) colon cancer mets to liver -currently on folfox. Was supposed to get 5th cycle today -CT  today shows possible new mets to lungs. I did discuss with patient.  -liver quite enlarged on exam, I believe she has stage 4. palliative care may need to be consulted.    7) elevated glucose multiple elevated glucoses with one at 331. Likely has diabetes.  -a1c pending  8) iron deficiency anemia -at baseline -continue home iron   9) stage 1 pressure ulcer -routine care ordered.   DVT prophylaxis: on xarelto Code Status: partial. Does not want to be intubated, but okay with meds/shock/compression/bagging.  Family Communication: patient at bedside  Disposition Plan: LTC/rehab vs. Home  Consults called: none Admission status: inpatient.   Orma Flaming MD [facility] Pager [phone]  If 7PM-7AM, please contact night-coverage [email or web address] Password [facility]  10/20/2019, 4:48 PM

## 2019-10-21 ENCOUNTER — Inpatient Hospital Stay (HOSPITAL_COMMUNITY): Payer: Medicare HMO

## 2019-10-21 DIAGNOSIS — R55 Syncope and collapse: Secondary | ICD-10-CM

## 2019-10-21 LAB — ECHOCARDIOGRAM COMPLETE
Height: 66 in
Weight: 4320 oz

## 2019-10-21 LAB — CBC WITH DIFFERENTIAL/PLATELET
Abs Immature Granulocytes: 1.04 10*3/uL — ABNORMAL HIGH (ref 0.00–0.07)
Basophils Absolute: 0.1 10*3/uL (ref 0.0–0.1)
Basophils Relative: 0 %
Eosinophils Absolute: 0 10*3/uL (ref 0.0–0.5)
Eosinophils Relative: 0 %
HCT: 26.3 % — ABNORMAL LOW (ref 36.0–46.0)
Hemoglobin: 7.5 g/dL — ABNORMAL LOW (ref 12.0–15.0)
Immature Granulocytes: 3 %
Lymphocytes Relative: 4 %
Lymphs Abs: 1.3 10*3/uL (ref 0.7–4.0)
MCH: 27.6 pg (ref 26.0–34.0)
MCHC: 28.5 g/dL — ABNORMAL LOW (ref 30.0–36.0)
MCV: 96.7 fL (ref 80.0–100.0)
Monocytes Absolute: 2.4 10*3/uL — ABNORMAL HIGH (ref 0.1–1.0)
Monocytes Relative: 7 %
Neutro Abs: 30.4 10*3/uL — ABNORMAL HIGH (ref 1.7–7.7)
Neutrophils Relative %: 86 %
Platelets: 249 10*3/uL (ref 150–400)
RBC: 2.72 MIL/uL — ABNORMAL LOW (ref 3.87–5.11)
RDW: 20.7 % — ABNORMAL HIGH (ref 11.5–15.5)
WBC: 35.2 10*3/uL — ABNORMAL HIGH (ref 4.0–10.5)
nRBC: 0.1 % (ref 0.0–0.2)

## 2019-10-21 LAB — COMPREHENSIVE METABOLIC PANEL
ALT: 12 U/L (ref 0–44)
AST: 49 U/L — ABNORMAL HIGH (ref 15–41)
Albumin: 1.5 g/dL — ABNORMAL LOW (ref 3.5–5.0)
Alkaline Phosphatase: 241 U/L — ABNORMAL HIGH (ref 38–126)
Anion gap: 10 (ref 5–15)
BUN: 14 mg/dL (ref 8–23)
CO2: 25 mmol/L (ref 22–32)
Calcium: 7.8 mg/dL — ABNORMAL LOW (ref 8.9–10.3)
Chloride: 102 mmol/L (ref 98–111)
Creatinine, Ser: 0.88 mg/dL (ref 0.44–1.00)
GFR calc Af Amer: >60 mL/min (ref >60)
GFR calc non Af Amer: >60 mL/min (ref >60)
Glucose, Bld: 119 mg/dL — ABNORMAL HIGH (ref 70–99)
Potassium: 3.7 mmol/L (ref 3.5–5.1)
Sodium: 137 mmol/L (ref 135–145)
Total Bilirubin: 0.6 mg/dL (ref 0.3–1.2)
Total Protein: 6.2 g/dL — ABNORMAL LOW (ref 6.5–8.1)

## 2019-10-21 LAB — MAGNESIUM: Magnesium: 1.6 mg/dL — ABNORMAL LOW (ref 1.7–2.4)

## 2019-10-21 LAB — HEMOGLOBIN A1C
Hgb A1c MFr Bld: 5.6 % (ref 4.8–5.6)
Mean Plasma Glucose: 114.02 mg/dL

## 2019-10-21 LAB — PHOSPHORUS: Phosphorus: 3.9 mg/dL (ref 2.5–4.6)

## 2019-10-21 LAB — PATHOLOGIST SMEAR REVIEW

## 2019-10-21 LAB — PREPARE RBC (CROSSMATCH)

## 2019-10-21 LAB — TSH: TSH: 3.942 u[IU]/mL (ref 0.350–4.500)

## 2019-10-21 LAB — URIC ACID: Uric Acid, Serum: 6.9 mg/dL (ref 2.5–7.1)

## 2019-10-21 MED ORDER — IOHEXOL 9 MG/ML PO SOLN
ORAL | Status: AC
  Administered 2019-10-21: 14:00:00 500 mL via ORAL
  Filled 2019-10-21: qty 1000

## 2019-10-21 MED ORDER — IOHEXOL 300 MG/ML  SOLN
100.0000 mL | Freq: Once | INTRAMUSCULAR | Status: AC | PRN
  Administered 2019-10-21: 16:00:00 100 mL via INTRAVENOUS

## 2019-10-21 MED ORDER — DIPHENHYDRAMINE HCL 25 MG PO CAPS
25.0000 mg | ORAL_CAPSULE | Freq: Once | ORAL | Status: AC
  Administered 2019-10-21: 17:00:00 25 mg via ORAL
  Filled 2019-10-21: qty 1

## 2019-10-21 MED ORDER — IOHEXOL 9 MG/ML PO SOLN
500.0000 mL | ORAL | Status: AC
  Administered 2019-10-21 (×2): 500 mL via ORAL

## 2019-10-21 MED ORDER — SODIUM CHLORIDE 0.9% FLUSH: 10.0000 mL | INTRAVENOUS | Status: DC | PRN

## 2019-10-21 MED ORDER — ACETAMINOPHEN 325 MG PO TABS
650.0000 mg | ORAL_TABLET | Freq: Once | ORAL | Status: AC
  Administered 2019-10-21: 17:00:00 650 mg via ORAL
  Filled 2019-10-21: qty 2

## 2019-10-21 MED ORDER — SODIUM CHLORIDE (PF) 0.9 % IJ SOLN
INTRAMUSCULAR | Status: AC
  Filled 2019-10-21: qty 50

## 2019-10-21 MED ORDER — SODIUM CHLORIDE 0.9% FLUSH
10.0000 mL | Freq: Two times a day (BID) | INTRAVENOUS | Status: DC
  Administered 2019-10-21 – 2019-10-28 (×6): 10 mL

## 2019-10-21 MED ORDER — CHLORHEXIDINE GLUCONATE CLOTH 2 % EX PADS
6.0000 | MEDICATED_PAD | Freq: Every day | CUTANEOUS | Status: DC
  Administered 2019-10-21 – 2019-10-28 (×8): 6 via TOPICAL

## 2019-10-21 MED ORDER — SODIUM CHLORIDE 0.9% IV SOLUTION: Freq: Once | INTRAVENOUS | Status: AC

## 2019-10-21 NOTE — Progress Notes (Signed)
  Echocardiogram 2D Echocardiogram has been performed.  Amber Medina 10/21/2019, 12:55 PM

## 2019-10-21 NOTE — Plan of Care (Signed)
  Problem: Nutrition: Goal: Adequate nutrition will be maintained Outcome: Progressing   Problem: Coping: Goal: Level of anxiety will decrease Outcome: Progressing   Problem: Elimination: Goal: Will not experience complications related to urinary retention Outcome: Progressing   Problem: Pain Managment: Goal: General experience of comfort will improve Outcome: Progressing   Problem: Safety: Goal: Ability to remain free from injury will improve Outcome: Progressing   Problem: Skin Integrity: Goal: Risk for impaired skin integrity will decrease Outcome: Progressing   Problem: Education: Goal: Knowledge of General Education information will improve Description: Including pain rating scale, medication(s)/side effects and non-pharmacologic comfort measures Outcome: Not Progressing   Problem: Health Behavior/Discharge Planning: Goal: Ability to manage health-related needs will improve Outcome: Not Progressing   Problem: Clinical Measurements: Goal: Ability to maintain clinical measurements within normal limits will improve Outcome: Not Progressing Goal: Will remain free from infection Outcome: Not Progressing Goal: Diagnostic test results will improve Outcome: Not Progressing Goal: Respiratory complications will improve Outcome: Not Progressing Goal: Cardiovascular complication will be avoided Outcome: Not Progressing   Problem: Activity: Goal: Risk for activity intolerance will decrease Outcome: Not Progressing   Problem: Elimination: Goal: Will not experience complications related to bowel motility Outcome: Not Progressing

## 2019-10-21 NOTE — Progress Notes (Signed)
PROGRESS NOTE    Amber Medina  [id number] DOB: [birth year] DOA: 10/20/2019 PCP: [name], MD    Brief Narrative:  69 year old female with history of chronic A. fib, chronic diastolic dysfunction, anemia, metastatic colon cancer currently on chemotherapy presented to the emergency room with episode of feeling dizzy when getting out of bed.  Patient also has progressive deconditioning and weakness.  She is on her fifth cycle of chemo. In the emergency room, vitals are stable.  CTA was negative for PE and that shows tumors.   Assessment & Plan:   Principal Problem:   Near syncope Active Problems:   Chronic diastolic (congestive) heart failure (HCC)   Persistent atrial fibrillation (HCC)   Colon cancer metastasized to liver (HCC)   Leukocytosis  Near syncope: Probably due to deconditioning.  Rule out cardiac arrhythmia.  Echocardiogram pending.  Monitor in telemetry to rule out any arrhythmias.  Paroxysmal A. fib: Currently sinus rhythm.  Rule out arrhythmias.  On beta-blockers, Cardizem and amiodarone and rate control.  Therapeutic on Xarelto.  Leukocytosis: Her WBC count keeps increasing.  35,000 today.  No evidence of localized infection.  Suspect leukemoid reaction due to metastatic cancer.  Blood cultures drawn, no antibiotics indicated.  Will recheck in the morning.  Metastatic colon cancer: Clinical metastasis to liver, CT chest with evidence of metastasis.  Currently on chemo.  Discussed with oncology and they are formulating further plan for her.  Deconditioning: Work with PT OT.   DVT prophylaxis: Xarelto Code Status: Partial Family Communication: None Disposition Plan: Pending clinical improvement.  Anticipate need for more supervision or SNF.   Consultants:   Oncology  Procedures:   None  Antimicrobials:   None   Subjective: Patient seen and examined.  No overnight events.  Feels overall weak, otherwise no local symptoms. She has not ambulated yet,  denies any syncopal episode.  She thinks it was just weakness and nearly passing out spells.  Objective: Vitals:   10/20/19 1848 10/20/19 1959 10/21/19 0507 10/21/19 1338  BP: (!) 108/51 (!) 116/52 (!) 131/55 126/80  Pulse: 79 75 (!) 108 88  Resp: (!) 22 17 17 17   Temp: 98.7 F (37.1 C) 98.1 F (36.7 C) 98.3 F (36.8 C) 97.9 F (36.6 C)  TempSrc: Oral Oral Oral Oral  SpO2: 95% 96% 91% 98%  Weight:      Height:        Intake/Output Summary (Last 24 hours) at 10/21/2019 1541 Last data filed at 10/21/2019 1500 Gross per 24 hour  Intake --  Output 500 ml  Net -500 ml   Filed Weights   10/20/19 0943  Weight: 122.5 kg    Examination:  General exam: Appears calm and comfortable, morbidly obese.  Not in any distress.  On room air. Respiratory system: Clear to auscultation. Respiratory effort normal.  No added sound.  Right chest port nontender. Cardiovascular system: S1 & S2 heard, RRR. No JVD, murmurs, rubs, gallops or clicks.  Trace bilateral edema.  She does have some swelling and asymmetrical edema of the left foot more than the right foot.   Gastrointestinal system: Abdomen is nondistended, soft and nontender.  Palpable nodular liver up to mid abdomen. Normal bowel sounds heard.  Obese and pendulous. Central nervous system: Alert and oriented. No focal neurological deficits. Extremities: Symmetric 5 x 5 power. Skin: No rashes, lesions or ulcers Psychiatry: Judgement and insight appear normal. Mood & affect appropriate.     Data Reviewed: I have personally reviewed following labs  and imaging studies  CBC: Recent Labs  Lab 10/20/19 1010 10/21/19 0840  WBC 31.2* 35.2*  NEUTROABS 26.2* 30.4*  HGB 8.4* 7.5*  HCT 29.8* 26.3*  MCV 98.0 96.7  PLT 237 0000000   Basic Metabolic Panel: Recent Labs  Lab 10/20/19 1010 10/21/19 0504 10/21/19 0840  NA 136 137  --   K 3.5 3.7  --   CL 99 102  --   CO2 20* 25  --   GLUCOSE 196* 119*  --   BUN 12 14  --   CREATININE 0.95  0.88  --   CALCIUM 8.3* 7.8*  --   MG  --   --  1.6*  PHOS  --   --  3.9   GFR: Estimated Creatinine Clearance: 79.4 mL/min (by C-G formula based on SCr of 0.88 mg/dL). Liver Function Tests: Recent Labs  Lab 10/20/19 1010 10/21/19 0504  AST 27 49*  ALT 11 12  ALKPHOS 213* 241*  BILITOT 1.1 0.6  PROT 6.4* 6.2*  ALBUMIN 1.7* 1.5*   Recent Labs  Lab 10/20/19 1010  LIPASE 16   No results for input(s): AMMONIA in the last 168 hours. Coagulation Profile: Recent Labs  Lab 10/20/19 1010  INR 1.6*   Cardiac Enzymes: No results for input(s): CKTOTAL, CKMB, CKMBINDEX, TROPONINI in the last 168 hours. BNP (last 3 results) No results for input(s): PROBNP in the last 8760 hours. HbA1C: Recent Labs    10/21/19 0504  HGBA1C 5.6   CBG: No results for input(s): GLUCAP in the last 168 hours. Lipid Profile: No results for input(s): CHOL, HDL, LDLCALC, TRIG, CHOLHDL, LDLDIRECT in the last 72 hours. Thyroid Function Tests: Recent Labs    10/21/19 0504  TSH 3.942   Anemia Panel: No results for input(s): VITAMINB12, FOLATE, FERRITIN, TIBC, IRON, RETICCTPCT in the last 72 hours. Sepsis Labs: No results for input(s): PROCALCITON, LATICACIDVEN in the last 168 hours.  Recent Results (from the past 240 hour(s))  Respiratory Panel by RT PCR (Flu A&B, Covid) - Nasopharyngeal Swab     Status: None   Collection Time: 10/20/19 10:10 AM   Specimen: Nasopharyngeal Swab  Result Value Ref Range Status   SARS Coronavirus 2 by RT PCR NEGATIVE NEGATIVE Final    Comment: (NOTE) SARS-CoV-2 target nucleic acids are NOT DETECTED. The SARS-CoV-2 RNA is generally detectable in upper respiratoy specimens during the acute phase of infection. The lowest concentration of SARS-CoV-2 viral copies this assay can detect is 131 copies/mL. A negative result does not preclude SARS-Cov-2 infection and should not be used as the sole basis for treatment or other patient management decisions. A negative  result may occur with  improper specimen collection/handling, submission of specimen other than nasopharyngeal swab, presence of viral mutation(s) within the areas targeted by this assay, and inadequate number of viral copies (<131 copies/mL). A negative result must be combined with clinical observations, patient history, and epidemiological information. The expected result is Negative. Fact Sheet for Patients:  PinkCheek.be Fact Sheet for Healthcare Providers:  GravelBags.it This test is not yet ap proved or cleared by the [place] FDA and  has been authorized for detection and/or diagnosis of SARS-CoV-2 by FDA under an Emergency Use Authorization (EUA). This EUA will remain  in effect (meaning this test can be used) for the duration of the COVID-19 declaration under Section 564(b)(1) of the Act, 21 U.S.C. section 360bbb-3(b)(1), unless the authorization is terminated or revoked sooner.    Influenza A by PCR NEGATIVE NEGATIVE  Final   Influenza B by PCR NEGATIVE NEGATIVE Final    Comment: (NOTE) The Xpert Xpress SARS-CoV-2/FLU/RSV assay is intended as an aid in  the diagnosis of influenza from Nasopharyngeal swab specimens and  should not be used as a sole basis for treatment. Nasal washings and  aspirates are unacceptable for Xpert Xpress SARS-CoV-2/FLU/RSV  testing. Fact Sheet for Patients: PinkCheek.be Fact Sheet for Healthcare Providers: GravelBags.it This test is not yet approved or cleared by the [place] FDA and  has been authorized for detection and/or diagnosis of SARS-CoV-2 by  FDA under an Emergency Use Authorization (EUA). This EUA will remain  in effect (meaning this test can be used) for the duration of the  Covid-19 declaration under Section 564(b)(1) of the Act, 21  U.S.C. section 360bbb-3(b)(1), unless the authorization is  terminated or  revoked. Performed at [facility], [place] [address] [place], [place] [address]          Radiology Studies: CT Angio Chest PE W and/or Wo Contrast  Result Date: 10/20/2019 CLINICAL DATA:  Dyspnea on exertion EXAM: CT ANGIOGRAPHY CHEST WITH CONTRAST TECHNIQUE: Multidetector CT imaging of the chest was performed using the standard protocol during bolus administration of intravenous contrast. Multiplanar CT image reconstructions and MIPs were obtained to evaluate the vascular anatomy. CONTRAST:  66mL OMNIPAQUE IOHEXOL 350 MG/ML SOLN COMPARISON:  02/05/2018 and chest x-ray of the same date FINDINGS: Cardiovascular: Heart size is enlarged without pericardial effusion. Findings similar to the prior study. Aortic caliber 4 cm of the ascending thoracic aorta. No signs of pulmonary embolism. Mildly limited assessment of the lung bases due to respiratory motion. Mediastinum/Nodes: Right-sided central venous access device terminates in the right atrium. Pre pericardial lymph node measuring 1.5 cm new from previous exams. No additional signs of mediastinal adenopathy. Esophagus grossly normal. Mildly nodular thyroid similar to prior study. Lungs/Pleura: Small pulmonary nodules scattered throughout the right chest some within some without calcification. New pulmonary nodule right lung apex (image 20, series 6) 6 mm New right upper lobe pulmonary nodule (image 61, series 6) 6 mm New pulmonary nodule in the right lower lobe (image 84, series 6) 6 mm. Signs of basilar atelectasis on the left. No signs of pleural effusion Small left upper lobe pulmonary nodule, also appears new compared to the previous study. Upper Abdomen: Signs of hepatic metastatic disease not well evaluated. Suggestion of diffuse infiltration of the left hepatic lobe. Small amount perisplenic fluid has developed also since the study of 08/13/2019. Findings suggest interval worsening of disease in the liver though again this areas  not well evaluated currently. Bulky celiac lymph node (image 88, series 4) 3.3 cm in short axis unchanged. Musculoskeletal: No destructive bone process. No acute bone finding. Review of the MIP images confirms the above findings. IMPRESSION: 1. Negative for pulmonary embolism. 2. Interval development of multiple bilateral pulmonary nodules, noncalcified findings are suspicious for metastatic disease. 3. Interval development of a 1.5 cm pre pericardial lymph node (image 134, series 5), suspicious for metastatic disease. 4. Signs of hepatic metastatic disease not well evaluated currently, but appearing worse than on the prior study. 5. Small amount of perisplenic fluid has developed since the previous study. 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 Aortic Atherosclerosis (ICD10-I70.0). Electronically Signed   By: Zetta Bills M.D.   On: 10/20/2019 12:35   DG Chest Port 1 View  Result Date: 10/20/2019 CLINICAL DATA:  Dyspnea. EXAM: PORTABLE CHEST 1  VIEW COMPARISON:  08/13/2019 FINDINGS: The heart size and pulmonary vascularity are and the lungs clear. Aortic atherosclerosis. New power port in place. Tip is in superior vena cava just below the carina. No significant bone abnormality. IMPRESSION: 1. No active disease. 2.  Aortic Atherosclerosis (ICD10-I70.0). 3. Power port in good position. Electronically Signed   By: Lorriane Shire M.D.   On: 10/20/2019 10:26        Scheduled Meds: . sodium chloride   Intravenous Once  . acetaminophen  650 mg Oral Once  . amiodarone  100 mg Oral QHS  . Chlorhexidine Gluconate Cloth  6 each Topical Daily  . diltiazem  240 mg Oral QHS  . diphenhydrAMINE  25 mg Oral Once  . ferrous gluconate  324 mg Oral TID WC  . metoprolol succinate  25 mg Oral QHS  . rivaroxaban  20 mg Oral Q supper  . sodium chloride (PF)      . sodium chloride flush  3 mL Intravenous Q12H   Continuous Infusions: . sodium chloride        LOS: 1 day    Time spent: 30 minutes    Barb Merino, MD [facility] Pager [phone]

## 2019-10-21 NOTE — Progress Notes (Addendum)
HEMATOLOGY-ONCOLOGY PROGRESS NOTE  SUBJECTIVE: Presented to the emergency room following a near syncopal event at home.  She reported dizziness when attempting get out of bed.  She also saw some black spots and got some dry heaves.  States that she nearly passed out.  Today, she reports ongoing dizziness.  She reports some shortness of breath at home when she was sitting on the side the bed.  He is not having any chest pain.  Has not seen any bleeding.  Denies fevers and chills.  Oncology History  Colon cancer metastasized to liver [facility])  08/17/2019 Initial Diagnosis   Colon cancer metastasized to liver ([place])   08/18/2019 -  Chemotherapy   The patient had palonosetron (ALOXI) injection 0.25 mg, 0.25 mg, Intravenous,  Once, 4 of 12 cycles Administration: 0.25 mg (08/18/2019), 0.25 mg (08/30/2019), 0.25 mg (09/13/2019), 0.25 mg (09/27/2019) fluorouracil (ADRUCIL) 5,200 mg in sodium chloride 0.9 % 1,000 mL chemo infusion, 2,000 mg/m2 = 5,200 mg (83.3 % of original dose 2,400 mg/m2), Intravenous, Once, 1 of 1 cycle Dose modification: 2,000 mg/m2 (original dose 2,400 mg/m2, Cycle 1, Reason: Provider Judgment) Administration: 5,200 mg (08/18/2019) leucovorin 778 mg in dextrose 5 % 250 mL infusion, 300 mg/m2 = 778 mg (100 % of original dose 300 mg/m2), Intravenous,  Once, 4 of 12 cycles Dose modification: 300 mg/m2 (original dose 300 mg/m2, Cycle 1, Reason: Provider Judgment) Administration: 778 mg (08/18/2019), 778 mg (08/30/2019), 778 mg (09/13/2019), 778 mg (09/27/2019) oxaliplatin (ELOXATIN) 170 mg in dextrose 5 % 500 mL chemo infusion, 65 mg/m2 = 170 mg (100 % of original dose 65 mg/m2), Intravenous,  Once, 4 of 12 cycles Dose modification: 65 mg/m2 (original dose 65 mg/m2, Cycle 1, Reason: Provider Judgment) Administration: 170 mg (08/18/2019), 170 mg (08/30/2019), 170 mg (09/13/2019), 170 mg (09/27/2019) fluorouracil (ADRUCIL) 4,150 mg in sodium chloride 0.9 % 67 mL chemo infusion, 1,600 mg/m2 =  4,150 mg (80 % of original dose 2,000 mg/m2), Intravenous, 1 Day/Dose, 3 of 11 cycles Dose modification: 2,000 mg/m2 (original dose 2,000 mg/m2, Cycle 2, Reason: Provider Judgment), 1,600 mg/m2 (original dose 2,000 mg/m2, Cycle 2, Reason: Provider Judgment) Administration: 4,150 mg (08/30/2019), 4,150 mg (09/13/2019), 4,150 mg (09/27/2019)  for chemotherapy treatment.     PHYSICAL EXAMINATION:  Vitals:   10/20/19 1959 10/21/19 0507  BP: (!) 116/52 (!) 131/55  Pulse: 75 (!) 108  Resp: 17 17  Temp: 98.1 F (36.7 C) 98.3 F (36.8 C)  SpO2: 96% 91%   Filed Weights   10/20/19 0943  Weight: 270 lb (122.5 kg)    Intake/Output from previous day: No intake/output data recorded.  GENERAL:alert, no distress and comfortable OROPHARYNX: No thrush or mucositis LUNGS: clear to auscultation and percussion with normal breathing effort HEART: regular rate & rhythm, 2/6 systolic murmur ABDOMEN:abdomen soft, non-tender and normal bowel sounds Musculoskeletal:no cyanosis of digits and no clubbing  NEURO: alert & oriented x 3 with fluent speech, no focal motor/sensory deficits  Port-A-Cath without erythema or edema  LABORATORY DATA:  I have reviewed the data as listed CMP Latest Ref Rng & Units 10/21/2019 10/20/2019 09/27/2019  Glucose 70 - 99 mg/dL 119(H) 196(H) 183(H)  BUN 8 - 23 mg/dL 14 12 15   Creatinine 0.44 - 1.00 mg/dL 0.88 0.95 0.96  Sodium 135 - 145 mmol/L 137 136 141  Potassium 3.5 - 5.1 mmol/L 3.7 3.5 3.6  Chloride 98 - 111 mmol/L 102 99 107  CO2 22 - 32 mmol/L 25 20(L) 22  Calcium 8.9 - 10.3 mg/dL  7.8(L) 8.3(L) 8.4(L)  Total Protein 6.5 - 8.1 g/dL 6.2(L) 6.4(L) 6.9  Total Bilirubin 0.3 - 1.2 mg/dL 0.6 1.1 0.6  Alkaline Phos 38 - 126 U/L 241(H) 213(H) 331(H)  AST 15 - 41 U/L 49(H) 27 26  ALT 0 - 44 U/L 12 11 18     Lab Results  Component Value Date   WBC 35.2 (H) 10/21/2019   HGB 7.5 (L) 10/21/2019   HCT 26.3 (L) 10/21/2019   MCV 96.7 10/21/2019   PLT 249 10/21/2019    NEUTROABS 30.4 (H) 10/21/2019    CT Angio Chest PE W and/or Wo Contrast  Result Date: 10/20/2019 CLINICAL DATA:  Dyspnea on exertion EXAM: CT ANGIOGRAPHY CHEST WITH CONTRAST TECHNIQUE: Multidetector CT imaging of the chest was performed using the standard protocol during bolus administration of intravenous contrast. Multiplanar CT image reconstructions and MIPs were obtained to evaluate the vascular anatomy. CONTRAST:  28m OMNIPAQUE IOHEXOL 350 MG/ML SOLN COMPARISON:  02/05/2018 and chest x-ray of the same date FINDINGS: Cardiovascular: Heart size is enlarged without pericardial effusion. Findings similar to the prior study. Aortic caliber 4 cm of the ascending thoracic aorta. No signs of pulmonary embolism. Mildly limited assessment of the lung bases due to respiratory motion. Mediastinum/Nodes: Right-sided central venous access device terminates in the right atrium. Pre pericardial lymph node measuring 1.5 cm new from previous exams. No additional signs of mediastinal adenopathy. Esophagus grossly normal. Mildly nodular thyroid similar to prior study. Lungs/Pleura: Small pulmonary nodules scattered throughout the right chest some within some without calcification. New pulmonary nodule right lung apex (image 20, series 6) 6 mm New right upper lobe pulmonary nodule (image 61, series 6) 6 mm New pulmonary nodule in the right lower lobe (image 84, series 6) 6 mm. Signs of basilar atelectasis on the left. No signs of pleural effusion Small left upper lobe pulmonary nodule, also appears new compared to the previous study. Upper Abdomen: Signs of hepatic metastatic disease not well evaluated. Suggestion of diffuse infiltration of the left hepatic lobe. Small amount perisplenic fluid has developed also since the study of 08/13/2019. Findings suggest interval worsening of disease in the liver though again this areas not well evaluated currently. Bulky celiac lymph node (image 88, series 4) 3.3 cm in short axis  unchanged. Musculoskeletal: No destructive bone process. No acute bone finding. Review of the MIP images confirms the above findings. IMPRESSION: 1. Negative for pulmonary embolism. 2. Interval development of multiple bilateral pulmonary nodules, noncalcified findings are suspicious for metastatic disease. 3. Interval development of a 1.5 cm pre pericardial lymph node (image 134, series 5), suspicious for metastatic disease. 4. Signs of hepatic metastatic disease not well evaluated currently, but appearing worse than on the prior study. 5. Small amount of perisplenic fluid has developed since the previous study. 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 Aortic Atherosclerosis (ICD10-I70.0). Electronically Signed   By: GZetta BillsM.D.   On: 10/20/2019 12:35   DG Chest Port 1 View  Result Date: 10/20/2019 CLINICAL DATA:  Dyspnea. EXAM: PORTABLE CHEST 1 VIEW COMPARISON:  08/13/2019 FINDINGS: The heart size and pulmonary vascularity are and the lungs clear. Aortic atherosclerosis. New power port in place. Tip is in superior vena cava just below the carina. No significant bone abnormality. IMPRESSION: 1. No active disease. 2.  Aortic Atherosclerosis (ICD10-I70.0). 3. Power port in good position. Electronically Signed   By: JLorriane ShireM.D.   On: 10/20/2019 10:26    ASSESSMENT  AND PLAN: 1. 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), right colectomy 04/22/2018  Ascending colon, moderately differentiated mucinous adenocarcinoma, no lymphovascular perineural invasion, no tumor deposits, tumor budding-low, 0/14 lymph nodes, tumor invades into the subserosa and is focally present at the inked serosal surface (pT4), no loss of mismatch repair protein expression  Colonoscopy 06/13/2017-ascending colon mass-adenocarcinoma, polyps in the ascending and sigmoid-tubular adenomas  CT abdomen/pelvis 08/13/2019-multiple peritoneal masses compatible with metastatic disease,  multiple hypodense liver lesions consistent with metastatic disease  CEA 08/13/2019 -806.0  CT biopsy of a peritoneal mass 08/15/2019, metastatic adenocarcinoma consistent with colon cancer  MSS, tumor mutation burden 5, KRAS G12V ? Cycle 1 FOLFOX 08/18/2019  ? Cycle 2 FOLFOX 08/30/2019-5-FU dose reduced secondary to mucositis ? Cycle 3 FOLFOX 09/13/2019  2.Acute pulmonary embolism and left popliteal DVT 02/05/2018 3.Iron deficiency anemia September 2018 4.Atrial fibrillation 5.Congestive heart failure 6.Hypertension 7.Morbid obesity 8.Renal failure 9.  Hospital admission 10/20/2019-near syncope, anemia, leukocytosis  Ms. Hertzberg is now admitted following a near syncopal episode.  Lab work indicates anemia and leukocytosis.  She is symptomatic with her anemia with dizziness and dyspnea.  She is afebrile.  Leukocytosis may be leukemoid reaction due to her underlying cancer. CT angiogram the chest indicated interval development of bilateral pulmonary nodules suspicious for metastatic disease.  The patient did not have a baseline CT of the chest prior to beginning therapy when she was initially diagnosed.  This is a comparison to a prior CT performed on 02/05/2018.  Recommendations: 1.  We will obtain a CT of the abdomen pelvis with contrast for restaging purposes. 2.  Transfuse 1 unit PRBCs today for symptomatic anemia. 3.  Follow CBC and follow-up on blood cultures.   LOS: 1 day   Mikey Bussing, DNP, AGPCNP-BC, AOCNP 10/21/19 Ms. Fellenz was interviewed and examined.  She canceled her last scheduled cycle of FOLFOX secondary to nausea and vomiting.  She says this resolved.  No fever.  She had presyncope yesterday and presented to the emergency room.  I suspect her presentation is related to dehydration and symptomatic anemia.  This occurs in the setting of severe malnutrition and metastatic colon cancer.  There is clinical evidence of disease progression.  The CEA has not  improved with chemotherapy and she has marked hepatomegaly on exam today. I have a low suspicion for a systemic infection.  Leukocytosis may be related to a leukemoid reaction from cancer or bone marrow recovery from chemotherapy.  The white count was elevated prior to beginning treatment in November. I discussed the likelihood of disease progression with Ms. Scharrer.  The plan is to provide supportive care while she is here and then change to a different systemic therapy regimen.  I will check on her 10/24/2019 if she remains in the hospital.  Outpatient follow-up will be scheduled at the cancer center for 10/27/2019.  I discussed the plan to switch to FOLFIRI chemotherapy if the abdomen CT confirms disease progression.  We reviewed potential toxicities associated with irinotecan including the chance of diarrhea and alopecia.  She agrees to proceed if this is the treatment recommendation.  This will be scheduled as an outpatient.   Please call oncology as needed over the weekend.

## 2019-10-21 NOTE — Progress Notes (Signed)
PT Cancellation Note  Patient Details Name: Amber Medina MRN: [id number] DOB: [birth year]   Cancelled Treatment:    Reason Eval/Treat Not Completed: Patient at procedure or test/unavailable  Pt finishing contrast, going for CT, then getting transferred to another room and getting PRBC.  Will f/u as able.  Maggie Font, PT Acute Rehab Services Pager [phone] [facility] [phone]    Karlton Lemon 10/21/2019, 2:05 PM

## 2019-10-22 ENCOUNTER — Inpatient Hospital Stay: Payer: Medicare HMO

## 2019-10-22 DIAGNOSIS — C787 Secondary malignant neoplasm of liver and intrahepatic bile duct: Secondary | ICD-10-CM

## 2019-10-22 DIAGNOSIS — Z515 Encounter for palliative care: Secondary | ICD-10-CM

## 2019-10-22 DIAGNOSIS — C189 Malignant neoplasm of colon, unspecified: Secondary | ICD-10-CM

## 2019-10-22 DIAGNOSIS — Z7189 Other specified counseling: Secondary | ICD-10-CM

## 2019-10-22 LAB — CBC WITH DIFFERENTIAL/PLATELET
Abs Immature Granulocytes: 1.11 10*3/uL — ABNORMAL HIGH (ref 0.00–0.07)
Basophils Absolute: 0.1 10*3/uL (ref 0.0–0.1)
Basophils Relative: 0 %
Eosinophils Absolute: 0 10*3/uL (ref 0.0–0.5)
Eosinophils Relative: 0 %
HCT: 29 % — ABNORMAL LOW (ref 36.0–46.0)
Hemoglobin: 8.2 g/dL — ABNORMAL LOW (ref 12.0–15.0)
Immature Granulocytes: 3 %
Lymphocytes Relative: 3 %
Lymphs Abs: 1 10*3/uL (ref 0.7–4.0)
MCH: 27.1 pg (ref 26.0–34.0)
MCHC: 28.3 g/dL — ABNORMAL LOW (ref 30.0–36.0)
MCV: 95.7 fL (ref 80.0–100.0)
Monocytes Absolute: 2.6 10*3/uL — ABNORMAL HIGH (ref 0.1–1.0)
Monocytes Relative: 7 %
Neutro Abs: 31.3 10*3/uL — ABNORMAL HIGH (ref 1.7–7.7)
Neutrophils Relative %: 87 %
Platelets: 210 10*3/uL (ref 150–400)
RBC: 3.03 MIL/uL — ABNORMAL LOW (ref 3.87–5.11)
RDW: 20.4 % — ABNORMAL HIGH (ref 11.5–15.5)
WBC: 36 10*3/uL — ABNORMAL HIGH (ref 4.0–10.5)
nRBC: 0 % (ref 0.0–0.2)

## 2019-10-22 LAB — MRSA PCR SCREENING: MRSA by PCR: NEGATIVE

## 2019-10-22 LAB — LACTIC ACID, PLASMA: Lactic Acid, Venous: 2.7 mmol/L (ref 0.5–1.9)

## 2019-10-22 LAB — RHEUMATOID FACTOR: Rheumatoid fact SerPl-aCnc: <10 IU/mL (ref 0.0–13.9)

## 2019-10-22 LAB — MAGNESIUM: Magnesium: 2.2 mg/dL (ref 1.7–2.4)

## 2019-10-22 LAB — PROCALCITONIN: Procalcitonin: 3.14 ng/mL

## 2019-10-22 MED ORDER — MAGNESIUM SULFATE 2 GM/50ML IV SOLN
2.0000 g | Freq: Once | INTRAVENOUS | Status: AC
  Administered 2019-10-22: 2 g via INTRAVENOUS
  Filled 2019-10-22: qty 50

## 2019-10-22 MED ORDER — SODIUM CHLORIDE 0.9 % IV SOLN: INTRAVENOUS | Status: DC

## 2019-10-22 MED ORDER — VANCOMYCIN HCL 2000 MG/400ML IV SOLN
2000.0000 mg | Freq: Once | INTRAVENOUS | Status: AC
  Administered 2019-10-22: 19:00:00 2000 mg via INTRAVENOUS
  Filled 2019-10-22: qty 400

## 2019-10-22 MED ORDER — ONDANSETRON HCL 4 MG/2ML IJ SOLN
4.0000 mg | Freq: Four times a day (QID) | INTRAMUSCULAR | Status: DC | PRN
  Administered 2019-10-22 – 2019-10-23 (×3): 4 mg via INTRAVENOUS
  Filled 2019-10-22 (×3): qty 2

## 2019-10-22 MED ORDER — VANCOMYCIN HCL 1750 MG/350ML IV SOLN
1750.0000 mg | INTRAVENOUS | Status: DC
  Administered 2019-10-23 – 2019-10-24 (×2): 1750 mg via INTRAVENOUS
  Filled 2019-10-22 (×2): qty 350

## 2019-10-22 MED ORDER — PIPERACILLIN-TAZOBACTAM 3.375 G IVPB
3.3750 g | Freq: Three times a day (TID) | INTRAVENOUS | Status: DC
  Administered 2019-10-22 – 2019-10-25 (×8): 3.375 g via INTRAVENOUS
  Filled 2019-10-22 (×8): qty 50

## 2019-10-22 NOTE — Consult Note (Signed)
Palliative care progress note  Reason for consult: Goals of care in light of metastatic colon cancer  Chart reviewed including personal review of pertinent labs and imaging.  Discussed with bedside staff.  Briefly, Amber Medina is a 69 year old female with past medical history of chronic A. fib, diastolic dysfunction, anemia, metastatic colon cancer on chemotherapy, arthritis who presented to the emergency room with dizziness and weakness.  Work-up this admission reveals what appears to be disease progression with metastasis to liver and lungs.  [name] has already evaluated patient and plan for modification of her chemotherapy regimen to FOLFIRI if she shows further progression in abdominal disease.  Plan is for her to follow-up with him again as an outpatient next week.  Palliative consulted for goals of care.  I met today with Amber Medina.  I introduced palliative care as specialized medical care for people living with serious illness. It focuses on providing relief from the symptoms and stress of a serious illness. The goal is to improve quality of life for both the patient and the family.  She inquired about the differences between hospice and palliative care and we reviewed those today.  Amber Medina reports that the most important thing to her is her family.  She has 2 children.  She has a daughter and 3 grandchildren in [place] and a son in [place].  Her daughter works as a Marine scientist with a [employer].  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  Her biggest hobbies include science fiction entertainment and participation in [place] for the [employer] convention in [place].  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  She very much hopes to be able to participate in plan again this year "to get to an even 20."  We discussed her understanding of her disease and she reports understanding that she has incurable illness and the goal of further interventions is to  stop her slow progression while maintaining as much functional status as possible.  This led to initial discussion that if there comes a point where further disease modifying therapy is not likely to be beneficial, this does not mean that there is not care left to give, however it means that care should be focused on aggressive symptom management to preserve her functional status as much possible.  We discussed advance care planning and specifically about CODE STATUS, surrogate decision making, and limits of care.  She reports that her children be her surrogate decision makers and we talked about working to outline wishes for her care moving forward in conjunction with them.  I discussed the concept of MOST form and we talked about working to develop plan of care to focus on continuing therapies that would maximize chance of being well enough to return home and limiting therapies not in line with this goal.  All questions answered to the best of my abilities.  I am planning on stopping by tomorrow to see if she has any further questions as well as drop off a copy of the book Hard Choices and MOST form for her to take and review with her family when she is discharged from the hospital.  -Partial code: No intubation, but would like compressions, defibrillation, and bag mask ventilation in the event of cardiac arrest -She understands she has incurable disease.  She is invested in plan to follow-up with [name] to discuss further disease modifying therapy.   -Her son and daughter would be  her surrogate decision makers in the event that she cannot make her medical decisions. -She will continue to consider limits of care and I am planning to drop off a copy of Hard Choices for Loving People as well as a MOST form for her to review with family once she is discharged.  Time in: 1020 Time out: 1140 Total time: 80 minutes  Greater than 50%  of this time was spent counseling and coordinating care related to  the above assessment and plan.  Micheline Rough, MD [facility] [phone]

## 2019-10-22 NOTE — Progress Notes (Signed)
Worsening leukocytosis, left shift, elevated lactic acid.  Procalcitonin is pending.   Following cultures.  Will start gentle IV fluid hydration NS at 75 cc/hr, empiric IV abx IV vancomycin and zosyn for now.  Will de-escalate antibiotics or dc antibiotics once active infective process is ruled out.

## 2019-10-22 NOTE — Progress Notes (Signed)
Pharmacy Antibiotic Note  Amber Medina is a 69 y.o. female admitted on 10/20/2019 with history of chronic A. fib, chronic diastolic dysfunction, anemia, metastatic colon cancer currently on chemotherapy presented to the emergency room with episode of feeling dizzy when getting out of bed. Pharmacy has been consulted for vanc and zosyn for sepsis.  Plan: Vancomycin 2gm IV x 1 then 1750mg  q24h (Scr 0.88, AUC 516.8) Zosyn 3.375g IV Q8H infused over 4hrs. Daily SCr Follow up renal function, cultures and clinical course  Height: 5\' 6"  (167.6 cm) Weight: 295 lb (133.8 kg) IBW/kg (Calculated) : 59.3  Temp (24hrs), Avg:98 F (36.7 C), Min:97.5 F (36.4 C), Max:98.3 F (36.8 C)  Recent Labs  Lab 10/20/19 1010 10/21/19 0504 10/21/19 0840 10/22/19 0448 10/22/19 1647  WBC 31.2*  --  35.2* 36.0*  --   CREATININE 0.95 0.88  --   --   --   LATICACIDVEN  --   --   --   --  2.7*    Estimated Creatinine Clearance: 83.7 mL/min (by C-G formula based on SCr of 0.88 mg/dL).    Allergies  Allergen Reactions  . Compazine [Prochlorperazine Edisylate] Swelling  . Zyrtec [Cetirizine] Swelling    Swelling of nose     Thank you for allowing pharmacy to be a part of this patient's care.  Dolly Rias RPh 10/22/2019, 6:20 PM

## 2019-10-22 NOTE — Evaluation (Signed)
Physical Therapy Evaluation Patient Details Name: Amber Medina MRN: [id number] DOB: January 28, 1951 Today's Date: 10/22/2019   History of Present Illness  69 year old female with history of chronic A. fib, chronic diastolic dysfunction, anemia, metastatic colon cancer currently on chemotherapy presented to the emergency room with episode of feeling dizzy when getting out of bed.  Patient also has progressive deconditioning and weakness.  She is on her fifth cycle of chemo.  Clinical Impression  Pt admitted with above diagnosis.  Pt currently with functional limitations due to the deficits listed below (see PT Problem List). Pt will benefit from skilled PT to increase their independence and safety with mobility to allow discharge to the venue listed below.  Pt requiring +2 assistance for bed mobility. She understands that she is unable to care for herself at home and is agreeable to SNF, but prefers to go to a different one than the last one she was in.     Follow Up Recommendations SNF    Equipment Recommendations  None recommended by PT    Recommendations for Other Services       Precautions / Restrictions Precautions Precautions: Fall      Mobility  Bed Mobility Overal bed mobility: Needs Assistance Bed Mobility: Rolling;Sidelying to Sit;Sit to Supine Rolling: Mod assist;Min assist Sidelying to sit: Total assist;+2 for physical assistance   Sit to supine: Max assist;+2 for physical assistance   General bed mobility comments: Multiple rolls due to having had BM in bed upon arrival. MIN A to roll L and MOD A to roll R. Form sidelying to sit required TOT A. Pt unable to help at all really.  She was able to assist with trunk when returning supine.  Transfers Overall transfer level: Needs assistance Equipment used: Rolling walker (2 wheeled) Transfers: Sit to/from Stand Sit to Stand: Max assist;+2 physical assistance         General transfer comment: Pt performed semi-stand at RW.  Unable to acheive full standing due to combination of L foot pain and pt having BM upon standing attempt.  Ambulation/Gait             General Gait Details: unable  Stairs            Wheelchair Mobility    Modified Rankin (Stroke Patients Only)       Balance Overall balance assessment: Needs assistance Sitting-balance support: Feet supported;Bilateral upper extremity supported;Single extremity supported Sitting balance-Leahy Scale: Fair       Standing balance-Leahy Scale: Zero                               Pertinent Vitals/Pain Pain Assessment: 0-10 Pain Score: 8  Pain Location: L big toe and ankle Pain Descriptors / Indicators: Other (Comment)(stiffness, electric) Pain Intervention(s): Limited activity within patient's tolerance;Monitored during session;Repositioned    Home Living Family/patient expects to be discharged to:: Private residence Living Arrangements: Alone   Type of Home: House Home Access: Stairs to enter;Ramped entrance Entrance Stairs-Rails: Right Entrance Stairs-Number of Steps: 5-6 Home Layout: One level Home Equipment: Wheelchair - [employer] - 4 wheels;Walker - 2 wheels;Cane - single point;Bedside commode;Tub bench(has w/c on loan) Additional Comments: reports having tub bench, but hasn't even seen it due to not having in house care. Was getting HHPT 1x/week.    Prior Function Level of Independence: Needs assistance   Gait / Transfers Assistance Needed: Amb with RW up until 9 days ago and has been bed  bound except for 20 steps to the toilet since then.           Hand Dominance        Extremity/Trunk Assessment   Upper Extremity Assessment Upper Extremity Assessment: Generalized weakness    Lower Extremity Assessment Lower Extremity Assessment: Generalized weakness;LLE deficits/detail LLE: Unable to fully assess due to pain       Communication   Communication: No difficulties  Cognition  Arousal/Alertness: Awake/alert Behavior During Therapy: WFL for tasks assessed/performed Overall Cognitive Status: Within Functional Limits for tasks assessed                                        General Comments      Exercises     Assessment/Plan    PT Assessment Patient needs continued PT services  PT Problem List Decreased strength;Decreased activity tolerance;Decreased balance;Decreased mobility;Decreased knowledge of use of DME       PT Treatment Interventions DME instruction;Gait training;Functional mobility training;Therapeutic activities;Therapeutic exercise;Neuromuscular re-education;Patient/family education    PT Goals (Current goals can be found in the Care Plan section)  Acute Rehab PT Goals Patient Stated Goal: get moving again PT Goal Formulation: With patient Time For Goal Achievement: 11/05/19 Potential to Achieve Goals: Fair    Frequency Min 2X/week   Barriers to discharge Decreased caregiver support      Co-evaluation               AM-PAC PT "6 Clicks" Mobility  Outcome Measure Help needed turning from your back to your side while in a flat bed without using bedrails?: A Lot Help needed moving from lying on your back to sitting on the side of a flat bed without using bedrails?: Total Help needed moving to and from a bed to a chair (including a wheelchair)?: Total Help needed standing up from a chair using your arms (e.g., wheelchair or bedside chair)?: Total Help needed to walk in hospital room?: Total Help needed climbing 3-5 steps with a railing? : Total 6 Click Score: 7    End of Session Equipment Utilized During Treatment: Gait belt Activity Tolerance: Patient limited by pain;Patient limited by fatigue Patient left: in bed;with call bell/phone within reach;with bed alarm set Nurse Communication: Mobility status PT Visit Diagnosis: Other abnormalities of gait and mobility (R26.89);Muscle weakness (generalized) (M62.81)     Time: [id number] PT Time Calculation (min) (ACUTE ONLY): 41 min   Charges:   PT Evaluation $PT Eval Moderate Complexity: 1 Mod PT Treatments $Therapeutic Activity: 23-37 mins        Amber Medina, [place] Pager [phone] 10/22/2019   Amber Medina 10/22/2019, 12:27 PM

## 2019-10-22 NOTE — Progress Notes (Signed)
PROGRESS NOTE  [name] [id number] DOB: 1951/04/24 DOA: 10/20/2019 PCP: [name], MD  HPI/Recap of past 74 hours: 69 year old female with history of chronic A. fib, chronic diastolic dysfunction, anemia, metastatic colon cancer currently on chemotherapy presented to the emergency room with episode of feeling dizzy when getting out of bed.  Patient also has progressive deconditioning and weakness.  She is on her fifth cycle of chemo. In the emergency room, vitals are stable.  CTA was negative for PE and that shows tumors.  10/22/19: Seen and examined.  No new complaints.  Seen by palliative care team.  Appreciate assistance.  Assessment/Plan: Principal Problem:   Near syncope Active Problems:   Chronic diastolic (congestive) heart failure (HCC)   Persistent atrial fibrillation (HCC)   Colon cancer metastasized to liver (HCC)   Leukocytosis   Near syncope: Probably due to deconditioning.  Rule out cardiac arrhythmia.  Echocardiogram showed left ventricular hypertrophy moderately.  LVEF 60 to 65%.  Grade 2 diastolic dysfunction.    Ascending aorta aneurysm measuring 4.1 cm on 2D echo done on 10/19/2019. Maintain blood pressure normotensive Follow-up outpatient  Paroxysmal A. fib: Currently sinus rhythm.  Rule out arrhythmias.  On beta-blockers, Cardizem and amiodarone and rate control.  Therapeutic on Xarelto.  Leukocytosis: Her WBC count keeps increasing.  35,000 today.  No evidence of localized infection.  Suspect leukemoid reaction due to metastatic cancer.  Blood cultures drawn, no antibiotics indicated.  Blood cultures negative to date, continue to follow.  Metastatic colon cancer: Clinical metastasis to liver, CT chest with evidence of metastasis.  Currently on chemo.  Discussed with oncology and they are formulating further plan for her.  Deconditioning: Work with PT OT. PT recommending SNF.  Transition of care consulted to assist with SNF placement. Continue PT  OT with assistance and fall precautions.  Goals of care Palliative care team following Appreciate assistance.   DVT prophylaxis: Xarelto Code Status: Partial Family Communication: None Disposition Plan: Pending clinical improvement.  Anticipate need for more supervision or SNF.   Consultants:   Oncology  Palliative care team.  Procedures:   None  Antimicrobials:   None     Objective: Vitals:   10/21/19 2041 10/21/19 2213 10/21/19 2340 10/22/19 0606  BP: 97/60 (!) 119/58  (!) 103/52  Pulse: 82 89  88  Resp: 14 20  20   Temp: 98.1 F (36.7 C) (!) 97.5 F (36.4 C) 98.1 F (36.7 C) 98.2 F (36.8 C)  TempSrc: Oral Oral Oral Oral  SpO2: 96% 94%  93%  Weight:    133.8 kg  Height:        Intake/Output Summary (Last 24 hours) at 10/22/2019 1424 Last data filed at 10/22/2019 1230 Gross per 24 hour  Intake 1460.17 ml  Output 1000 ml  Net 460.17 ml   Filed Weights   10/20/19 0943 10/22/19 0606  Weight: 122.5 kg 133.8 kg    Exam:  . General: 69 y.o. year-old female well developed well nourished in no acute distress.  Alert and interactive. . Cardiovascular: Regular rate and rhythm with no rubs or gallops.  No thyromegaly or JVD noted.   Marland Kitchen Respiratory: Clear to auscultation with no wheezes or rales. Good inspiratory effort. . Abdomen: Soft nontender nondistended with normal bowel sounds x4 quadrants. . Musculoskeletal: No lower extremity edema. 2/4 pulses in all 4 extremities. Marland Kitchen Psychiatry: Mood is appropriate for condition and setting   Data Reviewed: CBC: Recent Labs  Lab 10/20/19 1010 10/21/19 0840 10/22/19 0448  WBC 31.2* 35.2* 36.0*  NEUTROABS 26.2* 30.4* 31.3*  HGB 8.4* 7.5* 8.2*  HCT 29.8* 26.3* 29.0*  MCV 98.0 96.7 95.7  PLT 237 249 [id number]   Basic Metabolic Panel: Recent Labs  Lab 10/20/19 1010 10/21/19 0504 10/21/19 0840 10/22/19 0842  NA 136 137  --   --   K 3.5 3.7  --   --   CL 99 102  --   --   CO2 20* 25  --   --   GLUCOSE  196* 119*  --   --   BUN 12 14  --   --   CREATININE 0.95 0.88  --   --   CALCIUM 8.3* 7.8*  --   --   MG  --   --  1.6* 2.2  PHOS  --   --  3.9  --    GFR: Estimated Creatinine Clearance: 83.7 mL/min (by C-G formula based on SCr of 0.88 mg/dL). Liver Function Tests: Recent Labs  Lab 10/20/19 1010 10/21/19 0504  AST 27 49*  ALT 11 12  ALKPHOS 213* 241*  BILITOT 1.1 0.6  PROT 6.4* 6.2*  ALBUMIN 1.7* 1.5*   Recent Labs  Lab 10/20/19 1010  LIPASE 16   No results for input(s): AMMONIA in the last 168 hours. Coagulation Profile: Recent Labs  Lab 10/20/19 1010  INR 1.6*   Cardiac Enzymes: No results for input(s): CKTOTAL, CKMB, CKMBINDEX, TROPONINI in the last 168 hours. BNP (last 3 results) No results for input(s): PROBNP in the last 8760 hours. HbA1C: Recent Labs    10/21/19 0504  HGBA1C 5.6   CBG: No results for input(s): GLUCAP in the last 168 hours. Lipid Profile: No results for input(s): CHOL, HDL, LDLCALC, TRIG, CHOLHDL, LDLDIRECT in the last 72 hours. Thyroid Function Tests: Recent Labs    10/21/19 0504  TSH 3.942   Anemia Panel: No results for input(s): VITAMINB12, FOLATE, FERRITIN, TIBC, IRON, RETICCTPCT in the last 72 hours. Urine analysis:    Component Value Date/Time   COLORURINE AMBER (A) 08/13/2019 0222   APPEARANCEUR CLOUDY (A) 08/13/2019 0222   LABSPEC 1.012 08/13/2019 0222   PHURINE 5.0 08/13/2019 0222   GLUCOSEU 50 (A) 08/13/2019 0222   HGBUR SMALL (A) 08/13/2019 0222   BILIRUBINUR NEGATIVE 08/13/2019 0222   KETONESUR NEGATIVE 08/13/2019 0222   PROTEINUR 100 (A) 08/13/2019 0222   NITRITE NEGATIVE 08/13/2019 0222   LEUKOCYTESUR NEGATIVE 08/13/2019 0222   Sepsis Labs: @LABRCNTIP (procalcitonin:4,lacticidven:4)  ) Recent Results (from the past 240 hour(s))  Respiratory Panel by RT PCR (Flu A&B, Covid) - Nasopharyngeal Swab     Status: None   Collection Time: 10/20/19 10:10 AM   Specimen: Nasopharyngeal Swab  Result Value Ref Range  Status   SARS Coronavirus 2 by RT PCR NEGATIVE NEGATIVE Final    Comment: (NOTE) SARS-CoV-2 target nucleic acids are NOT DETECTED. The SARS-CoV-2 RNA is generally detectable in upper respiratoy specimens during the acute phase of infection. The lowest concentration of SARS-CoV-2 viral copies this assay can detect is 131 copies/mL. A negative result does not preclude SARS-Cov-2 infection and should not be used as the sole basis for treatment or other patient management decisions. A negative result may occur with  improper specimen collection/handling, submission of specimen other than nasopharyngeal swab, presence of viral mutation(s) within the areas targeted by this assay, and inadequate number of viral copies (<131 copies/mL). A negative result must be combined with clinical observations, patient history, and epidemiological information. The expected result is  Negative. Fact Sheet for Patients:  PinkCheek.be Fact Sheet for Healthcare Providers:  GravelBags.it This test is not yet ap proved or cleared by the [place] FDA and  has been authorized for detection and/or diagnosis of SARS-CoV-2 by FDA under an Emergency Use Authorization (EUA). This EUA will remain  in effect (meaning this test can be used) for the duration of the COVID-19 declaration under Section 564(b)(1) of the Act, 21 U.S.C. section 360bbb-3(b)(1), unless the authorization is terminated or revoked sooner.    Influenza A by PCR NEGATIVE NEGATIVE Final   Influenza B by PCR NEGATIVE NEGATIVE Final    Comment: (NOTE) The Xpert Xpress SARS-CoV-2/FLU/RSV assay is intended as an aid in  the diagnosis of influenza from Nasopharyngeal swab specimens and  should not be used as a sole basis for treatment. Nasal washings and  aspirates are unacceptable for Xpert Xpress SARS-CoV-2/FLU/RSV  testing. Fact Sheet for  Patients: PinkCheek.be Fact Sheet for Healthcare Providers: GravelBags.it This test is not yet approved or cleared by the [place] FDA and  has been authorized for detection and/or diagnosis of SARS-CoV-2 by  FDA under an Emergency Use Authorization (EUA). This EUA will remain  in effect (meaning this test can be used) for the duration of the  Covid-19 declaration under Section 564(b)(1) of the Act, 21  U.S.C. section 360bbb-3(b)(1), unless the authorization is  terminated or revoked. Performed at [facility], [place] [address] [place], [place] [address]   Culture, blood (routine x 2)     Status: None (Preliminary result)   Collection Time: 10/21/19 11:38 AM   Specimen: BLOOD  Result Value Ref Range Status   Specimen Description   Final    BLOOD RIGHT ARM Performed at [facility] [address] [place], [place] [address]    Special Requests   Final    BOTTLES DRAWN AEROBIC AND ANAEROBIC Blood Culture adequate volume Performed at [facility] [address], [place], [place] [address]    Culture   Final    NO GROWTH < 24 HOURS Performed at [facility] [address] [place], [place] [address]    Report Status PENDING  Incomplete  Culture, blood (routine x 2)     Status: None (Preliminary result)   Collection Time: 10/21/19 11:38 AM   Specimen: BLOOD  Result Value Ref Range Status   Specimen Description   Final    BLOOD LEFT ARM Performed at [facility] [address], [place], [place] [address]    Special Requests   Final    BOTTLES DRAWN AEROBIC ONLY Blood Culture adequate volume Performed at [facility] [address] [place], [place] [address]    Culture   Final    NO GROWTH < 24 HOURS Performed at [facility] [address], [place],  [address]    Report Status PENDING  Incomplete       Studies: CT ABDOMEN PELVIS W CONTRAST  Result Date: 10/21/2019 CLINICAL DATA:  Metastatic colon cancer. Abdominal distension, weakness and diarrhea. EXAM: CT ABDOMEN AND PELVIS WITH CONTRAST TECHNIQUE: Multidetector CT imaging of the abdomen and pelvis was performed using the standard protocol following bolus administration of intravenous contrast. CONTRAST:  191mL OMNIPAQUE IOHEXOL 300 MG/ML  SOLN COMPARISON:  Chest CT from yesterday and CT abdomen/pelvis 08/13/2019 FINDINGS: Lower chest: A few small pulmonary nodules are noted and consistent with metastatic disease as demonstrated on yesterday's chest CT. Streaky  left basilar atelectasis. No effusions or infiltrates. The heart is normal in size. No pericardial effusion. Hepatobiliary: Severe and markedly progressive hepatic metastatic disease. There is now a large confluent mass lesion occupying the left hepatic lobe and measuring 14.5 x 12.0 cm on image 36/2. Numerous lesions are also noted throughout the right hepatic lobe and these have also enlarged. Exophytic tumor versus adjacent adenopathy near the left hepatic lobe on image number 28/2 measures 4 cm. This is unchanged in size. No biliary dilatation. Pancreas: No mass, inflammation or ductal dilatation. Spleen: Normal size.  No focal lesions. Adrenals/Urinary Tract: Stable bilateral adrenal gland nodules. These are likely benign adenomas. The kidneys are unremarkable. No renal lesions or hydronephrosis. The bladder is normal. Stomach/Bowel: The stomach, duodenum and small bowel are unremarkable. No mass lesions or obstructive findings. Stable surgical changes from a right hemicolectomy. Colonic diverticulosis but no findings for obstructing mass. Vascular/Lymphatic: Moderate scattered atherosclerotic calcifications involving the aorta and iliac arteries. No aneurysm or dissection. The major venous structures are patent. Enlarging omental and peritoneal surface lesions. The largest omental lesion  measures 3.9 cm on image 53/2. This previously measured 3.1 cm. Other smaller lesions have enlarged slightly also. No retroperitoneal lymphadenopathy. Reproductive: The uterus and ovaries are unremarkable. Other: No pelvic mass, pelvic adenopathy or free pelvic fluid collections. No inguinal mass or adenopathy. Musculoskeletal: No significant bony findings. IMPRESSION: 1. Significant progression of metastatic hepatic disease suggesting a very aggressive neoplasm. 2. Enlarging omental and peritoneal surface lesions. 3. Small pulmonary nodules consistent with pulmonary metastatic disease. Electronically Signed   By: Marijo Sanes M.D.   On: 10/21/2019 16:12    Scheduled Meds: . amiodarone  100 mg Oral QHS  . Chlorhexidine Gluconate Cloth  6 each Topical Daily  . diltiazem  240 mg Oral QHS  . ferrous gluconate  324 mg Oral TID WC  . metoprolol succinate  25 mg Oral QHS  . rivaroxaban  20 mg Oral Q supper  . sodium chloride flush  10-40 mL Intracatheter Q12H  . sodium chloride flush  3 mL Intravenous Q12H    Continuous Infusions: . sodium chloride       LOS: 2 days     Kayleen Memos, MD [facility] Pager [phone]  If 7PM-7AM, please contact night-coverage [email or web address] Password [facility] 10/22/2019, 2:24 PM

## 2019-10-22 NOTE — Progress Notes (Signed)
CRITICAL VALUE ALERT  Critical Value:  Lactic Acid 2.7  Date & Time Notied:  10/22/19; 1742  Provider Notified: Dr. Nevada Crane notified at [facility]  Orders Received/Actions taken: Awaiting orders.

## 2019-10-23 DIAGNOSIS — I5032 Chronic diastolic (congestive) heart failure: Secondary | ICD-10-CM

## 2019-10-23 LAB — LACTIC ACID, PLASMA: Lactic Acid, Venous: 1.6 mmol/L (ref 0.5–1.9)

## 2019-10-23 LAB — CBC WITH DIFFERENTIAL/PLATELET
Abs Immature Granulocytes: 1.08 10*3/uL — ABNORMAL HIGH (ref 0.00–0.07)
Basophils Absolute: 0.1 10*3/uL (ref 0.0–0.1)
Basophils Relative: 0 %
Eosinophils Absolute: 0 10*3/uL (ref 0.0–0.5)
Eosinophils Relative: 0 %
HCT: 27.8 % — ABNORMAL LOW (ref 36.0–46.0)
Hemoglobin: 7.9 g/dL — ABNORMAL LOW (ref 12.0–15.0)
Immature Granulocytes: 3 %
Lymphocytes Relative: 3 %
Lymphs Abs: 1.3 10*3/uL (ref 0.7–4.0)
MCH: 27.3 pg (ref 26.0–34.0)
MCHC: 28.4 g/dL — ABNORMAL LOW (ref 30.0–36.0)
MCV: 96.2 fL (ref 80.0–100.0)
Monocytes Absolute: 2.6 10*3/uL — ABNORMAL HIGH (ref 0.1–1.0)
Monocytes Relative: 7 %
Neutro Abs: 34.4 10*3/uL — ABNORMAL HIGH (ref 1.7–7.7)
Neutrophils Relative %: 87 %
Platelets: 183 10*3/uL (ref 150–400)
RBC: 2.89 MIL/uL — ABNORMAL LOW (ref 3.87–5.11)
RDW: 20.1 % — ABNORMAL HIGH (ref 11.5–15.5)
WBC: 39.5 10*3/uL — ABNORMAL HIGH (ref 4.0–10.5)
nRBC: 0 % (ref 0.0–0.2)

## 2019-10-23 LAB — COMPREHENSIVE METABOLIC PANEL
ALT: 11 U/L (ref 0–44)
AST: 31 U/L (ref 15–41)
Albumin: 1.5 g/dL — ABNORMAL LOW (ref 3.5–5.0)
Alkaline Phosphatase: 248 U/L — ABNORMAL HIGH (ref 38–126)
Anion gap: 11 (ref 5–15)
BUN: 15 mg/dL (ref 8–23)
CO2: 23 mmol/L (ref 22–32)
Calcium: 8 mg/dL — ABNORMAL LOW (ref 8.9–10.3)
Chloride: 101 mmol/L (ref 98–111)
Creatinine, Ser: 0.95 mg/dL (ref 0.44–1.00)
GFR calc Af Amer: >60 mL/min (ref >60)
GFR calc non Af Amer: >60 mL/min (ref >60)
Glucose, Bld: 100 mg/dL — ABNORMAL HIGH (ref 70–99)
Potassium: 3.6 mmol/L (ref 3.5–5.1)
Sodium: 135 mmol/L (ref 135–145)
Total Bilirubin: 1.1 mg/dL (ref 0.3–1.2)
Total Protein: 5.6 g/dL — ABNORMAL LOW (ref 6.5–8.1)

## 2019-10-23 LAB — PROCALCITONIN: Procalcitonin: 2.99 ng/mL

## 2019-10-23 MED ORDER — SODIUM CHLORIDE 0.9 % IV SOLN: INTRAVENOUS | Status: AC

## 2019-10-23 MED ORDER — DIPHENHYDRAMINE HCL 25 MG PO CAPS
25.0000 mg | ORAL_CAPSULE | Freq: Once | ORAL | Status: AC
  Administered 2019-10-23: 25 mg via ORAL
  Filled 2019-10-23: qty 1

## 2019-10-23 NOTE — Progress Notes (Signed)
Daily Progress Note   Patient Name: Amber Medina       Date: 10/23/2019 DOB: 23-Ysenia-1952  Age: 69 y.o. MRN#: [id number] Attending Physician: Kayleen Memos, DO Primary Care Physician: [name], MD Admit Date: 10/20/2019  Reason for Consultation/Follow-up: Establishing goals of care  Subjective: I met briefly today with Amber Medina.    Brief review of conversation yesterday.  She denies questions.  Introduced MOST form and left copy with her to take on discharge to review with family.  Length of Stay: 3  Current Medications: Scheduled Meds:  . amiodarone  100 mg Oral QHS  . Chlorhexidine Gluconate Cloth  6 each Topical Daily  . diltiazem  240 mg Oral QHS  . ferrous gluconate  324 mg Oral TID WC  . metoprolol succinate  25 mg Oral QHS  . rivaroxaban  20 mg Oral Q supper  . sodium chloride flush  10-40 mL Intracatheter Q12H  . sodium chloride flush  3 mL Intravenous Q12H    Continuous Infusions: . sodium chloride    . sodium chloride    . piperacillin-tazobactam (ZOSYN)  IV 3.375 g (10/23/19 1728)  . vancomycin      PRN Meds: sodium chloride, acetaminophen **OR** acetaminophen, ondansetron (ZOFRAN) IV, sodium chloride flush, sodium chloride flush  Physical Exam  General: Alert, awake, in no acute distress.  Heart: Regular rate and rhythm. No murmur appreciated. Lungs: Good air movement, clear Neuro: Grossly intact, nonfocal.          Vital Signs: BP (!) 113/57 (BP Location: Right Arm) Comment: kasia notified   Pulse 87   Temp 98.7 F (37.1 C) (Oral)   Resp 20   Ht 5' 6"  (1.676 m)   Wt 133.8 kg   LMP  (LMP Unknown)   SpO2 94%   BMI 47.61 kg/m  SpO2: SpO2: 94 % O2 Device: O2 Device: Room Air O2 Flow Rate:    Intake/output summary:   Intake/Output Summary  (Last 24 hours) at 10/23/2019 2136 Last data filed at 10/23/2019 1700 Gross per 24 hour  Intake 2360.08 ml  Output 1450 ml  Net 910.08 ml   LBM: Last BM Date: 10/23/19 Baseline Weight: Weight: 122.5 kg Most recent weight: Weight: 133.8 kg       Palliative Assessment/Data:    Flowsheet Rows  Most Recent Value  Intake Tab  Referral Department  Hospitalist  Unit at Time of Referral  Med/Surg Unit  Palliative Care Primary Diagnosis  Cancer  Date Notified  10/21/19  Palliative Care Type  New Palliative care  Reason for referral  Clarify Goals of Care  Date of Admission  10/20/19  Date first seen by Palliative Care  10/22/19  # of days Palliative referral response time  1 Day(s)  # of days IP prior to Palliative referral  1  Clinical Assessment  Palliative Performance Scale Score  60%  Psychosocial & Spiritual Assessment  Palliative Care Outcomes  Patient/Family meeting held?  Yes  Who was at the meeting?  Patient  Palliative Care Outcomes  Clarified goals of care      Patient Active Problem List   Diagnosis Date Noted  . Near syncope 10/20/2019  . Leukocytosis 10/20/2019  . Colon cancer metastasized to liver ([place]) 08/17/2019  . Goals of care, counseling/discussion 08/17/2019  . Liver lesion 08/13/2019  . Abdominal distension 08/13/2019  . Acute pulmonary embolism with acute cor pulmonale ([place]) 02/05/2018  . Adenocarcinoma of colon ([place]) 02/05/2018  . Colonic mass 06/15/2017  . Anemia 06/11/2017  . Chronic diastolic (congestive) heart failure ([place]) 10/11/2015  . Persistent atrial fibrillation ([place]) 10/11/2015  . OSA (obstructive sleep apnea)   . Acute respiratory failure with hypoxia ([place])   . Hyperglycemia 09/06/2015  . Anasarca 09/06/2015  . Obesity, morbid ([place]) 09/06/2015    Palliative Care Assessment & Plan   Patient Profile: Amber Medina is a 69 year old female with past medical history of chronic A. fib, diastolic dysfunction, anemia, metastatic colon  cancer on chemotherapy, arthritis who presented to the emergency room with dizziness and weakness.  Work-up this admission reveals what appears to be disease progression with metastasis to liver and lungs.  [name] has already evaluated patient and plan for modification of her chemotherapy regimen to FOLFIRI if she shows further progression in abdominal disease.  Plan is for her to follow-up with him again as an outpatient next week.  Palliative consulted for goals of care.  Recommendations/Plan: -Partial code: No intubation, but would like compressions, defibrillation, and bag mask ventilation in the event of cardiac arrest -She understands she has incurable disease.  She is invested in plan to follow-up with [name] to discuss further disease modifying therapy.   -Her son and daughter would be her surrogate decision makers in the event that she cannot make her medical decisions. -She will continue to consider limits of care. Today I dropped off a copy of [employer] for [employer] as well as a MOST form for her to review with family once she is discharged. - She has my contact information and will call if she would like to further discuss this admission.  Otherwise, palliative would not continue to follow daily. -Please call if there are specific areas with which we can be of assistance in the care of Amber Medina.  Code Status:    Code Status Orders  (From admission, onward)         Start     Ordered   10/20/19 1716  Limited resuscitation (code)  Continuous    Question Answer Comment  In the event of cardiac or respiratory ARREST: Initiate Code Blue, Call Rapid Response Yes   In the event of cardiac or respiratory ARREST: Perform CPR Yes   In the event of cardiac or respiratory ARREST: Perform Intubation/Mechanical Ventilation No   In the event  of cardiac or respiratory ARREST: Use NIPPV/BiPAp only if indicated Yes   In the event of cardiac or respiratory ARREST: Administer  ACLS medications if indicated Yes   In the event of cardiac or respiratory ARREST: Perform Defibrillation or Cardioversion if indicated Yes      10/20/19 1717        Code Status History    Date Active Date Inactive Code Status Order ID Comments User Context   08/13/2019 1228 08/23/2019 1444 Full Code [id number]  Verlee Monte, MD Inpatient   02/05/2018 2159 02/16/2018 1740 Full Code [id number]  Etta Quill, DO ED   06/12/2017 0127 06/16/2017 2127 Full Code [id number]  Vianne Bulls, MD Inpatient   06/11/2017 1857 06/12/2017 0127 DNR [id number]  Oswald Hillock, MD Inpatient   09/08/2015 0917 09/15/2015 1635 Full Code [id number]  Jonetta Osgood, MD Inpatient   09/07/2015 0011 09/08/2015 0917 Full Code [id number]  Reubin Milan, MD Inpatient   Advance Care Planning Activity       Prognosis:   Guarded  Discharge Planning:  To Be Determined  Care plan was discussed with patient  Thank you for allowing the Palliative Medicine Team to assist in the care of this patient.   Total Time 20 Prolonged Time Billed No      Greater than 50%  of this time was spent counseling and coordinating care related to the above assessment and plan.  Micheline Rough, MD  Please contact Palliative Medicine Team phone at [phone] for questions and concerns.

## 2019-10-23 NOTE — Progress Notes (Signed)
PROGRESS NOTE  Amber Medina [id number] DOB: 02/16/1951 DOA: 10/20/2019 PCP: [name], MD  HPI/Recap of past 78 hours: 69 year old female with history of chronic A. fib, chronic diastolic dysfunction, anemia, metastatic colon cancer currently on chemotherapy presented to the emergency room with episode of feeling dizzy when getting out of bed.  Patient also has progressive deconditioning and weakness.  She is on her fifth cycle of chemo. In the emergency room, vitals are stable.  CTA was negative for PE and that shows tumors.  10/23/19: Seen and examined.  Reports feeling weak with nausea and poor appetite.  T-max 99.4 overnight.  Started on IV antibiotics empirically on 1/16.  Elevated procalcitonin and elevated lactic acid improving on IV antibiotics.   Assessment/Plan: Principal Problem:   Near syncope Active Problems:   Chronic diastolic (congestive) heart failure (HCC)   Persistent atrial fibrillation (HCC)   Colon cancer metastasized to liver (HCC)   Leukocytosis   Near syncope: Probably due to deconditioning.  Rule out cardiac arrhythmia.  Echocardiogram showed left ventricular hypertrophy moderately.  LVEF 60 to 65%.  Grade 2 diastolic dysfunction.    Ascending aorta aneurysm measuring 4.1 cm on 2D echo done on 10/19/2019. Maintain blood pressure normotensive Follow-up outpatient  Paroxysmal A. fib: Currently sinus rhythm.  Rule out arrhythmias.  On beta-blockers, Cardizem and amiodarone and rate control.  Therapeutic on Xarelto.  Leukocytosis/elevated procalcitonin and elevated lactic acid:  T-max 99.4 overnight. Elevated procalcitonin up to 3.14, elevated lactic acid up to 2.7 Markers are trending down on IV antibiotics Started on IV antibiotics IV vancomycin and Zosyn empirically on 10/22/2019.   No clear sources of infection Urine cultures in process Blood cultures no growth to date Continue antibiotics for now Continue to monitor fever curve and WBC    Metastatic colon cancer: Clinical metastasis to liver, CT chest with evidence of metastasis.  Currently on chemo.  Will follow up with oncology on Monday, 10/24/2019.  Deconditioning: Work with PT OT. PT recommending SNF.  Transition of care consulted to assist with SNF placement. Continue PT OT with assistance and fall precautions.  Goals of care Palliative care team following Appreciate assistance.   DVT prophylaxis: Xarelto Code Status: Partial Family Communication: None Disposition Plan: Pending clinical improvement.  Anticipate need for more supervision or SNF.   Consultants:   Oncology  Palliative care team.  Procedures:   None  Antimicrobials:   None     Objective: Vitals:   10/22/19 1433 10/22/19 2059 10/23/19 0607 10/23/19 1405  BP: (!) 118/58 (!) 136/49 (!) 103/50 (!) 113/57  Pulse: 84 91 83 87  Resp:  20 19 20   Temp: 98.3 F (36.8 C) 99.4 F (37.4 C) 98.8 F (37.1 C) 98.7 F (37.1 C)  TempSrc: Oral Oral Oral Oral  SpO2: 95% 94% 92% 94%  Weight:   133.8 kg   Height:        Intake/Output Summary (Last 24 hours) at 10/23/2019 1526 Last data filed at 10/23/2019 1406 Gross per 24 hour  Intake 2246.08 ml  Output 500 ml  Net 1746.08 ml   Filed Weights   10/20/19 0943 10/22/19 0606 10/23/19 0607  Weight: 122.5 kg 133.8 kg 133.8 kg    Exam:  . General: 69 y.o. year-old female well-developed well-nourished no acute distress.  Appears weak.  Alert and interactive.   . Cardiovascular: Regular rate and rhythm no rubs or gallops.   Marland Kitchen Respiratory: Clear to auscultation with no wheezes or rales.  Good inspiratory effort. Marland Kitchen  Abdomen: Obese bowel sounds present.   . Musculoskeletal: No lower extremity edema bilaterally.   Marland Kitchen Psychiatry: Mood is appropriate for condition and setting.   Data Reviewed: CBC: Recent Labs  Lab 10/20/19 1010 10/21/19 0840 10/22/19 0448 10/23/19 0448  WBC 31.2* 35.2* 36.0* 39.5*  NEUTROABS 26.2* 30.4* 31.3*  34.4*  HGB 8.4* 7.5* 8.2* 7.9*  HCT 29.8* 26.3* 29.0* 27.8*  MCV 98.0 96.7 95.7 96.2  PLT 237 249 210 [id number]   Basic Metabolic Panel: Recent Labs  Lab 10/20/19 1010 10/21/19 0504 10/21/19 0840 10/22/19 0842 10/23/19 0448  NA 136 137  --   --  135  K 3.5 3.7  --   --  3.6  CL 99 102  --   --  101  CO2 20* 25  --   --  23  GLUCOSE 196* 119*  --   --  100*  BUN 12 14  --   --  15  CREATININE 0.95 0.88  --   --  0.95  CALCIUM 8.3* 7.8*  --   --  8.0*  MG  --   --  1.6* 2.2  --   PHOS  --   --  3.9  --   --    GFR: Estimated Creatinine Clearance: 77.5 mL/min (by C-G formula based on SCr of 0.95 mg/dL). Liver Function Tests: Recent Labs  Lab 10/20/19 1010 10/21/19 0504 10/23/19 0448  AST 27 49* 31  ALT 11 12 11   ALKPHOS 213* 241* 248*  BILITOT 1.1 0.6 1.1  PROT 6.4* 6.2* 5.6*  ALBUMIN 1.7* 1.5* 1.5*   Recent Labs  Lab 10/20/19 1010  LIPASE 16   No results for input(s): AMMONIA in the last 168 hours. Coagulation Profile: Recent Labs  Lab 10/20/19 1010  INR 1.6*   Cardiac Enzymes: No results for input(s): CKTOTAL, CKMB, CKMBINDEX, TROPONINI in the last 168 hours. BNP (last 3 results) No results for input(s): PROBNP in the last 8760 hours. HbA1C: Recent Labs    10/21/19 0504  HGBA1C 5.6   CBG: No results for input(s): GLUCAP in the last 168 hours. Lipid Profile: No results for input(s): CHOL, HDL, LDLCALC, TRIG, CHOLHDL, LDLDIRECT in the last 72 hours. Thyroid Function Tests: Recent Labs    10/21/19 0504  TSH 3.942   Anemia Panel: No results for input(s): VITAMINB12, FOLATE, FERRITIN, TIBC, IRON, RETICCTPCT in the last 72 hours. Urine analysis:    Component Value Date/Time   COLORURINE AMBER (A) 08/13/2019 0222   APPEARANCEUR CLOUDY (A) 08/13/2019 0222   LABSPEC 1.012 08/13/2019 0222   PHURINE 5.0 08/13/2019 0222   GLUCOSEU 50 (A) 08/13/2019 0222   HGBUR SMALL (A) 08/13/2019 0222   BILIRUBINUR NEGATIVE 08/13/2019 0222   KETONESUR NEGATIVE  08/13/2019 0222   PROTEINUR 100 (A) 08/13/2019 0222   NITRITE NEGATIVE 08/13/2019 0222   LEUKOCYTESUR NEGATIVE 08/13/2019 0222   Sepsis Labs: @LABRCNTIP (procalcitonin:4,lacticidven:4)  ) Recent Results (from the past 240 hour(s))  Respiratory Panel by RT PCR (Flu A&B, Covid) - Nasopharyngeal Swab     Status: None   Collection Time: 10/20/19 10:10 AM   Specimen: Nasopharyngeal Swab  Result Value Ref Range Status   SARS Coronavirus 2 by RT PCR NEGATIVE NEGATIVE Final    Comment: (NOTE) SARS-CoV-2 target nucleic acids are NOT DETECTED. The SARS-CoV-2 RNA is generally detectable in upper respiratoy specimens during the acute phase of infection. The lowest concentration of SARS-CoV-2 viral copies this assay can detect is 131 copies/mL. A negative result does not  preclude SARS-Cov-2 infection and should not be used as the sole basis for treatment or other patient management decisions. A negative result may occur with  improper specimen collection/handling, submission of specimen other than nasopharyngeal swab, presence of viral mutation(s) within the areas targeted by this assay, and inadequate number of viral copies (<131 copies/mL). A negative result must be combined with clinical observations, patient history, and epidemiological information. The expected result is Negative. Fact Sheet for Patients:  PinkCheek.be Fact Sheet for Healthcare Providers:  GravelBags.it This test is not yet ap proved or cleared by the [place] FDA and  has been authorized for detection and/or diagnosis of SARS-CoV-2 by FDA under an Emergency Use Authorization (EUA). This EUA will remain  in effect (meaning this test can be used) for the duration of the COVID-19 declaration under Section 564(b)(1) of the Act, 21 U.S.C. section 360bbb-3(b)(1), unless the authorization is terminated or revoked sooner.    Influenza A by PCR NEGATIVE NEGATIVE  Final   Influenza B by PCR NEGATIVE NEGATIVE Final    Comment: (NOTE) The Xpert Xpress SARS-CoV-2/FLU/RSV assay is intended as an aid in  the diagnosis of influenza from Nasopharyngeal swab specimens and  should not be used as a sole basis for treatment. Nasal washings and  aspirates are unacceptable for Xpert Xpress SARS-CoV-2/FLU/RSV  testing. Fact Sheet for Patients: PinkCheek.be Fact Sheet for Healthcare Providers: GravelBags.it This test is not yet approved or cleared by the [place] FDA and  has been authorized for detection and/or diagnosis of SARS-CoV-2 by  FDA under an Emergency Use Authorization (EUA). This EUA will remain  in effect (meaning this test can be used) for the duration of the  Covid-19 declaration under Section 564(b)(1) of the Act, 21  U.S.C. section 360bbb-3(b)(1), unless the authorization is  terminated or revoked. Performed at [facility], [place] [address] [place], [place] [address]   Culture, blood (routine x 2)     Status: None (Preliminary result)   Collection Time: 10/21/19 11:38 AM   Specimen: BLOOD  Result Value Ref Range Status   Specimen Description   Final    BLOOD RIGHT ARM Performed at [facility] [address] [place], [place] [address]    Special Requests   Final    BOTTLES DRAWN AEROBIC AND ANAEROBIC Blood Culture adequate volume Performed at [facility] [address], [place], [place] [address]    Culture   Final    NO GROWTH 2 DAYS Performed at [facility] [address], [place], [place] [address]    Report Status PENDING  Incomplete  Culture, blood (routine x 2)     Status: None (Preliminary result)   Collection Time: 10/21/19 11:38 AM   Specimen: BLOOD  Result Value Ref Range Status   Specimen Description   Final    BLOOD LEFT ARM Performed at [facility] [address], [place], [place] [address]    Special Requests   Final    BOTTLES DRAWN AEROBIC ONLY Blood Culture adequate volume Performed at [facility] [address] [place], [place] [address]    Culture   Final    NO GROWTH 2 DAYS Performed at [facility] [address], [place], [place] [address]    Report Status PENDING  Incomplete  MRSA PCR Screening     Status: None   Collection Time: 10/22/19  6:04 PM   Specimen: Nasal Mucosa; Nasopharyngeal  Result Value Ref Range Status   MRSA by PCR NEGATIVE NEGATIVE Final    Comment:        The GeneXpert MRSA Assay (FDA approved for NASAL specimens only), is one component of a comprehensive MRSA colonization surveillance program. It is not intended to diagnose MRSA infection nor to guide or monitor treatment for MRSA infections. Performed at [facility], [place] [address], [place], [place] [address]       Studies: No results found.  Scheduled Meds: . amiodarone  100 mg Oral QHS  . Chlorhexidine Gluconate Cloth  6 each Topical Daily  . diltiazem  240 mg Oral QHS  . ferrous gluconate  324 mg Oral TID WC  . metoprolol succinate  25 mg Oral QHS  . rivaroxaban  20 mg Oral Q supper  . sodium chloride flush  10-40 mL Intracatheter Q12H  . sodium chloride flush  3 mL Intravenous Q12H    Continuous Infusions: . sodium chloride    . sodium chloride    . piperacillin-tazobactam (ZOSYN)  IV 3.375 g (10/23/19 0957)  . vancomycin       LOS: 3 days     Kayleen Memos, MD [facility] Pager [phone]  If 7PM-7AM, please contact night-coverage [email or web address] Password [facility] 10/23/2019, 3:26 PM

## 2019-10-24 LAB — CREATININE, SERUM
Creatinine, Ser: 1.02 mg/dL — ABNORMAL HIGH (ref 0.44–1.00)
GFR calc Af Amer: >60 mL/min (ref >60)
GFR calc non Af Amer: 56 mL/min — ABNORMAL LOW (ref >60)

## 2019-10-24 LAB — TYPE AND SCREEN
ABO/RH(D): O NEG
Antibody Screen: NEGATIVE
Unit division: 0

## 2019-10-24 LAB — CBC
HCT: 28.3 % — ABNORMAL LOW (ref 36.0–46.0)
Hemoglobin: 7.9 g/dL — ABNORMAL LOW (ref 12.0–15.0)
MCH: 27.4 pg (ref 26.0–34.0)
MCHC: 27.9 g/dL — ABNORMAL LOW (ref 30.0–36.0)
MCV: 98.3 fL (ref 80.0–100.0)
Platelets: 193 10*3/uL (ref 150–400)
RBC: 2.88 MIL/uL — ABNORMAL LOW (ref 3.87–5.11)
RDW: 19.7 % — ABNORMAL HIGH (ref 11.5–15.5)
WBC: 32.6 10*3/uL — ABNORMAL HIGH (ref 4.0–10.5)
nRBC: 0 % (ref 0.0–0.2)

## 2019-10-24 LAB — BPAM RBC
Blood Product Expiration Date: 202102072359
ISSUE DATE / TIME: 202101151723
Unit Type and Rh: 9500

## 2019-10-24 LAB — URINE CULTURE: Culture: NO GROWTH

## 2019-10-24 NOTE — TOC Initial Note (Signed)
Transition of Care [facility]) - Initial/Assessment Note    Patient Details  Name: Zanea Airington MRN: [id number] Date of Birth: [birth year]  Transition of Care [facility]) CM/SW Contact:    Trish Mage, LCSW Phone Number: 10/24/2019, 12:16 PM  Clinical Narrative:   Ms Cerny is here for an episode of feeling dizzy when getting out of bed. She also has progressive deconditioning and weakness as she is on her fifth cycle of chemo.  She was recently at [facility] for rehab, so knows about the process and is open to returning.  However, she is clear she does not want to return to [facility].  She does not currently have Chemo scheduled again, but states she knows she needs to get back on rotation as she missed a couple of sessions due to her hospitalization. She lives alone, cites a neighbor and her niece as ocal supports, and a daughter and son in [place] and [place] respectively as distant supports.  Bed search sent out. TOC will continue to follow during the course of hospitalization.                Expected Discharge Plan: Skilled Nursing Facility Barriers to Discharge: SNF Pending bed offer   Patient Goals and CMS Choice Patient states their goals for this hospitalization and ongoing recovery are:: "I definately need to go back to rehab.  But I don't want to go to [facility]." CMS Medicare.gov Compare Post Acute Care list provided to:: Patient Choice offered to / list presented to : Patient  Expected Discharge Plan and Services Expected Discharge Plan: [place]   Discharge Planning Services: CM Consult Post Acute Care Choice: [place] Living arrangements for the past 2 months: Single Family Home                                      Prior Living Arrangements/Services Living arrangements for the past 2 months: Single Family Home Lives with:: Self Patient language and need for interpreter reviewed:: Yes Do you feel safe going back to the place where you live?:  Yes      Need for Family Participation in Patient Care: Yes (Comment) Care giver support system in place?: Yes (comment) Current home services: DME Criminal Activity/Legal Involvement Pertinent to Current Situation/Hospitalization: No - Comment as needed  Activities of Daily Living Home Assistive Devices/Equipment: Eyeglasses, Environmental consultant (specify type), Cane (specify quad or straight)(4 wheeled walker, single point cane) ADL Screening (condition at time of admission) Patient's cognitive ability adequate to safely complete daily activities?: Yes Is the patient deaf or have difficulty hearing?: No Does the patient have difficulty seeing, even when wearing glasses/contacts?: No Does the patient have difficulty concentrating, remembering, or making decisions?: No Patient able to express need for assistance with ADLs?: Yes Does the patient have difficulty dressing or bathing?: Yes Independently performs ADLs?: No Communication: Independent Dressing (OT): Needs assistance Is this a change from baseline?: Change from baseline, expected to last >3 days Grooming: Needs assistance Is this a change from baseline?: Change from baseline, expected to last >3 days Feeding: Needs assistance Is this a change from baseline?: Change from baseline, expected to last >3 days Bathing: Needs assistance Is this a change from baseline?: Change from baseline, expected to last >3 days Toileting: Needs assistance Is this a change from baseline?: Change from baseline, expected to last >3days In/Out Bed: Needs assistance Is this a change from  baseline?: Change from baseline, expected to last >3 days Walks in Home: Needs assistance Is this a change from baseline?: Change from baseline, expected to last >3 days Does the patient have difficulty walking or climbing stairs?: Yes(secondary to weakness) Weakness of Legs: Both Weakness of Arms/Hands: Both  Permission Sought/Granted Permission sought to share information  with : Family Supports Permission granted to share information with : Yes, Verbal Permission Granted  Share Information with NAME: Susanne Greenhouse     Permission granted to share info w Relationship: daughter  Permission granted to share info w Contact Information: [phone]  Emotional Assessment Appearance:: Appears stated age Attitude/Demeanor/Rapport: Engaged Affect (typically observed): Appropriate Orientation: : Oriented to Self, Oriented to Place, Oriented to  Time, Oriented to Situation Alcohol / Substance Use: Not Applicable Psych Involvement: No (comment)  Admission diagnosis:  Near syncope [R55] Patient Active Problem List   Diagnosis Date Noted  . Near syncope 10/20/2019  . Leukocytosis 10/20/2019  . Colon cancer metastasized to liver ([place]) 08/17/2019  . Goals of care, counseling/discussion 08/17/2019  . Liver lesion 08/13/2019  . Abdominal distension 08/13/2019  . Acute pulmonary embolism with acute cor pulmonale ([place]) 02/05/2018  . Adenocarcinoma of colon ([place]) 02/05/2018  . Colonic mass 06/15/2017  . Anemia 06/11/2017  . Chronic diastolic (congestive) heart failure ([place]) 10/11/2015  . Persistent atrial fibrillation ([place]) 10/11/2015  . OSA (obstructive sleep apnea)   . Acute respiratory failure with hypoxia ( [place])   . Hyperglycemia 09/06/2015  . Anasarca 09/06/2015  . Obesity, morbid ([place]) 09/06/2015   PCP:  [name], MD Pharmacy:   [id number] - , [place] [place] [address] Phone: [phone] Fax: [phone]     Social Determinants of Health (SDOH) Interventions    Readmission Risk Interventions Readmission Risk Prevention Plan 08/22/2019  Transportation Screening Complete  PCP or Specialist Appt within 3-5 Days Not Complete  Not Complete comments Not ready  [place] or [place] Not Complete  HRI or Home Care Consult comments NA  Social Work  Consult for [place] Planning/Counseling Not Complete  SW consult not completed comments NA  Palliative Care Screening Not Applicable  Medication Review Press photographer) Complete  Some recent data might be hidden

## 2019-10-24 NOTE — Progress Notes (Signed)
1st Attempt to gain report from [facility]. RN was in another room. Mya Suell RN

## 2019-10-24 NOTE — Progress Notes (Signed)
PROGRESS NOTE  Amber Medina OW:[id number] DOB: 14-Dec-1950 DOA: 10/20/2019 PCP: [name], MD  HPI/Recap of past 30 hours: 69 year old female with history of chronic A. fib, chronic diastolic CHF, anemia of chronic disease, metastatic colon cancer currently on chemotherapy presented to the emergency room with episode of feeling dizzy when getting out of bed.  Patient also has progressive deconditioning and weakness.  She is on her fifth cycle of chemo.  In the ED, CTA was negative for PE.  Due to elevated procalcitonin and lactic acid she was started on empiric IV abx on 10/22/19.  Cultures are negative to date.  No clear source of infection thus far but WBC, procalcitonin and lactic acid are trending down.  Will complete 5 days of IV antibiotics.  10/24/19:  Seen and examined.  She has no new complaints.  She states she would like to continue chemotherapy and see what would happen.  Discussed with Dr.  Benay Spice.  Transferring to 6A   Assessment/Plan: Principal Problem:   Near syncope Active Problems:   Chronic diastolic (congestive) heart failure (HCC)   Persistent atrial fibrillation (HCC)   Colon cancer metastasized to liver (HCC)   Leukocytosis   Near syncope: Probably due to deconditioning.  In sinus rhythm.  Echocardiogram showed left ventricular hypertrophy moderately.  LVEF 60 to 65%.  Grade 2 diastolic dysfunction.  No recurrence while in the hospital.  Ascending aorta aneurysm measuring 4.1 cm on 2D echo done on 10/19/2019. Maintain blood pressure normotensive Follow-up outpatient with repeat imaging  Paroxysmal A. fib: Currently sinus rhythm.  Rule out arrhythmias.  On beta-blockers, Cardizem and amiodarone and rate control.  On Xarelto for CVA prevention.  Leukocytosis/elevated procalcitonin and elevated lactic acid:  1/16 Elevated procalcitonin up to 3.14, elevated lactic acid up to 2.7 Markers are trending down on IV antibiotics Dc IV vancomycin and continue Zosyn  for total of 5 days.  MRSA negative. Started on IV antibiotics IV vancomycin and Zosyn empirically on 10/22/2019.   No clear sources of infection Cx negative to date  Metastatic colon cancer: Clinical metastasis to liver, CT chest with evidence of metastasis.  Oncology following.  Appreciate assistance.   Deconditioning:  PT recommending SNF.  Transition of care consulted to assist with SNF placement. Continue PT OT with assistance and fall precautions.  Goals of care Palliative care team following Appreciate assistance.   DVT prophylaxis: Xarelto Code Status: Partial Family Communication: None Disposition Plan: Pending clinical improvement.  Will plan to dc to SNF when oncology signs off.  Consultants:   Oncology  Palliative care team.  Procedures:   None  Antimicrobials:   None     Objective: Vitals:   10/23/19 1405 10/23/19 2213 10/24/19 0613 10/24/19 1138  BP: (!) 113/57 (!) 122/58 (!) 105/48 (!) 106/57  Pulse: 87 85 77 81  Resp: 20 19 20  (!) 22  Temp: 98.7 F (37.1 C) 97.9 F (36.6 C) 98 F (36.7 C) 98.1 F (36.7 C)  TempSrc: Oral Oral Oral Oral  SpO2: 94% 93% 93% 95%  Weight:      Height:        Intake/Output Summary (Last 24 hours) at 10/24/2019 1243 Last data filed at 10/24/2019 0300 Gross per 24 hour  Intake 2222.67 ml  Output 1450 ml  Net 772.67 ml   Filed Weights   10/20/19 0943 10/22/19 0606 10/23/19 0607  Weight: 122.5 kg 133.8 kg 133.8 kg    Exam:  . General: 69 y.o. year-old female obese in  no acute distress.  Alert and interactive.   . Cardiovascular: Regular rate and rhythm no rubs or gallops.   Marland Kitchen Respiratory: Clear to Auscultation with No Wheezes or Rales.   . Abdomen: Obese with bowel sounds present. . Musculoskeletal: No lower extremity edema bilaterally. Psychiatry: Mood is appropriate for condition and setting.  Data Reviewed: CBC: Recent Labs  Lab 10/20/19 1010 10/21/19 0840 10/22/19 0448 10/23/19 0448  10/24/19 0913  WBC 31.2* 35.2* 36.0* 39.5* 32.6*  NEUTROABS 26.2* 30.4* 31.3* 34.4*  --   HGB 8.4* 7.5* 8.2* 7.9* 7.9*  HCT 29.8* 26.3* 29.0* 27.8* 28.3*  MCV 98.0 96.7 95.7 96.2 98.3  PLT 237 249 210 183 0000000   Basic Metabolic Panel: Recent Labs  Lab 10/20/19 1010 10/21/19 0504 10/21/19 0840 10/22/19 0842 10/23/19 0448 10/24/19 0439  NA 136 137  --   --  135  --   K 3.5 3.7  --   --  3.6  --   CL 99 102  --   --  101  --   CO2 20* 25  --   --  23  --   GLUCOSE 196* 119*  --   --  100*  --   BUN 12 14  --   --  15  --   CREATININE 0.95 0.88  --   --  0.95 1.02*  CALCIUM 8.3* 7.8*  --   --  8.0*  --   MG  --   --  1.6* 2.2  --   --   PHOS  --   --  3.9  --   --   --    GFR: Estimated Creatinine Clearance: 72.2 mL/min (A) (by C-G formula based on SCr of 1.02 mg/dL (H)). Liver Function Tests: Recent Labs  Lab 10/20/19 1010 10/21/19 0504 10/23/19 0448  AST 27 49* 31  ALT 11 12 11   ALKPHOS 213* 241* 248*  BILITOT 1.1 0.6 1.1  PROT 6.4* 6.2* 5.6*  ALBUMIN 1.7* 1.5* 1.5*   Recent Labs  Lab 10/20/19 1010  LIPASE 16   No results for input(s): AMMONIA in the last 168 hours. Coagulation Profile: Recent Labs  Lab 10/20/19 1010  INR 1.6*   Cardiac Enzymes: No results for input(s): CKTOTAL, CKMB, CKMBINDEX, TROPONINI in the last 168 hours. BNP (last 3 results) No results for input(s): PROBNP in the last 8760 hours. HbA1C: No results for input(s): HGBA1C in the last 72 hours. CBG: No results for input(s): GLUCAP in the last 168 hours. Lipid Profile: No results for input(s): CHOL, HDL, LDLCALC, TRIG, CHOLHDL, LDLDIRECT in the last 72 hours. Thyroid Function Tests: No results for input(s): TSH, T4TOTAL, FREET4, T3FREE, THYROIDAB in the last 72 hours. Anemia Panel: No results for input(s): VITAMINB12, FOLATE, FERRITIN, TIBC, IRON, RETICCTPCT in the last 72 hours. Urine analysis:    Component Value Date/Time   COLORURINE AMBER (A) 08/13/2019 0222   APPEARANCEUR  CLOUDY (A) 08/13/2019 0222   LABSPEC 1.012 08/13/2019 0222   PHURINE 5.0 08/13/2019 0222   GLUCOSEU 50 (A) 08/13/2019 0222   HGBUR SMALL (A) 08/13/2019 0222   BILIRUBINUR NEGATIVE 08/13/2019 0222   KETONESUR NEGATIVE 08/13/2019 0222   PROTEINUR 100 (A) 08/13/2019 0222   NITRITE NEGATIVE 08/13/2019 0222   LEUKOCYTESUR NEGATIVE 08/13/2019 0222   Sepsis Labs: @LABRCNTIP (procalcitonin:4,lacticidven:4)  ) Recent Results (from the past 240 hour(s))  Respiratory Panel by RT PCR (Flu A&B, Covid) - Nasopharyngeal Swab     Status: None   Collection Time: 10/20/19  10:10 AM   Specimen: Nasopharyngeal Swab  Result Value Ref Range Status   SARS Coronavirus 2 by RT PCR NEGATIVE NEGATIVE Final    Comment: (NOTE) SARS-CoV-2 target nucleic acids are NOT DETECTED. The SARS-CoV-2 RNA is generally detectable in upper respiratoy specimens during the acute phase of infection. The lowest concentration of SARS-CoV-2 viral copies this assay can detect is 131 copies/mL. A negative result does not preclude SARS-Cov-2 infection and should not be used as the sole basis for treatment or other patient management decisions. A negative result may occur with  improper specimen collection/handling, submission of specimen other than nasopharyngeal swab, presence of viral mutation(s) within the areas targeted by this assay, and inadequate number of viral copies (<131 copies/mL). A negative result must be combined with clinical observations, patient history, and epidemiological information. The expected result is Negative. Fact Sheet for Patients:  PinkCheek.be Fact Sheet for Healthcare Providers:  GravelBags.it This test is not yet ap proved or cleared by the [place] FDA and  has been authorized for detection and/or diagnosis of SARS-CoV-2 by FDA under an Emergency Use Authorization (EUA). This EUA will remain  in effect (meaning this test can be  used) for the duration of the COVID-19 declaration under Section 564(b)(1) of the Act, 21 U.S.C. section 360bbb-3(b)(1), unless the authorization is terminated or revoked sooner.    Influenza A by PCR NEGATIVE NEGATIVE Final   Influenza B by PCR NEGATIVE NEGATIVE Final    Comment: (NOTE) The Xpert Xpress SARS-CoV-2/FLU/RSV assay is intended as an aid in  the diagnosis of influenza from Nasopharyngeal swab specimens and  should not be used as a sole basis for treatment. Nasal washings and  aspirates are unacceptable for Xpert Xpress SARS-CoV-2/FLU/RSV  testing. Fact Sheet for Patients: PinkCheek.be Fact Sheet for Healthcare Providers: GravelBags.it This test is not yet approved or cleared by the [place] FDA and  has been authorized for detection and/or diagnosis of SARS-CoV-2 by  FDA under an Emergency Use Authorization (EUA). This EUA will remain  in effect (meaning this test can be used) for the duration of the  Covid-19 declaration under Section 564(b)(1) of the Act, 21  U.S.C. section 360bbb-3(b)(1), unless the authorization is  terminated or revoked. Performed at [facility], [place] [address] [place], [place] [address]   Culture, blood (routine x 2)     Status: None (Preliminary result)   Collection Time: 10/21/19 11:38 AM   Specimen: BLOOD  Result Value Ref Range Status   Specimen Description   Final    BLOOD RIGHT ARM Performed at [place] [address], [place], [place] [address]    Special Requests   Final    BOTTLES DRAWN AEROBIC AND ANAEROBIC Blood Culture adequate volume Performed at [place] [address], [place], [place] [address]    Culture   Final    NO GROWTH 3 DAYS Performed at [facility], [place] [address], [place], [place] [address]    Report Status PENDING  Incomplete  Culture, blood (routine x 2)     Status: None  (Preliminary result)   Collection Time: 10/21/19 11:38 AM   Specimen: BLOOD  Result Value Ref Range Status   Specimen Description   Final    BLOOD LEFT ARM Performed at [place] [address] [place],  [address]    Special Requests   Final    BOTTLES DRAWN AEROBIC ONLY Blood Culture adequate volume Performed  at [facility], [place] [address] [place], [place] [address]    Culture   Final    NO GROWTH 3 DAYS Performed at [facility], [place] [address] [place], [place] [address]    Report Status PENDING  Incomplete  MRSA PCR Screening     Status: None   Collection Time: 10/22/19  6:04 PM   Specimen: Nasal Mucosa; Nasopharyngeal  Result Value Ref Range Status   MRSA by PCR NEGATIVE NEGATIVE Final    Comment:        The GeneXpert MRSA Assay (FDA approved for NASAL specimens only), is one component of a comprehensive MRSA colonization surveillance program. It is not intended to diagnose MRSA infection nor to guide or monitor treatment for MRSA infections. Performed at [facility], [place] [address], [place], [place] [address]   Culture, Urine     Status: None   Collection Time: 10/23/19 12:23 PM   Specimen: Urine, Clean Catch  Result Value Ref Range Status   Specimen Description   Final    URINE, CLEAN CATCH Performed at [facility], [place] [address] [place], [place] [address]    Special Requests   Final    Immunocompromised Performed at [facility], [place] [address] [place], [place] [address]    Culture   Final    NO GROWTH Performed at [facility], [place] [address], [place], [place] [address]    Report Status 10/24/2019 FINAL  Final      Studies: No results found.  Scheduled Meds: . amiodarone  100 mg Oral QHS  . Chlorhexidine Gluconate Cloth  6 each Topical Daily  . diltiazem  240 mg Oral QHS  . ferrous gluconate  324 mg Oral TID WC  .  metoprolol succinate  25 mg Oral QHS  . rivaroxaban  20 mg Oral Q supper  . sodium chloride flush  10-40 mL Intracatheter Q12H  . sodium chloride flush  3 mL Intravenous Q12H    Continuous Infusions: . sodium chloride    . piperacillin-tazobactam (ZOSYN)  IV 3.375 g (10/24/19 0958)  . vancomycin 1,750 mg (10/23/19 2232)     LOS: 4 days     Kayleen Memos, MD [facility] Pager [phone]  If 7PM-7AM, please contact night-coverage [email or web address] Password [facility] 10/24/2019, 12:43 PM

## 2019-10-24 NOTE — NC FL2 (Signed)
[place] LEVEL OF CARE SCREENING TOOL     IDENTIFICATION  Patient Name: Amber Medina Birthdate: 10-Feb-1951 Sex: female Admission Date (Current Location): 10/20/2019  [facility] and [place] Number:  Herbalist and Address:  [facility],  [place] [address] [place], [place]      Provider Number: [id number]  Attending Physician Name and Address:  Kayleen Memos, DO  Relative Name and Phone Number:       Current Level of Care: Hospital Recommended Level of Care: [place] Prior Approval Number:    Date Approved/Denied:   PASRR Number: [id number]  Discharge Plan: SNF    Current Diagnoses: Patient Active Problem List   Diagnosis Date Noted  . Near syncope 10/20/2019  . Leukocytosis 10/20/2019  . Colon cancer metastasized to liver ([place]) 08/17/2019  . Goals of care, counseling/discussion 08/17/2019  . Liver lesion 08/13/2019  . Abdominal distension 08/13/2019  . Acute pulmonary embolism with acute cor pulmonale ([place]) 02/05/2018  . Adenocarcinoma of colon ([place]) 02/05/2018  . Colonic mass 06/15/2017  . Anemia 06/11/2017  . Chronic diastolic (congestive) heart failure ([place]) 10/11/2015  . Persistent atrial fibrillation ([place]) 10/11/2015  . OSA (obstructive sleep apnea)   . Acute respiratory failure with hypoxia ([place])   . Hyperglycemia 09/06/2015  . Anasarca 09/06/2015  . Obesity, morbid ([place]) 09/06/2015    Orientation RESPIRATION BLADDER Height & Weight     Self, Time, Situation, Place  Normal External catheter Weight: 133.8 kg Height:  5\' 6"  (167.6 cm)  BEHAVIORAL SYMPTOMS/MOOD NEUROLOGICAL BOWEL NUTRITION STATUS  (none) (none) Continent Diet(see d/c summary)  AMBULATORY STATUS COMMUNICATION OF NEEDS Skin   Extensive Assist Verbally Normal                       Personal Care Assistance Level of Assistance  Bathing, Feeding, Dressing Bathing Assistance: Maximum assistance Feeding assistance: Independent Dressing  Assistance: Limited assistance     Functional Limitations Info  Sight, Hearing, Speech Sight Info: Adequate Hearing Info: Adequate Speech Info: Adequate    SPECIAL CARE FACTORS FREQUENCY  PT (By licensed PT)     PT Frequency: 5X/W              Contractures      Additional Factors Info  Code Status, Allergies Code Status Info: partial Allergies Info: Compazine, Zyrtec           Current Medications (10/24/2019):  This is the current hospital active medication list Current Facility-Administered Medications  Medication Dose Route Frequency Provider Last Rate Last Admin  . 0.9 %  sodium chloride infusion  250 mL Intravenous PRN Orma Flaming, MD      . acetaminophen (TYLENOL) tablet 650 mg  650 mg Oral Q6H PRN Orma Flaming, MD       Or  . acetaminophen (TYLENOL) suppository 650 mg  650 mg Rectal Q6H PRN Orma Flaming, MD      . amiodarone (PACERONE) tablet 100 mg  100 mg Oral QHS Orma Flaming, MD   100 mg at 10/23/19 2229  . Chlorhexidine Gluconate Cloth 2 % PADS 6 each  6 each Topical Daily Barb Merino, MD   6 each at 10/24/19 [phone]  . diltiazem (CARDIZEM CD) 24 hr capsule 240 mg  240 mg Oral QHS Orma Flaming, MD   240 mg at 10/23/19 2228  . ferrous gluconate (FERGON) tablet 324 mg  324 mg Oral TID WC Orma Flaming, MD   324 mg at 10/24/19  1355  . metoprolol succinate (TOPROL-XL) 24 hr tablet 25 mg  25 mg Oral QHS Orma Flaming, MD   25 mg at 10/23/19 2228  . ondansetron (ZOFRAN) injection 4 mg  4 mg Intravenous Q6H PRN Irene Pap N, DO   4 mg at 10/23/19 1011  . piperacillin-tazobactam (ZOSYN) IVPB 3.375 g  3.375 g Intravenous Q8H Angela Adam, RPH 12.5 mL/hr at 10/24/19 0958 3.375 g at 10/24/19 0958  . rivaroxaban (XARELTO) tablet 20 mg  20 mg Oral Q supper Orma Flaming, MD   20 mg at 10/23/19 1726  . sodium chloride flush (NS) 0.9 % injection 10-40 mL  10-40 mL Intracatheter Q12H Barb Merino, MD   10 mL at 10/23/19 0958  . sodium chloride flush  (NS) 0.9 % injection 10-40 mL  10-40 mL Intracatheter PRN Barb Merino, MD      . sodium chloride flush (NS) 0.9 % injection 3 mL  3 mL Intravenous Q12H Orma Flaming, MD   3 mL at 10/23/19 0958  . sodium chloride flush (NS) 0.9 % injection 3 mL  3 mL Intravenous PRN Orma Flaming, MD      . vancomycin Alcus Dad) IVPB 1750 mg/350 mL  1,750 mg Intravenous Q24H Angela Adam, [facility] 175 mL/hr at 10/23/19 2232 1,750 mg at 10/23/19 2232     Discharge Medications: Please see discharge summary for a list of discharge medications.  Relevant Imaging Results:  Relevant Lab Results:   Additional Information SS# [id number]  [place], [place]

## 2019-10-24 NOTE — Progress Notes (Addendum)
HEMATOLOGY-ONCOLOGY PROGRESS NOTE  SUBJECTIVE: Dizziness has resolved.  She is not complaining of any chest pain or shortness of breath.  She has abdominal discomfort particularly when she tries to move in the bed.  Oncology History  Colon cancer metastasized to liver [facility])  08/17/2019 Initial Diagnosis   Colon cancer metastasized to liver ([place])   08/18/2019 -  Chemotherapy   The patient had palonosetron (ALOXI) injection 0.25 mg, 0.25 mg, Intravenous,  Once, 4 of 12 cycles Administration: 0.25 mg (08/18/2019), 0.25 mg (08/30/2019), 0.25 mg (09/13/2019), 0.25 mg (09/27/2019) fluorouracil (ADRUCIL) 5,200 mg in sodium chloride 0.9 % 1,000 mL chemo infusion, 2,000 mg/m2 = 5,200 mg (83.3 % of original dose 2,400 mg/m2), Intravenous, Once, 1 of 1 cycle Dose modification: 2,000 mg/m2 (original dose 2,400 mg/m2, Cycle 1, Reason: Provider Judgment) Administration: 5,200 mg (08/18/2019) leucovorin 778 mg in dextrose 5 % 250 mL infusion, 300 mg/m2 = 778 mg (100 % of original dose 300 mg/m2), Intravenous,  Once, 4 of 12 cycles Dose modification: 300 mg/m2 (original dose 300 mg/m2, Cycle 1, Reason: Provider Judgment) Administration: 778 mg (08/18/2019), 778 mg (08/30/2019), 778 mg (09/13/2019), 778 mg (09/27/2019) oxaliplatin (ELOXATIN) 170 mg in dextrose 5 % 500 mL chemo infusion, 65 mg/m2 = 170 mg (100 % of original dose 65 mg/m2), Intravenous,  Once, 4 of 12 cycles Dose modification: 65 mg/m2 (original dose 65 mg/m2, Cycle 1, Reason: Provider Judgment) Administration: 170 mg (08/18/2019), 170 mg (08/30/2019), 170 mg (09/13/2019), 170 mg (09/27/2019) fluorouracil (ADRUCIL) 4,150 mg in sodium chloride 0.9 % 67 mL chemo infusion, 1,600 mg/m2 = 4,150 mg (80 % of original dose 2,000 mg/m2), Intravenous, 1 Day/Dose, 3 of 11 cycles Dose modification: 2,000 mg/m2 (original dose 2,000 mg/m2, Cycle 2, Reason: Provider Judgment), 1,600 mg/m2 (original dose 2,000 mg/m2, Cycle 2, Reason: Provider  Judgment) Administration: 4,150 mg (08/30/2019), 4,150 mg (09/13/2019), 4,150 mg (09/27/2019)  for chemotherapy treatment.     PHYSICAL EXAMINATION:  Vitals:   10/23/19 2213 10/24/19 0613  BP: (!) 122/58 (!) 105/48  Pulse: 85 77  Resp: 19 20  Temp: 97.9 F (36.6 C) 98 F (36.7 C)  SpO2: 93% 93%   Filed Weights   10/20/19 0943 10/22/19 0606 10/23/19 0607  Weight: 270 lb (122.5 kg) 295 lb (133.8 kg) 295 lb (133.8 kg)    Intake/Output from previous day: 01/17 0701 - 01/18 0700 In: 2222.7 [P.O.:1417; IV Piggyback:805.7] Out: 1450 [Urine:1450]  GENERAL:alert, no distress and comfortable OROPHARYNX: No thrush or mucositis LUNGS: clear to auscultation and percussion with normal breathing effort HEART: regular rate & rhythm, 2/6 systolic murmur ABDOMEN:abdomen soft, reports tenderness with light palpation particularly over the liver and normal bowel sounds Musculoskeletal:no cyanosis of digits and no clubbing  NEURO: alert & oriented x 3 with fluent speech, no focal motor/sensory deficits  Port-A-Cath without erythema or edema  LABORATORY DATA:  I have reviewed the data as listed CMP Latest Ref Rng & Units 10/24/2019 10/23/2019 10/21/2019  Glucose 70 - 99 mg/dL - 100(H) 119(H)  BUN 8 - 23 mg/dL - 15 14  Creatinine 0.44 - 1.00 mg/dL 1.02(H) 0.95 0.88  Sodium 135 - 145 mmol/L - 135 137  Potassium 3.5 - 5.1 mmol/L - 3.6 3.7  Chloride 98 - 111 mmol/L - 101 102  CO2 22 - 32 mmol/L - 23 25  Calcium 8.9 - 10.3 mg/dL - 8.0(L) 7.8(L)  Total Protein 6.5 - 8.1 g/dL - 5.6(L) 6.2(L)  Total Bilirubin 0.3 - 1.2 mg/dL - 1.1 0.6  Alkaline Phos  38 - 126 U/L - 248(H) 241(H)  AST 15 - 41 U/L - 31 49(H)  ALT 0 - 44 U/L - 11 12    Lab Results  Component Value Date   WBC 32.6 (H) 10/24/2019   HGB 7.9 (L) 10/24/2019   HCT 28.3 (L) 10/24/2019   MCV 98.3 10/24/2019   PLT 193 10/24/2019   NEUTROABS 34.4 (H) 10/23/2019    CT Angio Chest PE W and/or Wo Contrast  Result Date:  10/20/2019 CLINICAL DATA:  Dyspnea on exertion EXAM: CT ANGIOGRAPHY CHEST WITH CONTRAST TECHNIQUE: Multidetector CT imaging of the chest was performed using the standard protocol during bolus administration of intravenous contrast. Multiplanar CT image reconstructions and MIPs were obtained to evaluate the vascular anatomy. CONTRAST:  79m OMNIPAQUE IOHEXOL 350 MG/ML SOLN COMPARISON:  02/05/2018 and chest x-ray of the same date FINDINGS: Cardiovascular: Heart size is enlarged without pericardial effusion. Findings similar to the prior study. Aortic caliber 4 cm of the ascending thoracic aorta. No signs of pulmonary embolism. Mildly limited assessment of the lung bases due to respiratory motion. Mediastinum/Nodes: Right-sided central venous access device terminates in the right atrium. Pre pericardial lymph node measuring 1.5 cm new from previous exams. No additional signs of mediastinal adenopathy. Esophagus grossly normal. Mildly nodular thyroid similar to prior study. Lungs/Pleura: Small pulmonary nodules scattered throughout the right chest some within some without calcification. New pulmonary nodule right lung apex (image 20, series 6) 6 mm New right upper lobe pulmonary nodule (image 61, series 6) 6 mm New pulmonary nodule in the right lower lobe (image 84, series 6) 6 mm. Signs of basilar atelectasis on the left. No signs of pleural effusion Small left upper lobe pulmonary nodule, also appears new compared to the previous study. Upper Abdomen: Signs of hepatic metastatic disease not well evaluated. Suggestion of diffuse infiltration of the left hepatic lobe. Small amount perisplenic fluid has developed also since the study of 08/13/2019. Findings suggest interval worsening of disease in the liver though again this areas not well evaluated currently. Bulky celiac lymph node (image 88, series 4) 3.3 cm in short axis unchanged. Musculoskeletal: No destructive bone process. No acute bone finding. Review of the MIP  images confirms the above findings. IMPRESSION: 1. Negative for pulmonary embolism. 2. Interval development of multiple bilateral pulmonary nodules, noncalcified findings are suspicious for metastatic disease. 3. Interval development of a 1.5 cm pre pericardial lymph node (image 134, series 5), suspicious for metastatic disease. 4. Signs of hepatic metastatic disease not well evaluated currently, but appearing worse than on the prior study. 5. Small amount of perisplenic fluid has developed since the previous study. 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 Aortic Atherosclerosis (ICD10-I70.0). Electronically Signed   By: GZetta BillsM.D.   On: 10/20/2019 12:35   CT ABDOMEN PELVIS W CONTRAST  Result Date: 10/21/2019 CLINICAL DATA:  Metastatic colon cancer. Abdominal distension, weakness and diarrhea. EXAM: CT ABDOMEN AND PELVIS WITH CONTRAST TECHNIQUE: Multidetector CT imaging of the abdomen and pelvis was performed using the standard protocol following bolus administration of intravenous contrast. CONTRAST:  1060mOMNIPAQUE IOHEXOL 300 MG/ML  SOLN COMPARISON:  Chest CT from yesterday and CT abdomen/pelvis 08/13/2019 FINDINGS: Lower chest: A few small pulmonary nodules are noted and consistent with metastatic disease as demonstrated on yesterday's chest CT. Streaky left basilar atelectasis. No effusions or infiltrates. The heart is normal in size. No pericardial effusion. Hepatobiliary: Severe and markedly progressive hepatic metastatic disease. There  is now a large confluent mass lesion occupying the left hepatic lobe and measuring 14.5 x 12.0 cm on image 36/2. Numerous lesions are also noted throughout the right hepatic lobe and these have also enlarged. Exophytic tumor versus adjacent adenopathy near the left hepatic lobe on image number 28/2 measures 4 cm. This is unchanged in size. No biliary dilatation. Pancreas: No mass, inflammation or  ductal dilatation. Spleen: Normal size.  No focal lesions. Adrenals/Urinary Tract: Stable bilateral adrenal gland nodules. These are likely benign adenomas. The kidneys are unremarkable. No renal lesions or hydronephrosis. The bladder is normal. Stomach/Bowel: The stomach, duodenum and small bowel are unremarkable. No mass lesions or obstructive findings. Stable surgical changes from a right hemicolectomy. Colonic diverticulosis but no findings for obstructing mass. Vascular/Lymphatic: Moderate scattered atherosclerotic calcifications involving the aorta and iliac arteries. No aneurysm or dissection. The major venous structures are patent. Enlarging omental and peritoneal surface lesions. The largest omental lesion measures 3.9 cm on image 53/2. This previously measured 3.1 cm. Other smaller lesions have enlarged slightly also. No retroperitoneal lymphadenopathy. Reproductive: The uterus and ovaries are unremarkable. Other: No pelvic mass, pelvic adenopathy or free pelvic fluid collections. No inguinal mass or adenopathy. Musculoskeletal: No significant bony findings. IMPRESSION: 1. Significant progression of metastatic hepatic disease suggesting a very aggressive neoplasm. 2. Enlarging omental and peritoneal surface lesions. 3. Small pulmonary nodules consistent with pulmonary metastatic disease. Electronically Signed   By: Marijo Sanes M.D.   On: 10/21/2019 16:12   DG Chest Port 1 View  Result Date: 10/20/2019 CLINICAL DATA:  Dyspnea. EXAM: PORTABLE CHEST 1 VIEW COMPARISON:  08/13/2019 FINDINGS: The heart size and pulmonary vascularity are and the lungs clear. Aortic atherosclerosis. New power port in place. Tip is in superior vena cava just below the carina. No significant bone abnormality. IMPRESSION: 1. No active disease. 2.  Aortic Atherosclerosis (ICD10-I70.0). 3. Power port in good position. Electronically Signed   By: Lorriane Shire M.D.   On: 10/20/2019 10:26   ECHOCARDIOGRAM COMPLETE  Result  Date: 10/21/2019   ECHOCARDIOGRAM REPORT   Patient Name:   JAYDN Hinson  Date of Exam: 10/21/2019 Medical Rec #:  [id number]  Height:       66.0 in Accession #:    [id number] Weight:       270.0 lb Date of Birth:  07-05-1951  BSA:          2.27 m Patient Age:    17 years   BP:           131/55 mmHg Patient Gender: F          HR:           108 bpm. Exam Location:  Inpatient Procedure: 2D Echo Indications:    Syncope 780.2 / R55  History:        Patient has prior history of Echocardiogram examinations, most                 recent 02/06/2018. CHF. Colon cancer. Chronic kidney disease.                 Anemia.  Sonographer:    Darlina Sicilian RDCS Referring Phys: [id number] [place]  1. Left ventricular ejection fraction, by visual estimation, is 60 to 65%. The left ventricle has normal function. There is moderately increased left ventricular hypertrophy.  2. Left ventricular diastolic parameters are consistent with Grade II diastolic dysfunction (pseudonormalization).  3. Elevated left atrial pressure.  4. Global right ventricle has  normal systolic function.The right ventricular size is normal.  5. Left atrial size was normal.  6. Right atrial size was normal.  7. The mitral valve is normal in structure. No evidence of mitral valve regurgitation.  8. The tricuspid valve is normal in structure.  9. The aortic valve was not well visualized. Aortic valve regurgitation is mild. Mild to moderate aortic valve sclerosis/calcification without any evidence of aortic stenosis. 10. The pulmonic valve was not well visualized. Pulmonic valve regurgitation is not visualized. 11. The inferior vena cava is normal in size with <50% respiratory variability, suggesting right atrial pressure of 8 mmHg. 12. TR signal is inadequate for assessing pulmonary artery systolic pressure. 13. There is dilatation of the ascending aorta measuring 41 mm. FINDINGS  Left Ventricle: Left ventricular ejection fraction, by visual estimation, is 60 to  65%. The left ventricle has normal function. The left ventricle has no regional wall motion abnormalities. There is moderately increased left ventricular hypertrophy. Left ventricular diastolic parameters are consistent with Grade II diastolic dysfunction (pseudonormalization). Elevated left atrial pressure. Right Ventricle: The right ventricular size is normal. No increase in right ventricular wall thickness. Global RV systolic function is has normal systolic function. Left Atrium: Left atrial size was normal in size. Right Atrium: Right atrial size was normal in size Pericardium: Trivial pericardial effusion is present. Mitral Valve: The mitral valve is normal in structure. No evidence of mitral valve regurgitation. Tricuspid Valve: The tricuspid valve is normal in structure. Tricuspid valve regurgitation is not demonstrated. Aortic Valve: The aortic valve was not well visualized. Aortic valve regurgitation is mild. Aortic regurgitation PHT measures 465 msec. Mild to moderate aortic valve sclerosis/calcification is present, without any evidence of aortic stenosis. Pulmonic Valve: The pulmonic valve was not well visualized. Pulmonic valve regurgitation is not visualized. Pulmonic regurgitation is not visualized. Aorta: Aortic dilatation noted. There is dilatation of the ascending aorta measuring 41 mm. Venous: The inferior vena cava is normal in size with less than 50% respiratory variability, suggesting right atrial pressure of 8 mmHg. IAS/Shunts: The interatrial septum was not well visualized.  LEFT VENTRICLE PLAX 2D LVIDd:         3.70 cm  Diastology LVIDs:         2.30 cm  LV e' lateral:   5.66 cm/s LV PW:         1.30 cm  LV E/e' lateral: 17.2 LV IVS:        1.40 cm  LV e' medial:    5.77 cm/s LVOT diam:     2.00 cm  LV E/e' medial:  16.9 LV SV:         40 ml LV SV Index:   16.31 LVOT Area:     3.14 cm  RIGHT VENTRICLE RV S prime:     24.90 cm/s TAPSE (M-mode): 1.9 cm LEFT ATRIUM             Index       RIGHT  ATRIUM           Index LA diam:        4.40 cm 1.94 cm/m  RA Area:     13.00 cm LA Vol (A2C):   58.9 ml 25.93 ml/m RA Volume:   29.50 ml  12.99 ml/m LA Vol (A4C):   64.8 ml 28.52 ml/m LA Biplane Vol: 68.0 ml 29.93 ml/m  AORTIC VALVE LVOT Vmax:   108.00 cm/s LVOT Vmean:  73.600 cm/s LVOT VTI:    0.169 m  AI PHT:      465 msec  AORTA Ao Root diam: 3.10 cm Ao Asc diam:  4.10 cm MITRAL VALVE MV Area (PHT): 4.36 cm              SHUNTS MV PHT:        50.46 msec            Systemic VTI:  0.17 m MV Decel Time: 174 msec              Systemic Diam: 2.00 cm MV E velocity: 97.40 cm/s  103 cm/s MV A velocity: 106.00 cm/s 70.3 cm/s MV E/A ratio:  0.92        1.5  Oswaldo Milian MD Electronically signed by Oswaldo Milian MD Signature Date/Time: 10/21/2019/7:54:01 PM    Final     ASSESSMENT AND PLAN: 1. 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), right colectomy 04/22/2018  Ascending colon, moderately differentiated mucinous adenocarcinoma, no lymphovascular perineural invasion, no tumor deposits, tumor budding-low, 0/14 lymph nodes, tumor invades into the subserosa and is focally present at the inked serosal surface (pT4), no loss of mismatch repair protein expression  Colonoscopy 06/13/2017-ascending colon mass-adenocarcinoma, polyps in the ascending and sigmoid-tubular adenomas  CT abdomen/pelvis 08/13/2019-multiple peritoneal masses compatible with metastatic disease, multiple hypodense liver lesions consistent with metastatic disease  CEA 08/13/2019 -806.0  CT biopsy of a peritoneal mass 08/15/2019, metastatic adenocarcinoma consistent with colon cancer  MSS, tumor mutation burden 5, KRAS G12V ? Cycle 1 FOLFOX 08/18/2019  ? Cycle 2 FOLFOX 08/30/2019-5-FU dose reduced secondary to mucositis ? Cycle 3 FOLFOX 09/13/2019  CT of the abdomen pelvis on 10/21/2019-significant progression of metastatic hepatic disease suggesting a very aggressive neoplasm, enlarging omental and peritoneal surface lesions, small  pulmonary nodules consistent with pulmonary metastatic disease 2.Acute pulmonary embolism and left popliteal DVT 02/05/2018 3.Iron deficiency anemia September 2018 4.Atrial fibrillation 5.Congestive heart failure 6.Hypertension 7.Morbid obesity 8.Renal failure 9.  Hospital admission 10/20/2019-near syncope, anemia, leukocytosis  Ms. Shea appears stable.  Unfortunately, a restaging CT of the abdomen pelvis shows disease progression.  Results have been discussed with the patient.  Recommend changing her chemotherapy regimen to FOLFIRI and Avastin.  Hemoglobin is stable and she is no longer symptomatic from her anemia.  She has persistent leukocytosis but remains afebrile and has negative cultures to date.  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  Recommendations: 1.  CT scan results were discussed with the patient showed disease progression.  Recommend stopping her current chemotherapy regimen and switching to FOLFIRI/Avastin.  Adverse effects of been discussed with the patient including but not limited to mucositis, nausea and vomiting, diarrhea, GI perforation, wound healing delay as well as hypertension and proteinuria.  The patient is indicated that she would like to proceed. We will likely start this as an outpatient, but if hospital discharge is delayed, will consider starting the first cycle while she is inpatient. 2.  Monitor CBC closely.  Transfuse for hemoglobin less than 7.    LOS: 4 days   Mikey Bussing, DNP, AGPCNP-BC, AOCNP 10/24/19 Ms. Digiulio was interviewed and examined.  I reviewed the restaging CT images and discussed the findings with her.  She has clinical and radiologic evidence of progressive metastatic disease after FOLFOX chemotherapy.  She has not a candidate for an EGFR inhibitor secondary to a K-ras mutation.  We discussed comfort care versus a trial of second line systemic therapy.  She would like to continue treatment.  She understands no  therapy will  be curative.  I recommend FOLFIRI/Avastin.  I reviewed potential toxicities associated with this regimen including the chance of alopecia, hematologic toxicity, and diarrhea.  We reviewed the bleeding, hypertension, thromboembolic disease, nephrotoxicity, bowel perforation, delayed wound healing, and CNS toxicity seen with bevacizumab.  She agrees to proceed.  She will need skilled nursing facility placement at discharge.  The plan is to transfer her to the inpatient oncology with the plan to begin FOLFIRI/Avastin within the next few days.  The leukocytosis is likely secondary to progression of the cancer.  No evidence for a systemic infection.

## 2019-10-25 ENCOUNTER — Other Ambulatory Visit: Payer: Self-pay | Admitting: Oncology

## 2019-10-25 LAB — CBC WITH DIFFERENTIAL/PLATELET
Abs Immature Granulocytes: 0.41 10*3/uL — ABNORMAL HIGH (ref 0.00–0.07)
Basophils Absolute: 0.1 10*3/uL (ref 0.0–0.1)
Basophils Relative: 0 %
Eosinophils Absolute: 0.1 10*3/uL (ref 0.0–0.5)
Eosinophils Relative: 0 %
HCT: 28.3 % — ABNORMAL LOW (ref 36.0–46.0)
Hemoglobin: 8.1 g/dL — ABNORMAL LOW (ref 12.0–15.0)
Immature Granulocytes: 2 %
Lymphocytes Relative: 5 %
Lymphs Abs: 1.2 10*3/uL (ref 0.7–4.0)
MCH: 28.1 pg (ref 26.0–34.0)
MCHC: 28.6 g/dL — ABNORMAL LOW (ref 30.0–36.0)
MCV: 98.3 fL (ref 80.0–100.0)
Monocytes Absolute: 1.8 10*3/uL — ABNORMAL HIGH (ref 0.1–1.0)
Monocytes Relative: 7 %
Neutro Abs: 20.8 10*3/uL — ABNORMAL HIGH (ref 1.7–7.7)
Neutrophils Relative %: 86 %
Platelets: 156 10*3/uL (ref 150–400)
RBC: 2.88 MIL/uL — ABNORMAL LOW (ref 3.87–5.11)
RDW: 19.6 % — ABNORMAL HIGH (ref 11.5–15.5)
WBC: 24.4 10*3/uL — ABNORMAL HIGH (ref 4.0–10.5)
nRBC: 0 % (ref 0.0–0.2)

## 2019-10-25 LAB — COMPREHENSIVE METABOLIC PANEL
ALT: 13 U/L (ref 0–44)
AST: 29 U/L (ref 15–41)
Albumin: 1.4 g/dL — ABNORMAL LOW (ref 3.5–5.0)
Alkaline Phosphatase: 296 U/L — ABNORMAL HIGH (ref 38–126)
Anion gap: 9 (ref 5–15)
BUN: 16 mg/dL (ref 8–23)
CO2: 24 mmol/L (ref 22–32)
Calcium: 8.1 mg/dL — ABNORMAL LOW (ref 8.9–10.3)
Chloride: 103 mmol/L (ref 98–111)
Creatinine, Ser: 0.98 mg/dL (ref 0.44–1.00)
GFR calc Af Amer: >60 mL/min (ref >60)
GFR calc non Af Amer: 58 mL/min — ABNORMAL LOW (ref >60)
Glucose, Bld: 131 mg/dL — ABNORMAL HIGH (ref 70–99)
Potassium: 3.2 mmol/L — ABNORMAL LOW (ref 3.5–5.1)
Sodium: 136 mmol/L (ref 135–145)
Total Bilirubin: 1 mg/dL (ref 0.3–1.2)
Total Protein: 5.7 g/dL — ABNORMAL LOW (ref 6.5–8.1)

## 2019-10-25 LAB — PROCALCITONIN: Procalcitonin: 2.67 ng/mL

## 2019-10-25 NOTE — Progress Notes (Signed)
START ON PATHWAY REGIMEN - Colorectal     A cycle is every 14 days:     Bevacizumab-xxxx      Irinotecan      Leucovorin      Fluorouracil      Fluorouracil   **Always confirm dose/schedule in your pharmacy ordering system**  Patient Characteristics: Distant Metastases, Nonsurgical Candidate, KRAS/NRAS Mutation Positive/Unknown (BRAF V600 Wild-Type/Unknown), Standard Cytotoxic Therapy, Second Line Standard Cytotoxic Therapy, Bevacizumab Eligible Tumor Location: Colon Therapeutic Status: Distant Metastases Microsatellite/Mismatch Repair Status: MSS/pMMR BRAF Mutation Status: Wild-Type (no mutation) KRAS/NRAS Mutation Status: Mutation Positive Standard Cytotoxic Line of Therapy: Second Line Standard Cytotoxic Therapy Bevacizumab Eligibility: Eligible Intent of Therapy: Non-Curative / Palliative Intent, Discussed with Patient 

## 2019-10-25 NOTE — Care Management Important Message (Signed)
Important Message  Patient Details IM Letter given to Marney Doctor RN Case Manager to present to the Patient Name: Amber Medina MRN: [id number] Date of Birth: 12-06-1950   Medicare Important Message Given:  Yes     Kerin Salen 10/25/2019, 11:31 AM

## 2019-10-25 NOTE — Progress Notes (Signed)
Physical Therapy Treatment Patient Details Name: Amber Medina MRN: [id number] DOB: [birth year] Today's Date: 10/25/2019    History of Present Illness 69 year old female with history of chronic A. fib, chronic diastolic dysfunction, anemia, metastatic colon cancer currently on chemotherapy presented to the emergency room with episode of feeling dizzy when getting out of bed.  Patient also has progressive deconditioning and weakness.  She is on her fifth cycle of chemo.    PT Comments    Pt with improved assist level this session, only requiring min assist for sitting up and rolling. PT initiated repeated sit to stands with use of stedy today, pt tolerating a total of 3 stands with max +2 assistance. Pt requires very increased time to mobilize, and is limited by fear of falling as well as lack of confidence. Pt successfully got to recliner with use of stedy, lift pad placed and RN informed that pt may need lift for back to bed if too tired to use stedy. PT to continue to progress mobility as tolerated.    Follow Up Recommendations  SNF     Equipment Recommendations  None recommended by PT    Recommendations for Other Services       Precautions / Restrictions Precautions Precautions: Fall Restrictions Weight Bearing Restrictions: No    Mobility  Bed Mobility Overal bed mobility: Needs Assistance Bed Mobility: Rolling;Sidelying to Sit Rolling: Min assist Sidelying to sit: Min assist;HOB elevated       General bed mobility comments: min assist for rolling to L x2 and R x1 for placement/removal of bed pan and preparation for sitting EOB, respectively. min assist for sidelying to sit for completion of trunk elevation and LE lifting and lowering over side of bed.  Transfers Overall transfer level: Needs assistance Equipment used: Ambulation equipment used Transfers: Sit to/from Stand Sit to Stand: Max assist;+2 physical assistance;+2 safety/equipment;From elevated surface          General transfer comment: max assist +2 for initial power up, hip extension, steadying, and slow eccentric lowering once over recliner. Verbal cuing for hand placement on stedy bar when rising, upright posture. Sit to stand x3, once from EOB and twice from stedy seat.  Ambulation/Gait             General Gait Details: NT   Stairs             Wheelchair Mobility    Modified Rankin (Stroke Patients Only)       Balance Overall balance assessment: Needs assistance Sitting-balance support: Feet supported;Bilateral upper extremity supported;Single extremity supported Sitting balance-Leahy Scale: Fair Sitting balance - Comments: sat EOB ~5 minutes to recover from bed mobility and prep for stand with stedy     Standing balance-Leahy Scale: Zero Standing balance comment: max +2 to stand, completely reliant on PT/RN and stedy                            Cognition Arousal/Alertness: Awake/alert Behavior During Therapy: WFL for tasks assessed/performed Overall Cognitive Status: Within Functional Limits for tasks assessed                                        Exercises      General Comments General comments (skin integrity, edema, etc.): DOE 3/4 with mobility      Pertinent Vitals/Pain Pain Assessment: Faces Faces Pain Scale: Hurts  even more Pain Location: abdomen, L foot during sock donning Pain Descriptors / Indicators: Sore;Discomfort;Grimacing Pain Intervention(s): Limited activity within patient's tolerance;Monitored during session;Repositioned    Home Living                      Prior Function            PT Goals (current goals can now be found in the care plan section) Acute Rehab PT Goals Patient Stated Goal: get moving again PT Goal Formulation: With patient Time For Goal Achievement: 11/05/19 Potential to Achieve Goals: Good Progress towards PT goals: Progressing toward goals    Frequency    Min  2X/week      PT Plan Current plan remains appropriate    Co-evaluation              AM-PAC PT "6 Clicks" Mobility   Outcome Measure  Help needed turning from your back to your side while in a flat bed without using bedrails?: A Little Help needed moving from lying on your back to sitting on the side of a flat bed without using bedrails?: A Little Help needed moving to and from a bed to a chair (including a wheelchair)?: Total Help needed standing up from a chair using your arms (e.g., wheelchair or bedside chair)?: Total Help needed to walk in hospital room?: Total Help needed climbing 3-5 steps with a railing? : Total 6 Click Score: 10    End of Session Equipment Utilized During Treatment: Gait belt Activity Tolerance: Patient limited by pain;Patient limited by fatigue Patient left: with call bell/phone within reach;in chair;with chair alarm set;with nursing/sitter in room Nurse Communication: Mobility status PT Visit Diagnosis: Other abnormalities of gait and mobility (R26.89);Muscle weakness (generalized) (M62.81)     Time: [id number] PT Time Calculation (min) (ACUTE ONLY): 41 min  Charges:  $Therapeutic Activity: 23-37 mins                    Zakariyya Helfman E, PT Acute Rehabilitation Services Pager [phone]  Office [phone]  Fendi Meinhardt D Nakya Weyand 10/25/2019, 1:48 PM

## 2019-10-25 NOTE — Progress Notes (Addendum)
HEMATOLOGY-ONCOLOGY PROGRESS NOTE  SUBJECTIVE: Reports abdominal discomfort has improved.  She is not having any nausea or vomiting.  Reports loose stools, but cannot quantify how many times a day she is going.  She has no other complaints this morning.  Oncology History  Colon cancer metastasized to liver [facility])  08/17/2019 Initial Diagnosis   Colon cancer metastasized to liver ([place])   08/18/2019 -  Chemotherapy   The patient had palonosetron (ALOXI) injection 0.25 mg, 0.25 mg, Intravenous,  Once, 4 of 12 cycles Administration: 0.25 mg (08/18/2019), 0.25 mg (08/30/2019), 0.25 mg (09/13/2019), 0.25 mg (09/27/2019) fluorouracil (ADRUCIL) 5,200 mg in sodium chloride 0.9 % 1,000 mL chemo infusion, 2,000 mg/m2 = 5,200 mg (83.3 % of original dose 2,400 mg/m2), Intravenous, Once, 1 of 1 cycle Dose modification: 2,000 mg/m2 (original dose 2,400 mg/m2, Cycle 1, Reason: Provider Judgment) Administration: 5,200 mg (08/18/2019) leucovorin 778 mg in dextrose 5 % 250 mL infusion, 300 mg/m2 = 778 mg (100 % of original dose 300 mg/m2), Intravenous,  Once, 4 of 12 cycles Dose modification: 300 mg/m2 (original dose 300 mg/m2, Cycle 1, Reason: Provider Judgment) Administration: 778 mg (08/18/2019), 778 mg (08/30/2019), 778 mg (09/13/2019), 778 mg (09/27/2019) oxaliplatin (ELOXATIN) 170 mg in dextrose 5 % 500 mL chemo infusion, 65 mg/m2 = 170 mg (100 % of original dose 65 mg/m2), Intravenous,  Once, 4 of 12 cycles Dose modification: 65 mg/m2 (original dose 65 mg/m2, Cycle 1, Reason: Provider Judgment) Administration: 170 mg (08/18/2019), 170 mg (08/30/2019), 170 mg (09/13/2019), 170 mg (09/27/2019) fluorouracil (ADRUCIL) 4,150 mg in sodium chloride 0.9 % 67 mL chemo infusion, 1,600 mg/m2 = 4,150 mg (80 % of original dose 2,000 mg/m2), Intravenous, 1 Day/Dose, 3 of 11 cycles Dose modification: 2,000 mg/m2 (original dose 2,000 mg/m2, Cycle 2, Reason: Provider Judgment), 1,600 mg/m2 (original dose 2,000 mg/m2, Cycle 2,  Reason: Provider Judgment) Administration: 4,150 mg (08/30/2019), 4,150 mg (09/13/2019), 4,150 mg (09/27/2019)  for chemotherapy treatment.     PHYSICAL EXAMINATION:  Vitals:   10/25/19 0535 10/25/19 0549  BP: (!) 98/48 (!) 105/47  Pulse: 71   Resp:    Temp:    SpO2: 96%    Filed Weights   10/22/19 0606 10/23/19 0607 10/24/19 2100  Weight: 295 lb (133.8 kg) 295 lb (133.8 kg) 270 lb 1 oz (122.5 kg)    Intake/Output from previous day: 01/18 0701 - 01/19 0700 In: 3 [I.V.:3] Out: 1 [Stool:1]  GENERAL:alert, no distress and comfortable OROPHARYNX: No thrush or mucositis LUNGS: clear to auscultation and percussion with normal breathing effort HEART: regular rate & rhythm, 2/6 systolic murmur ABDOMEN:abdomen soft, reports tenderness with light palpation particularly over the liver and normal bowel sounds Musculoskeletal:no cyanosis of digits and no clubbing  NEURO: alert & oriented x 3 with fluent speech, no focal motor/sensory deficits  Port-A-Cath without erythema or edema  LABORATORY DATA:  I have reviewed the data as listed CMP Latest Ref Rng & Units 10/25/2019 10/24/2019 10/23/2019  Glucose 70 - 99 mg/dL 131(H) - 100(H)  BUN 8 - 23 mg/dL 16 - 15  Creatinine 0.44 - 1.00 mg/dL 0.98 1.02(H) 0.95  Sodium 135 - 145 mmol/L 136 - 135  Potassium 3.5 - 5.1 mmol/L 3.2(L) - 3.6  Chloride 98 - 111 mmol/L 103 - 101  CO2 22 - 32 mmol/L 24 - 23  Calcium 8.9 - 10.3 mg/dL 8.1(L) - 8.0(L)  Total Protein 6.5 - 8.1 g/dL 5.7(L) - 5.6(L)  Total Bilirubin 0.3 - 1.2 mg/dL 1.0 - 1.1  Alkaline  Phos 38 - 126 U/L 296(H) - 248(H)  AST 15 - 41 U/L 29 - 31  ALT 0 - 44 U/L 13 - 11    Lab Results  Component Value Date   WBC 24.4 (H) 10/25/2019   HGB 8.1 (L) 10/25/2019   HCT 28.3 (L) 10/25/2019   MCV 98.3 10/25/2019   PLT 156 10/25/2019   NEUTROABS 20.8 (H) 10/25/2019    CT Angio Chest PE W and/or Wo Contrast  Result Date: 10/20/2019 CLINICAL DATA:  Dyspnea on exertion EXAM: CT ANGIOGRAPHY  CHEST WITH CONTRAST TECHNIQUE: Multidetector CT imaging of the chest was performed using the standard protocol during bolus administration of intravenous contrast. Multiplanar CT image reconstructions and MIPs were obtained to evaluate the vascular anatomy. CONTRAST:  97m OMNIPAQUE IOHEXOL 350 MG/ML SOLN COMPARISON:  02/05/2018 and chest x-ray of the same date FINDINGS: Cardiovascular: Heart size is enlarged without pericardial effusion. Findings similar to the prior study. Aortic caliber 4 cm of the ascending thoracic aorta. No signs of pulmonary embolism. Mildly limited assessment of the lung bases due to respiratory motion. Mediastinum/Nodes: Right-sided central venous access device terminates in the right atrium. Pre pericardial lymph node measuring 1.5 cm new from previous exams. No additional signs of mediastinal adenopathy. Esophagus grossly normal. Mildly nodular thyroid similar to prior study. Lungs/Pleura: Small pulmonary nodules scattered throughout the right chest some within some without calcification. New pulmonary nodule right lung apex (image 20, series 6) 6 mm New right upper lobe pulmonary nodule (image 61, series 6) 6 mm New pulmonary nodule in the right lower lobe (image 84, series 6) 6 mm. Signs of basilar atelectasis on the left. No signs of pleural effusion Small left upper lobe pulmonary nodule, also appears new compared to the previous study. Upper Abdomen: Signs of hepatic metastatic disease not well evaluated. Suggestion of diffuse infiltration of the left hepatic lobe. Small amount perisplenic fluid has developed also since the study of 08/13/2019. Findings suggest interval worsening of disease in the liver though again this areas not well evaluated currently. Bulky celiac lymph node (image 88, series 4) 3.3 cm in short axis unchanged. Musculoskeletal: No destructive bone process. No acute bone finding. Review of the MIP images confirms the above findings. IMPRESSION: 1. Negative for  pulmonary embolism. 2. Interval development of multiple bilateral pulmonary nodules, noncalcified findings are suspicious for metastatic disease. 3. Interval development of a 1.5 cm pre pericardial lymph node (image 134, series 5), suspicious for metastatic disease. 4. Signs of hepatic metastatic disease not well evaluated currently, but appearing worse than on the prior study. 5. Small amount of perisplenic fluid has developed since the previous study. 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 Aortic Atherosclerosis (ICD10-I70.0). Electronically Signed   By: GZetta BillsM.D.   On: 10/20/2019 12:35   CT ABDOMEN PELVIS W CONTRAST  Result Date: 10/21/2019 CLINICAL DATA:  Metastatic colon cancer. Abdominal distension, weakness and diarrhea. EXAM: CT ABDOMEN AND PELVIS WITH CONTRAST TECHNIQUE: Multidetector CT imaging of the abdomen and pelvis was performed using the standard protocol following bolus administration of intravenous contrast. CONTRAST:  1022mOMNIPAQUE IOHEXOL 300 MG/ML  SOLN COMPARISON:  Chest CT from yesterday and CT abdomen/pelvis 08/13/2019 FINDINGS: Lower chest: A few small pulmonary nodules are noted and consistent with metastatic disease as demonstrated on yesterday's chest CT. Streaky left basilar atelectasis. No effusions or infiltrates. The heart is normal in size. No pericardial effusion. Hepatobiliary: Severe and markedly progressive hepatic metastatic disease.  There is now a large confluent mass lesion occupying the left hepatic lobe and measuring 14.5 x 12.0 cm on image 36/2. Numerous lesions are also noted throughout the right hepatic lobe and these have also enlarged. Exophytic tumor versus adjacent adenopathy near the left hepatic lobe on image number 28/2 measures 4 cm. This is unchanged in size. No biliary dilatation. Pancreas: No mass, inflammation or ductal dilatation. Spleen: Normal size.  No focal lesions.  Adrenals/Urinary Tract: Stable bilateral adrenal gland nodules. These are likely benign adenomas. The kidneys are unremarkable. No renal lesions or hydronephrosis. The bladder is normal. Stomach/Bowel: The stomach, duodenum and small bowel are unremarkable. No mass lesions or obstructive findings. Stable surgical changes from a right hemicolectomy. Colonic diverticulosis but no findings for obstructing mass. Vascular/Lymphatic: Moderate scattered atherosclerotic calcifications involving the aorta and iliac arteries. No aneurysm or dissection. The major venous structures are patent. Enlarging omental and peritoneal surface lesions. The largest omental lesion measures 3.9 cm on image 53/2. This previously measured 3.1 cm. Other smaller lesions have enlarged slightly also. No retroperitoneal lymphadenopathy. Reproductive: The uterus and ovaries are unremarkable. Other: No pelvic mass, pelvic adenopathy or free pelvic fluid collections. No inguinal mass or adenopathy. Musculoskeletal: No significant bony findings. IMPRESSION: 1. Significant progression of metastatic hepatic disease suggesting a very aggressive neoplasm. 2. Enlarging omental and peritoneal surface lesions. 3. Small pulmonary nodules consistent with pulmonary metastatic disease. Electronically Signed   By: Marijo Sanes M.D.   On: 10/21/2019 16:12   DG Chest Port 1 View  Result Date: 10/20/2019 CLINICAL DATA:  Dyspnea. EXAM: PORTABLE CHEST 1 VIEW COMPARISON:  08/13/2019 FINDINGS: The heart size and pulmonary vascularity are and the lungs clear. Aortic atherosclerosis. New power port in place. Tip is in superior vena cava just below the carina. No significant bone abnormality. IMPRESSION: 1. No active disease. 2.  Aortic Atherosclerosis (ICD10-I70.0). 3. Power port in good position. Electronically Signed   By: Lorriane Shire M.D.   On: 10/20/2019 10:26   ECHOCARDIOGRAM COMPLETE  Result Date: 10/21/2019   ECHOCARDIOGRAM REPORT   Patient Name:   Amber Medina  Date of Exam: 10/21/2019 Medical Rec #:  [id number]  Height:       66.0 in Accession #:    [id number] Weight:       270.0 lb Date of Birth:  [birth year]  BSA:          2.27 m Patient Age:    58 years   BP:           131/55 mmHg Patient Gender: F          HR:           108 bpm. Exam Location:  Inpatient Procedure: 2D Echo Indications:    Syncope 780.2 / R55  History:        Patient has prior history of Echocardiogram examinations, most                 recent 02/06/2018. CHF. Colon cancer. Chronic kidney disease.                 Anemia.  Sonographer:    Darlina Sicilian RDCS Referring Phys: [id number] [place]  1. Left ventricular ejection fraction, by visual estimation, is 60 to 65%. The left ventricle has normal function. There is moderately increased left ventricular hypertrophy.  2. Left ventricular diastolic parameters are consistent with Grade II diastolic dysfunction (pseudonormalization).  3. Elevated left atrial pressure.  4. Global right ventricle  has normal systolic function.The right ventricular size is normal.  5. Left atrial size was normal.  6. Right atrial size was normal.  7. The mitral valve is normal in structure. No evidence of mitral valve regurgitation.  8. The tricuspid valve is normal in structure.  9. The aortic valve was not well visualized. Aortic valve regurgitation is mild. Mild to moderate aortic valve sclerosis/calcification without any evidence of aortic stenosis. 10. The pulmonic valve was not well visualized. Pulmonic valve regurgitation is not visualized. 11. The inferior vena cava is normal in size with <50% respiratory variability, suggesting right atrial pressure of 8 mmHg. 12. TR signal is inadequate for assessing pulmonary artery systolic pressure. 13. There is dilatation of the ascending aorta measuring 41 mm. FINDINGS  Left Ventricle: Left ventricular ejection fraction, by visual estimation, is 60 to 65%. The left ventricle has normal function. The left ventricle  has no regional wall motion abnormalities. There is moderately increased left ventricular hypertrophy. Left ventricular diastolic parameters are consistent with Grade II diastolic dysfunction (pseudonormalization). Elevated left atrial pressure. Right Ventricle: The right ventricular size is normal. No increase in right ventricular wall thickness. Global RV systolic function is has normal systolic function. Left Atrium: Left atrial size was normal in size. Right Atrium: Right atrial size was normal in size Pericardium: Trivial pericardial effusion is present. Mitral Valve: The mitral valve is normal in structure. No evidence of mitral valve regurgitation. Tricuspid Valve: The tricuspid valve is normal in structure. Tricuspid valve regurgitation is not demonstrated. Aortic Valve: The aortic valve was not well visualized. Aortic valve regurgitation is mild. Aortic regurgitation PHT measures 465 msec. Mild to moderate aortic valve sclerosis/calcification is present, without any evidence of aortic stenosis. Pulmonic Valve: The pulmonic valve was not well visualized. Pulmonic valve regurgitation is not visualized. Pulmonic regurgitation is not visualized. Aorta: Aortic dilatation noted. There is dilatation of the ascending aorta measuring 41 mm. Venous: The inferior vena cava is normal in size with less than 50% respiratory variability, suggesting right atrial pressure of 8 mmHg. IAS/Shunts: The interatrial septum was not well visualized.  LEFT VENTRICLE PLAX 2D LVIDd:         3.70 cm  Diastology LVIDs:         2.30 cm  LV e' lateral:   5.66 cm/s LV PW:         1.30 cm  LV E/e' lateral: 17.2 LV IVS:        1.40 cm  LV e' medial:    5.77 cm/s LVOT diam:     2.00 cm  LV E/e' medial:  16.9 LV SV:         40 ml LV SV Index:   16.31 LVOT Area:     3.14 cm  RIGHT VENTRICLE RV S prime:     24.90 cm/s TAPSE (M-mode): 1.9 cm LEFT ATRIUM             Index       RIGHT ATRIUM           Index LA diam:        4.40 cm 1.94 cm/m  RA  Area:     13.00 cm LA Vol (A2C):   58.9 ml 25.93 ml/m RA Volume:   29.50 ml  12.99 ml/m LA Vol (A4C):   64.8 ml 28.52 ml/m LA Biplane Vol: 68.0 ml 29.93 ml/m  AORTIC VALVE LVOT Vmax:   108.00 cm/s LVOT Vmean:  73.600 cm/s LVOT VTI:    0.169  m AI PHT:      465 msec  AORTA Ao Root diam: 3.10 cm Ao Asc diam:  4.10 cm MITRAL VALVE MV Area (PHT): 4.36 cm              SHUNTS MV PHT:        50.46 msec            Systemic VTI:  0.17 m MV Decel Time: 174 msec              Systemic Diam: 2.00 cm MV E velocity: 97.40 cm/s  103 cm/s MV A velocity: 106.00 cm/s 70.3 cm/s MV E/A ratio:  0.92        1.5  Oswaldo Milian MD Electronically signed by Oswaldo Milian MD Signature Date/Time: 10/21/2019/7:54:01 PM    Final     ASSESSMENT AND PLAN: 1. 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), right colectomy 04/22/2018  Ascending colon, moderately differentiated mucinous adenocarcinoma, no lymphovascular perineural invasion, no tumor deposits, tumor budding-low, 0/14 lymph nodes, tumor invades into the subserosa and is focally present at the inked serosal surface (pT4), no loss of mismatch repair protein expression  Colonoscopy 06/13/2017-ascending colon mass-adenocarcinoma, polyps in the ascending and sigmoid-tubular adenomas  CT abdomen/pelvis 08/13/2019-multiple peritoneal masses compatible with metastatic disease, multiple hypodense liver lesions consistent with metastatic disease  CEA 08/13/2019 -806.0  CT biopsy of a peritoneal mass 08/15/2019, metastatic adenocarcinoma consistent with colon cancer  MSS, tumor mutation burden 5, KRAS G12V ? Cycle 1 FOLFOX 08/18/2019  ? Cycle 2 FOLFOX 08/30/2019-5-FU dose reduced secondary to mucositis ? Cycle 3 FOLFOX 09/13/2019  CT of the abdomen pelvis on 10/21/2019-significant progression of metastatic hepatic disease suggesting a very aggressive neoplasm, enlarging omental and peritoneal surface lesions, small pulmonary nodules consistent with pulmonary metastatic  disease 2.Acute pulmonary embolism and left popliteal DVT 02/05/2018 3.Iron deficiency anemia September 2018 4.Atrial fibrillation 5.Congestive heart failure 6.Hypertension 7.Morbid obesity 8.Renal failure 9.  Hospital admission 10/20/2019-near syncope, anemia, leukocytosis  Amber Medina appears stable.  Unfortunately, a restaging CT of the abdomen pelvis shows disease progression.  Results have been discussed with the patient.  Recommend changing her chemotherapy regimen to FOLFIRI and Avastin. Hemoglobin is stable and she is no longer symptomatic from her anemia.  She has persistent leukocytosis which is trending downward. 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  Recommendations: 1.  Recommend starting FOLFIRI/Avastin on 10/26/2019.  Adverse effects of this treatment have been discussed with the patient including but not limited to alopecia, hematologic toxicity, diarrhea, bleeding, hypertension, thromboembolic disease, nephrotoxicity, bowel perforation, and delayed wound healing, and CNS toxicity seen with bevacizumab.  The patient is indicated that she would like to proceed.  2.  Monitor CBC closely.  Transfuse for hemoglobin less than 7. 3.  SNF placement for rehabilitation.   LOS: 5 days   Mikey Bussing, DNP, AGPCNP-BC, AOCNP 10/25/19  Amber Medina was interviewed and examined.  The plan is to proceed with FOLFIRI/Avastin.  We again reviewed potential toxicities associated with this regimen.  She agrees to proceed.  She would like to begin therapy on 10/26/2019.

## 2019-10-25 NOTE — Progress Notes (Signed)
PROGRESS NOTE  Amber Medina OW:[id number] DOB: 1951-05-17 DOA: 10/20/2019 PCP: [name], MD  HPI/Recap of past 46 hours: 69 year old female with history of chronic A. fib, chronic diastolic CHF, anemia of chronic disease, metastatic colon cancer currently on chemotherapy presented to the emergency room with episode of feeling dizzy when getting out of bed.  Patient also has progressive deconditioning and weakness.  She is on her fifth cycle of chemo.  In the ED, CTA was negative for PE.  Due to elevated procalcitonin and lactic acid she was started on empiric IV abx on 10/22/19.  Cultures are negative to date.  No clear source of infection thus far but WBC, procalcitonin and lactic acid are trending down.  Completed empiric IV antibiotics.  Followed by oncology plan to restart chemotherapy.  10/25/19: Seen and examined.  Denies any chest pain or abdominal pain.  Left foot pain is improved.  She has no other complaints.     Assessment/Plan: Principal Problem:   Near syncope Active Problems:   Chronic diastolic (congestive) heart failure (HCC)   Persistent atrial fibrillation (HCC)   Colon cancer metastasized to liver (HCC)   Leukocytosis   Near syncope: Probably due to deconditioning.  In sinus rhythm.  Echocardiogram showed left ventricular hypertrophy moderately.  LVEF 60 to 65%.  Grade 2 diastolic dysfunction.  No recurrence while in the hospital.  Metastatic colon cancer: Clinical metastasis to liver, CT chest with evidence of metastasis.  Oncology following.  Appreciate assistance.  Will start FOLFIRI/Avastin on 10/2019  Ascending aorta aneurysm measuring 4.1 cm on 2D echo done on 10/19/2019. Maintain blood pressure normotensive Follow-up outpatient with repeat imaging  Paroxysmal A. fib: Currently sinus rhythm.  Rule out arrhythmias.  On beta-blockers, Cardizem and amiodarone and rate control.  On Xarelto for CVA prevention.  Leukocytosis/elevated procalcitonin and  elevated lactic acid:  1/16 Elevated procalcitonin up to 3.14, elevated lactic acid up to 2.7 Markers are trending down on IV antibiotics Dc IV vancomycin and continue Zosyn for total of 5 days.  MRSA negative. Started on IV antibiotics IV vancomycin and Zosyn empirically on 10/22/2019.   No clear sources of infection Cx negative to date  Deconditioning/physical debility:  PT recommending SNF.  Transition of care consulted to assist with SNF placement. Continue PT OT with assistance and fall precautions.  Goals of care Palliative care team following Appreciate assistance.   DVT prophylaxis: Xarelto Code Status: Partial Family Communication: None Disposition Plan: Pending clinical improvement.  Will plan to dc to SNF when oncology signs off.  Consultants:   Oncology  Palliative care team.  Procedures:   None  Antimicrobials:   None     Objective: Vitals:   10/25/19 0534 10/25/19 0535 10/25/19 0549 10/25/19 1309  BP: (!) 94/49 (!) 98/48 (!) 105/47 (!) 120/56  Pulse: 72 71  74  Resp: 18   19  Temp: (!) 97.4 F (36.3 C)   (!) 97.5 F (36.4 C)  TempSrc: Oral   Oral  SpO2: 95% 96%  98%  Weight:      Height:        Intake/Output Summary (Last 24 hours) at 10/25/2019 1808 Last data filed at 10/25/2019 1026 Gross per 24 hour  Intake 3 ml  Output 2 ml  Net 1 ml   Filed Weights   10/22/19 0606 10/23/19 0607 10/24/19 2100  Weight: 133.8 kg 133.8 kg 122.5 kg    Exam:  . General: 69 y.o. year-old female pleasant obese in no acute  distress.  Alert and oriented x3.   . Cardiovascular: Regular rate and rhythm no rub or gallop. Marland Kitchen Respiratory: Clear to auscultation no wheezes no rales. . Abdomen: Obese normal bowel sounds present.   . Musculoskeletal: No lower extremity edema bilaterally. Psychiatry: Mood is appropriate for condition and setting.  Data Reviewed: CBC: Recent Labs  Lab 10/20/19 1010 10/20/19 1010 10/21/19 0840 10/22/19 0448  10/23/19 0448 10/24/19 0913 10/25/19 0754  WBC 31.2*   < > 35.2* 36.0* 39.5* 32.6* 24.4*  NEUTROABS 26.2*  --  30.4* 31.3* 34.4*  --  20.8*  HGB 8.4*   < > 7.5* 8.2* 7.9* 7.9* 8.1*  HCT 29.8*   < > 26.3* 29.0* 27.8* 28.3* 28.3*  MCV 98.0   < > 96.7 95.7 96.2 98.3 98.3  PLT 237   < > 249 210 183 193 156   < > = values in this interval not displayed.   Basic Metabolic Panel: Recent Labs  Lab 10/20/19 1010 10/21/19 0504 10/21/19 0840 10/22/19 0842 10/23/19 0448 10/24/19 0439 10/25/19 0754  NA 136 137  --   --  135  --  136  K 3.5 3.7  --   --  3.6  --  3.2*  CL 99 102  --   --  101  --  103  CO2 20* 25  --   --  23  --  24  GLUCOSE 196* 119*  --   --  100*  --  131*  BUN 12 14  --   --  15  --  16  CREATININE 0.95 0.88  --   --  0.95 1.02* 0.98  CALCIUM 8.3* 7.8*  --   --  8.0*  --  8.1*  MG  --   --  1.6* 2.2  --   --   --   PHOS  --   --  3.9  --   --   --   --    GFR: Estimated Creatinine Clearance: 71.3 mL/min (by C-G formula based on SCr of 0.98 mg/dL). Liver Function Tests: Recent Labs  Lab 10/20/19 1010 10/21/19 0504 10/23/19 0448 10/25/19 0754  AST 27 49* 31 29  ALT 11 12 11 13   ALKPHOS 213* 241* 248* 296*  BILITOT 1.1 0.6 1.1 1.0  PROT 6.4* 6.2* 5.6* 5.7*  ALBUMIN 1.7* 1.5* 1.5* 1.4*   Recent Labs  Lab 10/20/19 1010  LIPASE 16   No results for input(s): AMMONIA in the last 168 hours. Coagulation Profile: Recent Labs  Lab 10/20/19 1010  INR 1.6*   Cardiac Enzymes: No results for input(s): CKTOTAL, CKMB, CKMBINDEX, TROPONINI in the last 168 hours. BNP (last 3 results) No results for input(s): PROBNP in the last 8760 hours. HbA1C: No results for input(s): HGBA1C in the last 72 hours. CBG: No results for input(s): GLUCAP in the last 168 hours. Lipid Profile: No results for input(s): CHOL, HDL, LDLCALC, TRIG, CHOLHDL, LDLDIRECT in the last 72 hours. Thyroid Function Tests: No results for input(s): TSH, T4TOTAL, FREET4, T3FREE, THYROIDAB in the  last 72 hours. Anemia Panel: No results for input(s): VITAMINB12, FOLATE, FERRITIN, TIBC, IRON, RETICCTPCT in the last 72 hours. Urine analysis:    Component Value Date/Time   COLORURINE AMBER (A) 08/13/2019 0222   APPEARANCEUR CLOUDY (A) 08/13/2019 0222   LABSPEC 1.012 08/13/2019 0222   PHURINE 5.0 08/13/2019 0222   GLUCOSEU 50 (A) 08/13/2019 0222   HGBUR SMALL (A) 08/13/2019 0222   BILIRUBINUR NEGATIVE 08/13/2019 0222  KETONESUR NEGATIVE 08/13/2019 0222   PROTEINUR 100 (A) 08/13/2019 0222   NITRITE NEGATIVE 08/13/2019 0222   LEUKOCYTESUR NEGATIVE 08/13/2019 0222   Sepsis Labs: @LABRCNTIP (procalcitonin:4,lacticidven:4)  ) Recent Results (from the past 240 hour(s))  Respiratory Panel by RT PCR (Flu A&B, Covid) - Nasopharyngeal Swab     Status: None   Collection Time: 10/20/19 10:10 AM   Specimen: Nasopharyngeal Swab  Result Value Ref Range Status   SARS Coronavirus 2 by RT PCR NEGATIVE NEGATIVE Final    Comment: (NOTE) SARS-CoV-2 target nucleic acids are NOT DETECTED. The SARS-CoV-2 RNA is generally detectable in upper respiratoy specimens during the acute phase of infection. The lowest concentration of SARS-CoV-2 viral copies this assay can detect is 131 copies/mL. A negative result does not preclude SARS-Cov-2 infection and should not be used as the sole basis for treatment or other patient management decisions. A negative result may occur with  improper specimen collection/handling, submission of specimen other than nasopharyngeal swab, presence of viral mutation(s) within the areas targeted by this assay, and inadequate number of viral copies (<131 copies/mL). A negative result must be combined with clinical observations, patient history, and epidemiological information. The expected result is Negative. Fact Sheet for Patients:  PinkCheek.be Fact Sheet for Healthcare Providers:  GravelBags.it This test is not  yet ap proved or cleared by the [place] FDA and  has been authorized for detection and/or diagnosis of SARS-CoV-2 by FDA under an Emergency Use Authorization (EUA). This EUA will remain  in effect (meaning this test can be used) for the duration of the COVID-19 declaration under Section 564(b)(1) of the Act, 21 U.S.C. section 360bbb-3(b)(1), unless the authorization is terminated or revoked sooner.    Influenza A by PCR NEGATIVE NEGATIVE Final   Influenza B by PCR NEGATIVE NEGATIVE Final    Comment: (NOTE) The Xpert Xpress SARS-CoV-2/FLU/RSV assay is intended as an aid in  the diagnosis of influenza from Nasopharyngeal swab specimens and  should not be used as a sole basis for treatment. Nasal washings and  aspirates are unacceptable for Xpert Xpress SARS-CoV-2/FLU/RSV  testing. Fact Sheet for Patients: PinkCheek.be Fact Sheet for Healthcare Providers: GravelBags.it This test is not yet approved or cleared by the [place] FDA and  has been authorized for detection and/or diagnosis of SARS-CoV-2 by  FDA under an Emergency Use Authorization (EUA). This EUA will remain  in effect (meaning this test can be used) for the duration of the  Covid-19 declaration under Section 564(b)(1) of the Act, 21  U.S.C. section 360bbb-3(b)(1), unless the authorization is  terminated or revoked. Performed at [facility], [place] [address] [place], [place] [address]   Culture, blood (routine x 2)     Status: None (Preliminary result)   Collection Time: 10/21/19 11:38 AM   Specimen: BLOOD  Result Value Ref Range Status   Specimen Description   Final    BLOOD RIGHT ARM Performed at [place] [address] [place], [place] [address]    Special Requests   Final    BOTTLES DRAWN AEROBIC AND ANAEROBIC Blood Culture adequate volume Performed at [place] [address], [place], [place] [address]    Culture   Final    NO GROWTH 4 DAYS Performed at [facility], [place] [address], [place], [place] [address]    Report Status PENDING  Incomplete  Culture, blood (routine x 2)     Status: None (Preliminary result)   Collection  Time: 10/21/19 11:38 AM   Specimen: BLOOD  Result Value Ref Range Status   Specimen Description   Final    BLOOD LEFT ARM Performed at [place] [address], [place], [place] [address]    Special Requests   Final    BOTTLES DRAWN AEROBIC ONLY Blood Culture adequate volume Performed at [place] [address] [place], [place] [address]    Culture   Final    NO GROWTH 4 DAYS Performed at [facility], [place] [address] [place], [place] [address]    Report Status PENDING  Incomplete  MRSA PCR Screening     Status: None   Collection Time: 10/22/19  6:04 PM   Specimen: Nasal Mucosa; Nasopharyngeal  Result Value Ref Range Status   MRSA by PCR NEGATIVE NEGATIVE Final    Comment:        The GeneXpert MRSA Assay (FDA approved for NASAL specimens only), is one component of a comprehensive MRSA colonization surveillance program. It is not intended to diagnose MRSA infection nor to guide or monitor treatment for MRSA infections. Performed at [facility], [place] [address] [place], [place] [address]   Culture, Urine     Status: None   Collection Time: 10/23/19 12:23 PM   Specimen: Urine, Clean Catch  Result Value Ref Range Status   Specimen Description   Final    URINE, CLEAN CATCH Performed at [facility], [place] [address], [place], [place] [address]    Special Requests   Final    Immunocompromised Performed at [facility], [place] [address], [place], [place] [address]    Culture   Final    NO GROWTH Performed at [facility], [place] [address] [place], [place] [address]    Report Status 10/24/2019 FINAL   Final      Studies: No results found.  Scheduled Meds: . amiodarone  100 mg Oral QHS  . Chlorhexidine Gluconate Cloth  6 each Topical Daily  . diltiazem  240 mg Oral QHS  . ferrous gluconate  324 mg Oral TID WC  . metoprolol succinate  25 mg Oral QHS  . rivaroxaban  20 mg Oral Q supper  . sodium chloride flush  10-40 mL Intracatheter Q12H  . sodium chloride flush  3 mL Intravenous Q12H    Continuous Infusions: . sodium chloride       LOS: 5 days     Kayleen Memos, MD [facility] Pager [phone]  If 7PM-7AM, please contact night-coverage [email or web address] Password [facility] 10/25/2019, 6:08 PM

## 2019-10-26 LAB — COMPREHENSIVE METABOLIC PANEL
ALT: 13 U/L (ref 0–44)
AST: 27 U/L (ref 15–41)
Albumin: 1.5 g/dL — ABNORMAL LOW (ref 3.5–5.0)
Alkaline Phosphatase: 266 U/L — ABNORMAL HIGH (ref 38–126)
Anion gap: 8 (ref 5–15)
BUN: 17 mg/dL (ref 8–23)
CO2: 24 mmol/L (ref 22–32)
Calcium: 8.3 mg/dL — ABNORMAL LOW (ref 8.9–10.3)
Chloride: 104 mmol/L (ref 98–111)
Creatinine, Ser: 0.89 mg/dL (ref 0.44–1.00)
GFR calc Af Amer: >60 mL/min (ref >60)
GFR calc non Af Amer: >60 mL/min (ref >60)
Glucose, Bld: 124 mg/dL — ABNORMAL HIGH (ref 70–99)
Potassium: 3.3 mmol/L — ABNORMAL LOW (ref 3.5–5.1)
Sodium: 136 mmol/L (ref 135–145)
Total Bilirubin: 1 mg/dL (ref 0.3–1.2)
Total Protein: 5.8 g/dL — ABNORMAL LOW (ref 6.5–8.1)

## 2019-10-26 LAB — CULTURE, BLOOD (ROUTINE X 2)
Culture: NO GROWTH
Culture: NO GROWTH
Special Requests: ADEQUATE
Special Requests: ADEQUATE

## 2019-10-26 LAB — CBC WITH DIFFERENTIAL/PLATELET
Abs Immature Granulocytes: 0.28 10*3/uL — ABNORMAL HIGH (ref 0.00–0.07)
Basophils Absolute: 0 10*3/uL (ref 0.0–0.1)
Basophils Relative: 0 %
Eosinophils Absolute: 0.2 10*3/uL (ref 0.0–0.5)
Eosinophils Relative: 1 %
HCT: 28.6 % — ABNORMAL LOW (ref 36.0–46.0)
Hemoglobin: 8.1 g/dL — ABNORMAL LOW (ref 12.0–15.0)
Immature Granulocytes: 1 %
Lymphocytes Relative: 5 %
Lymphs Abs: 1.1 10*3/uL (ref 0.7–4.0)
MCH: 27.7 pg (ref 26.0–34.0)
MCHC: 28.3 g/dL — ABNORMAL LOW (ref 30.0–36.0)
MCV: 97.9 fL (ref 80.0–100.0)
Monocytes Absolute: 1.8 10*3/uL — ABNORMAL HIGH (ref 0.1–1.0)
Monocytes Relative: 8 %
Neutro Abs: 18.3 10*3/uL — ABNORMAL HIGH (ref 1.7–7.7)
Neutrophils Relative %: 85 %
Platelets: 146 10*3/uL — ABNORMAL LOW (ref 150–400)
RBC: 2.92 MIL/uL — ABNORMAL LOW (ref 3.87–5.11)
RDW: 19.5 % — ABNORMAL HIGH (ref 11.5–15.5)
WBC: 21.6 10*3/uL — ABNORMAL HIGH (ref 4.0–10.5)
nRBC: 0 % (ref 0.0–0.2)

## 2019-10-26 MED ORDER — SODIUM CHLORIDE 0.9 % IV SOLN
300.0000 mg/m2 | Freq: Once | INTRAVENOUS | Status: AC
  Administered 2019-10-26: 13:00:00 718 mg via INTRAVENOUS
  Filled 2019-10-26: qty 35.9

## 2019-10-26 MED ORDER — SODIUM CHLORIDE 0.9 % IV SOLN
10.0000 mg | Freq: Once | INTRAVENOUS | Status: AC
  Administered 2019-10-26: 10 mg via INTRAVENOUS
  Filled 2019-10-26: qty 1

## 2019-10-26 MED ORDER — SODIUM CHLORIDE 0.9 % IV SOLN
1800.0000 mg/m2 | Freq: Once | INTRAVENOUS | Status: AC
  Administered 2019-10-26: 16:00:00 4300 mg via INTRAVENOUS
  Filled 2019-10-26: qty 86

## 2019-10-26 MED ORDER — SODIUM CHLORIDE 0.9 % IV SOLN: Freq: Once | INTRAVENOUS | Status: AC

## 2019-10-26 MED ORDER — PALONOSETRON HCL INJECTION 0.25 MG/5ML
0.2500 mg | Freq: Once | INTRAVENOUS | Status: AC
  Administered 2019-10-26: 0.25 mg via INTRAVENOUS
  Filled 2019-10-26: qty 5

## 2019-10-26 MED ORDER — LOPERAMIDE HCL 2 MG PO CAPS
2.0000 mg | ORAL_CAPSULE | ORAL | Status: DC | PRN
  Administered 2019-10-26: 20:00:00 2 mg via ORAL
  Filled 2019-10-26: qty 1

## 2019-10-26 MED ORDER — COLD PACK MISC ONCOLOGY
1.0000 | Freq: Once | Status: AC | PRN
  Filled 2019-10-26: qty 1

## 2019-10-26 MED ORDER — ATROPINE SULFATE 1 MG/ML IJ SOLN
0.5000 mg | Freq: Once | INTRAMUSCULAR | Status: AC | PRN
  Administered 2019-10-27: 0.5 mg via INTRAVENOUS
  Filled 2019-10-26 (×2): qty 0.5

## 2019-10-26 MED ORDER — ENSURE ENLIVE PO LIQD
237.0000 mL | Freq: Two times a day (BID) | ORAL | Status: DC
  Administered 2019-10-28: 237 mL via ORAL

## 2019-10-26 MED ORDER — SODIUM CHLORIDE 0.9 % IV SOLN
135.0000 mg/m2 | Freq: Once | INTRAVENOUS | Status: AC
  Administered 2019-10-26: 13:00:00 320 mg via INTRAVENOUS
  Filled 2019-10-26: qty 15

## 2019-10-26 NOTE — Progress Notes (Signed)
HEMATOLOGY-ONCOLOGY PROGRESS NOTE  SUBJECTIVE: She continues to have abdominal discomfort.  She was able to get up from the chair yesterday.  Oncology History  Colon cancer metastasized to liver [facility])  08/17/2019 Initial Diagnosis   Colon cancer metastasized to liver [facility])   08/18/2019 - 10/12/2019 Chemotherapy   The patient had palonosetron (ALOXI) injection 0.25 mg, 0.25 mg, Intravenous,  Once, 4 of 12 cycles Administration: 0.25 mg (08/18/2019), 0.25 mg (08/30/2019), 0.25 mg (09/13/2019), 0.25 mg (09/27/2019) fluorouracil (ADRUCIL) 5,200 mg in sodium chloride 0.9 % 1,000 mL chemo infusion, 2,000 mg/m2 = 5,200 mg (83.3 % of original dose 2,400 mg/m2), Intravenous, Once, 1 of 1 cycle Dose modification: 2,000 mg/m2 (original dose 2,400 mg/m2, Cycle 1, Reason: Provider Judgment) Administration: 5,200 mg (08/18/2019) leucovorin 778 mg in dextrose 5 % 250 mL infusion, 300 mg/m2 = 778 mg (100 % of original dose 300 mg/m2), Intravenous,  Once, 4 of 12 cycles Dose modification: 300 mg/m2 (original dose 300 mg/m2, Cycle 1, Reason: Provider Judgment) Administration: 778 mg (08/18/2019), 778 mg (08/30/2019), 778 mg (09/13/2019), 778 mg (09/27/2019) oxaliplatin (ELOXATIN) 170 mg in dextrose 5 % 500 mL chemo infusion, 65 mg/m2 = 170 mg (100 % of original dose 65 mg/m2), Intravenous,  Once, 4 of 12 cycles Dose modification: 65 mg/m2 (original dose 65 mg/m2, Cycle 1, Reason: Provider Judgment) Administration: 170 mg (08/18/2019), 170 mg (08/30/2019), 170 mg (09/13/2019), 170 mg (09/27/2019) fluorouracil (ADRUCIL) 4,150 mg in sodium chloride 0.9 % 67 mL chemo infusion, 1,600 mg/m2 = 4,150 mg (80 % of original dose 2,000 mg/m2), Intravenous, 1 Day/Dose, 3 of 11 cycles Dose modification: 2,000 mg/m2 (original dose 2,000 mg/m2, Cycle 2, Reason: Provider Judgment), 1,600 mg/m2 (original dose 2,000 mg/m2, Cycle 2, Reason: Provider Judgment) Administration: 4,150 mg (08/30/2019), 4,150 mg (09/13/2019), 4,150 mg  (09/27/2019)  for chemotherapy treatment.    10/26/2019 -  Chemotherapy   The patient had palonosetron (ALOXI) injection 0.25 mg, 0.25 mg, Intravenous,  Once, 1 of 4 cycles fluorouracil (ADRUCIL) 4,300 mg in sodium chloride 0.9 % 1,000 mL chemo infusion, 1,800 mg/m2 = 4,300 mg (100 % of original dose 1,800 mg/m2), Intravenous, Once, 1 of 4 cycles Dose modification: 1,800 mg/m2 (original dose 1,800 mg/m2, Cycle 1, Reason: Provider Judgment) irinotecan (CAMPTOSAR) 320 mg in sodium chloride 0.9 % 500 mL chemo infusion, 135 mg/m2 = 320 mg (100 % of original dose 135 mg/m2), Intravenous,  Once, 1 of 4 cycles Dose modification: 135 mg/m2 (original dose 135 mg/m2, Cycle 1, Reason: Provider Judgment) fluorouracil (ADRUCIL) 4,300 mg in sodium chloride 0.9 % 64 mL chemo infusion, 1,800 mg/m2 = 4,300 mg (100 % of original dose 1,800 mg/m2), Intravenous, 1 Day/Dose, 0 of 3 cycles Dose modification: 1,800 mg/m2 (original dose 1,800 mg/m2, Cycle 2, Reason: Provider Judgment) bevacizumab-bvzr (ZIRABEV) 600 mg in sodium chloride 0.9 % 100 mL chemo infusion, 5 mg/kg = 600 mg, Intravenous,  Once, 0 of 3 cycles leucovorin 718 mg in sodium chloride 0.9 % 250 mL infusion, 300 mg/m2 = 718 mg (100 % of original dose 300 mg/m2), Intravenous,  Once, 1 of 4 cycles Dose modification: 300 mg/m2 (original dose 300 mg/m2, Cycle 1, Reason: Provider Judgment)  for chemotherapy treatment.     PHYSICAL EXAMINATION:  Vitals:   10/25/19 2037 10/26/19 0555  BP: (!) 118/59 (!) 107/58  Pulse: 73 77  Resp: 18 20  Temp: 98.1 F (36.7 C) 98.1 F (36.7 C)  SpO2: 97% 93%   Filed Weights   10/23/19 0607 10/24/19 2100 10/26/19 0867  Weight: 295 lb (133.8 kg) 270 lb 1 oz (122.5 kg) 297 lb 12.8 oz (135.1 kg)    Intake/Output from previous day: 01/19 0701 - 01/20 0700 In: 600 [P.O.:600] Out: 301 [Urine:300; Stool:1]  GENERAL:alert, no distress and comfortable OROPHARYNX: No thrush or mucositis  ABDOMEN: Marked  hepatomegaly Musculoskeletal:no cyanosis of digits and no clubbing  NEURO: alert & oriented x 3 with fluent speech, no focal motor/sensory deficits  Port-A-Cath without erythema or edema  LABORATORY DATA:  I have reviewed the data as listed CMP Latest Ref Rng & Units 10/26/2019 10/25/2019 10/24/2019  Glucose 70 - 99 mg/dL 124(H) 131(H) -  BUN 8 - 23 mg/dL 17 16 -  Creatinine 0.44 - 1.00 mg/dL 0.89 0.98 1.02(H)  Sodium 135 - 145 mmol/L 136 136 -  Potassium 3.5 - 5.1 mmol/L 3.3(L) 3.2(L) -  Chloride 98 - 111 mmol/L 104 103 -  CO2 22 - 32 mmol/L 24 24 -  Calcium 8.9 - 10.3 mg/dL 8.3(L) 8.1(L) -  Total Protein 6.5 - 8.1 g/dL 5.8(L) 5.7(L) -  Total Bilirubin 0.3 - 1.2 mg/dL 1.0 1.0 -  Alkaline Phos 38 - 126 U/L 266(H) 296(H) -  AST 15 - 41 U/L 27 29 -  ALT 0 - 44 U/L 13 13 -    Lab Results  Component Value Date   WBC 21.6 (H) 10/26/2019   HGB 8.1 (L) 10/26/2019   HCT 28.6 (L) 10/26/2019   MCV 97.9 10/26/2019   PLT 146 (L) 10/26/2019   NEUTROABS 18.3 (H) 10/26/2019    CT Angio Chest PE W and/or Wo Contrast  Result Date: 10/20/2019 CLINICAL DATA:  Dyspnea on exertion EXAM: CT ANGIOGRAPHY CHEST WITH CONTRAST TECHNIQUE: Multidetector CT imaging of the chest was performed using the standard protocol during bolus administration of intravenous contrast. Multiplanar CT image reconstructions and MIPs were obtained to evaluate the vascular anatomy. CONTRAST:  5m OMNIPAQUE IOHEXOL 350 MG/ML SOLN COMPARISON:  02/05/2018 and chest x-ray of the same date FINDINGS: Cardiovascular: Heart size is enlarged without pericardial effusion. Findings similar to the prior study. Aortic caliber 4 cm of the ascending thoracic aorta. No signs of pulmonary embolism. Mildly limited assessment of the lung bases due to respiratory motion. Mediastinum/Nodes: Right-sided central venous access device terminates in the right atrium. Pre pericardial lymph node measuring 1.5 cm new from previous exams. No additional signs  of mediastinal adenopathy. Esophagus grossly normal. Mildly nodular thyroid similar to prior study. Lungs/Pleura: Small pulmonary nodules scattered throughout the right chest some within some without calcification. New pulmonary nodule right lung apex (image 20, series 6) 6 mm New right upper lobe pulmonary nodule (image 61, series 6) 6 mm New pulmonary nodule in the right lower lobe (image 84, series 6) 6 mm. Signs of basilar atelectasis on the left. No signs of pleural effusion Small left upper lobe pulmonary nodule, also appears new compared to the previous study. Upper Abdomen: Signs of hepatic metastatic disease not well evaluated. Suggestion of diffuse infiltration of the left hepatic lobe. Small amount perisplenic fluid has developed also since the study of 08/13/2019. Findings suggest interval worsening of disease in the liver though again this areas not well evaluated currently. Bulky celiac lymph node (image 88, series 4) 3.3 cm in short axis unchanged. Musculoskeletal: No destructive bone process. No acute bone finding. Review of the MIP images confirms the above findings. IMPRESSION: 1. Negative for pulmonary embolism. 2. Interval development of multiple bilateral pulmonary nodules, noncalcified findings are suspicious for metastatic disease. 3. Interval  development of a 1.5 cm pre pericardial lymph node (image 134, series 5), suspicious for metastatic disease. 4. Signs of hepatic metastatic disease not well evaluated currently, but appearing worse than on the prior study. 5. Small amount of perisplenic fluid has developed since the previous study. 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 Aortic Atherosclerosis (ICD10-I70.0). Electronically Signed   By: Zetta Bills M.D.   On: 10/20/2019 12:35   CT ABDOMEN PELVIS W CONTRAST  Result Date: 10/21/2019 CLINICAL DATA:  Metastatic colon cancer. Abdominal distension, weakness and diarrhea.  EXAM: CT ABDOMEN AND PELVIS WITH CONTRAST TECHNIQUE: Multidetector CT imaging of the abdomen and pelvis was performed using the standard protocol following bolus administration of intravenous contrast. CONTRAST:  177m OMNIPAQUE IOHEXOL 300 MG/ML  SOLN COMPARISON:  Chest CT from yesterday and CT abdomen/pelvis 08/13/2019 FINDINGS: Lower chest: A few small pulmonary nodules are noted and consistent with metastatic disease as demonstrated on yesterday's chest CT. Streaky left basilar atelectasis. No effusions or infiltrates. The heart is normal in size. No pericardial effusion. Hepatobiliary: Severe and markedly progressive hepatic metastatic disease. There is now a large confluent mass lesion occupying the left hepatic lobe and measuring 14.5 x 12.0 cm on image 36/2. Numerous lesions are also noted throughout the right hepatic lobe and these have also enlarged. Exophytic tumor versus adjacent adenopathy near the left hepatic lobe on image number 28/2 measures 4 cm. This is unchanged in size. No biliary dilatation. Pancreas: No mass, inflammation or ductal dilatation. Spleen: Normal size.  No focal lesions. Adrenals/Urinary Tract: Stable bilateral adrenal gland nodules. These are likely benign adenomas. The kidneys are unremarkable. No renal lesions or hydronephrosis. The bladder is normal. Stomach/Bowel: The stomach, duodenum and small bowel are unremarkable. No mass lesions or obstructive findings. Stable surgical changes from a right hemicolectomy. Colonic diverticulosis but no findings for obstructing mass. Vascular/Lymphatic: Moderate scattered atherosclerotic calcifications involving the aorta and iliac arteries. No aneurysm or dissection. The major venous structures are patent. Enlarging omental and peritoneal surface lesions. The largest omental lesion measures 3.9 cm on image 53/2. This previously measured 3.1 cm. Other smaller lesions have enlarged slightly also. No retroperitoneal lymphadenopathy.  Reproductive: The uterus and ovaries are unremarkable. Other: No pelvic mass, pelvic adenopathy or free pelvic fluid collections. No inguinal mass or adenopathy. Musculoskeletal: No significant bony findings. IMPRESSION: 1. Significant progression of metastatic hepatic disease suggesting a very aggressive neoplasm. 2. Enlarging omental and peritoneal surface lesions. 3. Small pulmonary nodules consistent with pulmonary metastatic disease. Electronically Signed   By: PMarijo SanesM.D.   On: 10/21/2019 16:12   DG Chest Port 1 View  Result Date: 10/20/2019 CLINICAL DATA:  Dyspnea. EXAM: PORTABLE CHEST 1 VIEW COMPARISON:  08/13/2019 FINDINGS: The heart size and pulmonary vascularity are and the lungs clear. Aortic atherosclerosis. New power port in place. Tip is in superior vena cava just below the carina. No significant bone abnormality. IMPRESSION: 1. No active disease. 2.  Aortic Atherosclerosis (ICD10-I70.0). 3. Power port in good position. Electronically Signed   By: JLorriane ShireM.D.   On: 10/20/2019 10:26   ECHOCARDIOGRAM COMPLETE  Result Date: 10/21/2019   ECHOCARDIOGRAM REPORT   Patient Name:   JDARRIELPRICE  Date of Exam: 10/21/2019 Medical Rec #:  [id number] Height:       66.0 in Accession #:    [id number]:       270.0 lb Date of Birth:  [birth year] BSA:  2.27 m Patient Age:    13 years   BP:           131/55 mmHg Patient Gender: F          HR:           108 bpm. Exam Location:  Inpatient Procedure: 2D Echo Indications:    Syncope 780.2 / R55  History:        Patient has prior history of Echocardiogram examinations, most                 recent 02/06/2018. CHF. Colon cancer. Chronic kidney disease.                 Anemia.  Sonographer:    Darlina Sicilian RDCS Referring Phys: [id number] [place]  1. Left ventricular ejection fraction, by visual estimation, is 60 to 65%. The left ventricle has normal function. There is moderately increased left ventricular hypertrophy.  2. Left  ventricular diastolic parameters are consistent with Grade II diastolic dysfunction (pseudonormalization).  3. Elevated left atrial pressure.  4. Global right ventricle has normal systolic function.The right ventricular size is normal.  5. Left atrial size was normal.  6. Right atrial size was normal.  7. The mitral valve is normal in structure. No evidence of mitral valve regurgitation.  8. The tricuspid valve is normal in structure.  9. The aortic valve was not well visualized. Aortic valve regurgitation is mild. Mild to moderate aortic valve sclerosis/calcification without any evidence of aortic stenosis. 10. The pulmonic valve was not well visualized. Pulmonic valve regurgitation is not visualized. 11. The inferior vena cava is normal in size with <50% respiratory variability, suggesting right atrial pressure of 8 mmHg. 12. TR signal is inadequate for assessing pulmonary artery systolic pressure. 13. There is dilatation of the ascending aorta measuring 41 mm. FINDINGS  Left Ventricle: Left ventricular ejection fraction, by visual estimation, is 60 to 65%. The left ventricle has normal function. The left ventricle has no regional wall motion abnormalities. There is moderately increased left ventricular hypertrophy. Left ventricular diastolic parameters are consistent with Grade II diastolic dysfunction (pseudonormalization). Elevated left atrial pressure. Right Ventricle: The right ventricular size is normal. No increase in right ventricular wall thickness. Global RV systolic function is has normal systolic function. Left Atrium: Left atrial size was normal in size. Right Atrium: Right atrial size was normal in size Pericardium: Trivial pericardial effusion is present. Mitral Valve: The mitral valve is normal in structure. No evidence of mitral valve regurgitation. Tricuspid Valve: The tricuspid valve is normal in structure. Tricuspid valve regurgitation is not demonstrated. Aortic Valve: The aortic valve was not  well visualized. Aortic valve regurgitation is mild. Aortic regurgitation PHT measures 465 msec. Mild to moderate aortic valve sclerosis/calcification is present, without any evidence of aortic stenosis. Pulmonic Valve: The pulmonic valve was not well visualized. Pulmonic valve regurgitation is not visualized. Pulmonic regurgitation is not visualized. Aorta: Aortic dilatation noted. There is dilatation of the ascending aorta measuring 41 mm. Venous: The inferior vena cava is normal in size with less than 50% respiratory variability, suggesting right atrial pressure of 8 mmHg. IAS/Shunts: The interatrial septum was not well visualized.  LEFT VENTRICLE PLAX 2D LVIDd:         3.70 cm  Diastology LVIDs:         2.30 cm  LV e' lateral:   5.66 cm/s LV PW:         1.30 cm  LV E/e' lateral:  17.2 LV IVS:        1.40 cm  LV e' medial:    5.77 cm/s LVOT diam:     2.00 cm  LV E/e' medial:  16.9 LV SV:         40 ml LV SV Index:   16.31 LVOT Area:     3.14 cm  RIGHT VENTRICLE RV S prime:     24.90 cm/s TAPSE (M-mode): 1.9 cm LEFT ATRIUM             Index       RIGHT ATRIUM           Index LA diam:        4.40 cm 1.94 cm/m  RA Area:     13.00 cm LA Vol (A2C):   58.9 ml 25.93 ml/m RA Volume:   29.50 ml  12.99 ml/m LA Vol (A4C):   64.8 ml 28.52 ml/m LA Biplane Vol: 68.0 ml 29.93 ml/m  AORTIC VALVE LVOT Vmax:   108.00 cm/s LVOT Vmean:  73.600 cm/s LVOT VTI:    0.169 m AI PHT:      465 msec  AORTA Ao Root diam: 3.10 cm Ao Asc diam:  4.10 cm MITRAL VALVE MV Area (PHT): 4.36 cm              SHUNTS MV PHT:        50.46 msec            Systemic VTI:  0.17 m MV Decel Time: 174 msec              Systemic Diam: 2.00 cm MV E velocity: 97.40 cm/s  103 cm/s MV A velocity: 106.00 cm/s 70.3 cm/s MV E/A ratio:  0.92        1.5  Oswaldo Milian MD Electronically signed by Oswaldo Milian MD Signature Date/Time: 10/21/2019/7:54:01 PM    Final     ASSESSMENT AND PLAN: 1. 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), right colectomy  04/22/2018  Ascending colon, moderately differentiated mucinous adenocarcinoma, no lymphovascular perineural invasion, no tumor deposits, tumor budding-low, 0/14 lymph nodes, tumor invades into the subserosa and is focally present at the inked serosal surface (pT4), no loss of mismatch repair protein expression  Colonoscopy 06/13/2017-ascending colon mass-adenocarcinoma, polyps in the ascending and sigmoid-tubular adenomas  CT abdomen/pelvis 08/13/2019-multiple peritoneal masses compatible with metastatic disease, multiple hypodense liver lesions consistent with metastatic disease  CEA 08/13/2019 -806.0  CT biopsy of a peritoneal mass 08/15/2019, metastatic adenocarcinoma consistent with colon cancer  MSS, tumor mutation burden 5, KRAS G12V ? Cycle 1 FOLFOX 08/18/2019  ? Cycle 2 FOLFOX 08/30/2019-5-FU dose reduced secondary to mucositis ? Cycle 3 FOLFOX 09/13/2019  CT of the abdomen pelvis on 10/21/2019-significant progression of metastatic hepatic disease suggesting a very aggressive neoplasm, enlarging omental and peritoneal surface lesions, small pulmonary nodules consistent with pulmonary metastatic disease  Cycle 1 FOLFIRI 10/26/2019 2.Acute pulmonary embolism and left popliteal DVT 02/05/2018 3.Iron deficiency anemia September 2018 4.Atrial fibrillation 5.Congestive heart failure 6.Hypertension 7.Morbid obesity 8.Renal failure 9.  Hospital admission 10/20/2019-near syncope, anemia, leukocytosis  Ms. Gfeller appears unchanged.  The plan is to begin FOLFIRI chemotherapy today.  We again reviewed potential toxicities associated with this regimen.  She agrees to proceed.  Avastin will be held from the first cycle due to cost of this agent in a hospitalized patient.  Ms. Perfecto is waiting on skilled nursing facility placement.  I reviewed the treatment plan with the Cancer center pharmacy.  LOS: 6 days   Betsy Coder, DNP, AGPCNP-BC, AOCNP 10/26/19

## 2019-10-26 NOTE — Progress Notes (Signed)
Chemotherapy agents (Irinotecan and Fluorouracil) and Leucovorin were verified with Aldean Baker, RN.  All patient identifiers correct, and drug dosages, volumes, rates were correct based on chemotherapy dosing and patient's BSA.  Zandra Abts [facility]  10/26/2019  12:14 PM

## 2019-10-26 NOTE — Progress Notes (Signed)
Chemotherapy - irinotecan, leucovorin, and fluorouracil verified with Cline Crock, RN based on normal dosing of chemotherapy and patient's BSA.    Patient education completed with patient verbalizing understanding.  Consent signed and placed on chart.

## 2019-10-26 NOTE — Progress Notes (Signed)
PROGRESS NOTE  Amber Medina OW:[id number] DOB: 29-Jul-1951 DOA: 10/20/2019 PCP: [name], MD  HPI/Recap of past 81 hours: 69 year old female with history of chronic A. fib, chronic diastolic CHF, anemia of chronic disease, metastatic colon cancer currently on chemotherapy presented to the emergency room with episode of feeling dizzy when getting out of bed.  Patient also has progressive deconditioning and weakness.  She is on her fifth cycle of chemo.  In the ED, CTA was negative for PE.  Due to elevated procalcitonin and lactic acid she was started on empiric IV abx on 10/22/19.  Cultures are negative to date.  No clear source of infection thus far but WBC, procalcitonin and lactic acid are trending down.  Completed empiric IV antibiotics.  Followed by oncology plan to restart chemotherapy.  10/26/19: Seen and examined.  Mainly complains of generalized weakness. Start of cycle 1 FOLFIRI .     Assessment/Plan: Principal Problem:   Near syncope Active Problems:   Chronic diastolic (congestive) heart failure (HCC)   Persistent atrial fibrillation (HCC)   Colon cancer metastasized to liver (HCC)   Leukocytosis   Near syncope: Probably due to deconditioning.  In sinus rhythm.  Echocardiogram showed left ventricular hypertrophy moderately.  LVEF 60 to 65%.  Grade 2 diastolic dysfunction.  No recurrence while in the hospital.  Metastatic colon cancer: Clinical metastasis to liver, CT chest with evidence of metastasis.  Oncology following.  Appreciate assistance.  Will start FOLFIRI/Avastin on 10/26/19  Severe protein calorie malnutrition in the setting of malignancy Albumin 1.5 Dietary consulted to assist Oral supplements added  Anemia of chronic disease Hemoglobin stable at 8.1 No sign of overt bleeding MCV 97.9.  Ascending aorta aneurysm measuring 4.1 cm on 2D echo done on 10/19/2019. Maintain blood pressure normotensive Follow-up outpatient with repeat imaging  Paroxysmal  A. fib: Currently sinus rhythm.  Rule out arrhythmias.  On beta-blockers, Cardizem and amiodarone and rate control.  On Xarelto for CVA prevention.  Leukocytosis/elevated procalcitonin and elevated lactic acid:  1/16 Elevated procalcitonin up to 3.14, elevated lactic acid up to 2.7 Markers are trending down on IV antibiotics Dc IV vancomycin and continue Zosyn for total of 5 days.  MRSA negative. Started on IV antibiotics IV vancomycin and Zosyn empirically on 10/22/2019.   No clear sources of infection Cx negative to date WBC continues to trend down.  Deconditioning/physical debility:  PT recommending SNF.  Transition of care consulted to assist with SNF placement. Continue PT OT with assistance and fall precautions as tolerated.  Goals of care Palliative care team following Appreciate assistance.   DVT prophylaxis: Xarelto Code Status: Partial Family Communication: None Disposition Plan: Pending clinical improvement.  Will plan to dc to SNF when oncology signs off.  Consultants:   Oncology  Palliative care team.  Procedures:   None  Antimicrobials:   None     Objective: Vitals:   10/25/19 2037 10/26/19 0555 10/26/19 0621 10/26/19 1233  BP: (!) 118/59 (!) 107/58  (!) 118/58  Pulse: 73 77  70  Resp: 18 20  19   Temp: 98.1 F (36.7 C) 98.1 F (36.7 C)  97.9 F (36.6 C)  TempSrc: Oral Oral  Oral  SpO2: 97% 93%  95%  Weight:   135.1 kg   Height:        Intake/Output Summary (Last 24 hours) at 10/26/2019 1409 Last data filed at 10/26/2019 0553 Gross per 24 hour  Intake 600 ml  Output 300 ml  Net 300 ml  Filed Weights   10/23/19 0607 10/24/19 2100 10/26/19 0621  Weight: 133.8 kg 122.5 kg 135.1 kg    Exam:  . General: 69 y.o. year-old female Pleasant obese in no acute distress.  Alert and oriented x3.  .  Cardiovascular: Regular rate and rhythm no rubs or gallops. Marland Kitchen Respiratory: Clear to auscultation no wheezes or rales.   . Abdomen: Obese  normal bowel sounds present.   . Musculoskeletal: No lower extremity edema bilaterally. Psychiatry: Mood is appropriate for condition and setting.  Data Reviewed: CBC: Recent Labs  Lab 10/21/19 0840 10/21/19 0840 10/22/19 0448 10/23/19 0448 10/24/19 0913 10/25/19 0754 10/26/19 0528  WBC 35.2*   < > 36.0* 39.5* 32.6* 24.4* 21.6*  NEUTROABS 30.4*  --  31.3* 34.4*  --  20.8* 18.3*  HGB 7.5*   < > 8.2* 7.9* 7.9* 8.1* 8.1*  HCT 26.3*   < > 29.0* 27.8* 28.3* 28.3* 28.6*  MCV 96.7   < > 95.7 96.2 98.3 98.3 97.9  PLT 249   < > 210 183 193 156 146*   < > = values in this interval not displayed.   Basic Metabolic Panel: Recent Labs  Lab 10/20/19 1010 10/20/19 1010 10/21/19 0504 10/21/19 0840 10/22/19 0842 10/23/19 0448 10/24/19 0439 10/25/19 0754 10/26/19 0528  NA 136  --  137  --   --  135  --  136 136  K 3.5  --  3.7  --   --  3.6  --  3.2* 3.3*  CL 99  --  102  --   --  101  --  103 104  CO2 20*  --  25  --   --  23  --  24 24  GLUCOSE 196*  --  119*  --   --  100*  --  131* 124*  BUN 12  --  14  --   --  15  --  16 17  CREATININE 0.95   < > 0.88  --   --  0.95 1.02* 0.98 0.89  CALCIUM 8.3*  --  7.8*  --   --  8.0*  --  8.1* 8.3*  MG  --   --   --  1.6* 2.2  --   --   --   --   PHOS  --   --   --  3.9  --   --   --   --   --    < > = values in this interval not displayed.   GFR: Estimated Creatinine Clearance: 83.2 mL/min (by C-G formula based on SCr of 0.89 mg/dL). Liver Function Tests: Recent Labs  Lab 10/20/19 1010 10/21/19 0504 10/23/19 0448 10/25/19 0754 10/26/19 0528  AST 27 49* 31 29 27   ALT 11 12 11 13 13   ALKPHOS 213* 241* 248* 296* 266*  BILITOT 1.1 0.6 1.1 1.0 1.0  PROT 6.4* 6.2* 5.6* 5.7* 5.8*  ALBUMIN 1.7* 1.5* 1.5* 1.4* 1.5*   Recent Labs  Lab 10/20/19 1010  LIPASE 16   No results for input(s): AMMONIA in the last 168 hours. Coagulation Profile: Recent Labs  Lab 10/20/19 1010  INR 1.6*   Cardiac Enzymes: No results for input(s):  CKTOTAL, CKMB, CKMBINDEX, TROPONINI in the last 168 hours. BNP (last 3 results) No results for input(s): PROBNP in the last 8760 hours. HbA1C: No results for input(s): HGBA1C in the last 72 hours. CBG: No results for input(s): GLUCAP in the last 168 hours. Lipid  Profile: No results for input(s): CHOL, HDL, LDLCALC, TRIG, CHOLHDL, LDLDIRECT in the last 72 hours. Thyroid Function Tests: No results for input(s): TSH, T4TOTAL, FREET4, T3FREE, THYROIDAB in the last 72 hours. Anemia Panel: No results for input(s): VITAMINB12, FOLATE, FERRITIN, TIBC, IRON, RETICCTPCT in the last 72 hours. Urine analysis:    Component Value Date/Time   COLORURINE AMBER (A) 08/13/2019 0222   APPEARANCEUR CLOUDY (A) 08/13/2019 0222   LABSPEC 1.012 08/13/2019 0222   PHURINE 5.0 08/13/2019 0222   GLUCOSEU 50 (A) 08/13/2019 0222   HGBUR SMALL (A) 08/13/2019 0222   BILIRUBINUR NEGATIVE 08/13/2019 0222   KETONESUR NEGATIVE 08/13/2019 0222   PROTEINUR 100 (A) 08/13/2019 0222   NITRITE NEGATIVE 08/13/2019 0222   LEUKOCYTESUR NEGATIVE 08/13/2019 0222   Sepsis Labs: @LABRCNTIP (procalcitonin:4,lacticidven:4)  ) Recent Results (from the past 240 hour(s))  Respiratory Panel by RT PCR (Flu A&B, Covid) - Nasopharyngeal Swab     Status: None   Collection Time: 10/20/19 10:10 AM   Specimen: Nasopharyngeal Swab  Result Value Ref Range Status   SARS Coronavirus 2 by RT PCR NEGATIVE NEGATIVE Final    Comment: (NOTE) SARS-CoV-2 target nucleic acids are NOT DETECTED. The SARS-CoV-2 RNA is generally detectable in upper respiratoy specimens during the acute phase of infection. The lowest concentration of SARS-CoV-2 viral copies this assay can detect is 131 copies/mL. A negative result does not preclude SARS-Cov-2 infection and should not be used as the sole basis for treatment or other patient management decisions. A negative result may occur with  improper specimen collection/handling, submission of specimen  other than nasopharyngeal swab, presence of viral mutation(s) within the areas targeted by this assay, and inadequate number of viral copies (<131 copies/mL). A negative result must be combined with clinical observations, patient history, and epidemiological information. The expected result is Negative. Fact Sheet for Patients:  PinkCheek.be Fact Sheet for Healthcare Providers:  GravelBags.it This test is not yet ap proved or cleared by the [place] FDA and  has been authorized for detection and/or diagnosis of SARS-CoV-2 by FDA under an Emergency Use Authorization (EUA). This EUA will remain  in effect (meaning this test can be used) for the duration of the COVID-19 declaration under Section 564(b)(1) of the Act, 21 U.S.C. section 360bbb-3(b)(1), unless the authorization is terminated or revoked sooner.    Influenza A by PCR NEGATIVE NEGATIVE Final   Influenza B by PCR NEGATIVE NEGATIVE Final    Comment: (NOTE) The Xpert Xpress SARS-CoV-2/FLU/RSV assay is intended as an aid in  the diagnosis of influenza from Nasopharyngeal swab specimens and  should not be used as a sole basis for treatment. Nasal washings and  aspirates are unacceptable for Xpert Xpress SARS-CoV-2/FLU/RSV  testing. Fact Sheet for Patients: PinkCheek.be Fact Sheet for Healthcare Providers: GravelBags.it This test is not yet approved or cleared by the [place] FDA and  has been authorized for detection and/or diagnosis of SARS-CoV-2 by  FDA under an Emergency Use Authorization (EUA). This EUA will remain  in effect (meaning this test can be used) for the duration of the  Covid-19 declaration under Section 564(b)(1) of the Act, 21  U.S.C. section 360bbb-3(b)(1), unless the authorization is  terminated or revoked. Performed at [facility], [place] [address] [place], [place] [address]   Culture, blood (routine x 2)     Status: None   Collection Time: 10/21/19 11:38 AM   Specimen: BLOOD  Result Value Ref Range Status   Specimen Description   Final  BLOOD RIGHT ARM Performed at [facility], [place] [address], [place], [place] [address]    Special Requests   Final    BOTTLES DRAWN AEROBIC AND ANAEROBIC Blood Culture adequate volume Performed at [place] [address], [place], [place] [address]    Culture   Final    NO GROWTH 5 DAYS Performed at [facility], [place] [address], [place], [place] [address]    Report Status 10/26/2019 FINAL  Final  Culture, blood (routine x 2)     Status: None   Collection Time: 10/21/19 11:38 AM   Specimen: BLOOD  Result Value Ref Range Status   Specimen Description   Final    BLOOD LEFT ARM Performed at [place] [address], [place], [place] [address]    Special Requests   Final    BOTTLES DRAWN AEROBIC ONLY Blood Culture adequate volume Performed at [place] [address] [place], [place] [address]    Culture   Final    NO GROWTH 5 DAYS Performed at [facility], [place] [address] [place], [place] [address]    Report Status 10/26/2019 FINAL  Final  MRSA PCR Screening     Status: None   Collection Time: 10/22/19  6:04 PM   Specimen: Nasal Mucosa; Nasopharyngeal  Result Value Ref Range Status   MRSA by PCR NEGATIVE NEGATIVE Final    Comment:        The GeneXpert MRSA Assay (FDA approved for NASAL specimens only), is one component of a comprehensive MRSA colonization surveillance program. It is not intended to diagnose MRSA infection nor to guide or monitor treatment for MRSA infections. Performed at [facility], [place] [address] [place], [place] [address]   Culture, Urine     Status: None   Collection Time: 10/23/19 12:23 PM   Specimen: Urine, Clean Catch  Result  Value Ref Range Status   Specimen Description   Final    URINE, CLEAN CATCH Performed at [facility], [place] [address], [place], [place] [address]    Special Requests   Final    Immunocompromised Performed at [facility], [place] [address] [place], [place] [address]    Culture   Final    NO GROWTH Performed at [facility], [place] [address], [place],  [address]    Report Status 10/24/2019 FINAL  Final      Studies: No results found.  Scheduled Meds: . amiodarone  100 mg Oral QHS  . Chlorhexidine Gluconate Cloth  6 each Topical Daily  . diltiazem  240 mg Oral QHS  . feeding supplement (ENSURE ENLIVE)  237 mL Oral BID BM  . ferrous gluconate  324 mg Oral TID WC  . FLUOROURACIL (ADRUCIL) CHEMO infusion For Inpatient Use  1,800 mg/m2 (Treatment Plan Recorded) Intravenous Once  . irinotecan (CAMPTOSAR) CHEMO IV infusion  135 mg/m2 (Treatment Plan Recorded) Intravenous Once  . leucovorin (WELLCOVORIN) IV infusion (NS)  300 mg/m2 (Treatment Plan Recorded) Intravenous Once  . metoprolol succinate  25 mg Oral QHS  . rivaroxaban  20 mg Oral Q supper  . sodium chloride flush  10-40 mL Intracatheter Q12H  . sodium chloride flush  3 mL Intravenous Q12H    Continuous Infusions: . sodium chloride    . sodium chloride       LOS: 6 days     Kayleen Memos, MD [facility] Pager  [phone]  If 7PM-7AM, please contact night-coverage [email or web address] Password [email or web address] 10/26/2019, 2:09 PM

## 2019-10-26 NOTE — Progress Notes (Signed)
Initial Nutrition Assessment  DOCUMENTATION CODES:   Morbid obesity  INTERVENTION:   -Ensure Enlive po BID, each supplement provides 350 kcal and 20 grams of protein  NUTRITION DIAGNOSIS:   Increased nutrient needs related to cancer and cancer related treatments as evidenced by estimated needs.  GOAL:   Patient will meet greater than or equal to 90% of their needs  MONITOR:   PO intake, Supplement acceptance, Labs, Weight trends, I & O's  REASON FOR ASSESSMENT:   Consult Assessment of nutrition requirement/status  ASSESSMENT:   69 year old female with history of chronic A. fib, chronic diastolic CHF, anemia of chronic disease, metastatic colon cancer currently on chemotherapy presented to the emergency room with episode of feeling dizzy when getting out of bed.  Patient also has progressive deconditioning and weakness.  She is on her fifth cycle of chemo.  Admitted for near syncope.  **RD working remotely**  Patient beginning FOLFIRI/Avastin today. Pt with metastatic colon cancer, last completed chemotherapy on 1/6. Pt reported that she was bedbound for days PTA. Pt has had decreased appetite recently.  Last PO documented on 1/17, pt consumed 75-100% of meals.  Pt in the past few days has had abdominal pain. RD will order Ensure supplements to provide additional kcals and protein given increased needs d/t chemotherapy.  Admission weight: 270 lbs. Current weight: 297 lbs. Per weight records, pt has lost 44 lbs since 08/30/19 (14% wt loss x 2 months, significant for time frame).  Medications: Fergon tablet Labs reviewed: Low K  NUTRITION - FOCUSED PHYSICAL EXAM:  Working remotely.  Diet Order:   Diet Order            Diet heart healthy/carb modified Room service appropriate? Yes; Fluid consistency: Thin  Diet effective now              EDUCATION NEEDS:   No education needs have been identified at this time  Skin:  Skin Assessment: Reviewed RN  Assessment  Last BM:  1/20 -type 5  Height:   Ht Readings from Last 1 Encounters:  10/24/19 5\' 6"  (1.676 m)    Weight:   Wt Readings from Last 1 Encounters:  10/26/19 135.1 kg    Ideal Body Weight:  59.1 kg  BMI:  Body mass index is 48.07 kg/m.  Estimated Nutritional Needs:   Kcal:  2150-2350  Protein:  85-95g  Fluid:  2.1L/day   Clayton Bibles, MS, RD, LDN Inpatient Clinical Dietitian Pager: [phone] After Hours Pager: [phone]

## 2019-10-27 LAB — COMPREHENSIVE METABOLIC PANEL
ALT: 13 U/L (ref 0–44)
AST: 20 U/L (ref 15–41)
Albumin: 1.6 g/dL — ABNORMAL LOW (ref 3.5–5.0)
Alkaline Phosphatase: 278 U/L — ABNORMAL HIGH (ref 38–126)
Anion gap: 11 (ref 5–15)
BUN: 25 mg/dL — ABNORMAL HIGH (ref 8–23)
CO2: 22 mmol/L (ref 22–32)
Calcium: 8.3 mg/dL — ABNORMAL LOW (ref 8.9–10.3)
Chloride: 103 mmol/L (ref 98–111)
Creatinine, Ser: 0.91 mg/dL (ref 0.44–1.00)
GFR calc Af Amer: >60 mL/min (ref >60)
GFR calc non Af Amer: >60 mL/min (ref >60)
Glucose, Bld: 201 mg/dL — ABNORMAL HIGH (ref 70–99)
Potassium: 3.8 mmol/L (ref 3.5–5.1)
Sodium: 136 mmol/L (ref 135–145)
Total Bilirubin: 1.1 mg/dL (ref 0.3–1.2)
Total Protein: 6.2 g/dL — ABNORMAL LOW (ref 6.5–8.1)

## 2019-10-27 LAB — CBC WITH DIFFERENTIAL/PLATELET
Abs Immature Granulocytes: 0.14 10*3/uL — ABNORMAL HIGH (ref 0.00–0.07)
Basophils Absolute: 0 10*3/uL (ref 0.0–0.1)
Basophils Relative: 0 %
Eosinophils Absolute: 0 10*3/uL (ref 0.0–0.5)
Eosinophils Relative: 0 %
HCT: 31.8 % — ABNORMAL LOW (ref 36.0–46.0)
Hemoglobin: 8.9 g/dL — ABNORMAL LOW (ref 12.0–15.0)
Immature Granulocytes: 1 %
Lymphocytes Relative: 4 %
Lymphs Abs: 0.7 10*3/uL (ref 0.7–4.0)
MCH: 27.4 pg (ref 26.0–34.0)
MCHC: 28 g/dL — ABNORMAL LOW (ref 30.0–36.0)
MCV: 97.8 fL (ref 80.0–100.0)
Monocytes Absolute: 0.8 10*3/uL (ref 0.1–1.0)
Monocytes Relative: 4 %
Neutro Abs: 17.4 10*3/uL — ABNORMAL HIGH (ref 1.7–7.7)
Neutrophils Relative %: 91 %
Platelets: 152 10*3/uL (ref 150–400)
RBC: 3.25 MIL/uL — ABNORMAL LOW (ref 3.87–5.11)
RDW: 19.6 % — ABNORMAL HIGH (ref 11.5–15.5)
WBC: 19 10*3/uL — ABNORMAL HIGH (ref 4.0–10.5)
nRBC: 0 % (ref 0.0–0.2)

## 2019-10-27 LAB — SARS CORONAVIRUS 2 (TAT 6-24 HRS): SARS Coronavirus 2: NEGATIVE

## 2019-10-27 LAB — PHOSPHORUS: Phosphorus: 5.2 mg/dL — ABNORMAL HIGH (ref 2.5–4.6)

## 2019-10-27 LAB — MAGNESIUM: Magnesium: 1.9 mg/dL (ref 1.7–2.4)

## 2019-10-27 MED ORDER — LORAZEPAM 2 MG/ML IJ SOLN
0.5000 mg | Freq: Once | INTRAMUSCULAR | Status: AC
  Administered 2019-10-27: 0.5 mg via INTRAVENOUS
  Filled 2019-10-27: qty 1

## 2019-10-27 MED ORDER — SODIUM CHLORIDE 0.9 % IV SOLN: INTRAVENOUS | Status: AC

## 2019-10-27 NOTE — Progress Notes (Signed)
PROGRESS NOTE  Amber Medina [id number] DOB: 25-Aug-1951 DOA: 10/20/2019 PCP: [name], MD  HPI/Recap of past 89 hours: 69 year old female with history of chronic A. fib, chronic diastolic CHF, anemia of chronic disease, metastatic colon cancer currently on chemotherapy presented to the emergency room with episode of feeling dizzy when getting out of bed.  Patient also has progressive deconditioning and weakness.  She is on her fifth cycle of chemo.  In the ED, CTA was negative for PE.  Due to elevated procalcitonin and lactic acid she was started on empiric IV abx on 10/22/19.  Cultures are negative to date.  No clear source of infection thus far but WBC, procalcitonin and lactic acid are trending down.  Completed empiric IV antibiotics.  Followed by oncology plan to restart chemotherapy.  Start of cycle 1 FOLFIRI 1/20.  10/27/19: Seen and examined.  Reports generalized weakness.  Also reports diarrhea which is improving after starting Imodium by oncology.  Denies nausea.  Denies any cardiopulmonary symptoms.   Assessment/Plan: Principal Problem:   Near syncope Active Problems:   Chronic diastolic (congestive) heart failure (HCC)   Persistent atrial fibrillation (HCC)   Colon cancer metastasized to liver (HCC)   Leukocytosis   Near syncope: Probably due to deconditioning.  In sinus rhythm.  Echocardiogram showed left ventricular hypertrophy moderately.  LVEF 60 to 65%.  Grade 2 diastolic dysfunction.  No recurrence while in the hospital.  Metastatic colon cancer: Clinical metastasis to liver, CT chest with evidence of metastasis.  Oncology following.  Appreciate assistance.  Start of cycle 1 FOLFIRI/Avastin on 10/26/19  Diarrhea likely secondary to chemotherapy Discussed with [name], started on Imodium as needed Diarrhea improving on Imodium Start gentle IV fluid to avoid dehydration.  Severe protein calorie malnutrition in the setting of malignancy Albumin 1.5 Dietary  consulted to assist Oral supplements added  Anemia of chronic disease Hemoglobin stable at 8.1 No sign of overt bleeding MCV 97.9.  Ascending aorta aneurysm measuring 4.1 cm on 2D echo done on 10/19/2019. Maintain blood pressure normotensive Follow-up outpatient with repeat imaging  Paroxysmal A. fib: Currently sinus rhythm.  Rule out arrhythmias.  On beta-blockers, Cardizem and amiodarone and rate control.  On Xarelto for CVA prevention.  Leukocytosis/elevated procalcitonin and elevated lactic acid:  1/16 Elevated procalcitonin up to 3.14, elevated lactic acid up to 2.7 Markers are trending down on IV antibiotics Dc IV vancomycin and continue Zosyn for total of 5 days.  MRSA negative. Started on IV antibiotics IV vancomycin and Zosyn empirically on 10/22/2019.   No clear sources of infection Cx negative to date WBC continues to trend down.  Deconditioning/physical debility:  PT recommending SNF.  Transition of care consulted to assist with SNF placement. Continue PT OT with assistance and fall precautions as tolerated.  Goals of care Partial code and undergoing chemotherapy.   DVT prophylaxis: Xarelto Code Status: Partial Family Communication: None at bedside.  Disposition Plan:  Patient is from home.  Plan is to discharge to SNF tomorrow 10/28/2019, discussed with oncology [name].  Keeping in the hospital today for IV fluid hydration after her chemotherapy and episode of diarrhea overnight and this morning.  No anticipated barrier to discharge tomorrow.  Consultants:   Oncology  Palliative care team.  Procedures:   None  Antimicrobials:   None     Objective: Vitals:   10/26/19 1233 10/26/19 2031 10/27/19 0500 10/27/19 0514  BP: (!) 118/58 129/70  124/66  Pulse: 70 67  64  Resp:  19 17  17   Temp: 97.9 F (36.6 C) (!) 97.5 F (36.4 C)    TempSrc: Oral Oral    SpO2: 95% 93%  95%  Weight:   (!) 136.5 kg   Height:        Intake/Output Summary  (Last 24 hours) at 10/27/2019 1318 Last data filed at 10/27/2019 0557 Gross per 24 hour  Intake 413.91 ml  Output 300 ml  Net 113.91 ml   Filed Weights   10/24/19 2100 10/26/19 0621 10/27/19 0500  Weight: 122.5 kg 135.1 kg (!) 136.5 kg    Exam:  . General: 69 y.o. year-old female pleasant appears uncomfortable due to diarrhea.  .  Cardiovascular: Regular rate and rhythm no rubs or gallops. Marland Kitchen Respiratory: Clear to auscultation no wheezes or rales. . Abdomen: Obese bowel sounds present..   . Musculoskeletal: No lower extremity edema bilaterally.   Marland Kitchen Psychiatry: Mood is appropriate for condition and setting.  Data Reviewed: CBC: Recent Labs  Lab 10/22/19 0448 10/22/19 0448 10/23/19 0448 10/24/19 0913 10/25/19 0754 10/26/19 0528 10/27/19 0553  WBC 36.0*   < > 39.5* 32.6* 24.4* 21.6* 19.0*  NEUTROABS 31.3*  --  34.4*  --  20.8* 18.3* 17.4*  HGB 8.2*   < > 7.9* 7.9* 8.1* 8.1* 8.9*  HCT 29.0*   < > 27.8* 28.3* 28.3* 28.6* 31.8*  MCV 95.7   < > 96.2 98.3 98.3 97.9 97.8  PLT 210   < > 183 193 156 146* 152   < > = values in this interval not displayed.   Basic Metabolic Panel: Recent Labs  Lab 10/21/19 0504 10/21/19 0504 10/21/19 0840 10/22/19 [id number] 10/23/19 0448 10/24/19 0439 10/25/19 0754 10/26/19 0528 10/27/19 0553  NA 137  --   --   --  135  --  136 136 136  K 3.7  --   --   --  3.6  --  3.2* 3.3* 3.8  CL 102  --   --   --  101  --  103 104 103  CO2 25  --   --   --  23  --  24 24 22   GLUCOSE 119*  --   --   --  100*  --  131* 124* 201*  BUN 14  --   --   --  15  --  16 17 25*  CREATININE 0.88   < >  --   --  0.95 1.02* 0.98 0.89 0.91  CALCIUM 7.8*  --   --   --  8.0*  --  8.1* 8.3* 8.3*  MG  --   --  1.6* 2.2  --   --   --   --  1.9  PHOS  --   --  3.9  --   --   --   --   --  5.2*   < > = values in this interval not displayed.   GFR: Estimated Creatinine Clearance: 81.9 mL/min (by C-G formula based on SCr of 0.91 mg/dL). Liver Function Tests: Recent Labs    Lab 10/21/19 0504 10/23/19 0448 10/25/19 0754 10/26/19 0528 10/27/19 0553  AST 49* 31 29 27 20   ALT 12 11 13 13 13   ALKPHOS 241* 248* 296* 266* 278*  BILITOT 0.6 1.1 1.0 1.0 1.1  PROT 6.2* 5.6* 5.7* 5.8* 6.2*  ALBUMIN 1.5* 1.5* 1.4* 1.5* 1.6*   No results for input(s): LIPASE, AMYLASE in the last 168 hours. No results for input(s):  AMMONIA in the last 168 hours. Coagulation Profile: No results for input(s): INR, PROTIME in the last 168 hours. Cardiac Enzymes: No results for input(s): CKTOTAL, CKMB, CKMBINDEX, TROPONINI in the last 168 hours. BNP (last 3 results) No results for input(s): PROBNP in the last 8760 hours. HbA1C: No results for input(s): HGBA1C in the last 72 hours. CBG: No results for input(s): GLUCAP in the last 168 hours. Lipid Profile: No results for input(s): CHOL, HDL, LDLCALC, TRIG, CHOLHDL, LDLDIRECT in the last 72 hours. Thyroid Function Tests: No results for input(s): TSH, T4TOTAL, FREET4, T3FREE, THYROIDAB in the last 72 hours. Anemia Panel: No results for input(s): VITAMINB12, FOLATE, FERRITIN, TIBC, IRON, RETICCTPCT in the last 72 hours. Urine analysis:    Component Value Date/Time   COLORURINE AMBER (A) 08/13/2019 0222   APPEARANCEUR CLOUDY (A) 08/13/2019 0222   LABSPEC 1.012 08/13/2019 0222   PHURINE 5.0 08/13/2019 0222   GLUCOSEU 50 (A) 08/13/2019 0222   HGBUR SMALL (A) 08/13/2019 0222   BILIRUBINUR NEGATIVE 08/13/2019 0222   KETONESUR NEGATIVE 08/13/2019 0222   PROTEINUR 100 (A) 08/13/2019 0222   NITRITE NEGATIVE 08/13/2019 0222   LEUKOCYTESUR NEGATIVE 08/13/2019 0222   Sepsis Labs: @LABRCNTIP (procalcitonin:4,lacticidven:4)  ) Recent Results (from the past 240 hour(s))  Respiratory Panel by RT PCR (Flu A&B, Covid) - Nasopharyngeal Swab     Status: None   Collection Time: 10/20/19 10:10 AM   Specimen: Nasopharyngeal Swab  Result Value Ref Range Status   SARS Coronavirus 2 by RT PCR NEGATIVE NEGATIVE Final    Comment:  (NOTE) SARS-CoV-2 target nucleic acids are NOT DETECTED. The SARS-CoV-2 RNA is generally detectable in upper respiratoy specimens during the acute phase of infection. The lowest concentration of SARS-CoV-2 viral copies this assay can detect is 131 copies/mL. A negative result does not preclude SARS-Cov-2 infection and should not be used as the sole basis for treatment or other patient management decisions. A negative result may occur with  improper specimen collection/handling, submission of specimen other than nasopharyngeal swab, presence of viral mutation(s) within the areas targeted by this assay, and inadequate number of viral copies (<131 copies/mL). A negative result must be combined with clinical observations, patient history, and epidemiological information. The expected result is Negative. Fact Sheet for Patients:  PinkCheek.be Fact Sheet for Healthcare Providers:  GravelBags.it This test is not yet ap proved or cleared by the [place] FDA and  has been authorized for detection and/or diagnosis of SARS-CoV-2 by FDA under an Emergency Use Authorization (EUA). This EUA will remain  in effect (meaning this test can be used) for the duration of the COVID-19 declaration under Section 564(b)(1) of the Act, 21 U.S.C. section 360bbb-3(b)(1), unless the authorization is terminated or revoked sooner.    Influenza A by PCR NEGATIVE NEGATIVE Final   Influenza B by PCR NEGATIVE NEGATIVE Final    Comment: (NOTE) The Xpert Xpress SARS-CoV-2/FLU/RSV assay is intended as an aid in  the diagnosis of influenza from Nasopharyngeal swab specimens and  should not be used as a sole basis for treatment. Nasal washings and  aspirates are unacceptable for Xpert Xpress SARS-CoV-2/FLU/RSV  testing. Fact Sheet for Patients: PinkCheek.be Fact Sheet for Healthcare  Providers: GravelBags.it This test is not yet approved or cleared by the [place] FDA and  has been authorized for detection and/or diagnosis of SARS-CoV-2 by  FDA under an Emergency Use Authorization (EUA). This EUA will remain  in effect (meaning this test can be used) for the duration of the  Covid-19 declaration under Section 564(b)(1) of the Act, 21  U.S.C. section 360bbb-3(b)(1), unless the authorization is  terminated or revoked. Performed at [facility], [place] [address] [place], [place] [address]   Culture, blood (routine x 2)     Status: None   Collection Time: 10/21/19 11:38 AM   Specimen: BLOOD  Result Value Ref Range Status   Specimen Description   Final    BLOOD RIGHT ARM Performed at [facility] [address] [place], [place] [address]    Special Requests   Final    BOTTLES DRAWN AEROBIC AND ANAEROBIC Blood Culture adequate volume Performed at [facility] [address] [place], [place] [address]    Culture   Final    NO GROWTH 5 DAYS Performed at [facility], [place] [address], [place], [place] [address]    Report Status 10/26/2019 FINAL  Final  Culture, blood (routine x 2)     Status: None   Collection Time: 10/21/19 11:38 AM   Specimen: BLOOD  Result Value Ref Range Status   Specimen Description   Final    BLOOD LEFT ARM Performed at [facility] [address], [place], [place] [address]    Special Requests   Final    BOTTLES DRAWN AEROBIC ONLY Blood Culture adequate volume Performed at [facility] [address], [place], [place] [address]    Culture   Final    NO GROWTH 5 DAYS Performed at [facility], [place] [address], [place], [place] [address]    Report Status 10/26/2019 FINAL  Final  MRSA PCR Screening     Status: None   Collection Time: 10/22/19  6:04 PM   Specimen: Nasal Mucosa; Nasopharyngeal   Result Value Ref Range Status   MRSA by PCR NEGATIVE NEGATIVE Final    Comment:        The GeneXpert MRSA Assay (FDA approved for NASAL specimens only), is one component of a comprehensive MRSA colonization surveillance program. It is not intended to diagnose MRSA infection nor to guide or monitor treatment for MRSA infections. Performed at [facility], [place] [address], [place], [place] [address]   Culture, Urine     Status: None   Collection Time: 10/23/19 12:23 PM   Specimen: Urine, Clean Catch  Result Value Ref Range Status   Specimen Description   Final    URINE, CLEAN CATCH Performed at [facility], [place] [address], [place], [place] [address]    Special Requests   Final    Immunocompromised Performed at [facility], [place] [address], [place], [place] [address]    Culture   Final    NO GROWTH Performed at [facility], [place] [address] [place], [place] [address]    Report Status 10/24/2019 FINAL  Final      Studies: No results found.  Scheduled Meds: . amiodarone  100 mg Oral QHS  . Chlorhexidine Gluconate Cloth  6 each Topical Daily  . diltiazem  240 mg Oral QHS  . feeding supplement (ENSURE ENLIVE)  237 mL Oral BID BM  . ferrous gluconate  324 mg Oral TID WC  . FLUOROURACIL (ADRUCIL) CHEMO infusion For Inpatient Use  1,800 mg/m2 (Treatment Plan Recorded) Intravenous Once  . metoprolol succinate  25 mg Oral QHS  . rivaroxaban  20 mg Oral Q supper  . sodium chloride flush  10-40 mL Intracatheter Q12H  .  sodium chloride flush  3 mL Intravenous Q12H    Continuous Infusions: . sodium chloride       LOS: 7 days     Kayleen Memos, MD [facility] Pager [phone]  If 7PM-7AM, please contact night-coverage [email or web address] Password [email or web address] 10/27/2019, 1:18 PM

## 2019-10-27 NOTE — TOC Progression Note (Signed)
Transition of Care [facility]) - Progression Note    Patient Details  Name: Amber Medina MRN: [id number] Date of Birth: 01/01/1951  Transition of Care [facility]) CM/SW Contact  Tykia Mellone, Marjie Skiff, RN Phone Number: 10/27/2019, 2:36 PM  Clinical Narrative:    Pt given bed offer and she accepted bed at [facility]. Medicare.gov ratings provided to pt.  Liaison contacted and left voicemail with acceptance of bed. Pt for covid testing today.   Expected Discharge Plan: Skilled Nursing Facility Barriers to Discharge: SNF Pending bed offer  Expected Discharge Plan and Services Expected Discharge Plan: [place]   Discharge Planning Services: CM Consult Post Acute Care Choice: [facility] Living arrangements for the past 2 months: Single Family Home                                       Social Determinants of Health (SDOH) Interventions    Readmission Risk Interventions Readmission Risk Prevention Plan 08/22/2019  Transportation Screening Complete  PCP or Specialist Appt within 3-5 Days Not Complete  Not Complete comments Not ready  [place] or [place] Not Complete  HRI or Home Care Consult comments NA  Social Work Consult for [place] Planning/Counseling Not Complete  SW consult not completed comments NA  Palliative Care Screening Not Applicable  Medication Review Press photographer) Complete  Some recent data might be hidden

## 2019-10-27 NOTE — Progress Notes (Signed)
Physical Therapy Treatment Patient Details Name: Amber Medina MRN: [id number] DOB: Apr 27, 1951 Today's Date: 10/27/2019    History of Present Illness 69 year old female with history of chronic A. fib, chronic diastolic dysfunction, anemia, metastatic colon cancer currently on chemotherapy presented to the emergency room with episode of feeling dizzy when getting out of bed.  Patient also has progressive deconditioning and weakness.  She is on her fifth cycle of chemo.    PT Comments     Pt very emotional today, ranging from angry to tearful, which pt reports is due to recent news about terminal cancer. PT encouraged pt to mobilize at EOB, but pt requesting to return to bed after PT. PT also discussed pt goals with PT, and pt states her goals are to work on standing and keeping her strength. PT encouraged pt to perform ankle pumps and quad sets throughout the day as tolerated to work on circulation and LE strength, limited by pt tolerance. PT to continue to follow acutely.    Follow Up Recommendations  SNF     Equipment Recommendations  None recommended by PT    Recommendations for Other Services       Precautions / Restrictions Precautions Precautions: Fall Restrictions Weight Bearing Restrictions: No    Mobility  Bed Mobility Overal bed mobility: Needs Assistance Bed Mobility: Rolling;Supine to Sit;Sit to Supine Rolling: Mod assist   Supine to sit: Max assist;+2 for physical assistance;+2 for safety/equipment;HOB elevated Sit to supine: Max assist;+2 for physical assistance;+2 for safety/equipment;HOB elevated   General bed mobility comments: Mod assist for rolling onto L side with mod assist and use of bed pads; max assist +2 for supine<>sit with helicopter technique for LE and trunk assist.  Transfers Overall transfer level: Needs assistance Equipment used: Ambulation equipment used Transfers: Sit to/from Stand Sit to Stand: Max assist;+2 physical assistance;+2  safety/equipment;From elevated surface         General transfer comment: max assist +2 for power up, hip extension, steadying. Sit to stand x1 from bed for placement of stedy seat, then x1 more stand from stedy seat-height. Pt with sustained semi-stand for 30 seconds before fatiguing.  Ambulation/Gait             General Gait Details: NT   Stairs             Wheelchair Mobility    Modified Rankin (Stroke Patients Only)       Balance Overall balance assessment: Needs assistance Sitting-balance support: Feet supported;Bilateral upper extremity supported;Single extremity supported Sitting balance-Leahy Scale: Fair       Standing balance-Leahy Scale: Zero Standing balance comment: max +2 to stand, completely reliant on PT, aide, and stedy                            Cognition Arousal/Alertness: Awake/alert Behavior During Therapy: Restless;WFL for tasks assessed/performed Overall Cognitive Status: Within Functional Limits for tasks assessed                                 General Comments: Pt emotional today, initially irritated with PT stating "of for f---'s sake fine, let's get up" and threw her covers off. Pt then became tearful about cancer, stating she just got news that it is terminal.      Exercises General Exercises - Lower Extremity Ankle Circles/Pumps: AROM;Both;10 reps;Supine Quad Sets: AROM;Both;5 reps;Supine    General Comments General  comments (skin integrity, edema, etc.): DOE 3/4, improves with rest      Pertinent Vitals/Pain Pain Assessment: Faces Faces Pain Scale: Hurts even more Pain Location: feet Pain Descriptors / Indicators: Sore;Discomfort;Grimacing Pain Intervention(s): Monitored during session;Limited activity within patient's tolerance;Repositioned    Home Living                      Prior Function            PT Goals (current goals can now be found in the care plan section) Acute Rehab  PT Goals Patient Stated Goal: get moving again PT Goal Formulation: With patient Time For Goal Achievement: 11/05/19 Potential to Achieve Goals: Good Progress towards PT goals: Not progressing toward goals - comment(pt limited by fatigue and emotions today)    Frequency    Min 2X/week      PT Plan Current plan remains appropriate    Co-evaluation              AM-PAC PT "6 Clicks" Mobility   Outcome Measure  Help needed turning from your back to your side while in a flat bed without using bedrails?: A Lot Help needed moving from lying on your back to sitting on the side of a flat bed without using bedrails?: Total Help needed moving to and from a bed to a chair (including a wheelchair)?: Total Help needed standing up from a chair using your arms (e.g., wheelchair or bedside chair)?: Total Help needed to walk in hospital room?: Total Help needed climbing 3-5 steps with a railing? : Total 6 Click Score: 7    End of Session Equipment Utilized During Treatment: Gait belt Activity Tolerance: Patient limited by pain;Patient limited by fatigue Patient left: with call bell/phone within reach;in bed;with bed alarm set Nurse Communication: Mobility status PT Visit Diagnosis: Other abnormalities of gait and mobility (R26.89);Muscle weakness (generalized) (M62.81)     Time: [id number] PT Time Calculation (min) (ACUTE ONLY): 24 min  Charges:  $Therapeutic Activity: 23-37 mins                     Amber Medina E, PT Acute Rehabilitation Services Pager [phone]  Office [phone]    [place] Knaus D Averyana Pillars 10/27/2019, 12:08 PM

## 2019-10-27 NOTE — Progress Notes (Addendum)
HEMATOLOGY-ONCOLOGY PROGRESS NOTE  SUBJECTIVE: Started cycle 1 of FOLFIRI on 10/26/2019.  Avastin held to be given as an outpatient.  Reported diarrhea with irinotecan.  Imodium was effective.  Denies diarrhea this morning.  Denies nausea vomiting.  Denies mucositis.  Still has mild abdominal discomfort over her right upper quadrant.  Oncology History  Colon cancer metastasized to liver [facility])  08/17/2019 Initial Diagnosis   Colon cancer metastasized to liver [facility])   08/18/2019 - 10/12/2019 Chemotherapy   The patient had palonosetron (ALOXI) injection 0.25 mg, 0.25 mg, Intravenous,  Once, 4 of 12 cycles Administration: 0.25 mg (08/18/2019), 0.25 mg (08/30/2019), 0.25 mg (09/13/2019), 0.25 mg (09/27/2019) fluorouracil (ADRUCIL) 5,200 mg in sodium chloride 0.9 % 1,000 mL chemo infusion, 2,000 mg/m2 = 5,200 mg (83.3 % of original dose 2,400 mg/m2), Intravenous, Once, 1 of 1 cycle Dose modification: 2,000 mg/m2 (original dose 2,400 mg/m2, Cycle 1, Reason: Provider Judgment) Administration: 5,200 mg (08/18/2019) leucovorin 778 mg in dextrose 5 % 250 mL infusion, 300 mg/m2 = 778 mg (100 % of original dose 300 mg/m2), Intravenous,  Once, 4 of 12 cycles Dose modification: 300 mg/m2 (original dose 300 mg/m2, Cycle 1, Reason: Provider Judgment) Administration: 778 mg (08/18/2019), 778 mg (08/30/2019), 778 mg (09/13/2019), 778 mg (09/27/2019) oxaliplatin (ELOXATIN) 170 mg in dextrose 5 % 500 mL chemo infusion, 65 mg/m2 = 170 mg (100 % of original dose 65 mg/m2), Intravenous,  Once, 4 of 12 cycles Dose modification: 65 mg/m2 (original dose 65 mg/m2, Cycle 1, Reason: Provider Judgment) Administration: 170 mg (08/18/2019), 170 mg (08/30/2019), 170 mg (09/13/2019), 170 mg (09/27/2019) fluorouracil (ADRUCIL) 4,150 mg in sodium chloride 0.9 % 67 mL chemo infusion, 1,600 mg/m2 = 4,150 mg (80 % of original dose 2,000 mg/m2), Intravenous, 1 Day/Dose, 3 of 11 cycles Dose modification: 2,000 mg/m2 (original dose 2,000  mg/m2, Cycle 2, Reason: Provider Judgment), 1,600 mg/m2 (original dose 2,000 mg/m2, Cycle 2, Reason: Provider Judgment) Administration: 4,150 mg (08/30/2019), 4,150 mg (09/13/2019), 4,150 mg (09/27/2019)  for chemotherapy treatment.    10/26/2019 -  Chemotherapy   The patient had palonosetron (ALOXI) injection 0.25 mg, 0.25 mg, Intravenous,  Once, 1 of 4 cycles fluorouracil (ADRUCIL) 4,300 mg in sodium chloride 0.9 % 1,000 mL chemo infusion, 1,800 mg/m2 = 4,300 mg (100 % of original dose 1,800 mg/m2), Intravenous, Once, 1 of 1 cycle Dose modification: 1,800 mg/m2 (original dose 1,800 mg/m2, Cycle 1, Reason: Provider Judgment) irinotecan (CAMPTOSAR) 320 mg in sodium chloride 0.9 % 500 mL chemo infusion, 135 mg/m2 = 320 mg (100 % of original dose 135 mg/m2), Intravenous,  Once, 1 of 4 cycles Dose modification: 135 mg/m2 (original dose 135 mg/m2, Cycle 1, Reason: Provider Judgment) fluorouracil (ADRUCIL) 4,300 mg in sodium chloride 0.9 % 64 mL chemo infusion, 1,800 mg/m2 = 4,300 mg (100 % of original dose 1,800 mg/m2), Intravenous, 1 Day/Dose, 0 of 3 cycles Dose modification: 1,800 mg/m2 (original dose 1,800 mg/m2, Cycle 2, Reason: Provider Judgment) bevacizumab-bvzr (ZIRABEV) 600 mg in sodium chloride 0.9 % 100 mL chemo infusion, 5 mg/kg = 600 mg, Intravenous,  Once, 0 of 3 cycles leucovorin 718 mg in sodium chloride 0.9 % 250 mL infusion, 300 mg/m2 = 718 mg (100 % of original dose 300 mg/m2), Intravenous,  Once, 1 of 4 cycles Dose modification: 300 mg/m2 (original dose 300 mg/m2, Cycle 1, Reason: Provider Judgment)  for chemotherapy treatment.     PHYSICAL EXAMINATION:  Vitals:   10/26/19 2031 10/27/19 0514  BP: 129/70 124/66  Pulse: 67 64  Resp:  17 17  Temp: (!) 97.5 F (36.4 C)   SpO2: 93% 95%   Filed Weights   10/24/19 2100 10/26/19 0621 10/27/19 0500  Weight: 270 lb 1 oz (122.5 kg) 297 lb 12.8 oz (135.1 kg) (!) 300 lb 14.4 oz (136.5 kg)    Intake/Output from previous day: 01/20  0701 - 01/21 0700 In: 413.9 [I.V.:140; IV Piggyback:273.9] Out: 300 [Urine:300]  GENERAL:alert, no distress and comfortable OROPHARYNX: No thrush or mucositis  ABDOMEN: Marked hepatomegaly Musculoskeletal:no cyanosis of digits and no clubbing  NEURO: alert & oriented x 3 with fluent speech, no focal motor/sensory deficits  Port-A-Cath without erythema or edema  LABORATORY DATA:  I have reviewed the data as listed CMP Latest Ref Rng & Units 10/27/2019 10/26/2019 10/25/2019  Glucose 70 - 99 mg/dL 201(H) 124(H) 131(H)  BUN 8 - 23 mg/dL 25(H) 17 16  Creatinine 0.44 - 1.00 mg/dL 0.91 0.89 0.98  Sodium 135 - 145 mmol/L 136 136 136  Potassium 3.5 - 5.1 mmol/L 3.8 3.3(L) 3.2(L)  Chloride 98 - 111 mmol/L 103 104 103  CO2 22 - 32 mmol/L 22 24 24   Calcium 8.9 - 10.3 mg/dL 8.3(L) 8.3(L) 8.1(L)  Total Protein 6.5 - 8.1 g/dL 6.2(L) 5.8(L) 5.7(L)  Total Bilirubin 0.3 - 1.2 mg/dL 1.1 1.0 1.0  Alkaline Phos 38 - 126 U/L 278(H) 266(H) 296(H)  AST 15 - 41 U/L 20 27 29   ALT 0 - 44 U/L 13 13 13     Lab Results  Component Value Date   WBC 19.0 (H) 10/27/2019   HGB 8.9 (L) 10/27/2019   HCT 31.8 (L) 10/27/2019   MCV 97.8 10/27/2019   PLT 152 10/27/2019   NEUTROABS 17.4 (H) 10/27/2019    CT Angio Chest PE W and/or Wo Contrast  Result Date: 10/20/2019 CLINICAL DATA:  Dyspnea on exertion EXAM: CT ANGIOGRAPHY CHEST WITH CONTRAST TECHNIQUE: Multidetector CT imaging of the chest was performed using the standard protocol during bolus administration of intravenous contrast. Multiplanar CT image reconstructions and MIPs were obtained to evaluate the vascular anatomy. CONTRAST:  37m OMNIPAQUE IOHEXOL 350 MG/ML SOLN COMPARISON:  02/05/2018 and chest x-ray of the same date FINDINGS: Cardiovascular: Heart size is enlarged without pericardial effusion. Findings similar to the prior study. Aortic caliber 4 cm of the ascending thoracic aorta. No signs of pulmonary embolism. Mildly limited assessment of the lung  bases due to respiratory motion. Mediastinum/Nodes: Right-sided central venous access device terminates in the right atrium. Pre pericardial lymph node measuring 1.5 cm new from previous exams. No additional signs of mediastinal adenopathy. Esophagus grossly normal. Mildly nodular thyroid similar to prior study. Lungs/Pleura: Small pulmonary nodules scattered throughout the right chest some within some without calcification. New pulmonary nodule right lung apex (image 20, series 6) 6 mm New right upper lobe pulmonary nodule (image 61, series 6) 6 mm New pulmonary nodule in the right lower lobe (image 84, series 6) 6 mm. Signs of basilar atelectasis on the left. No signs of pleural effusion Small left upper lobe pulmonary nodule, also appears new compared to the previous study. Upper Abdomen: Signs of hepatic metastatic disease not well evaluated. Suggestion of diffuse infiltration of the left hepatic lobe. Small amount perisplenic fluid has developed also since the study of 08/13/2019. Findings suggest interval worsening of disease in the liver though again this areas not well evaluated currently. Bulky celiac lymph node (image 88, series 4) 3.3 cm in short axis unchanged. Musculoskeletal: No destructive bone process. No acute bone finding. Review of  the MIP images confirms the above findings. IMPRESSION: 1. Negative for pulmonary embolism. 2. Interval development of multiple bilateral pulmonary nodules, noncalcified findings are suspicious for metastatic disease. 3. Interval development of a 1.5 cm pre pericardial lymph node (image 134, series 5), suspicious for metastatic disease. 4. Signs of hepatic metastatic disease not well evaluated currently, but appearing worse than on the prior study. 5. Small amount of perisplenic fluid has developed since the previous study. 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 Aortic Atherosclerosis (ICD10-I70.0).  Electronically Signed   By: Zetta Bills M.D.   On: 10/20/2019 12:35   CT ABDOMEN PELVIS W CONTRAST  Result Date: 10/21/2019 CLINICAL DATA:  Metastatic colon cancer. Abdominal distension, weakness and diarrhea. EXAM: CT ABDOMEN AND PELVIS WITH CONTRAST TECHNIQUE: Multidetector CT imaging of the abdomen and pelvis was performed using the standard protocol following bolus administration of intravenous contrast. CONTRAST:  137m OMNIPAQUE IOHEXOL 300 MG/ML  SOLN COMPARISON:  Chest CT from yesterday and CT abdomen/pelvis 08/13/2019 FINDINGS: Lower chest: A few small pulmonary nodules are noted and consistent with metastatic disease as demonstrated on yesterday's chest CT. Streaky left basilar atelectasis. No effusions or infiltrates. The heart is normal in size. No pericardial effusion. Hepatobiliary: Severe and markedly progressive hepatic metastatic disease. There is now a large confluent mass lesion occupying the left hepatic lobe and measuring 14.5 x 12.0 cm on image 36/2. Numerous lesions are also noted throughout the right hepatic lobe and these have also enlarged. Exophytic tumor versus adjacent adenopathy near the left hepatic lobe on image number 28/2 measures 4 cm. This is unchanged in size. No biliary dilatation. Pancreas: No mass, inflammation or ductal dilatation. Spleen: Normal size.  No focal lesions. Adrenals/Urinary Tract: Stable bilateral adrenal gland nodules. These are likely benign adenomas. The kidneys are unremarkable. No renal lesions or hydronephrosis. The bladder is normal. Stomach/Bowel: The stomach, duodenum and small bowel are unremarkable. No mass lesions or obstructive findings. Stable surgical changes from a right hemicolectomy. Colonic diverticulosis but no findings for obstructing mass. Vascular/Lymphatic: Moderate scattered atherosclerotic calcifications involving the aorta and iliac arteries. No aneurysm or dissection. The major venous structures are patent. Enlarging omental  and peritoneal surface lesions. The largest omental lesion measures 3.9 cm on image 53/2. This previously measured 3.1 cm. Other smaller lesions have enlarged slightly also. No retroperitoneal lymphadenopathy. Reproductive: The uterus and ovaries are unremarkable. Other: No pelvic mass, pelvic adenopathy or free pelvic fluid collections. No inguinal mass or adenopathy. Musculoskeletal: No significant bony findings. IMPRESSION: 1. Significant progression of metastatic hepatic disease suggesting a very aggressive neoplasm. 2. Enlarging omental and peritoneal surface lesions. 3. Small pulmonary nodules consistent with pulmonary metastatic disease. Electronically Signed   By: PMarijo SanesM.D.   On: 10/21/2019 16:12   DG Chest Port 1 View  Result Date: 10/20/2019 CLINICAL DATA:  Dyspnea. EXAM: PORTABLE CHEST 1 VIEW COMPARISON:  08/13/2019 FINDINGS: The heart size and pulmonary vascularity are and the lungs clear. Aortic atherosclerosis. New power port in place. Tip is in superior vena cava just below the carina. No significant bone abnormality. IMPRESSION: 1. No active disease. 2.  Aortic Atherosclerosis (ICD10-I70.0). 3. Power port in good position. Electronically Signed   By: JLorriane ShireM.D.   On: 10/20/2019 10:26   ECHOCARDIOGRAM COMPLETE  Result Date: 10/21/2019   ECHOCARDIOGRAM REPORT   Patient Name:   JAUNICAPRICE  Date of Exam: 10/21/2019 Medical Rec #:  [id number] Height:  66.0 in Accession #:    [id number] Weight:       270.0 lb Date of Birth:  10-17-1950  BSA:          2.27 m Patient Age:    7 years   BP:           131/55 mmHg Patient Gender: F          HR:           108 bpm. Exam Location:  Inpatient Procedure: 2D Echo Indications:    Syncope 780.2 / R55  History:        Patient has prior history of Echocardiogram examinations, most                 recent 02/06/2018. CHF. Colon cancer. Chronic kidney disease.                 Anemia.  Sonographer:    Darlina Sicilian RDCS Referring Phys: [id number]  [place]  1. Left ventricular ejection fraction, by visual estimation, is 60 to 65%. The left ventricle has normal function. There is moderately increased left ventricular hypertrophy.  2. Left ventricular diastolic parameters are consistent with Grade II diastolic dysfunction (pseudonormalization).  3. Elevated left atrial pressure.  4. Global right ventricle has normal systolic function.The right ventricular size is normal.  5. Left atrial size was normal.  6. Right atrial size was normal.  7. The mitral valve is normal in structure. No evidence of mitral valve regurgitation.  8. The tricuspid valve is normal in structure.  9. The aortic valve was not well visualized. Aortic valve regurgitation is mild. Mild to moderate aortic valve sclerosis/calcification without any evidence of aortic stenosis. 10. The pulmonic valve was not well visualized. Pulmonic valve regurgitation is not visualized. 11. The inferior vena cava is normal in size with <50% respiratory variability, suggesting right atrial pressure of 8 mmHg. 12. TR signal is inadequate for assessing pulmonary artery systolic pressure. 13. There is dilatation of the ascending aorta measuring 41 mm. FINDINGS  Left Ventricle: Left ventricular ejection fraction, by visual estimation, is 60 to 65%. The left ventricle has normal function. The left ventricle has no regional wall motion abnormalities. There is moderately increased left ventricular hypertrophy. Left ventricular diastolic parameters are consistent with Grade II diastolic dysfunction (pseudonormalization). Elevated left atrial pressure. Right Ventricle: The right ventricular size is normal. No increase in right ventricular wall thickness. Global RV systolic function is has normal systolic function. Left Atrium: Left atrial size was normal in size. Right Atrium: Right atrial size was normal in size Pericardium: Trivial pericardial effusion is present. Mitral Valve: The mitral valve is  normal in structure. No evidence of mitral valve regurgitation. Tricuspid Valve: The tricuspid valve is normal in structure. Tricuspid valve regurgitation is not demonstrated. Aortic Valve: The aortic valve was not well visualized. Aortic valve regurgitation is mild. Aortic regurgitation PHT measures 465 msec. Mild to moderate aortic valve sclerosis/calcification is present, without any evidence of aortic stenosis. Pulmonic Valve: The pulmonic valve was not well visualized. Pulmonic valve regurgitation is not visualized. Pulmonic regurgitation is not visualized. Aorta: Aortic dilatation noted. There is dilatation of the ascending aorta measuring 41 mm. Venous: The inferior vena cava is normal in size with less than 50% respiratory variability, suggesting right atrial pressure of 8 mmHg. IAS/Shunts: The interatrial septum was not well visualized.  LEFT VENTRICLE PLAX 2D LVIDd:         3.70 cm  Diastology LVIDs:  2.30 cm  LV e' lateral:   5.66 cm/s LV PW:         1.30 cm  LV E/e' lateral: 17.2 LV IVS:        1.40 cm  LV e' medial:    5.77 cm/s LVOT diam:     2.00 cm  LV E/e' medial:  16.9 LV SV:         40 ml LV SV Index:   16.31 LVOT Area:     3.14 cm  RIGHT VENTRICLE RV S prime:     24.90 cm/s TAPSE (M-mode): 1.9 cm LEFT ATRIUM             Index       RIGHT ATRIUM           Index LA diam:        4.40 cm 1.94 cm/m  RA Area:     13.00 cm LA Vol (A2C):   58.9 ml 25.93 ml/m RA Volume:   29.50 ml  12.99 ml/m LA Vol (A4C):   64.8 ml 28.52 ml/m LA Biplane Vol: 68.0 ml 29.93 ml/m  AORTIC VALVE LVOT Vmax:   108.00 cm/s LVOT Vmean:  73.600 cm/s LVOT VTI:    0.169 m AI PHT:      465 msec  AORTA Ao Root diam: 3.10 cm Ao Asc diam:  4.10 cm MITRAL VALVE MV Area (PHT): 4.36 cm              SHUNTS MV PHT:        50.46 msec            Systemic VTI:  0.17 m MV Decel Time: 174 msec              Systemic Diam: 2.00 cm MV E velocity: 97.40 cm/s  103 cm/s MV A velocity: 106.00 cm/s 70.3 cm/s MV E/A ratio:  0.92        1.5   Oswaldo Milian MD Electronically signed by Oswaldo Milian MD Signature Date/Time: 10/21/2019/7:54:01 PM    Final     ASSESSMENT AND PLAN: 1. 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), right colectomy 04/22/2018  Ascending colon, moderately differentiated mucinous adenocarcinoma, no lymphovascular perineural invasion, no tumor deposits, tumor budding-low, 0/14 lymph nodes, tumor invades into the subserosa and is focally present at the inked serosal surface (pT4), no loss of mismatch repair protein expression  Colonoscopy 06/13/2017-ascending colon mass-adenocarcinoma, polyps in the ascending and sigmoid-tubular adenomas  CT abdomen/pelvis 08/13/2019-multiple peritoneal masses compatible with metastatic disease, multiple hypodense liver lesions consistent with metastatic disease  CEA 08/13/2019 -806.0  CT biopsy of a peritoneal mass 08/15/2019, metastatic adenocarcinoma consistent with colon cancer  MSS, tumor mutation burden 5, KRAS G12V ? Cycle 1 FOLFOX 08/18/2019  ? Cycle 2 FOLFOX 08/30/2019-5-FU dose reduced secondary to mucositis ? Cycle 3 FOLFOX 09/13/2019  CT of the abdomen pelvis on 10/21/2019-significant progression of metastatic hepatic disease suggesting a very aggressive neoplasm, enlarging omental and peritoneal surface lesions, small pulmonary nodules consistent with pulmonary metastatic disease  Cycle 1 FOLFIRI 10/26/2019 2.Acute pulmonary embolism and left popliteal DVT 02/05/2018 3.Iron deficiency anemia September 2018 4.Atrial fibrillation 5.Congestive heart failure 6.Hypertension 7.Morbid obesity 8.Renal failure 9.  Hospital admission 10/20/2019-near syncope, anemia, leukocytosis  Amber Medina appears unchanged.  Started FOLFIRI on 10/26/2019.  She tolerated it well with the exception of diarrhea which has now resolved.  Avastin will be held from the first cycle due to cost of this agent in a hospitalized patient.  White  blood cell count is trending downward and  hemoglobin remained stable.  Recommendations: 1.  Continue 5-FU infusion which will be completed tomorrow. 2.  Continue Imodium as needed for diarrhea. 3.  Awaiting SNF placement.  From our standpoint, Amber Medina may be discharged once her chemotherapy is complete and she has a bed offer at [facility].  We will arrange for outpatient follow-up for her at the cancer center.   LOS: 7 days   Mikey Bussing, DNP, AGPCNP-BC, AOCNP 10/27/19  Amber Medina was interviewed and examined.  She developed diarrhea following irinotecan yesterday.  The diarrhea improved with atropine.  The 5-fluorouracil infusion continues.  Outpatient follow-up will be scheduled for cycle 2 FOLFIRI.

## 2019-10-28 ENCOUNTER — Other Ambulatory Visit: Payer: Self-pay | Admitting: Oncology

## 2019-10-28 DIAGNOSIS — C189 Malignant neoplasm of colon, unspecified: Secondary | ICD-10-CM

## 2019-10-28 MED ORDER — LORAZEPAM 0.5 MG PO TABS: 0.5000 mg | ORAL_TABLET | Freq: Every day | ORAL | Status: DC | PRN

## 2019-10-28 MED ORDER — AMIODARONE HCL 100 MG PO TABS: 100.0000 mg | ORAL_TABLET | Freq: Every day | ORAL | 0 refills | Status: AC

## 2019-10-28 MED ORDER — LORAZEPAM 0.5 MG PO TABS: 0.5000 mg | ORAL_TABLET | Freq: Every day | ORAL | 0 refills | Status: AC | PRN

## 2019-10-28 MED ORDER — HEPARIN SOD (PORK) LOCK FLUSH 100 UNIT/ML IV SOLN
500.0000 [IU] | Freq: Once | INTRAVENOUS | Status: AC
  Administered 2019-10-28: 13:00:00 500 [IU] via INTRAVENOUS
  Filled 2019-10-28: qty 5

## 2019-10-28 MED ORDER — LOPERAMIDE HCL 2 MG PO CAPS: 2.0000 mg | ORAL_CAPSULE | ORAL | 0 refills | Status: AC | PRN

## 2019-10-28 NOTE — Progress Notes (Addendum)
HEMATOLOGY-ONCOLOGY PROGRESS NOTE  SUBJECTIVE: 5-FU infusion continues until complete later this morning.  Reports some intermittent diarrhea overnight which has now resolved.  Did not need Imodium.  Denies diarrhea this morning.  Denies nausea vomiting.  Denies mucositis.  Still has mild abdominal discomfort over her right upper quadrant.  The patient will be discharging to a skilled nursing facility later today.  Oncology History  Colon cancer metastasized to liver [facility])  08/17/2019 Initial Diagnosis   Colon cancer metastasized to liver [facility])   08/18/2019 - 10/12/2019 Chemotherapy   The patient had palonosetron (ALOXI) injection 0.25 mg, 0.25 mg, Intravenous,  Once, 4 of 12 cycles Administration: 0.25 mg (08/18/2019), 0.25 mg (08/30/2019), 0.25 mg (09/13/2019), 0.25 mg (09/27/2019) fluorouracil (ADRUCIL) 5,200 mg in sodium chloride 0.9 % 1,000 mL chemo infusion, 2,000 mg/m2 = 5,200 mg (83.3 % of original dose 2,400 mg/m2), Intravenous, Once, 1 of 1 cycle Dose modification: 2,000 mg/m2 (original dose 2,400 mg/m2, Cycle 1, Reason: Provider Judgment) Administration: 5,200 mg (08/18/2019) leucovorin 778 mg in dextrose 5 % 250 mL infusion, 300 mg/m2 = 778 mg (100 % of original dose 300 mg/m2), Intravenous,  Once, 4 of 12 cycles Dose modification: 300 mg/m2 (original dose 300 mg/m2, Cycle 1, Reason: Provider Judgment) Administration: 778 mg (08/18/2019), 778 mg (08/30/2019), 778 mg (09/13/2019), 778 mg (09/27/2019) oxaliplatin (ELOXATIN) 170 mg in dextrose 5 % 500 mL chemo infusion, 65 mg/m2 = 170 mg (100 % of original dose 65 mg/m2), Intravenous,  Once, 4 of 12 cycles Dose modification: 65 mg/m2 (original dose 65 mg/m2, Cycle 1, Reason: Provider Judgment) Administration: 170 mg (08/18/2019), 170 mg (08/30/2019), 170 mg (09/13/2019), 170 mg (09/27/2019) fluorouracil (ADRUCIL) 4,150 mg in sodium chloride 0.9 % 67 mL chemo infusion, 1,600 mg/m2 = 4,150 mg (80 % of original dose 2,000 mg/m2), Intravenous, 1  Day/Dose, 3 of 11 cycles Dose modification: 2,000 mg/m2 (original dose 2,000 mg/m2, Cycle 2, Reason: Provider Judgment), 1,600 mg/m2 (original dose 2,000 mg/m2, Cycle 2, Reason: Provider Judgment) Administration: 4,150 mg (08/30/2019), 4,150 mg (09/13/2019), 4,150 mg (09/27/2019)  for chemotherapy treatment.    10/26/2019 -  Chemotherapy   The patient had palonosetron (ALOXI) injection 0.25 mg, 0.25 mg, Intravenous,  Once, 1 of 4 cycles fluorouracil (ADRUCIL) 4,300 mg in sodium chloride 0.9 % 1,000 mL chemo infusion, 1,800 mg/m2 = 4,300 mg (100 % of original dose 1,800 mg/m2), Intravenous, Once, 1 of 1 cycle Dose modification: 1,800 mg/m2 (original dose 1,800 mg/m2, Cycle 1, Reason: Provider Judgment) irinotecan (CAMPTOSAR) 320 mg in sodium chloride 0.9 % 500 mL chemo infusion, 135 mg/m2 = 320 mg (100 % of original dose 135 mg/m2), Intravenous,  Once, 1 of 4 cycles Dose modification: 135 mg/m2 (original dose 135 mg/m2, Cycle 1, Reason: Provider Judgment) fluorouracil (ADRUCIL) 4,300 mg in sodium chloride 0.9 % 64 mL chemo infusion, 1,800 mg/m2 = 4,300 mg (100 % of original dose 1,800 mg/m2), Intravenous, 1 Day/Dose, 0 of 3 cycles Dose modification: 1,800 mg/m2 (original dose 1,800 mg/m2, Cycle 2, Reason: Provider Judgment) bevacizumab-bvzr (ZIRABEV) 600 mg in sodium chloride 0.9 % 100 mL chemo infusion, 5 mg/kg = 600 mg, Intravenous,  Once, 0 of 3 cycles leucovorin 718 mg in sodium chloride 0.9 % 250 mL infusion, 300 mg/m2 = 718 mg (100 % of original dose 300 mg/m2), Intravenous,  Once, 1 of 4 cycles Dose modification: 300 mg/m2 (original dose 300 mg/m2, Cycle 1, Reason: Provider Judgment)  for chemotherapy treatment.     PHYSICAL EXAMINATION:  Vitals:   10/27/19 2018 10/28/19  6759  BP: (!) 119/55 110/65  Pulse: 66 68  Resp: 19 19  Temp: 97.8 F (36.6 C) (!) 97.5 F (36.4 C)  SpO2: 97% 94%   Filed Weights   10/26/19 0621 10/27/19 0500 10/28/19 0633  Weight: 297 lb 12.8 oz (135.1 kg)  (!) 300 lb 14.4 oz (136.5 kg) (!) 302 lb 9.6 oz (137.3 kg)    Intake/Output from previous day: 01/21 0701 - 01/22 0700 In: 1502.6 [I.V.:801.1; IV Piggyback:701.5] Out: 350 [Urine:350]  GENERAL:alert, no distress and comfortable OROPHARYNX: No thrush or mucositis ABDOMEN: Marked hepatomegaly Musculoskeletal:no cyanosis of digits and no clubbing  NEURO: alert & oriented x 3 with fluent speech, no focal motor/sensory deficits  Port-A-Cath without erythema or edema  LABORATORY DATA:  I have reviewed the data as listed CMP Latest Ref Rng & Units 10/27/2019 10/26/2019 10/25/2019  Glucose 70 - 99 mg/dL 201(H) 124(H) 131(H)  BUN 8 - 23 mg/dL 25(H) 17 16  Creatinine 0.44 - 1.00 mg/dL 0.91 0.89 0.98  Sodium 135 - 145 mmol/L 136 136 136  Potassium 3.5 - 5.1 mmol/L 3.8 3.3(L) 3.2(L)  Chloride 98 - 111 mmol/L 103 104 103  CO2 22 - 32 mmol/L 22 24 24   Calcium 8.9 - 10.3 mg/dL 8.3(L) 8.3(L) 8.1(L)  Total Protein 6.5 - 8.1 g/dL 6.2(L) 5.8(L) 5.7(L)  Total Bilirubin 0.3 - 1.2 mg/dL 1.1 1.0 1.0  Alkaline Phos 38 - 126 U/L 278(H) 266(H) 296(H)  AST 15 - 41 U/L 20 27 29   ALT 0 - 44 U/L 13 13 13     Lab Results  Component Value Date   WBC 19.0 (H) 10/27/2019   HGB 8.9 (L) 10/27/2019   HCT 31.8 (L) 10/27/2019   MCV 97.8 10/27/2019   PLT 152 10/27/2019   NEUTROABS 17.4 (H) 10/27/2019    CT Angio Chest PE W and/or Wo Contrast  Result Date: 10/20/2019 CLINICAL DATA:  Dyspnea on exertion EXAM: CT ANGIOGRAPHY CHEST WITH CONTRAST TECHNIQUE: Multidetector CT imaging of the chest was performed using the standard protocol during bolus administration of intravenous contrast. Multiplanar CT image reconstructions and MIPs were obtained to evaluate the vascular anatomy. CONTRAST:  38m OMNIPAQUE IOHEXOL 350 MG/ML SOLN COMPARISON:  02/05/2018 and chest x-ray of the same date FINDINGS: Cardiovascular: Heart size is enlarged without pericardial effusion. Findings similar to the prior study. Aortic caliber 4 cm  of the ascending thoracic aorta. No signs of pulmonary embolism. Mildly limited assessment of the lung bases due to respiratory motion. Mediastinum/Nodes: Right-sided central venous access device terminates in the right atrium. Pre pericardial lymph node measuring 1.5 cm new from previous exams. No additional signs of mediastinal adenopathy. Esophagus grossly normal. Mildly nodular thyroid similar to prior study. Lungs/Pleura: Small pulmonary nodules scattered throughout the right chest some within some without calcification. New pulmonary nodule right lung apex (image 20, series 6) 6 mm New right upper lobe pulmonary nodule (image 61, series 6) 6 mm New pulmonary nodule in the right lower lobe (image 84, series 6) 6 mm. Signs of basilar atelectasis on the left. No signs of pleural effusion Small left upper lobe pulmonary nodule, also appears new compared to the previous study. Upper Abdomen: Signs of hepatic metastatic disease not well evaluated. Suggestion of diffuse infiltration of the left hepatic lobe. Small amount perisplenic fluid has developed also since the study of 08/13/2019. Findings suggest interval worsening of disease in the liver though again this areas not well evaluated currently. Bulky celiac lymph node (image 88, series 4) 3.3 cm  in short axis unchanged. Musculoskeletal: No destructive bone process. No acute bone finding. Review of the MIP images confirms the above findings. IMPRESSION: 1. Negative for pulmonary embolism. 2. Interval development of multiple bilateral pulmonary nodules, noncalcified findings are suspicious for metastatic disease. 3. Interval development of a 1.5 cm pre pericardial lymph node (image 134, series 5), suspicious for metastatic disease. 4. Signs of hepatic metastatic disease not well evaluated currently, but appearing worse than on the prior study. 5. Small amount of perisplenic fluid has developed since the previous study. 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 Aortic Atherosclerosis (ICD10-I70.0). Electronically Signed   By: Zetta Bills M.D.   On: 10/20/2019 12:35   CT ABDOMEN PELVIS W CONTRAST  Result Date: 10/21/2019 CLINICAL DATA:  Metastatic colon cancer. Abdominal distension, weakness and diarrhea. EXAM: CT ABDOMEN AND PELVIS WITH CONTRAST TECHNIQUE: Multidetector CT imaging of the abdomen and pelvis was performed using the standard protocol following bolus administration of intravenous contrast. CONTRAST:  151m OMNIPAQUE IOHEXOL 300 MG/ML  SOLN COMPARISON:  Chest CT from yesterday and CT abdomen/pelvis 08/13/2019 FINDINGS: Lower chest: A few small pulmonary nodules are noted and consistent with metastatic disease as demonstrated on yesterday's chest CT. Streaky left basilar atelectasis. No effusions or infiltrates. The heart is normal in size. No pericardial effusion. Hepatobiliary: Severe and markedly progressive hepatic metastatic disease. There is now a large confluent mass lesion occupying the left hepatic lobe and measuring 14.5 x 12.0 cm on image 36/2. Numerous lesions are also noted throughout the right hepatic lobe and these have also enlarged. Exophytic tumor versus adjacent adenopathy near the left hepatic lobe on image number 28/2 measures 4 cm. This is unchanged in size. No biliary dilatation. Pancreas: No mass, inflammation or ductal dilatation. Spleen: Normal size.  No focal lesions. Adrenals/Urinary Tract: Stable bilateral adrenal gland nodules. These are likely benign adenomas. The kidneys are unremarkable. No renal lesions or hydronephrosis. The bladder is normal. Stomach/Bowel: The stomach, duodenum and small bowel are unremarkable. No mass lesions or obstructive findings. Stable surgical changes from a right hemicolectomy. Colonic diverticulosis but no findings for obstructing mass. Vascular/Lymphatic: Moderate scattered atherosclerotic calcifications involving the aorta and iliac  arteries. No aneurysm or dissection. The major venous structures are patent. Enlarging omental and peritoneal surface lesions. The largest omental lesion measures 3.9 cm on image 53/2. This previously measured 3.1 cm. Other smaller lesions have enlarged slightly also. No retroperitoneal lymphadenopathy. Reproductive: The uterus and ovaries are unremarkable. Other: No pelvic mass, pelvic adenopathy or free pelvic fluid collections. No inguinal mass or adenopathy. Musculoskeletal: No significant bony findings. IMPRESSION: 1. Significant progression of metastatic hepatic disease suggesting a very aggressive neoplasm. 2. Enlarging omental and peritoneal surface lesions. 3. Small pulmonary nodules consistent with pulmonary metastatic disease. Electronically Signed   By: PMarijo SanesM.D.   On: 10/21/2019 16:12   DG Chest Port 1 View  Result Date: 10/20/2019 CLINICAL DATA:  Dyspnea. EXAM: PORTABLE CHEST 1 VIEW COMPARISON:  08/13/2019 FINDINGS: The heart size and pulmonary vascularity are and the lungs clear. Aortic atherosclerosis. New power port in place. Tip is in superior vena cava just below the carina. No significant bone abnormality. IMPRESSION: 1. No active disease. 2.  Aortic Atherosclerosis (ICD10-I70.0). 3. Power port in good position. Electronically Signed   By: JLorriane ShireM.D.   On: 10/20/2019 10:26   ECHOCARDIOGRAM COMPLETE  Result Date: 10/21/2019   ECHOCARDIOGRAM REPORT   Patient Name:   JGEOVANAPRICE  Date of Exam: 10/21/2019 Medical Rec #:  [id number]  Height:       66.0 in Accession #:    [id number] Weight:       270.0 lb Date of Birth:  May 28, 1951  BSA:          2.27 m Patient Age:    28 years   BP:           131/55 mmHg Patient Gender: F          HR:           108 bpm. Exam Location:  Inpatient Procedure: 2D Echo Indications:    Syncope 780.2 / R55  History:        Patient has prior history of Echocardiogram examinations, most                 recent 02/06/2018. CHF. Colon cancer. Chronic kidney  disease.                 Anemia.  Sonographer:    Darlina Sicilian RDCS Referring Phys: [id number] [place]  1. Left ventricular ejection fraction, by visual estimation, is 60 to 65%. The left ventricle has normal function. There is moderately increased left ventricular hypertrophy.  2. Left ventricular diastolic parameters are consistent with Grade II diastolic dysfunction (pseudonormalization).  3. Elevated left atrial pressure.  4. Global right ventricle has normal systolic function.The right ventricular size is normal.  5. Left atrial size was normal.  6. Right atrial size was normal.  7. The mitral valve is normal in structure. No evidence of mitral valve regurgitation.  8. The tricuspid valve is normal in structure.  9. The aortic valve was not well visualized. Aortic valve regurgitation is mild. Mild to moderate aortic valve sclerosis/calcification without any evidence of aortic stenosis. 10. The pulmonic valve was not well visualized. Pulmonic valve regurgitation is not visualized. 11. The inferior vena cava is normal in size with <50% respiratory variability, suggesting right atrial pressure of 8 mmHg. 12. TR signal is inadequate for assessing pulmonary artery systolic pressure. 13. There is dilatation of the ascending aorta measuring 41 mm. FINDINGS  Left Ventricle: Left ventricular ejection fraction, by visual estimation, is 60 to 65%. The left ventricle has normal function. The left ventricle has no regional wall motion abnormalities. There is moderately increased left ventricular hypertrophy. Left ventricular diastolic parameters are consistent with Grade II diastolic dysfunction (pseudonormalization). Elevated left atrial pressure. Right Ventricle: The right ventricular size is normal. No increase in right ventricular wall thickness. Global RV systolic function is has normal systolic function. Left Atrium: Left atrial size was normal in size. Right Atrium: Right atrial size was normal in  size Pericardium: Trivial pericardial effusion is present. Mitral Valve: The mitral valve is normal in structure. No evidence of mitral valve regurgitation. Tricuspid Valve: The tricuspid valve is normal in structure. Tricuspid valve regurgitation is not demonstrated. Aortic Valve: The aortic valve was not well visualized. Aortic valve regurgitation is mild. Aortic regurgitation PHT measures 465 msec. Mild to moderate aortic valve sclerosis/calcification is present, without any evidence of aortic stenosis. Pulmonic Valve: The pulmonic valve was not well visualized. Pulmonic valve regurgitation is not visualized. Pulmonic regurgitation is not visualized. Aorta: Aortic dilatation noted. There is dilatation of the ascending aorta measuring 41 mm. Venous: The inferior vena cava is normal in size with less than 50% respiratory variability, suggesting right atrial pressure of 8 mmHg. IAS/Shunts: The interatrial septum was not well visualized.  LEFT VENTRICLE  PLAX 2D LVIDd:         3.70 cm  Diastology LVIDs:         2.30 cm  LV e' lateral:   5.66 cm/s LV PW:         1.30 cm  LV E/e' lateral: 17.2 LV IVS:        1.40 cm  LV e' medial:    5.77 cm/s LVOT diam:     2.00 cm  LV E/e' medial:  16.9 LV SV:         40 ml LV SV Index:   16.31 LVOT Area:     3.14 cm  RIGHT VENTRICLE RV S prime:     24.90 cm/s TAPSE (M-mode): 1.9 cm LEFT ATRIUM             Index       RIGHT ATRIUM           Index LA diam:        4.40 cm 1.94 cm/m  RA Area:     13.00 cm LA Vol (A2C):   58.9 ml 25.93 ml/m RA Volume:   29.50 ml  12.99 ml/m LA Vol (A4C):   64.8 ml 28.52 ml/m LA Biplane Vol: 68.0 ml 29.93 ml/m  AORTIC VALVE LVOT Vmax:   108.00 cm/s LVOT Vmean:  73.600 cm/s LVOT VTI:    0.169 m AI PHT:      465 msec  AORTA Ao Root diam: 3.10 cm Ao Asc diam:  4.10 cm MITRAL VALVE MV Area (PHT): 4.36 cm              SHUNTS MV PHT:        50.46 msec            Systemic VTI:  0.17 m MV Decel Time: 174 msec              Systemic Diam: 2.00 cm MV E  velocity: 97.40 cm/s  103 cm/s MV A velocity: 106.00 cm/s 70.3 cm/s MV E/A ratio:  0.92        1.5  Oswaldo Milian MD Electronically signed by Oswaldo Milian MD Signature Date/Time: 10/21/2019/7:54:01 PM    Final     ASSESSMENT AND PLAN: 1. 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), right colectomy 04/22/2018  Ascending colon, moderately differentiated mucinous adenocarcinoma, no lymphovascular perineural invasion, no tumor deposits, tumor budding-low, 0/14 lymph nodes, tumor invades into the subserosa and is focally present at the inked serosal surface (pT4), no loss of mismatch repair protein expression  Colonoscopy 06/13/2017-ascending colon mass-adenocarcinoma, polyps in the ascending and sigmoid-tubular adenomas  CT abdomen/pelvis 08/13/2019-multiple peritoneal masses compatible with metastatic disease, multiple hypodense liver lesions consistent with metastatic disease  CEA 08/13/2019 -806.0  CT biopsy of a peritoneal mass 08/15/2019, metastatic adenocarcinoma consistent with colon cancer  MSS, tumor mutation burden 5, KRAS G12V ? Cycle 1 FOLFOX 08/18/2019  ? Cycle 2 FOLFOX 08/30/2019-5-FU dose reduced secondary to mucositis ? Cycle 3 FOLFOX 09/13/2019  CT of the abdomen pelvis on 10/21/2019-significant progression of metastatic hepatic disease suggesting a very aggressive neoplasm, enlarging omental and peritoneal surface lesions, small pulmonary nodules consistent with pulmonary metastatic disease  Cycle 1 FOLFIRI 10/26/2019 2.Acute pulmonary embolism and left popliteal DVT 02/05/2018 3.Iron deficiency anemia September 2018 4.Atrial fibrillation 5.Congestive heart failure 6.Hypertension 7.Morbid obesity 8.Renal failure 9.  Hospital admission 10/20/2019-near syncope, anemia, leukocytosis  Amber Medina appears unchanged.  Started FOLFIRI on 10/26/2019 and has been tolerating her chemotherapy well with the exception of  diarrhea which has now resolved.  Avastin will be held from  the first cycle due to cost of this agent in a hospitalized patient.  White blood cell count is trending downward and hemoglobin remains stable.  Recommendations: 1.  She will complete her 5-FU infusion today as scheduled. 2.  Continue Imodium as needed for diarrhea. 3.  She will discharge later today to a skilled nursing facility.  Outpatient follow-up has been arranged for her second cycle of chemotherapy which will be given on 11/10/2019.  From our standpoint, Amber Medina may be discharged once her chemotherapy is complete.    LOS: 8 days   Mikey Bussing, DNP, AGPCNP-BC, AOCNP 10/28/19  Amber Medina was interviewed and examined.  She appears stable.  She will complete the 5-FU infusion and then be discharged to a skilled nursing facility today.  Outpatient follow-up is scheduled at the Cancer center for cycle 2 of FOLFIRI on 11/10/2019.  Her daughter was present at the bedside today.  I discussed the current treatment plan with her daughter.

## 2019-10-28 NOTE — Discharge Instructions (Signed)
Chemotherapy Chemotherapy is a cancer treatment. It uses medicines to slow down or stop the growth of cancer. You may have chemotherapy to:  Cure your cancer.  Prevent the cancer from growing or spreading (metastasizing).  Ease symptoms and improve your quality of life (palliative care).  Improve the effects of radiation treatment.  Shrink a tumor before surgery.  Rid the body of cancer cells that remain after having a tumor surgically removed. The length of chemotherapy treatment depends on many factors, including:  The type and stage of your cancer.  How you respond to the chemotherapy.  Your side effects. What are the risks? Generally, this is a safe treatment. However, problems may occur, including:  Infection.  Bleeding at the IV site.  Allergic reactions to medicines. You may have side effects from chemotherapy. What side effects you have depends on a variety of factors, including:  The type of chemotherapy medicine used.  Your dosage.  How long the medicine is used for.  Your overall health. What happens before treatment?  You will meet with your cancer care team to discuss: ? How your chemotherapy medicine will be given. ? Common side effects and how to manage them. ? Your treatment schedule.  You may have blood tests.  You may be given medicine to help prevent common side effects. What happens during treatment? Chemotherapy may be given continuously over time, or it may be given in cycles. Some common ways chemotherapy may be given include:  As a pill or capsule.  As an injection.  As a skin (topical) cream.  As a special wafer that is put in your body where the cancer is.  As an injection into the cerebrospinal fluid (CSF) in the brain or spinal cord (intraventricular or intrathecal chemotherapy).  Through a small, thin tube (catheter). There are different kinds of catheters. You might have one that: ? Goes into a vein (intravenous  catheter). ? Connects to a device (port) that is inserted under the skin of your chest (port catheter). A port catheter connects the port to a large vein in your chest or upper arm. The port may stay in place for many weeks or months. ? Goes into a vein near your elbow (PICC line). This may be used for weeks or months. ? Goes into a vein in your neck that leads to your heart (non-tunneled catheter). This catheter has a risk of infection, so it is used for only a short time. ? Goes through the skin of your chest and into a large vein that leads to your heart (tunneled catheter). This catheter can stay in your body for months or years. While you are receiving your medicine, your cancer care team will monitor your blood pressure, heart rate, breathing rate, and blood oxygen level (vital signs) and watch for any problems. Some types of chemotherapy medicine are given only one time. Others are given for months, years, or for life. What can I expect after treatment? After chemotherapy, you may have side effects, such as:  Nausea and vomiting.  Appetite loss.  Constipation or diarrhea.  Fatigue.  Increased risk of infections, bruising, or bleeding.  Hair loss.  Mouth or throat sores.  Tingling, pain, or numbness in the hands and feet.  Dry, sensitive, itchy, or sore skin.  Memory changes. Follow these instructions at home: General instructions   If you get chemotherapy through an IV, PICC line, or port, check the site every day for signs of infection. Check for redness, swelling, pain,  fluid, or warmth.  Wash your hands frequently with soap and water. If soap and water are not available, use hand sanitizer. Have other members of your household wash their hands often.  Chemotherapy medicines leave the body through urine or stool (feces), but they can also be present in other body fluids including vomit, tears, vaginal fluids, and semen. You must carefully follow some safety precautions  to prevent harm to others while you are taking these medicines: ? Wash any clothes, towels, and linens that may have your bodily fluids on them twice in a washing machine. ? Use a condom during vaginal, anal, and oral sex while you are taking chemotherapy medicines and for 48 hours after your last dose. ? Practice good bathroom hygiene:  Always sit when using the toilet. Close the toilet seat lid before you flush.  Wash your hands thoroughly with soap and water after each time you use the toilet.  Keep all follow-up visits as told by your cancer care team. This is important. Eating and drinking  Talk with a dietitian about what you should eat and drink during cancer treatment.  Always wash fresh fruits and vegetables well before eating them.  Drink enough fluid to keep urine pale yellow. Medicines  Take over-the-counter and prescription medicines only as told by your health care provider.  Talk with your health care provider before taking vitamins and supplements. Some can interfere with chemotherapy. Activity  Get plenty of rest.  Get regular exercise such as walking, gentle yoga, or tai chi.  Return to your normal activities as told by your health care provider. Ask your health care provider what activities are safe for you. Contact a health care provider if:  You have: ? A skin rash that does not go away. ? A headache. ? A stiff neck. ? A cough. ? Cold or flu symptoms. ? A burning feeling when urinating. ? Urine that smells bad. ? Diarrhea. ? Nausea. ? Vomiting. ? Blood in your urine or stool.  You bleed or bruise often.  You urinate more frequently than usual.  You cannot eat because of mouth or throat pain. Get help right away if:  You have: ? A fever. ? Redness, swelling, pain, fluid, or warmth near your IV site. ? Bleeding that does not stop. ? A seizure. ? Chest pain. ? Difficulty breathing.  You cannot swallow. Summary  Chemotherapy is a way to  treat cancer. It uses medicines to slow down or stop the growth of cancer.  Before treatment, you and your cancer care team will discuss common side effects and how to manage them.  The way that you will get chemotherapy medicines depends on your condition.  Take over-the-counter and prescription medicines only as told by your health care provider. This information is not intended to replace advice given to you by your health care provider. Make sure you discuss any questions you have with your health care provider. Document Revised: 01/12/2019 Document Reviewed: 07/09/2017 Elsevier Patient Education  [place], Adult  A colon mass is an abnormal growth in the large intestine (colon). A mass can cause a blockage (obstruction) or bleeding in the colon. What are the causes? This condition may be caused by:  Cancer.  Blood vessel problems.  Infection.  Inflammatory bowel disease (IBD).  An inflammation or infection of small pouches in the colon (diverticulitis).  A condition in which the lining of the uterus grows outside of the uterus (endometriosis). What are the signs  or symptoms? Symptoms of this condition include:  Cramping.  Nausea.  Diarrhea.  Fever.  Vomiting.  Feeling weak.  Pain in the abdomen, side, or back.  Weight loss.  Constipation.  Bleeding from the rectum.  Loss of appetite.  Changes in bowel habits.  Feeling the need for a bowel movement after you have had one recently. In some cases, there are no symptoms. How is this diagnosed?     This condition may be diagnosed based on tests and procedures that are done to learn more about the mass. These may include:  Blood tests.  X-rays.  Ultrasound.  CT scan.  MRI.  Colonoscopy. This is a procedure in which a lubricated, flexible tube is inserted into the anus and then passed into the rectum and colon to examine the areas.  Removal of a tissue sample from the mass  to be examined (biopsy). A biopsy may be taken during a colonoscopy. In some cases, what seems like a colon mass may actually be something else, such as scarring that formed after a surgery or an ulcer. How is this treated? Treatment depends on the cause of the mass. You and your health care provider will discuss the meaning of your test results and the recommended steps for starting treatment. If your case is serious, you may need emergency surgery. Follow these instructions at home: What you need to do at home depends on the cause of the mass. Follow instructions from your health care provider. In general:  Take over-the-counter and prescription medicines only as told by your health care provider.  Keep all follow-up visits as told by your health care provider. This is important. Contact a health care provider if:  Your symptoms get worse.  You have new symptoms.  You have a fever.  Your pain does not get better with medicine.  You feel weaker.  You bruise or bleed easily. Get help right away if:  You vomit bright red blood or material that looks like coffee grounds.  You have blood in your stools (feces), or your stools look black and tarry.  You faint.  You feel that the mass has suddenly gotten larger.  You have severe swelling or pressure in your abdomen (bloating). Summary  A colon mass is an abnormal growth in the large intestine (colon). There are many possible causes of a colon mass.  Treatment depends on the cause of the mass. If your case is serious, you may need emergency surgery.  Take over-the-counter and prescription medicines only as told by your health care provider. This information is not intended to replace advice given to you by your health care provider. Make sure you discuss any questions you have with your health care provider. Document Revised: 09/04/2017 Document Reviewed: 07/13/2017 Elsevier Patient Education  2020 [employer].

## 2019-10-28 NOTE — Discharge Summary (Addendum)
Discharge Summary  Amber Medina [id number] DOB: Jul 31, 1951  PCP: [name], MD  Admit date: 10/20/2019 Discharge date: 10/28/2019  Time spent: 35 minutes   Recommendations for Outpatient Follow-up:  1. Follow up with oncology 2. Follow up with your cardiologist 3. Follow up with your PCP 4. Keep your chemotherapy's appointments 5. Take your medications as prescribed 6. Continue PT OT with assistance and fall precautions.   Discharge Diagnoses:  Active Hospital Problems   Diagnosis Date Noted  . Near syncope 10/20/2019  . Leukocytosis 10/20/2019  . Colon cancer metastasized to liver ([facility]) 08/17/2019  . Chronic diastolic (congestive) heart failure ([facility]) 10/11/2015  . Persistent atrial fibrillation ([facility]) 10/11/2015    Resolved Hospital Problems   Diagnosis Date Noted Date Resolved  . HTN (hypertension) 10/20/2019 10/20/2019    Discharge Condition: Stable   Diet recommendation: Heart healthy carb modified diet.   Vitals:   10/28/19 0633 10/28/19 1230  BP: 110/65 (!) 105/57  Pulse: 68 70  Resp: 19 20  Temp: (!) 97.5 F (36.4 C) 97.6 F (36.4 C)  SpO2: 94% 95%    History of present illness:  69 year old female with history of chronic A. fib, chronic diastolic CHF, anemia of chronic disease, metastatic colon cancer currently on chemotherapy presented to the emergency room with episode of feeling dizzy when getting out of bed. Patient also has progressive deconditioning and weakness. She is on her fifth cycle of chemo.  In the ED, CTA was negative for PE.  Due to elevated procalcitonin and lactic acid she was started on empiric IV abx on 10/22/19.  Cultures are negative to date.  No clear source of infection but WBC, procalcitonin and lactic acid are trending down while on antibiotics.  Empiric IV antibiotics stopped as her leukocytosis is likely secondary to progression of her cancer per oncology.  Restarted chemotherapy.  Start of cycle 1 FOLFIRI 10/26/19.  She will  complete her 5-FU infusion on 10/28/19 as scheduled.  Mild abdominal discomfort and mild diarrhea resolved with imodium.  10/28/19: Seen and examined.  No new complaints.    Vital signs and labs reviewed and are stable.  Ok to discharge per [name], oncology.  CSW contacted to verify that she will have transportation available to and from cancer center to continue her chemotherapy.  Hospital Course:  Principal Problem:   Near syncope Active Problems:   Chronic diastolic (congestive) heart failure (HCC)   Persistent atrial fibrillation (H[facility])   Colon cancer metastasized to liver ([facility])   Leukocytosis  Near syncope: Likely due to deconditioning. In sinus rhythm. Echocardiogram showed left ventricular hypertrophy moderately.  LVEF 60 to 65%.  Grade 2 diastolic dysfunction.  No recurrence while in the hospital.  Metastatic colon cancer: Clinical metastasis to liver, CT chest with evidence of metastasis.  Oncology following.  Started cycle 1 FOLFIRI/Avastin on 10/26/19 She will complete her 5-FU infusion on 10/28/19 as scheduled.  Mild abdominal discomfort and mild diarrhea resolved with imodium.  Diarrhea likely secondary to chemotherapy Discussed with [name], continue Imodium as needed Diarrhea improving on Imodium Received gentle IV fluid.  Severe protein calorie malnutrition in the setting of malignancy Albumin 1.5 Continue to increase oral protein calorie intake.  Anemia of chronic disease Hemoglobin stable at 8.9 from 8.1 No sign of overt bleeding MCV 97.9.  Ascending aorta aneurysm measuring 4.1 cm on 2D echo done on 10/19/2019. Maintain blood pressure normotensive Follow-up outpatient with repeat imaging  Paroxysmal A. fib: Currently sinus rhythm. Rate controlled on  beta-blockers and Cardizem.  Rhythm controlled on amiodarone. On Xarelto for CVA prevention.  Leukocytosis/elevated procalcitonin and elevated lactic acid:  10/22/19 Elevated procalcitonin up  to 3.14, elevated lactic acid up to 2.7 Markers are trending down.  Received IV antibiotics.  Abx stopped as leukocytosis was thought to be secondary to advancement of her malignancy. MRSA negative. No clear sources of infection Cx negative to date WBC continues to trend down. Repeat CBC in 1 week.  Deconditioning/physical debility:  PT recommending SNF.   Continue PT OT with assistance and fall precautions as tolerated.  Goals of care Partial code and undergoing chemotherapy.  Situational anxiety Po ativan 0.5 mg daily prn x 3 doses Recommend to limit use.    Code Status:Partial   Consultants:  Oncology  Palliative care team.  Procedures:  None   Discharge Exam: BP (!) 105/57 (BP Location: Right Arm)   Pulse 70   Temp 97.6 F (36.4 C) (Oral)   Resp 20   Ht 5\' 6"  (1.676 m)   Wt (!) 137.3 kg   LMP  (LMP Unknown)   SpO2 95%   BMI 48.84 kg/m  . General: 69 y.o. year-old female well developed well nourished in no acute distress.  Alert and oriented x3. . Cardiovascular: Regular rate and rhythm with no rubs or gallops.  No thyromegaly or JVD noted.   Marland Kitchen Respiratory: Clear to auscultation with no wheezes or rales. Good inspiratory effort. . Abdomen: Obese with normal bowel sounds present. . Musculoskeletal: No lower extremity edema. 2/4 pulses in all 4 extremities. Marland Kitchen Psychiatry: Mood is appropriate for condition and setting  Discharge Instructions You were cared for by a hospitalist during your hospital stay. If you have any questions about your discharge medications or the care you received while you were in the hospital after you are discharged, you can call the unit and asked to speak with the hospitalist on call if the hospitalist that took care of you is not available. Once you are discharged, your primary care physician will handle any further medical issues. Please note that NO REFILLS for any discharge medications will be authorized once you are  discharged, as it is imperative that you return to your primary care physician (or establish a relationship with a primary care physician if you do not have one) for your aftercare needs so that they can reassess your need for medications and monitor your lab values.   Allergies as of 10/28/2019      Reactions   Compazine [prochlorperazine Edisylate] Swelling   Zyrtec [cetirizine] Swelling   Swelling of nose      Medication List    STOP taking these medications   acetaminophen 325 MG tablet Commonly known as: TYLENOL   docusate sodium 100 MG capsule Commonly known as: Colace   furosemide 40 MG tablet Commonly known as: LASIX     TAKE these medications   amiodarone 100 MG tablet Commonly known as: PACERONE Take 1 tablet (100 mg total) by mouth at bedtime. What changed:   medication strength  how much to take  when to take this   diltiazem 240 MG 24 hr capsule Commonly known as: Cartia XT Take 1 capsule (240 mg total) by mouth daily. Please make overdue annual appt for future refills. [phone]. 2nd attempt. What changed:   when to take this  additional instructions   ferrous gluconate 324 MG tablet Commonly known as: FERGON Take 1 tablet (324 mg total) by mouth 3 (three) times daily with meals.  fluticasone 50 MCG/ACT nasal spray Commonly known as: FLONASE Place 1 spray into both nostrils daily. What changed:   when to take this  reasons to take this   lidocaine-prilocaine cream Commonly known as: EMLA Apply 1 application topically as directed.   loperamide 2 MG capsule Commonly known as: IMODIUM Take 1 capsule (2 mg total) by mouth as needed for diarrhea or loose stools (after each loose stool).   loratadine 10 MG tablet Commonly known as: Claritin Take 1 tablet (10 mg total) by mouth daily. What changed:   when to take this  reasons to take this   LORazepam 0.5 MG tablet Commonly known as: ATIVAN Take 1 tablet (0.5 mg total) by mouth  daily as needed for up to 3 days for anxiety.   magic mouthwash Soln Take 5 mLs by mouth 4 (four) times daily as needed for mouth pain (swish and spit).   metoprolol succinate 25 MG 24 hr tablet Commonly known as: TOPROL-XL TAKE 1 TABLET BY MOUTH EVERY DAY. PLEASE MAKE AN APPOINTMENT WITH DOCTOR TURNER FOR FURHTER REFILLS. What changed: See the new instructions.   ondansetron 8 MG tablet Commonly known as: ZOFRAN Take 1 tablet (8 mg total) by mouth every 8 (eight) hours as needed for nausea or vomiting.   rivaroxaban 20 MG Tabs tablet Commonly known as: Xarelto Take 1 tablet (20 mg total) by mouth daily with supper.      Allergies  Allergen Reactions  . Compazine [Prochlorperazine Edisylate] Swelling  . Zyrtec [Cetirizine] Swelling    Swelling of nose    Contact information for follow-up providers    [name], MD. Call in 1 day(s).   Specialty: Cardiology Why: Please call for a post hospital follow up appointment. Contact information: [id number] [place] [address] [phone]        Ladell Pier, MD. Call in 1 day(s).   Specialty: Oncology Why: Please call for a post hospital follow up appointment. Contact information: [place] [address] [phone]        [place]. Call in 1 day(s).   Why: please call for a post hospital follow up appointment. Contact information: [address] [place] [phone]           Contact information for after-discharge care    Destination    [facility] Preferred SNF .   Service: Skilled Nursing Contact information: [address] [place] [phone]                   The results of significant diagnostics from this hospitalization (including imaging, microbiology, ancillary and laboratory) are listed below for reference.    Significant  Diagnostic Studies: CT Angio Chest PE W and/or Wo Contrast  Result Date: 10/20/2019 CLINICAL DATA:  Dyspnea on exertion EXAM: CT ANGIOGRAPHY CHEST WITH CONTRAST TECHNIQUE: Multidetector CT imaging of the chest was performed using the standard protocol during bolus administration of intravenous contrast. Multiplanar CT image reconstructions and MIPs were obtained to evaluate the vascular anatomy. CONTRAST:  15mL OMNIPAQUE IOHEXOL 350 MG/ML SOLN COMPARISON:  02/05/2018 and chest x-ray of the same date FINDINGS: Cardiovascular: Heart size is enlarged without pericardial effusion. Findings similar to the prior study. Aortic caliber 4 cm of the ascending thoracic aorta. No signs of pulmonary embolism. Mildly limited assessment of the lung bases due to respiratory motion. Mediastinum/Nodes: Right-sided central venous access device terminates  in the right atrium. Pre pericardial lymph node measuring 1.5 cm new from previous exams. No additional signs of mediastinal adenopathy. Esophagus grossly normal. Mildly nodular thyroid similar to prior study. Lungs/Pleura: Small pulmonary nodules scattered throughout the right chest some within some without calcification. New pulmonary nodule right lung apex (image 20, series 6) 6 mm New right upper lobe pulmonary nodule (image 61, series 6) 6 mm New pulmonary nodule in the right lower lobe (image 84, series 6) 6 mm. Signs of basilar atelectasis on the left. No signs of pleural effusion Small left upper lobe pulmonary nodule, also appears new compared to the previous study. Upper Abdomen: Signs of hepatic metastatic disease not well evaluated. Suggestion of diffuse infiltration of the left hepatic lobe. Small amount perisplenic fluid has developed also since the study of 08/13/2019. Findings suggest interval worsening of disease in the liver though again this areas not well evaluated currently. Bulky celiac lymph node (image 88, series 4) 3.3 cm in short axis unchanged.  Musculoskeletal: No destructive bone process. No acute bone finding. Review of the MIP images confirms the above findings. IMPRESSION: 1. Negative for pulmonary embolism. 2. Interval development of multiple bilateral pulmonary nodules, noncalcified findings are suspicious for metastatic disease. 3. Interval development of a 1.5 cm pre pericardial lymph node (image 134, series 5), suspicious for metastatic disease. 4. Signs of hepatic metastatic disease not well evaluated currently, but appearing worse than on the prior study. 5. Small amount of perisplenic fluid has developed since the previous study. 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 Aortic Atherosclerosis (ICD10-I70.0). Electronically Signed   By: Zetta Bills M.D.   On: 10/20/2019 12:35   CT ABDOMEN PELVIS W CONTRAST  Result Date: 10/21/2019 CLINICAL DATA:  Metastatic colon cancer. Abdominal distension, weakness and diarrhea. EXAM: CT ABDOMEN AND PELVIS WITH CONTRAST TECHNIQUE: Multidetector CT imaging of the abdomen and pelvis was performed using the standard protocol following bolus administration of intravenous contrast. CONTRAST:  140mL OMNIPAQUE IOHEXOL 300 MG/ML  SOLN COMPARISON:  Chest CT from yesterday and CT abdomen/pelvis 08/13/2019 FINDINGS: Lower chest: A few small pulmonary nodules are noted and consistent with metastatic disease as demonstrated on yesterday's chest CT. Streaky left basilar atelectasis. No effusions or infiltrates. The heart is normal in size. No pericardial effusion. Hepatobiliary: Severe and markedly progressive hepatic metastatic disease. There is now a large confluent mass lesion occupying the left hepatic lobe and measuring 14.5 x 12.0 cm on image 36/2. Numerous lesions are also noted throughout the right hepatic lobe and these have also enlarged. Exophytic tumor versus adjacent adenopathy near the left hepatic lobe on image number 28/2 measures 4 cm.  This is unchanged in size. No biliary dilatation. Pancreas: No mass, inflammation or ductal dilatation. Spleen: Normal size.  No focal lesions. Adrenals/Urinary Tract: Stable bilateral adrenal gland nodules. These are likely benign adenomas. The kidneys are unremarkable. No renal lesions or hydronephrosis. The bladder is normal. Stomach/Bowel: The stomach, duodenum and small bowel are unremarkable. No mass lesions or obstructive findings. Stable surgical changes from a right hemicolectomy. Colonic diverticulosis but no findings for obstructing mass. Vascular/Lymphatic: Moderate scattered atherosclerotic calcifications involving the aorta and iliac arteries. No aneurysm or dissection. The major venous structures are patent. Enlarging omental and peritoneal surface lesions. The largest omental lesion measures 3.9 cm on image 53/2. This previously measured 3.1 cm. Other smaller lesions have enlarged slightly also. No retroperitoneal lymphadenopathy. Reproductive: The uterus and ovaries are unremarkable. Other: No pelvic  mass, pelvic adenopathy or free pelvic fluid collections. No inguinal mass or adenopathy. Musculoskeletal: No significant bony findings. IMPRESSION: 1. Significant progression of metastatic hepatic disease suggesting a very aggressive neoplasm. 2. Enlarging omental and peritoneal surface lesions. 3. Small pulmonary nodules consistent with pulmonary metastatic disease. Electronically Signed   By: Marijo Sanes M.D.   On: 10/21/2019 16:12   DG Chest Port 1 View  Result Date: 10/20/2019 CLINICAL DATA:  Dyspnea. EXAM: PORTABLE CHEST 1 VIEW COMPARISON:  08/13/2019 FINDINGS: The heart size and pulmonary vascularity are and the lungs clear. Aortic atherosclerosis. New power port in place. Tip is in superior vena cava just below the carina. No significant bone abnormality. IMPRESSION: 1. No active disease. 2.  Aortic Atherosclerosis (ICD10-I70.0). 3. Power port in good position. Electronically Signed   By:  Lorriane Shire M.D.   On: 10/20/2019 10:26   ECHOCARDIOGRAM COMPLETE  Result Date: 10/21/2019   ECHOCARDIOGRAM REPORT   Patient Name:   Amber Medina  Date of Exam: 10/21/2019 Medical Rec #:  [id number]  Height:       66.0 in Accession #:    [id number] Weight:       270.0 lb Date of Birth:  03/27/1951  BSA:          2.27 m Patient Age:    65 years   BP:           131/55 mmHg Patient Gender: F          HR:           108 bpm. Exam Location:  Inpatient Procedure: 2D Echo Indications:    Syncope 780.2 / R55  History:        Patient has prior history of Echocardiogram examinations, most                 recent 02/06/2018. CHF. Colon cancer. Chronic kidney disease.                 Anemia.  Sonographer:    Darlina Sicilian RDCS Referring Phys: [id number] [place]  1. Left ventricular ejection fraction, by visual estimation, is 60 to 65%. The left ventricle has normal function. There is moderately increased left ventricular hypertrophy.  2. Left ventricular diastolic parameters are consistent with Grade II diastolic dysfunction (pseudonormalization).  3. Elevated left atrial pressure.  4. Global right ventricle has normal systolic function.The right ventricular size is normal.  5. Left atrial size was normal.  6. Right atrial size was normal.  7. The mitral valve is normal in structure. No evidence of mitral valve regurgitation.  8. The tricuspid valve is normal in structure.  9. The aortic valve was not well visualized. Aortic valve regurgitation is mild. Mild to moderate aortic valve sclerosis/calcification without any evidence of aortic stenosis. 10. The pulmonic valve was not well visualized. Pulmonic valve regurgitation is not visualized. 11. The inferior vena cava is normal in size with <50% respiratory variability, suggesting right atrial pressure of 8 mmHg. 12. TR signal is inadequate for assessing pulmonary artery systolic pressure. 13. There is dilatation of the ascending aorta measuring 41 mm. FINDINGS   Left Ventricle: Left ventricular ejection fraction, by visual estimation, is 60 to 65%. The left ventricle has normal function. The left ventricle has no regional wall motion abnormalities. There is moderately increased left ventricular hypertrophy. Left ventricular diastolic parameters are consistent with Grade II diastolic dysfunction (pseudonormalization). Elevated left atrial pressure. Right Ventricle: The right ventricular size is normal. No increase in  right ventricular wall thickness. Global RV systolic function is has normal systolic function. Left Atrium: Left atrial size was normal in size. Right Atrium: Right atrial size was normal in size Pericardium: Trivial pericardial effusion is present. Mitral Valve: The mitral valve is normal in structure. No evidence of mitral valve regurgitation. Tricuspid Valve: The tricuspid valve is normal in structure. Tricuspid valve regurgitation is not demonstrated. Aortic Valve: The aortic valve was not well visualized. Aortic valve regurgitation is mild. Aortic regurgitation PHT measures 465 msec. Mild to moderate aortic valve sclerosis/calcification is present, without any evidence of aortic stenosis. Pulmonic Valve: The pulmonic valve was not well visualized. Pulmonic valve regurgitation is not visualized. Pulmonic regurgitation is not visualized. Aorta: Aortic dilatation noted. There is dilatation of the ascending aorta measuring 41 mm. Venous: The inferior vena cava is normal in size with less than 50% respiratory variability, suggesting right atrial pressure of 8 mmHg. IAS/Shunts: The interatrial septum was not well visualized.  LEFT VENTRICLE PLAX 2D LVIDd:         3.70 cm  Diastology LVIDs:         2.30 cm  LV e' lateral:   5.66 cm/s LV PW:         1.30 cm  LV E/e' lateral: 17.2 LV IVS:        1.40 cm  LV e' medial:    5.77 cm/s LVOT diam:     2.00 cm  LV E/e' medial:  16.9 LV SV:         40 ml LV SV Index:   16.31 LVOT Area:     3.14 cm  RIGHT VENTRICLE RV S  prime:     24.90 cm/s TAPSE (M-mode): 1.9 cm LEFT ATRIUM             Index       RIGHT ATRIUM           Index LA diam:        4.40 cm 1.94 cm/m  RA Area:     13.00 cm LA Vol (A2C):   58.9 ml 25.93 ml/m RA Volume:   29.50 ml  12.99 ml/m LA Vol (A4C):   64.8 ml 28.52 ml/m LA Biplane Vol: 68.0 ml 29.93 ml/m  AORTIC VALVE LVOT Vmax:   108.00 cm/s LVOT Vmean:  73.600 cm/s LVOT VTI:    0.169 m AI PHT:      465 msec  AORTA Ao Root diam: 3.10 cm Ao Asc diam:  4.10 cm MITRAL VALVE MV Area (PHT): 4.36 cm              SHUNTS MV PHT:        50.46 msec            Systemic VTI:  0.17 m MV Decel Time: 174 msec              Systemic Diam: 2.00 cm MV E velocity: 97.40 cm/s  103 cm/s MV A velocity: 106.00 cm/s 70.3 cm/s MV E/A ratio:  0.92        1.5  Oswaldo Milian MD Electronically signed by Oswaldo Milian MD Signature Date/Time: 10/21/2019/7:54:01 PM    Final     Microbiology: Recent Results (from the past 240 hour(s))  Respiratory Panel by RT PCR (Flu A&B, Covid) - Nasopharyngeal Swab     Status: None   Collection Time: 10/20/19 10:10 AM   Specimen: Nasopharyngeal Swab  Result Value Ref Range Status   SARS Coronavirus 2 by RT  PCR NEGATIVE NEGATIVE Final    Comment: (NOTE) SARS-CoV-2 target nucleic acids are NOT DETECTED. The SARS-CoV-2 RNA is generally detectable in upper respiratoy specimens during the acute phase of infection. The lowest concentration of SARS-CoV-2 viral copies this assay can detect is 131 copies/mL. A negative result does not preclude SARS-Cov-2 infection and should not be used as the sole basis for treatment or other patient management decisions. A negative result may occur with  improper specimen collection/handling, submission of specimen other than nasopharyngeal swab, presence of viral mutation(s) within the areas targeted by this assay, and inadequate number of viral copies (<131 copies/mL). A negative result must be combined with clinical observations, patient  history, and epidemiological information. The expected result is Negative. Fact Sheet for Patients:  PinkCheek.be Fact Sheet for Healthcare Providers:  GravelBags.it This test is not yet ap proved or cleared by the [place] FDA and  has been authorized for detection and/or diagnosis of SARS-CoV-2 by FDA under an Emergency Use Authorization (EUA). This EUA will remain  in effect (meaning this test can be used) for the duration of the COVID-19 declaration under Section 564(b)(1) of the Act, 21 U.S.C. section 360bbb-3(b)(1), unless the authorization is terminated or revoked sooner.    Influenza A by PCR NEGATIVE NEGATIVE Final   Influenza B by PCR NEGATIVE NEGATIVE Final    Comment: (NOTE) The Xpert Xpress SARS-CoV-2/FLU/RSV assay is intended as an aid in  the diagnosis of influenza from Nasopharyngeal swab specimens and  should not be used as a sole basis for treatment. Nasal washings and  aspirates are unacceptable for Xpert Xpress SARS-CoV-2/FLU/RSV  testing. Fact Sheet for Patients: PinkCheek.be Fact Sheet for Healthcare Providers: GravelBags.it This test is not yet approved or cleared by the [place] FDA and  has been authorized for detection and/or diagnosis of SARS-CoV-2 by  FDA under an Emergency Use Authorization (EUA). This EUA will remain  in effect (meaning this test can be used) for the duration of the  Covid-19 declaration under Section 564(b)(1) of the Act, 21  U.S.C. section 360bbb-3(b)(1), unless the authorization is  terminated or revoked. Performed at [facility], [place] [address], [place], [place] [address]   Culture, blood (routine x 2)     Status: None   Collection Time: 10/21/19 11:38 AM   Specimen: BLOOD  Result Value Ref Range Status   Specimen Description   Final    BLOOD RIGHT ARM Performed at [facility] [address] [place], [place] [address]    Special Requests   Final    BOTTLES DRAWN AEROBIC AND ANAEROBIC Blood Culture adequate volume Performed at [facility] [address] [place], [place] [address]    Culture   Final    NO GROWTH 5 DAYS Performed at [facility], [place] [address] [place], [place] [address]    Report Status 10/26/2019 FINAL  Final  Culture, blood (routine x 2)     Status: None   Collection Time: 10/21/19 11:38 AM   Specimen: BLOOD  Result Value Ref Range Status   Specimen Description   Final    BLOOD LEFT ARM Performed at [facility] [address], [place], [place] [address]    Special Requests   Final    BOTTLES DRAWN AEROBIC ONLY Blood Culture adequate volume Performed at [facility] [address], [place], [place] [address]    Culture   Final    NO  GROWTH 5 DAYS Performed at [facility], [place] [address], [place], [place] [address]    Report Status 10/26/2019 FINAL  Final  MRSA PCR Screening     Status: None   Collection Time: 10/22/19  6:04 PM   Specimen: Nasal Mucosa; Nasopharyngeal  Result Value Ref Range Status   MRSA by PCR NEGATIVE NEGATIVE Final    Comment:        The GeneXpert MRSA Assay (FDA approved for NASAL specimens only), is one component of a comprehensive MRSA colonization surveillance program. It is not intended to diagnose MRSA infection nor to guide or monitor treatment for MRSA infections. Performed at [facility], [place] [address], [place], [place] [address]   Culture, Urine     Status: None   Collection Time: 10/23/19 12:23 PM   Specimen: Urine, Clean Catch  Result Value Ref Range Status   Specimen Description   Final    URINE, CLEAN CATCH Performed at [facility], [place] [address], [place], [place] [address]    Special Requests   Final    Immunocompromised Performed at [facility], [place] [address] [place], [place] [address]    Culture   Final    NO GROWTH Performed at [facility], [place] [address] [place], [place] [address]    Report Status 10/24/2019 FINAL  Final  SARS CORONAVIRUS 2 (TAT 6-24 HRS) Nasopharyngeal Nasopharyngeal Swab     Status: None   Collection Time: 10/27/19  1:56 PM   Specimen: Nasopharyngeal Swab  Result Value Ref Range Status   SARS Coronavirus 2 NEGATIVE NEGATIVE Final    Comment: (NOTE) SARS-CoV-2 target nucleic acids are NOT DETECTED. The SARS-CoV-2 RNA is generally detectable in upper and lower respiratory specimens during the acute phase of infection. Negative results do not preclude SARS-CoV-2 infection, do not rule out co-infections with other pathogens, and should not be used as the sole basis for treatment or other patient management decisions. Negative results must be combined with clinical observations, patient history, and epidemiological information. The expected result is Negative. Fact Sheet for Patients: SugarRoll.be Fact Sheet for Healthcare Providers: https://www.woods-mathews.com/ This test is not yet approved or cleared by the [place] FDA and  has been authorized for detection and/or diagnosis of SARS-CoV-2 by FDA under an Emergency Use Authorization (EUA). This EUA will remain  in effect (meaning this test can be used) for the duration of the COVID-19 declaration under Section 56 4(b)(1) of the Act, 21 U.S.C. section 360bbb-3(b)(1), unless the authorization is terminated or revoked sooner. Performed at [facility], [place] [address], [place], [place] [address]      Labs: Basic Metabolic Panel: Recent Labs  Lab 10/22/19 [phone] 10/23/19 0448 10/24/19 0439 10/25/19 0754 10/26/19 0528 10/27/19 0553  NA  --  135  --  136 136 136  K  --  3.6  --  3.2* 3.3* 3.8  CL  --  101  --  103 104 103  CO2  --  23  --  24 24 22   GLUCOSE  --   100*  --  131* 124* 201*  BUN  --  15  --  16 17 25*  CREATININE  --  0.95 1.02* 0.98 0.89 0.91  CALCIUM  --  8.0*  --  8.1* 8.3* 8.3*  MG 2.2  --   --   --   --  1.9  PHOS  --   --   --   --   --  5.2*   Liver Function Tests: Recent Labs  Lab 10/23/19 0448 10/25/19 0754 10/26/19 0528 10/27/19 0553  AST 31 29 27 20   ALT 11 13 13 13   ALKPHOS 248* 296* 266* 278*  BILITOT 1.1 1.0 1.0 1.1  PROT 5.6* 5.7* 5.8* 6.2*  ALBUMIN 1.5* 1.4* 1.5* 1.6*   No results for input(s): LIPASE, AMYLASE in the last 168 hours. No results for input(s): AMMONIA in the last 168 hours. CBC: Recent Labs  Lab 10/22/19 0448 10/22/19 0448 10/23/19 0448 10/24/19 0913 10/25/19 0754 10/26/19 0528 10/27/19 0553  WBC 36.0*   < > 39.5* 32.6* 24.4* 21.6* 19.0*  NEUTROABS 31.3*  --  34.4*  --  20.8* 18.3* 17.4*  HGB 8.2*   < > 7.9* 7.9* 8.1* 8.1* 8.9*  HCT 29.0*   < > 27.8* 28.3* 28.3* 28.6* 31.8*  MCV 95.7   < > 96.2 98.3 98.3 97.9 97.8  PLT 210   < > 183 193 156 146* 152   < > = values in this interval not displayed.   Cardiac Enzymes: No results for input(s): CKTOTAL, CKMB, CKMBINDEX, TROPONINI in the last 168 hours. BNP: BNP (last 3 results) Recent Labs    08/13/19 0222 10/20/19 1011  BNP 85.6 179.9*    ProBNP (last 3 results) No results for input(s): PROBNP in the last 8760 hours.  CBG: No results for input(s): GLUCAP in the last 168 hours.     Signed:  Kayleen Memos, MD [facility] 10/28/2019, 2:02 PM

## 2019-10-28 NOTE — Progress Notes (Signed)
Called report to Yaakov Guthrie, RN at [facility]

## 2019-10-28 NOTE — TOC Transition Note (Addendum)
Transition of Care [facility]) - CM/SW Discharge Note   Patient Details  Name: Amber Medina MRN: [id number] Date of Birth: 1950/12/16  Transition of Care [facility]) CM/SW Contact:  Lynnell Catalan, RN Phone Number: 10/28/2019, 11:11 AM   Clinical Narrative:     Pt to dc to [facility] [place] today. Pt to go to room 712 and RN to call report to [phone]. PTAR to be set up for transport after Chemo finishes today.      Patient Goals and CMS Choice Patient states their goals for this hospitalization and ongoing recovery are:: "I definately need to go back to rehab.  But I don't want to go to [place]." CMS Medicare.gov Compare Post Acute Care list provided to:: Patient Choice offered to / list presented to : Patient   Readmission Risk Interventions Readmission Risk Prevention Plan 08/22/2019  Transportation Screening Complete  PCP or Specialist Appt within 3-5 Days Not Complete  Not Complete comments Not ready  [place] or [place] Not Complete  HRI or Home Care Consult comments NA  Social Work Consult for [place] Planning/Counseling Not Complete  SW consult not completed comments NA  Palliative Care Screening Not Applicable  Medication Review Press photographer) Complete  Some recent data might be hidden

## 2019-11-04 ENCOUNTER — Inpatient Hospital Stay
Admission: EM | Admit: 2019-11-04 | Discharge: 2019-12-05 | DRG: 808 | Disposition: E | Payer: Medicare HMO | Attending: Internal Medicine | Admitting: Internal Medicine

## 2019-11-04 ENCOUNTER — Other Ambulatory Visit: Payer: Self-pay

## 2019-11-04 ENCOUNTER — Other Ambulatory Visit: Payer: Self-pay | Admitting: Oncology

## 2019-11-04 ENCOUNTER — Telehealth: Payer: Self-pay | Admitting: *Deleted

## 2019-11-04 ENCOUNTER — Emergency Department: Payer: Medicare HMO

## 2019-11-04 DIAGNOSIS — C799 Secondary malignant neoplasm of unspecified site: Secondary | ICD-10-CM | POA: Diagnosis not present

## 2019-11-04 DIAGNOSIS — A419 Sepsis, unspecified organism: Secondary | ICD-10-CM | POA: Diagnosis not present

## 2019-11-04 DIAGNOSIS — L89152 Pressure ulcer of sacral region, stage 2: Secondary | ICD-10-CM | POA: Diagnosis present

## 2019-11-04 DIAGNOSIS — Z20822 Contact with and (suspected) exposure to covid-19: Secondary | ICD-10-CM | POA: Diagnosis present

## 2019-11-04 DIAGNOSIS — I712 Thoracic aortic aneurysm, without rupture: Secondary | ICD-10-CM | POA: Diagnosis present

## 2019-11-04 DIAGNOSIS — Z888 Allergy status to other drugs, medicaments and biological substances status: Secondary | ICD-10-CM | POA: Diagnosis not present

## 2019-11-04 DIAGNOSIS — I5032 Chronic diastolic (congestive) heart failure: Secondary | ICD-10-CM | POA: Diagnosis present

## 2019-11-04 DIAGNOSIS — D6959 Other secondary thrombocytopenia: Secondary | ICD-10-CM | POA: Diagnosis present

## 2019-11-04 DIAGNOSIS — I130 Hypertensive heart and chronic kidney disease with heart failure and stage 1 through stage 4 chronic kidney disease, or unspecified chronic kidney disease: Secondary | ICD-10-CM | POA: Diagnosis present

## 2019-11-04 DIAGNOSIS — Z515 Encounter for palliative care: Secondary | ICD-10-CM | POA: Diagnosis not present

## 2019-11-04 DIAGNOSIS — D61810 Antineoplastic chemotherapy induced pancytopenia: Principal | ICD-10-CM | POA: Diagnosis present

## 2019-11-04 DIAGNOSIS — Z87891 Personal history of nicotine dependence: Secondary | ICD-10-CM

## 2019-11-04 DIAGNOSIS — N183 Chronic kidney disease, stage 3 unspecified: Secondary | ICD-10-CM | POA: Diagnosis present

## 2019-11-04 DIAGNOSIS — D61818 Other pancytopenia: Secondary | ICD-10-CM

## 2019-11-04 DIAGNOSIS — T451X5A Adverse effect of antineoplastic and immunosuppressive drugs, initial encounter: Secondary | ICD-10-CM | POA: Diagnosis present

## 2019-11-04 DIAGNOSIS — Z8249 Family history of ischemic heart disease and other diseases of the circulatory system: Secondary | ICD-10-CM

## 2019-11-04 DIAGNOSIS — Z86718 Personal history of other venous thrombosis and embolism: Secondary | ICD-10-CM | POA: Diagnosis not present

## 2019-11-04 DIAGNOSIS — R6521 Severe sepsis with septic shock: Secondary | ICD-10-CM | POA: Diagnosis not present

## 2019-11-04 DIAGNOSIS — Z6841 Body Mass Index (BMI) 40.0 and over, adult: Secondary | ICD-10-CM | POA: Diagnosis not present

## 2019-11-04 DIAGNOSIS — N179 Acute kidney failure, unspecified: Secondary | ICD-10-CM

## 2019-11-04 DIAGNOSIS — C189 Malignant neoplasm of colon, unspecified: Secondary | ICD-10-CM | POA: Diagnosis present

## 2019-11-04 DIAGNOSIS — Z79899 Other long term (current) drug therapy: Secondary | ICD-10-CM | POA: Diagnosis not present

## 2019-11-04 DIAGNOSIS — R140 Abdominal distension (gaseous): Secondary | ICD-10-CM

## 2019-11-04 DIAGNOSIS — Z66 Do not resuscitate: Secondary | ICD-10-CM | POA: Diagnosis not present

## 2019-11-04 DIAGNOSIS — C787 Secondary malignant neoplasm of liver and intrahepatic bile duct: Secondary | ICD-10-CM | POA: Diagnosis present

## 2019-11-04 DIAGNOSIS — Z7901 Long term (current) use of anticoagulants: Secondary | ICD-10-CM | POA: Diagnosis not present

## 2019-11-04 DIAGNOSIS — K56609 Unspecified intestinal obstruction, unspecified as to partial versus complete obstruction: Secondary | ICD-10-CM | POA: Diagnosis not present

## 2019-11-04 DIAGNOSIS — Z833 Family history of diabetes mellitus: Secondary | ICD-10-CM

## 2019-11-04 DIAGNOSIS — I4819 Other persistent atrial fibrillation: Secondary | ICD-10-CM | POA: Diagnosis present

## 2019-11-04 DIAGNOSIS — R571 Hypovolemic shock: Secondary | ICD-10-CM | POA: Diagnosis not present

## 2019-11-04 DIAGNOSIS — G4733 Obstructive sleep apnea (adult) (pediatric): Secondary | ICD-10-CM | POA: Diagnosis present

## 2019-11-04 DIAGNOSIS — K922 Gastrointestinal hemorrhage, unspecified: Secondary | ICD-10-CM

## 2019-11-04 DIAGNOSIS — R579 Shock, unspecified: Secondary | ICD-10-CM | POA: Diagnosis not present

## 2019-11-04 DIAGNOSIS — I7121 Aneurysm of the ascending aorta, without rupture: Secondary | ICD-10-CM

## 2019-11-04 DIAGNOSIS — R0600 Dyspnea, unspecified: Secondary | ICD-10-CM

## 2019-11-04 LAB — PROTIME-INR
INR: 1.9 — ABNORMAL HIGH (ref 0.8–1.2)
Prothrombin Time: 21.4 seconds — ABNORMAL HIGH (ref 11.4–15.2)

## 2019-11-04 LAB — COMPREHENSIVE METABOLIC PANEL
ALT: 11 U/L (ref 0–44)
AST: 12 U/L — ABNORMAL LOW (ref 15–41)
Albumin: 1.5 g/dL — ABNORMAL LOW (ref 3.5–5.0)
Alkaline Phosphatase: 149 U/L — ABNORMAL HIGH (ref 38–126)
Anion gap: 10 (ref 5–15)
BUN: 34 mg/dL — ABNORMAL HIGH (ref 8–23)
CO2: 19 mmol/L — ABNORMAL LOW (ref 22–32)
Calcium: 8 mg/dL — ABNORMAL LOW (ref 8.9–10.3)
Chloride: 102 mmol/L (ref 98–111)
Creatinine, Ser: 1.46 mg/dL — ABNORMAL HIGH (ref 0.44–1.00)
GFR calc Af Amer: 42 mL/min — ABNORMAL LOW (ref >60)
GFR calc non Af Amer: 36 mL/min — ABNORMAL LOW (ref >60)
Glucose, Bld: 138 mg/dL — ABNORMAL HIGH (ref 70–99)
Potassium: 4 mmol/L (ref 3.5–5.1)
Sodium: 131 mmol/L — ABNORMAL LOW (ref 135–145)
Total Bilirubin: 2.5 mg/dL — ABNORMAL HIGH (ref 0.3–1.2)
Total Protein: 6.1 g/dL — ABNORMAL LOW (ref 6.5–8.1)

## 2019-11-04 LAB — CBC WITH DIFFERENTIAL/PLATELET
Basophils Absolute: 0 10*3/uL (ref 0.0–0.1)
Basophils Relative: 0 %
Eosinophils Absolute: 0 10*3/uL (ref 0.0–0.5)
Eosinophils Relative: 0 %
HCT: 21.5 % — ABNORMAL LOW (ref 36.0–46.0)
Hemoglobin: 6.2 g/dL — ABNORMAL LOW (ref 12.0–15.0)
Immature Granulocytes: 0 %
Lymphocytes Relative: 61 %
Lymphs Abs: 0.3 10*3/uL — ABNORMAL LOW (ref 0.7–4.0)
MCH: 27.8 pg (ref 26.0–34.0)
MCHC: 28.8 g/dL — ABNORMAL LOW (ref 30.0–36.0)
MCV: 96.4 fL (ref 80.0–100.0)
Monocytes Absolute: 0 10*3/uL — ABNORMAL LOW (ref 0.1–1.0)
Monocytes Relative: 6 %
Neutro Abs: 0.2 10*3/uL — ABNORMAL LOW (ref 1.7–7.7)
Neutrophils Relative %: 33 %
Platelets: 22 10*3/uL — CL (ref 150–400)
RBC: 2.23 MIL/uL — ABNORMAL LOW (ref 3.87–5.11)
RDW: 17.2 % — ABNORMAL HIGH (ref 11.5–15.5)
WBC: 0.5 10*3/uL — CL (ref 4.0–10.5)

## 2019-11-04 LAB — RESPIRATORY PANEL BY RT PCR (FLU A&B, COVID)
Influenza A by PCR: NEGATIVE
Influenza B by PCR: NEGATIVE
SARS Coronavirus 2 by RT PCR: NEGATIVE

## 2019-11-04 LAB — TROPONIN I (HIGH SENSITIVITY): Troponin I (High Sensitivity): 13 ng/L (ref <18)

## 2019-11-04 LAB — ABO/RH: ABO/RH(D): O NEG

## 2019-11-04 MED ORDER — ONDANSETRON HCL 4 MG/2ML IJ SOLN
4.0000 mg | Freq: Four times a day (QID) | INTRAMUSCULAR | Status: DC | PRN
  Administered 2019-11-05: 4 mg via INTRAVENOUS
  Filled 2019-11-04: qty 2

## 2019-11-04 MED ORDER — HYDROCODONE-ACETAMINOPHEN 5-325 MG PO TABS: 1.0000 | ORAL_TABLET | ORAL | 0 refills | Status: AC | PRN

## 2019-11-04 MED ORDER — SODIUM CHLORIDE 0.9 % IV SOLN: 10.0000 mL/h | Freq: Once | INTRAVENOUS | Status: DC

## 2019-11-04 MED ORDER — SODIUM CHLORIDE 0.9 % IV BOLUS
500.0000 mL | Freq: Once | INTRAVENOUS | Status: AC
  Administered 2019-11-04: 500 mL via INTRAVENOUS

## 2019-11-04 MED ORDER — MUPIROCIN 2 % EX OINT: 1.0000 "application " | TOPICAL_OINTMENT | Freq: Two times a day (BID) | CUTANEOUS | Status: DC

## 2019-11-04 MED ORDER — LOPERAMIDE HCL 2 MG PO CAPS
2.0000 mg | ORAL_CAPSULE | ORAL | Status: DC | PRN
  Administered 2019-11-05: 2 mg via ORAL
  Filled 2019-11-04 (×2): qty 1

## 2019-11-04 MED ORDER — METOPROLOL SUCCINATE ER 50 MG PO TB24: 25.0000 mg | ORAL_TABLET | Freq: Every day | ORAL | Status: DC

## 2019-11-04 MED ORDER — SODIUM CHLORIDE 0.9 % IV SOLN
2.0000 g | Freq: Two times a day (BID) | INTRAVENOUS | Status: DC
  Administered 2019-11-05 – 2019-11-07 (×6): 2 g via INTRAVENOUS
  Filled 2019-11-04 (×8): qty 2

## 2019-11-04 MED ORDER — LACTATED RINGERS IV SOLN
INTRAVENOUS | Status: DC
  Administered 2019-11-06 – 2019-11-07 (×2): 1000 mL via INTRAVENOUS

## 2019-11-04 MED ORDER — PANTOPRAZOLE SODIUM 40 MG IV SOLR
40.0000 mg | Freq: Two times a day (BID) | INTRAVENOUS | Status: DC
  Administered 2019-11-05 – 2019-11-07 (×6): 40 mg via INTRAVENOUS
  Filled 2019-11-04 (×6): qty 40

## 2019-11-04 MED ORDER — AMIODARONE HCL 200 MG PO TABS: 100.0000 mg | ORAL_TABLET | Freq: Every day | ORAL | Status: DC

## 2019-11-04 NOTE — ED Provider Notes (Signed)
[facility] Emergency Department Provider Note       Time seen: ----------------------------------------- 7:29 PM on 11/06/2019 -----------------------------------------  I have reviewed the triage vital signs and the nursing notes.  HISTORY  Chief Complaint Abnormal Lab    HPI Amber Medina is a 69 y.o. female with a history of heart failure, CKD, hypertension, atrial fibrillation, stage IV colon cancer on chemotherapy who presents to the ED for abnormal lab work.  Patient arrives from [facility] with low blood counts.  Reportedly her white blood cell count, hemoglobin and platelet count are all low.  She has had vomiting and diarrhea as well.  Past Medical History:  Diagnosis Date  . Abnormal pulmonary function test 02/2017  . Chronic diastolic (congestive) heart failure ([place]) 10/11/2015  . CKD (chronic kidney disease), stage II   . Colon cancer ([place])   . Hypertension   . Microcytic anemia    by labs 02/2017 -> adm 06/2017 with severe anemia, found to have colon CA.  . Morbid obesity ([place])   . Nocturnal hypoxemia   . Persistent atrial fibrillation ([place])   . Snoring   . Suspected sleep apnea     Patient Active Problem List   Diagnosis Date Noted  . Near syncope 10/20/2019  . Leukocytosis 10/20/2019  . Colon cancer metastasized to liver ([place]) 08/17/2019  . Goals of care, counseling/discussion 08/17/2019  . Liver lesion 08/13/2019  . Abdominal distension 08/13/2019  . Acute pulmonary embolism with acute cor pulmonale ([place]) 02/05/2018  . Adenocarcinoma of colon ([place]) 02/05/2018  . Colonic mass 06/15/2017  . Anemia 06/11/2017  . Chronic diastolic (congestive) heart failure ([place]) 10/11/2015  . Persistent atrial fibrillation ([place]) 10/11/2015  . OSA (obstructive sleep apnea)   . Acute respiratory failure with hypoxia ([place])   . Hyperglycemia 09/06/2015  . Anasarca 09/06/2015  . Obesity, morbid ([place]) 09/06/2015    Past Surgical History:  Procedure  Laterality Date  . COLONOSCOPY WITH PROPOFOL N/A 06/13/2017   Procedure: COLONOSCOPY WITH PROPOFOL;  Surgeon: Carol Ada, MD;  Location: [facility] ENDOSCOPY;  Service: Endoscopy;  Laterality: N/A;  . ESOPHAGOGASTRODUODENOSCOPY (EGD) WITH PROPOFOL N/A 06/13/2017   Procedure: ESOPHAGOGASTRODUODENOSCOPY (EGD) WITH PROPOFOL;  Surgeon: Carol Ada, MD;  Location: [facility] ENDOSCOPY;  Service: Endoscopy;  Laterality: N/A;  . IR IMAGING GUIDED PORT INSERTION  08/16/2019  . TONSILLECTOMY      Allergies Compazine [prochlorperazine edisylate] and Zyrtec [cetirizine]  Social History Social History   Tobacco Use  . Smoking status: Former Research scientist (life sciences)  . Smokeless tobacco: Never Used  . Tobacco comment: quit in 2013  Substance Use Topics  . Alcohol use: Yes    Comment: occ  . Drug use: No    Review of Systems Constitutional: Negative for fever. Cardiovascular: Negative for chest pain. Respiratory: Positive shortness of breath Gastrointestinal: Positive for abdominal pain, vomiting and diarrhea Musculoskeletal: Negative for back pain. Skin: Positive for pallor, bleeding wounds Neurological: Negative for headaches, positive for weakness  All systems negative/normal/unremarkable except as stated in the HPI  ____________________________________________   PHYSICAL EXAM:  VITAL SIGNS: ED Triage Vitals  Enc Vitals Group     BP --      Pulse --      Resp --      Temp --      Temp src --      SpO2 --      Weight 11/02/2019 1928 (!) 302 lb 11.1 oz (137.3 kg)     Height --  Head Circumference --      Peak Flow --      Pain Score 10/28/2019 1927 0     Pain Loc --      Pain Edu? --      Excl. in [place]? --     Constitutional: Alert and oriented.  Chronically ill-appearing, mild distress Eyes: Conjunctivae are pale. Normal extraocular movements. ENT      Head: Normocephalic and atraumatic.      Nose: No congestion/rhinnorhea.      Mouth/Throat: Mucous membranes are moist.      Neck: No  stridor. Cardiovascular: Normal rate, regular rhythm. No murmurs, rubs, or gallops. Respiratory: Normal respiratory effort without tachypnea nor retractions. Breath sounds are clear and equal bilaterally. No wheezes/rales/rhonchi. Gastrointestinal: Distended, diffuse abdominal tenderness, hypoactive bowel sounds Rectal: Black stool, heme positive Musculoskeletal: Nontender with normal range of motion in extremities.  Peripheral edema is noted Neurologic:  Normal speech and language. No gross focal neurologic deficits are appreciated.  Skin:  Skin is warm, dry and intact.  Pallor is noted Psychiatric: Mood and affect are normal. Speech and behavior are normal.  ____________________________________________  EKG: Interpreted by me.  Sinus rhythm with rate of 99 bpm, low voltage, left axis deviation, normal QT  ____________________________________________  ED COURSE:  As part of my medical decision making, I reviewed the following data within the [place] History obtained from family if available, nursing notes, old chart and ekg, as well as notes from prior ED visits. Patient presented for abnormal lab work, we will assess with labs and imaging as indicated at this time.   Procedures  Amber Medina was evaluated in Emergency Department on 10/20/2019 for the symptoms described in the history of present illness. She was evaluated in the context of the global COVID-19 pandemic, which necessitated consideration that the patient might be at risk for infection with the SARS-CoV-2 virus that causes COVID-19. Institutional protocols and algorithms that pertain to the evaluation of patients at risk for COVID-19 are in a state of rapid change based on information released by regulatory bodies including the CDC and federal and state organizations. These policies and algorithms were followed during the patient's care in the ED.  ____________________________________________   LABS (pertinent  positives/negatives)  Labs Reviewed  CBC WITH DIFFERENTIAL/PLATELET - Abnormal; Notable for the following components:      Result Value   WBC 0.5 (*)    RBC 2.23 (*)    Hemoglobin 6.2 (*)    HCT 21.5 (*)    MCHC 28.8 (*)    RDW 17.2 (*)    Platelets 22 (*)    Neutro Abs 0.2 (*)    Lymphs Abs 0.3 (*)    Monocytes Absolute 0.0 (*)    All other components within normal limits  COMPREHENSIVE METABOLIC PANEL - Abnormal; Notable for the following components:   Sodium 131 (*)    CO2 19 (*)    Glucose, Bld 138 (*)    BUN 34 (*)    Creatinine, Ser 1.46 (*)    Calcium 8.0 (*)    Total Protein 6.1 (*)    Albumin 1.5 (*)    AST 12 (*)    Alkaline Phosphatase 149 (*)    Total Bilirubin 2.5 (*)    GFR calc non Af Amer 36 (*)    GFR calc Af Amer 42 (*)    All other components within normal limits  PROTIME-INR - Abnormal; Notable for the following components:  Prothrombin Time 21.4 (*)    INR 1.9 (*)    All other components within normal limits  PATHOLOGIST SMEAR REVIEW  URINALYSIS, COMPLETE (UACMP) WITH MICROSCOPIC  CBC WITH DIFFERENTIAL/PLATELET  CBC WITH DIFFERENTIAL/PLATELET  TYPE AND SCREEN  PREPARE RBC (CROSSMATCH)  PREPARE PLATELET PHERESIS  PREPARE FRESH FROZEN PLASMA  ABO/RH  TROPONIN I (HIGH SENSITIVITY)   CRITICAL CARE Performed by: Laurence Aly   Total critical care time: 30 minutes  Critical care time was exclusive of separately billable procedures and treating other patients.  Critical care was necessary to treat or prevent imminent or life-threatening deterioration.  Critical care was time spent personally by me on the following activities: development of treatment plan with patient and/or surrogate as well as nursing, discussions with consultants, evaluation of patient's response to treatment, examination of patient, obtaining history from patient or surrogate, ordering and performing treatments and interventions, ordering and review of laboratory  studies, ordering and review of radiographic studies, pulse oximetry and re-evaluation of patient's condition.   RADIOLOGY  Chest x-ray  IMPRESSION:  The study is limited due to patient body habitus and significant  patient rotation. Haziness over the right hemithorax could be due to  patient rotation. A layering effusion or asymmetric edema could have  a similar appearance. Recommend a better position chest x-ray for  further evaluation. No other acute abnormalities.  ____________________________________________   DIFFERENTIAL DIAGNOSIS   Anemia, thrombocytopenia, leukopenia, coagulopathy, dehydration, electrolyte abnormality, gastroenteritis, partial obstruction  FINAL ASSESSMENT AND PLAN  Pancytopenia, coagulopathy, gastrointestinal bleeding   Plan: The patient had presented for abnormal lab work. Patient's labs revealed numerous abnormalities including pancytopenia and coagulopathy, chronic kidney disease. Patient's imaging is nonspecific. Patient requires numerous interventions including fresh frozen plasma, platelets and blood transfusion. She will also need oncology consultation and comanagement as well as GI consultation for occult GI bleeding. Patient is stable at this time.   Laurence Aly, MD    Note: This note was generated in part or whole with voice recognition software. Voice recognition is usually quite accurate but there are transcription errors that can and very often do occur. I apologize for any typographical errors that were not detected and corrected.     Earleen Newport, MD 10/24/2019 2131

## 2019-11-04 NOTE — Telephone Encounter (Addendum)
Called reporting abdominal pain and it feels "hard as a rock". Too uncomfortable to move due to pain. Vomited x 1 yesterday, but none today--Has Zofran for nausea. She reports she is having soft stools. Spoke with nurse, Gabriel Cirri and she confirms above information and that her abdomen may be a little more firm on right side. She does not have pain medication ordered and the facility attending is only doing virtual visits since she is COVID section for 14 days due to recent hospitalization. Provided fax # to send any med orders. She is allergic to compazine, so will stay w/zofran for now. [name] notified and will sent script for Vicodin to facility supplying pharmacy. Faxed notification to facility.

## 2019-11-04 NOTE — ED Triage Notes (Addendum)
Patient coming [facility] from [facility] for abnormal labs. Patient's WBC count is 0.6, HGB 6, PLT count 14. Patient c/o emesis and diarrhea

## 2019-11-04 NOTE — Progress Notes (Signed)
Pharmacy Antibiotic Note  Amber Medina is a 69 y.o. female admitted on 10/16/2019 with febrile neutropenia.  Pharmacy has been consulted for Cefepime dosing.  Plan: Cefepime 2 gm IV Q12H ordered to start on 1/29 @ 2230  Weight: (!) 302 lb 11.1 oz (137.3 kg)  Temp (24hrs), Avg:99.6 F (37.6 C), Min:99.5 F (37.5 C), Max:99.7 F (37.6 C)  Recent Labs  Lab 10/31/2019 1944  WBC 0.5*  CREATININE 1.46*    Estimated Creatinine Clearance: 50.5 mL/min (A) (by C-G formula based on SCr of 1.46 mg/dL (H)).    Allergies  Allergen Reactions  . Compazine [Prochlorperazine Edisylate] Swelling  . Zyrtec [Cetirizine] Swelling    Swelling of nose    Antimicrobials this admission:   >>    >>   Dose adjustments this admission:   Microbiology results:  BCx:   UCx:    Sputum:    MRSA PCR:   Thank you for allowing pharmacy to be a part of this patient's care.  Shaliah Wann D 11/03/2019 10:23 PM

## 2019-11-04 NOTE — H&P (Signed)
History and Physical    Amber Medina [id number] DOB: [birth year] DOA: 11/03/2019  PCP: [name], MD  Patient coming from: [facility]   Chief Complaint: abnormal labs  HPI: Amber Medina is a 69 y.o. female with medical history significant for metastatic colon cancer, paroxysmal a-fib, history pe/dvt in 2019, anemia, ckd 3, dCHF, morbid obesity, htn, who presents with above.  Recent admit, discharged 1/22, for near-syncope and debility.  Last chemotherapy infusion per patient 2-3 weeks ago.  Was discharged to SNF. She reports since discharge poor appetite and nausea. Says there is a tender swelling upper mid/left abdomen that has been bothering her. Feeling very weak. Has had brown/yellow diarrhea for several days, denies blood. No chest pain or shortness of breath. Denies fever. Denies bleeding/bruising. Denies new shortness of breath. Denies medication changes.   She was sent in today for abnormal labs at her skilled nursing facility  ED Course: labs, cxr, oncology consult ([place])  Review of Systems: As per HPI otherwise 10 point review of systems negative.    Past Medical History:  Diagnosis Date  . Abnormal pulmonary function test 02/2017  . Chronic diastolic (congestive) heart failure ([place]) 10/11/2015  . CKD (chronic kidney disease), stage II   . Colon cancer ([place])   . Hypertension   . Microcytic anemia    by labs 02/2017 -> adm 06/2017 with severe anemia, found to have colon CA.  . Morbid obesity ([place])   . Nocturnal hypoxemia   . Persistent atrial fibrillation ([place])   . Snoring   . Suspected sleep apnea     Past Surgical History:  Procedure Laterality Date  . COLONOSCOPY WITH PROPOFOL N/A 06/13/2017   Procedure: COLONOSCOPY WITH PROPOFOL;  Surgeon: Carol Ada, MD;  Location: [facility];  Service: Endoscopy;  Laterality: N/A;  . ESOPHAGOGASTRODUODENOSCOPY (EGD) WITH PROPOFOL N/A 06/13/2017   Procedure: ESOPHAGOGASTRODUODENOSCOPY (EGD) WITH PROPOFOL;   Surgeon: Carol Ada, MD;  Location: [facility];  Service: Endoscopy;  Laterality: N/A;  . IR IMAGING GUIDED PORT INSERTION  08/16/2019  . TONSILLECTOMY       reports that she has quit smoking. She has never used smokeless tobacco. She reports current alcohol use. She reports that she does not use drugs.  Allergies  Allergen Reactions  . Compazine [Prochlorperazine Edisylate] Swelling  . Zyrtec [Cetirizine] Swelling    Swelling of nose    Family History  Problem Relation Age of Onset  . Diabetes Mellitus II Mother   . Hypertension Mother   . Congenital heart disease Mother   . Hypertension Father     Prior to Admission medications   Medication Sig Start Date End Date Taking? Authorizing Provider  amiodarone (PACERONE) 100 MG tablet Take 1 tablet (100 mg total) by mouth at bedtime. Patient taking differently: Take 100 mg by mouth daily.  10/28/19  Yes Kayleen Memos, DO  diclofenac Sodium (VOLTAREN) 1 % GEL Apply 2 g topically 4 (four) times daily as needed (pain).   Yes [provider]  diltiazem (CARTIA XT) 240 MG 24 hr capsule Take 1 capsule (240 mg total) by mouth daily. Please make overdue annual appt for future refills. [phone]. 2nd attempt. Patient taking differently: Take 240 mg by mouth daily.  10/08/18  Yes Turner, Eber Hong, MD  ferrous gluconate (FERGON) 324 MG tablet Take 1 tablet (324 mg total) by mouth 3 (three) times daily with meals. 10/18/19  Yes Ladell Pier, MD  fluticasone (FLONASE) 50 MCG/ACT nasal spray Place 1 spray  into both nostrils daily. 08/23/19  Yes Mercy Riding, MD  HYDROcodone-acetaminophen (NORCO/VICODIN) 5-325 MG tablet Take 1 tablet by mouth every 4 (four) hours as needed for moderate pain. 10/11/2019  Yes Ladell Pier, MD  lidocaine-prilocaine (EMLA) cream Apply 1 application topically as directed. 08/30/19  Yes Ladell Pier, MD  loperamide (IMODIUM) 2 MG capsule Take 1 capsule (2 mg total) by mouth as needed for diarrhea or  loose stools (after each loose stool). 10/28/19  Yes Kayleen Memos, DO  loperamide (IMODIUM) 2 MG capsule Take 2 mg by mouth 2 (two) times daily.   Yes [provider]  loratadine (CLARITIN) 10 MG tablet Take 1 tablet (10 mg total) by mouth daily. 08/23/19 11/21/19 Yes Mercy Riding, MD  magic mouthwash SOLN Take 5 mLs by mouth 4 (four) times daily as needed for mouth pain (swish and spit). 08/30/19  Yes Ladell Pier, MD  metoprolol succinate (TOPROL-XL) 25 MG 24 hr tablet TAKE 1 TABLET BY MOUTH EVERY DAY. PLEASE MAKE AN APPOINTMENT WITH DOCTOR TURNER FOR FURHTER REFILLS. Patient taking differently: Take 25 mg by mouth daily.  12/13/18  Yes Turner, Eber Hong, MD  ondansetron (ZOFRAN) 4 MG tablet Take 8 mg by mouth daily.   Yes [provider]  ondansetron (ZOFRAN) 8 MG tablet Take 1 tablet (8 mg total) by mouth every 8 (eight) hours as needed for nausea or vomiting. 08/30/19  Yes Ladell Pier, MD  rivaroxaban (XARELTO) 20 MG TABS tablet Take 1 tablet (20 mg total) by mouth daily with supper. 10/18/19  Yes Ladell Pier, MD    Physical Exam: Vitals:   10/09/2019 2100 10/21/2019 2125 10/27/2019 2130 10/17/2019 2202  BP:  (!) 108/48 (!) 139/51 113/65  Pulse: 92 96 96 91  Resp: (!) 23 (!) 25 15 (!) 24  Temp:  99.6 F (37.6 C)  99.7 F (37.6 C)  TempSrc:      SpO2: 99% 100% 97% 95%  Weight:        Constitutional: No acute distress, chronically ill appearing Head: Atraumatic Eyes: Conjunctiva clear ENM: Moist mucous membranes. poordentition.  Neck: Supple Respiratory: Clear to auscultation bilaterally, no wheezing/rhonchi. Poor respiratory effort. No accessory muscle use. Rales at bases. Cardiovascular: Regular rate and rhythm. Soft systolic murmur Abdomen: obese, ttp throughout greatest upper mid/left abdomen, some firmness felt there Musculoskeletal: No joint deformity upper and lower extremities. Normal ROM, no contractures. Decreased muscle tone Skin: stage 2 decubitus  ulcer several inches from rectum in a ring. Very pale skin Extremities: trace LE peripheral edema. Palpable peripheral pulses. Neurologic: Alert, moving all 4 extremities. Psychiatric: Normal insight and judgement.  Lines: Right subclavian   Labs on Admission: I have personally reviewed following labs and imaging studies  CBC: Recent Labs  Lab 10/15/2019 1944  WBC 0.5*  NEUTROABS 0.2*  HGB 6.2*  HCT 21.5*  MCV 96.4  PLT 22*   Basic Metabolic Panel: Recent Labs  Lab 10/14/2019 1944  NA 131*  K 4.0  CL 102  CO2 19*  GLUCOSE 138*  BUN 34*  CREATININE 1.46*  CALCIUM 8.0*   GFR: Estimated Creatinine Clearance: 50.5 mL/min (A) (by C-G formula based on SCr of 1.46 mg/dL (H)). Liver Function Tests: Recent Labs  Lab 10/16/2019 1944  AST 12*  ALT 11  ALKPHOS 149*  BILITOT 2.5*  PROT 6.1*  ALBUMIN 1.5*   No results for input(s): LIPASE, AMYLASE in the last 168 hours. No results for input(s): AMMONIA in the  last 168 hours. Coagulation Profile: Recent Labs  Lab 10/14/2019 1944  INR 1.9*   Cardiac Enzymes: No results for input(s): CKTOTAL, CKMB, CKMBINDEX, TROPONINI in the last 168 hours. BNP (last 3 results) No results for input(s): PROBNP in the last 8760 hours. HbA1C: No results for input(s): HGBA1C in the last 72 hours. CBG: No results for input(s): GLUCAP in the last 168 hours. Lipid Profile: No results for input(s): CHOL, HDL, LDLCALC, TRIG, CHOLHDL, LDLDIRECT in the last 72 hours. Thyroid Function Tests: No results for input(s): TSH, T4TOTAL, FREET4, T3FREE, THYROIDAB in the last 72 hours. Anemia Panel: No results for input(s): VITAMINB12, FOLATE, FERRITIN, TIBC, IRON, RETICCTPCT in the last 72 hours. Urine analysis:    Component Value Date/Time   COLORURINE AMBER (A) 08/13/2019 0222   APPEARANCEUR CLOUDY (A) 08/13/2019 0222   LABSPEC 1.012 08/13/2019 0222   PHURINE 5.0 08/13/2019 0222   GLUCOSEU 50 (A) 08/13/2019 0222   HGBUR SMALL (A) 08/13/2019 0222    BILIRUBINUR NEGATIVE 08/13/2019 0222   KETONESUR NEGATIVE 08/13/2019 0222   PROTEINUR 100 (A) 08/13/2019 0222   NITRITE NEGATIVE 08/13/2019 0222   LEUKOCYTESUR NEGATIVE 08/13/2019 0222    Radiological Exams on Admission: DG Chest 1 View  Result Date: 11/06/2019 CLINICAL DATA:  Dyspnea EXAM: CHEST  1 VIEW COMPARISON:  None. FINDINGS: A right Port-A-Cath terminates in the right atrium, stable. No pneumothorax. Haziness over the right hemithorax seen on two views. There is significant patient rotation. No focal infiltrate seen on the left. The cardiomediastinal silhouette is stable. IMPRESSION: The study is limited due to patient body habitus and significant patient rotation. Haziness over the right hemithorax could be due to patient rotation. A layering effusion or asymmetric edema could have a similar appearance. Recommend a better position chest x-ray for further evaluation. No other acute abnormalities. Electronically Signed   By: Dorise Bullion III M.D   On: 11/05/2019 21:15    EKG: Independently reviewed. nsr  Assessment/Plan Principal Problem:   Pancytopenia ([place]) Active Problems:   Obesity, morbid (HCC)   GI bleed   Personal history of DVT (deep vein thrombosis)   Ascending aortic aneurysm (HCC)   Pancytopenia due to chemotherapy ([place])   # Pancytopenia - plts 22, wbc 0.5, h 6.2 (from recent baseline 8-9). inr 1.9. t bili 2.5. Oncology consulted, this thought to be likely reaction to systemic chemotherapy. Last infusion per patient of folfiri/avastin regimen 2-3 wks ago. GI bleed with hemoccult positive stool in ED, melanotic stool. Per EDP oncology has advised product replacement. I will also cover with cefepime. New tender mass abdominal wall could represent intraabdominal bleeding. Hemodynamically stable. - 1 unit ffp, 1 unit plts, 2 units prbcs ordered - cefepime - repeat cbc at midnight, again in AM - step-down status - appreciate oncology consult formal recs which are  pending - consider CT abdomen tomorrow assuming renal function improves - hold rivaroxaban - npo sips w/ meds for now - full-code - f/u covid  # GI bleed - baseline hgb 8-9, here 6.2, unclear to what extent this is 2/2 GI bleed vs cytotoxicity from chemotherapy. Pt denies melanotic or brb stool but ED reports melanotic stool and hemoccult positive. - monitor bleeding, trend h/h - protonix 40 mg IV bid ordered  # Acute kidney injury - cr 1.46 from recent value of 0.9. likely 2/2 diarrhea and decreased po - blood products as above - LR @ 100; AM cmp  # Paroxysmal a fib - here in sinus - cont home metop  and amio (noted thrombocytopenia a poss side effect) - hold diltiazem  # Colon adenocarcinoma - metastatic.   # diarrhea - likely 2/2 chemo. Was recently hospitalized and received abx - cont home loperamide, holding on c diff eval for time being  # history pe/dvt - hold rivaroxaban, pt aware with current problem at risk for bleeding and thrombosis  # Stage 2 decubitus ulcer near rectum - wound care  # dCHF - appears compensated    DVT prophylaxis: scds Code Status: full  Family Communication: daughter  Disposition Plan: tbd  Consults called: oncology  Admission status: step-down    Desma Maxim MD [facility] Pager [phone]  If 7PM-7AM, please contact night-coverage [email or web address] Password [email or web address]  10/18/2019, 10:20 PM

## 2019-11-05 ENCOUNTER — Inpatient Hospital Stay: Payer: Medicare HMO

## 2019-11-05 DIAGNOSIS — K922 Gastrointestinal hemorrhage, unspecified: Secondary | ICD-10-CM

## 2019-11-05 DIAGNOSIS — C799 Secondary malignant neoplasm of unspecified site: Secondary | ICD-10-CM

## 2019-11-05 DIAGNOSIS — R579 Shock, unspecified: Secondary | ICD-10-CM

## 2019-11-05 DIAGNOSIS — D61810 Antineoplastic chemotherapy induced pancytopenia: Principal | ICD-10-CM

## 2019-11-05 DIAGNOSIS — R140 Abdominal distension (gaseous): Secondary | ICD-10-CM

## 2019-11-05 DIAGNOSIS — D61818 Other pancytopenia: Secondary | ICD-10-CM

## 2019-11-05 LAB — CBC WITH DIFFERENTIAL/PLATELET
Abs Immature Granulocytes: 0 10*3/uL (ref 0.00–0.07)
Abs Immature Granulocytes: 0 10*3/uL (ref 0.00–0.07)
Abs Immature Granulocytes: 0 10*3/uL (ref 0.00–0.07)
Abs Immature Granulocytes: 0 10*3/uL (ref 0.00–0.07)
Basophils Absolute: 0 10*3/uL (ref 0.0–0.1)
Basophils Absolute: 0 10*3/uL (ref 0.0–0.1)
Basophils Absolute: 0 10*3/uL (ref 0.0–0.1)
Basophils Absolute: 0 10*3/uL (ref 0.0–0.1)
Basophils Relative: 0 %
Basophils Relative: 0 %
Basophils Relative: 0 %
Basophils Relative: 0 %
Eosinophils Absolute: 0 10*3/uL (ref 0.0–0.5)
Eosinophils Absolute: 0 10*3/uL (ref 0.0–0.5)
Eosinophils Absolute: 0 10*3/uL (ref 0.0–0.5)
Eosinophils Absolute: 0 10*3/uL (ref 0.0–0.5)
Eosinophils Relative: 0 %
Eosinophils Relative: 0 %
Eosinophils Relative: 0 %
Eosinophils Relative: 0 %
HCT: 22.2 % — ABNORMAL LOW (ref 36.0–46.0)
HCT: 24.7 % — ABNORMAL LOW (ref 36.0–46.0)
HCT: 25.8 % — ABNORMAL LOW (ref 36.0–46.0)
HCT: 26.5 % — ABNORMAL LOW (ref 36.0–46.0)
Hemoglobin: 6.8 g/dL — ABNORMAL LOW (ref 12.0–15.0)
Hemoglobin: 7.6 g/dL — ABNORMAL LOW (ref 12.0–15.0)
Hemoglobin: 8.3 g/dL — ABNORMAL LOW (ref 12.0–15.0)
Hemoglobin: 8.3 g/dL — ABNORMAL LOW (ref 12.0–15.0)
Immature Granulocytes: 0 %
Immature Granulocytes: 0 %
Immature Granulocytes: 0 %
Immature Granulocytes: 0 %
Lymphocytes Relative: 69 %
Lymphocytes Relative: 69 %
Lymphocytes Relative: 74 %
Lymphocytes Relative: 74 %
Lymphs Abs: 0.4 10*3/uL — ABNORMAL LOW (ref 0.7–4.0)
Lymphs Abs: 0.3 10*3/uL — ABNORMAL LOW (ref 0.7–4.0)
Lymphs Abs: 0.3 10*3/uL — ABNORMAL LOW (ref 0.7–4.0)
Lymphs Abs: 0.4 10*3/uL — ABNORMAL LOW (ref 0.7–4.0)
MCH: 29.1 pg (ref 26.0–34.0)
MCH: 28.5 pg (ref 26.0–34.0)
MCH: 28.9 pg (ref 26.0–34.0)
MCH: 29.4 pg (ref 26.0–34.0)
MCHC: 30.6 g/dL (ref 30.0–36.0)
MCHC: 30.8 g/dL (ref 30.0–36.0)
MCHC: 31.3 g/dL (ref 30.0–36.0)
MCHC: 32.2 g/dL (ref 30.0–36.0)
MCV: 94.9 fL (ref 80.0–100.0)
MCV: 92.5 fL (ref 80.0–100.0)
MCV: 91.5 fL (ref 80.0–100.0)
MCV: 92.3 fL (ref 80.0–100.0)
Monocytes Absolute: 0 10*3/uL — ABNORMAL LOW (ref 0.1–1.0)
Monocytes Absolute: 0 10*3/uL — ABNORMAL LOW (ref 0.1–1.0)
Monocytes Absolute: 0 10*3/uL — ABNORMAL LOW (ref 0.1–1.0)
Monocytes Absolute: 0 10*3/uL — ABNORMAL LOW (ref 0.1–1.0)
Monocytes Relative: 7 %
Monocytes Relative: 7 %
Monocytes Relative: 9 %
Monocytes Relative: 9 %
Neutro Abs: 0.1 10*3/uL — ABNORMAL LOW (ref 1.7–7.7)
Neutro Abs: 0.1 10*3/uL — ABNORMAL LOW (ref 1.7–7.7)
Neutro Abs: 0.1 10*3/uL — ABNORMAL LOW (ref 1.7–7.7)
Neutro Abs: 0.1 10*3/uL — ABNORMAL LOW (ref 1.7–7.7)
Neutrophils Relative %: 24 %
Neutrophils Relative %: 24 %
Neutrophils Relative %: 17 %
Neutrophils Relative %: 17 %
Platelets: 37 10*3/uL — ABNORMAL LOW (ref 150–400)
Platelets: 45 10*3/uL — ABNORMAL LOW (ref 150–400)
Platelets: 57 10*3/uL — ABNORMAL LOW (ref 150–400)
Platelets: 58 10*3/uL — ABNORMAL LOW (ref 150–400)
RBC: 2.34 MIL/uL — ABNORMAL LOW (ref 3.87–5.11)
RBC: 2.67 MIL/uL — ABNORMAL LOW (ref 3.87–5.11)
RBC: 2.82 MIL/uL — ABNORMAL LOW (ref 3.87–5.11)
RBC: 2.87 MIL/uL — ABNORMAL LOW (ref 3.87–5.11)
RDW: 16.5 % — ABNORMAL HIGH (ref 11.5–15.5)
RDW: 17.6 % — ABNORMAL HIGH (ref 11.5–15.5)
RDW: 17.1 % — ABNORMAL HIGH (ref 11.5–15.5)
RDW: 17.2 % — ABNORMAL HIGH (ref 11.5–15.5)
Smear Review: NORMAL
WBC: 0.6 10*3/uL — CL (ref 4.0–10.5)
WBC: 0.5 10*3/uL — CL (ref 4.0–10.5)
WBC: 0.5 10*3/uL — CL (ref 4.0–10.5)
WBC: 0.5 10*3/uL — CL (ref 4.0–10.5)
nRBC: 0 % (ref 0.0–0.2)
nRBC: 0 % (ref 0.0–0.2)
nRBC: 0 % (ref 0.0–0.2)
nRBC: 0 % (ref 0.0–0.2)

## 2019-11-05 LAB — PREPARE FRESH FROZEN PLASMA: Unit division: 0

## 2019-11-05 LAB — COMPREHENSIVE METABOLIC PANEL
ALT: 9 U/L (ref 0–44)
AST: 11 U/L — ABNORMAL LOW (ref 15–41)
Albumin: 1.6 g/dL — ABNORMAL LOW (ref 3.5–5.0)
Alkaline Phosphatase: 118 U/L (ref 38–126)
Anion gap: 9 (ref 5–15)
BUN: 39 mg/dL — ABNORMAL HIGH (ref 8–23)
CO2: 19 mmol/L — ABNORMAL LOW (ref 22–32)
Calcium: 7.6 mg/dL — ABNORMAL LOW (ref 8.9–10.3)
Chloride: 105 mmol/L (ref 98–111)
Creatinine, Ser: 1.58 mg/dL — ABNORMAL HIGH (ref 0.44–1.00)
GFR calc Af Amer: 38 mL/min — ABNORMAL LOW (ref >60)
GFR calc non Af Amer: 33 mL/min — ABNORMAL LOW (ref >60)
Glucose, Bld: 149 mg/dL — ABNORMAL HIGH (ref 70–99)
Potassium: 4 mmol/L (ref 3.5–5.1)
Sodium: 133 mmol/L — ABNORMAL LOW (ref 135–145)
Total Bilirubin: 3 mg/dL — ABNORMAL HIGH (ref 0.3–1.2)
Total Protein: 5.5 g/dL — ABNORMAL LOW (ref 6.5–8.1)

## 2019-11-05 LAB — BPAM FFP
Blood Product Expiration Date: 202102032359
ISSUE DATE / TIME: 202101292145
Unit Type and Rh: 5100

## 2019-11-05 LAB — PROCALCITONIN: Procalcitonin: 6.31 ng/mL

## 2019-11-05 LAB — PROTIME-INR
INR: 2.8 — ABNORMAL HIGH (ref 0.8–1.2)
INR: 2.2 — ABNORMAL HIGH (ref 0.8–1.2)
Prothrombin Time: 29.4 seconds — ABNORMAL HIGH (ref 11.4–15.2)
Prothrombin Time: 24 seconds — ABNORMAL HIGH (ref 11.4–15.2)

## 2019-11-05 LAB — PREPARE RBC (CROSSMATCH)

## 2019-11-05 LAB — MRSA PCR SCREENING: MRSA by PCR: NEGATIVE

## 2019-11-05 LAB — MAGNESIUM: Magnesium: 1.7 mg/dL (ref 1.7–2.4)

## 2019-11-05 LAB — FIBRIN DERIVATIVES D-DIMER (ARMC ONLY): Fibrin derivatives D-dimer (ARMC): 6387.98 ng/mL (FEU) — ABNORMAL HIGH (ref 0.00–499.00)

## 2019-11-05 LAB — LACTIC ACID, PLASMA: Lactic Acid, Venous: 1.4 mmol/L (ref 0.5–1.9)

## 2019-11-05 LAB — FIBRINOGEN: Fibrinogen: >750 mg/dL — ABNORMAL HIGH (ref 210–475)

## 2019-11-05 MED ORDER — CHLORHEXIDINE GLUCONATE CLOTH 2 % EX PADS
6.0000 | MEDICATED_PAD | Freq: Every day | CUTANEOUS | Status: DC
  Administered 2019-11-07: 08:00:00 6 via TOPICAL

## 2019-11-05 MED ORDER — SODIUM CHLORIDE 0.9% IV SOLUTION: Freq: Once | INTRAVENOUS | Status: DC

## 2019-11-05 MED ORDER — NOREPINEPHRINE 4 MG/250ML-% IV SOLN
2.0000 ug/min | INTRAVENOUS | Status: DC
  Administered 2019-11-05: 2 ug/min via INTRAVENOUS
  Filled 2019-11-05: qty 250

## 2019-11-05 MED ORDER — MORPHINE SULFATE (PF) 2 MG/ML IV SOLN
1.0000 mg | INTRAVENOUS | Status: DC | PRN
  Administered 2019-11-05: 2 mg via INTRAVENOUS
  Administered 2019-11-05: 1 mg via INTRAVENOUS
  Administered 2019-11-06 (×2): 2 mg via INTRAVENOUS
  Filled 2019-11-05 (×5): qty 1

## 2019-11-05 MED ORDER — NOREPINEPHRINE 4 MG/250ML-% IV SOLN: 0.0000 ug/min | INTRAVENOUS | Status: DC

## 2019-11-05 MED ORDER — LACTATED RINGERS IV BOLUS
1000.0000 mL | Freq: Once | INTRAVENOUS | Status: AC
  Administered 2019-11-05: 1000 mL via INTRAVENOUS

## 2019-11-05 MED ORDER — SODIUM CHLORIDE 0.9 % IV BOLUS
500.0000 mL | Freq: Once | INTRAVENOUS | Status: AC
  Administered 2019-11-05: 500 mL via INTRAVENOUS

## 2019-11-05 MED ORDER — STERILE WATER FOR INJECTION IJ SOLN
INTRAMUSCULAR | Status: AC
  Administered 2019-11-05: 10 mL
  Filled 2019-11-05: qty 10

## 2019-11-05 MED ORDER — NOREPINEPHRINE 16 MG/250ML-% IV SOLN
0.0000 ug/min | INTRAVENOUS | Status: DC
  Administered 2019-11-05: 10 ug/min via INTRAVENOUS
  Administered 2019-11-05: 6 ug/min via INTRAVENOUS
  Filled 2019-11-05 (×4): qty 250

## 2019-11-05 MED ORDER — MAGNESIUM SULFATE 2 GM/50ML IV SOLN
2.0000 g | Freq: Once | INTRAVENOUS | Status: AC
  Administered 2019-11-05: 2 g via INTRAVENOUS
  Filled 2019-11-05: qty 50

## 2019-11-05 MED ORDER — SODIUM CHLORIDE 0.9 % IV SOLN: 250.0000 mL | INTRAVENOUS | Status: DC

## 2019-11-05 NOTE — Consult Note (Signed)
Amber Lame, MD [facility]  [address], [place],  [address] Phone: [phone] Fax : [phone]  Consultation  Referring Provider:     Dr. Arbutus Ped Primary Care Physician:  [name], MD Primary Gastroenterologist:  Dr.  Benson Norway        Reason for Consultation:     Melena  Date of Admission:  10/20/2019 Date of Consultation:  11/05/2019         HPI:   Amber Medina is a 69 y.o. female who has a history of metastatic colon cancer.  The patient had been seen at [facility] and at [facility] and had her colonoscopy by Dr. Benson Norway.  The patient reports that she followed up with oncology and was given a clean bill of health and told she did not have any sign of cancer.  The patient then presented in November the hospital again and was discovered to have metastatic stage IV cancer.  The patient's last chemotherapy was 2 to 3 weeks ago.  The patient reports that she has been having diarrhea for approximately 1 week although she has had no further diarrhea while in the ICU.  She was in the hospital on January 22 for near syncope and then discharged to a skilled nursing facility.  The patient started to have nausea and vomiting with a distended tender abdomen and she reports feeling weak so she came back to the hospital.  The patient was reporting that her vomitus was first green and then started to turn orange/red and then dark.  It was thought that the patient's vomitus in the hospital in this admission looked and smelled like stool and the patient had an NG tube placed and a GI consult and surgical consults were called.  Patient had a CT scan of the abdomen back on the 15th that showed:  IMPRESSION: 1. Significant progression of metastatic hepatic disease suggesting a very aggressive neoplasm. 2. Enlarging omental and peritoneal surface lesions. 3. Small pulmonary nodules consistent with pulmonary metastatic Disease.  They did not repeat the CT scan on this admission but had an  ultrasound that showed:.  IMPRESSION: 1. Moderate diffuse heterogeneity of the liver compatible with recent CT findings of progression of metastatic disease of patient's known colon carcinoma. 2. Moderate gallbladder sludge with nonspecific gallbladder wall edema measuring 7-8 mm. Recommend clinical correlation for acute cholecystitis.  The patient's D-dimer was 6387 with an elevated fibrinogen of greater than 750.  The patient had pancytopenia with a hemoglobin of 7.6 with a white cell count of 0.5.  The patient's hemoglobin is consistent with her previous hemoglobins over the last few weeks.  The patient's lactic acid was normal today and she has C. difficile orders but has not had any further diarrhea.  The NG tube has drained 500 cc of dark material.  The patient's AST and ALT were normal with a slightly elevated alkaline phosphatase at 149 and a total bilirubin of 2.5.  The patient's common bile duct was 5.9.  Past Medical History:  Diagnosis Date  . Abnormal pulmonary function test 02/2017  . Chronic diastolic (congestive) heart failure ([place]) 10/11/2015  . CKD (chronic kidney disease), stage II   . Colon cancer ([place])   . Hypertension   . Microcytic anemia    by labs 02/2017 -> adm 06/2017 with severe anemia, found to have colon CA.  . Morbid obesity ([place])   . Nocturnal hypoxemia   . Persistent atrial fibrillation ([place])   . Snoring   .  Suspected sleep apnea     Past Surgical History:  Procedure Laterality Date  . COLONOSCOPY WITH PROPOFOL N/A 06/13/2017   Procedure: COLONOSCOPY WITH PROPOFOL;  Surgeon: Carol Ada, MD;  [facility];  Service: Endoscopy;  Laterality: N/A;  . ESOPHAGOGASTRODUODENOSCOPY (EGD) WITH PROPOFOL N/A 06/13/2017   Procedure: ESOPHAGOGASTRODUODENOSCOPY (EGD) WITH PROPOFOL;  Surgeon: Carol Ada, MD;  [facility];  Service: Endoscopy;  Laterality: N/A;  . IR IMAGING GUIDED PORT INSERTION  08/16/2019  . TONSILLECTOMY      Prior to  Admission medications   Medication Sig Start Date End Date Taking? Authorizing Provider  amiodarone (PACERONE) 100 MG tablet Take 1 tablet (100 mg total) by mouth at bedtime. Patient taking differently: Take 100 mg by mouth daily.  10/28/19  Yes Kayleen Memos, DO  diclofenac Sodium (VOLTAREN) 1 % GEL Apply 2 g topically 4 (four) times daily as needed (pain).   Yes [provider]  diltiazem (CARTIA XT) 240 MG 24 hr capsule Take 1 capsule (240 mg total) by mouth daily. Please make overdue annual appt for future refills. [phone]. 2nd attempt. Patient taking differently: Take 240 mg by mouth daily.  10/08/18  Yes Turner, Eber Hong, MD  ferrous gluconate (FERGON) 324 MG tablet Take 1 tablet (324 mg total) by mouth 3 (three) times daily with meals. 10/18/19  Yes Ladell Pier, MD  fluticasone (FLONASE) 50 MCG/ACT nasal spray Place 1 spray into both nostrils daily. 08/23/19  Yes Mercy Riding, MD  HYDROcodone-acetaminophen (NORCO/VICODIN) 5-325 MG tablet Take 1 tablet by mouth every 4 (four) hours as needed for moderate pain. 10/21/2019  Yes Ladell Pier, MD  lidocaine-prilocaine (EMLA) cream Apply 1 application topically as directed. 08/30/19  Yes Ladell Pier, MD  loperamide (IMODIUM) 2 MG capsule Take 1 capsule (2 mg total) by mouth as needed for diarrhea or loose stools (after each loose stool). 10/28/19  Yes Kayleen Memos, DO  loperamide (IMODIUM) 2 MG capsule Take 2 mg by mouth 2 (two) times daily.   Yes [provider]  loratadine (CLARITIN) 10 MG tablet Take 1 tablet (10 mg total) by mouth daily. 08/23/19 11/21/19 Yes Mercy Riding, MD  magic mouthwash SOLN Take 5 mLs by mouth 4 (four) times daily as needed for mouth pain (swish and spit). 08/30/19  Yes Ladell Pier, MD  metoprolol succinate (TOPROL-XL) 25 MG 24 hr tablet TAKE 1 TABLET BY MOUTH EVERY DAY. PLEASE MAKE AN APPOINTMENT WITH DOCTOR TURNER FOR FURHTER REFILLS. Patient taking differently: Take 25 mg by mouth  daily.  12/13/18  Yes Turner, Eber Hong, MD  ondansetron (ZOFRAN) 4 MG tablet Take 8 mg by mouth daily.   Yes [provider]  ondansetron (ZOFRAN) 8 MG tablet Take 1 tablet (8 mg total) by mouth every 8 (eight) hours as needed for nausea or vomiting. 08/30/19  Yes Ladell Pier, MD  rivaroxaban (XARELTO) 20 MG TABS tablet Take 1 tablet (20 mg total) by mouth daily with supper. 10/18/19  Yes Ladell Pier, MD    Family History  Problem Relation Age of Onset  . Diabetes Mellitus II Mother   . Hypertension Mother   . Congenital heart disease Mother   . Hypertension Father      Social History   Tobacco Use  . Smoking status: Former Research scientist (life sciences)  . Smokeless tobacco: Never Used  . Tobacco comment: quit in 2013  Substance Use Topics  . Alcohol use: Yes    Comment: occ  .  Drug use: No    Allergies as of 10/21/2019 - Review Complete 10/08/2019  Allergen Reaction Noted  . Compazine [prochlorperazine edisylate] Swelling 09/06/2015  . Zyrtec [cetirizine] Swelling 08/30/2019    Review of Systems:    All systems reviewed and negative except where noted in HPI.   Physical Exam:  Vital signs in last 24 hours: Temp:  [97.5 F (36.4 C)-99.7 F (37.6 C)] 98.3 F (36.8 C) (01/30 1200) Pulse Rate:  [70-98] 80 (01/30 1215) Resp:  [13-26] 15 (01/30 1215) BP: (43-139)/(29-97) 129/97 (01/30 1215) SpO2:  [91 %-100 %] 94 % (01/30 1215) Weight:  [137.3 kg] 137.3 kg (01/29 1928) Last BM Date: 10/23/2019 General:   Pleasant, cooperative and appears uncomfortable with an NG tube in her nose Head:  Normocephalic and atraumatic. Eyes:   No icterus.   Conjunctiva pink. PERRLA. Ears:  Normal auditory acuity. Neck:  Supple; no masses or thyroidomegaly Lungs: Respirations even and unlabored. Lungs clear to auscultation bilaterally.   No wheezes, crackles, or rhonchi.  Heart:  Regular rate and rhythm;  Without murmur, clicks, rubs or gallops Abdomen: Distended, tympanic with decreased bowel  sounds. No appreciable masses or hepatomegaly.  No rebound or guarding.  Rectal:  Not performed. Msk:  Symmetrical without gross deformities.    Extremities:  Without edema, cyanosis or clubbing. Neurologic:  Alert and oriented x3;  grossly normal neurologically. Skin:  Intact without significant lesions or rashes. Cervical Nodes:  No significant cervical adenopathy. Psych:  Alert and cooperative. Normal affect.  LAB RESULTS: Recent Labs    11/02/2019 1944 11/05/19 0518 11/05/19 1147  WBC 0.5* 0.6* 0.5*  HGB 6.2* 6.8* 7.6*  HCT 21.5* 22.2* 24.7*  PLT 22* 37* 45*   BMET Recent Labs    10/29/2019 1944 11/05/19 0518  NA 131* 133*  K 4.0 4.0  CL 102 105  CO2 19* 19*  GLUCOSE 138* 149*  BUN 34* 39*  CREATININE 1.46* 1.58*  CALCIUM 8.0* 7.6*   LFT Recent Labs    11/05/19 0518  PROT 5.5*  ALBUMIN 1.6*  AST 11*  ALT 9  ALKPHOS 118  BILITOT 3.0*   PT/INR Recent Labs    10/16/2019 1944 11/05/19 0518  LABPROT 21.4* 29.4*  INR 1.9* 2.8*    STUDIES: DG Chest 1 View  Result Date: 10/16/2019 CLINICAL DATA:  Dyspnea EXAM: CHEST  1 VIEW COMPARISON:  None. FINDINGS: A right Port-A-Cath terminates in the right atrium, stable. No pneumothorax. Haziness over the right hemithorax seen on two views. There is significant patient rotation. No focal infiltrate seen on the left. The cardiomediastinal silhouette is stable. IMPRESSION: The study is limited due to patient body habitus and significant patient rotation. Haziness over the right hemithorax could be due to patient rotation. A layering effusion or asymmetric edema could have a similar appearance. Recommend a better position chest x-ray for further evaluation. No other acute abnormalities. Electronically Signed   By: Dorise Bullion III M.D   On: 11/06/2019 21:15   US Abdomen Complete  Result Date: 11/05/2019 CLINICAL DATA:  Abdominal distension. History of metastatic colon cancer. EXAM: ABDOMEN ULTRASOUND COMPLETE COMPARISON:  CT  10/21/2019 FINDINGS: Exam limited due to patient's inability to move, bowel gas and body habitus. Gallbladder: Evidence of gallbladder sludge without discrete stones. Gallbladder wall edema measuring 7-8 mm. Small amount of pericholecystic fluid. Negative sonographic Murphy sign. Common bile duct: Diameter: 5.9 mm. Liver: Moderate diffuse heterogeneity compatible recent CT findings of worsening diffuse metastatic disease. Portal vein is patent on  color Doppler imaging with normal direction of blood flow towards the liver. IVC: No abnormality visualized. Pancreas: Visualized portion unremarkable. Spleen: Size and appearance within normal limits. Right Kidney: Length: 11.5 cm. Echogenicity within normal limits. No mass or hydronephrosis visualized. Left Kidney: Length: Not visualized. Abdominal aorta: No aneurysm visualized. Other findings: None. IMPRESSION: 1. Moderate diffuse heterogeneity of the liver compatible with recent CT findings of progression of metastatic disease of patient's known colon carcinoma. 2. Moderate gallbladder sludge with nonspecific gallbladder wall edema measuring 7-8 mm. Recommend clinical correlation for acute cholecystitis. Electronically Signed   By: Marin Olp M.D.   On: 11/05/2019 09:54   DG Chest Port 1 View  Result Date: 11/05/2019 CLINICAL DATA:  Shortness of breath. EXAM: PORTABLE CHEST 1 VIEW COMPARISON:  Chest radiograph 10/21/2019 FINDINGS: Right anterior chest wall Port-A-Cath is present with tip projecting over the right atrium. Stable cardiomegaly. Aortic atherosclerosis. Improved aeration of the right lung with persistent hazy opacification. No definite pleural effusion or pneumothorax. Thoracic spine degenerative changes. IMPRESSION: Improved hazy opacities over the right lung may be secondary to overlying soft tissues. Underlying effusion or pulmonary opacities are not excluded. Electronically Signed   By: Lovey Newcomer M.D.   On: 11/05/2019 06:34      Impression /  Plan:   Assessment: Principal Problem:   Pancytopenia ([place]) Active Problems:   Obesity, morbid (HCC)   GI bleed   Personal history of DVT (deep vein thrombosis)   Ascending aortic aneurysm (HCC)   Pancytopenia due to chemotherapy Amber Medina)   Amber Medina is a 69 y.o. y/o female with metastatic colon cancer with a normal common bile duct and no sign of extrahepatic biliary obstruction.  The patient does have pancytopenia and dark material coming from her NG tube.  The patient has an absence of bowel sounds and clinically appears obstructed.  Plan:  The patient has a surgery consult pending.  I have left a message with the ICU attending about consideration for a CT scan of the abdomen to look for a transition site for possible obstruction.  I will not order it myself due to the logistics of the patient being out of the ICU for this procedure and need for close monitoring.  With an obstructive pattern a upper endoscopy is not warranted at the present time.  The NG tube has very little output at this time and the patient has had no further sign of melena.  The patient has been explained the plan and agrees with it.  Thank you for involving me in the care of this patient.      LOS: 1 day   Amber Lame, MD  11/05/2019, 12:34 PM Pager [phone] 7am-5pm  Check AMION for 5pm -7am coverage and on weekends   Note: This dictation was prepared with Dragon dictation along with smaller phrase technology. Any transcriptional errors that result from this process are unintentional.

## 2019-11-05 NOTE — Consult Note (Signed)
Name: Amber Medina MRN: [id number] DOB: August 30, 1951    ADMISSION DATE:  10/07/2019 CONSULTATION DATE: 11/05/2019  REFERRING MD : Sharion Settler, NP   CHIEF COMPLAINT: Hypotension   BRIEF PATIENT DESCRIPTION:  69 yo female with metastatic colon cancer to the liver receiving palliative chemotherapy admitted with hypotension and pancytopenia secondary to acute GI bleed (on xarelto) and hypovolemia due to poor po intake requiring levophed gtt   SIGNIFICANT EVENTS/STUDIES:  01/29: Pt admitted to the stepdown unit with pancytopenia secondary to GI bleed and chemotherapy treatment  01/30: Pt transferred to ICU due to hypotension despite multiple blood products requiring levophed gtt   HISTORY OF PRESENT ILLNESS:  This is a 69 yo female with a PMH of Suspected Sleep Apnea, Persistent Atrial Fibrillation, Nocturnal Hypoxemia, Morbid Obesity, Microcytic Anemia, HTN, Chronic Diastolic CHF, Pulmonary Embolism and Left Popliteal DVT (dx 02/2018), Morbid Obesity, Stage II CKD, and Metastatic Colon Cancer to Liver (dx: 08/2019 currently receiving palliative chemotherapy last treatment 10/2019).  She presented to [facility] ER on 01/29 via EMS from [facility] with vomiting, diarrhea, and abnormal lab values.  In the ER lab results revealed Na+ 131, CO2 19, BUN 34, creatinine 1.46, alk phos 149, albumin 1.5, wbc 0.5, platelets 22, influenza pcr negative, and COVID-19 negative. Pt does take xarelto in the outpatient setting, and in ER stool guaiac positive although she denied bloody/tarry stools. Vital signs in the ER were stable. ER physician placed orders for pt to receive 1 unit of FFP, 1 unit of radiated platelets, and 2 units of pRBC's.  She was subsequently admitted to the stepdown unit per hospitalist team for additional workup and treatment.  On 01/30 despite multiple administration of blood products and iv fluid boluses she remained hypotensive requiring levophed gtt PCCM consulted for additional management.     PAST MEDICAL HISTORY :   has a past medical history of Abnormal pulmonary function test (02/2017), Chronic diastolic (congestive) heart failure ([place]) (10/11/2015), CKD (chronic kidney disease), stage II, Colon cancer ([place]), Hypertension, Microcytic anemia, Morbid obesity ([place]), Nocturnal hypoxemia, Persistent atrial fibrillation ([place]), Snoring, and Suspected sleep apnea.  has a past surgical history that includes Tonsillectomy; Colonoscopy with propofol (N/A, 06/13/2017); Esophagogastroduodenoscopy (egd) with propofol (N/A, 06/13/2017); and IR IMAGING GUIDED PORT INSERTION (08/16/2019). Prior to Admission medications   Medication Sig Start Date End Date Taking? Authorizing Provider  amiodarone (PACERONE) 100 MG tablet Take 1 tablet (100 mg total) by mouth at bedtime. Patient taking differently: Take 100 mg by mouth daily.  10/28/19  Yes Kayleen Memos, DO  diclofenac Sodium (VOLTAREN) 1 % GEL Apply 2 g topically 4 (four) times daily as needed (pain).   Yes [provider]  diltiazem (CARTIA XT) 240 MG 24 hr capsule Take 1 capsule (240 mg total) by mouth daily. Please make overdue annual appt for future refills. [phone]. 2nd attempt. Patient taking differently: Take 240 mg by mouth daily.  10/08/18  Yes Turner, Eber Hong, MD  ferrous gluconate (FERGON) 324 MG tablet Take 1 tablet (324 mg total) by mouth 3 (three) times daily with meals. 10/18/19  Yes Ladell Pier, MD  fluticasone (FLONASE) 50 MCG/ACT nasal spray Place 1 spray into both nostrils daily. 08/23/19  Yes Mercy Riding, MD  HYDROcodone-acetaminophen (NORCO/VICODIN) 5-325 MG tablet Take 1 tablet by mouth every 4 (four) hours as needed for moderate pain. 11/06/2019  Yes Ladell Pier, MD  lidocaine-prilocaine (EMLA) cream Apply 1 application topically as directed. 08/30/19  Yes Ladell Pier, MD  loperamide (IMODIUM) 2 MG capsule Take 1 capsule (2 mg total) by mouth as needed for diarrhea or loose stools (after each loose stool).  10/28/19  Yes Kayleen Memos, DO  loperamide (IMODIUM) 2 MG capsule Take 2 mg by mouth 2 (two) times daily.   Yes [provider]  loratadine (CLARITIN) 10 MG tablet Take 1 tablet (10 mg total) by mouth daily. 08/23/19 11/21/19 Yes Mercy Riding, MD  magic mouthwash SOLN Take 5 mLs by mouth 4 (four) times daily as needed for mouth pain (swish and spit). 08/30/19  Yes Ladell Pier, MD  metoprolol succinate (TOPROL-XL) 25 MG 24 hr tablet TAKE 1 TABLET BY MOUTH EVERY DAY. PLEASE MAKE AN APPOINTMENT WITH DOCTOR TURNER FOR FURHTER REFILLS. Patient taking differently: Take 25 mg by mouth daily.  12/13/18  Yes Turner, Eber Hong, MD  ondansetron (ZOFRAN) 4 MG tablet Take 8 mg by mouth daily.   Yes [provider]  ondansetron (ZOFRAN) 8 MG tablet Take 1 tablet (8 mg total) by mouth every 8 (eight) hours as needed for nausea or vomiting. 08/30/19  Yes Ladell Pier, MD  rivaroxaban (XARELTO) 20 MG TABS tablet Take 1 tablet (20 mg total) by mouth daily with supper. 10/18/19  Yes Ladell Pier, MD   Allergies  Allergen Reactions  . Compazine [Prochlorperazine Edisylate] Swelling  . Zyrtec [Cetirizine] Swelling    Swelling of nose    FAMILY HISTORY:  family history includes Congenital heart disease in her mother; Diabetes Mellitus II in her mother; Hypertension in her father and mother. SOCIAL HISTORY:  reports that she has quit smoking. She has never used smokeless tobacco. She reports current alcohol use. She reports that she does not use drugs.  REVIEW OF SYSTEMS: Positives in BOLD  Constitutional: fever, chills, weight loss, malaise/fatigue and diaphoresis.  HENT: Negative for hearing loss, ear pain, nosebleeds, congestion, sore throat, neck pain, tinnitus and ear discharge.   Eyes: Negative for blurred vision, double vision, photophobia, pain, discharge and redness.  Respiratory: cough, hemoptysis, sputum production, dyspnea with exertion, wheezing and stridor.    Cardiovascular: Negative for chest pain, palpitations, orthopnea, claudication, leg swelling and PND.  Gastrointestinal: Negative for heartburn, nausea, vomiting, abdominal pain, diarrhea, constipation, blood in stool and melena.  Genitourinary: Negative for dysuria, urgency, frequency, hematuria and flank pain.  Musculoskeletal: Negative for myalgias, back pain, joint pain and falls.  Skin: Negative for itching and rash.  Neurological: dizziness, tingling, tremors, sensory change, speech change, focal weakness, seizures, loss of consciousness, weakness and headaches.  Endo/Heme/Allergies: Negative for environmental allergies and polydipsia. Does not bruise/bleed easily.  SUBJECTIVE:  Pt states she is extremely weak   VITAL SIGNS: Temp:  [97.7 F (36.5 C)-99.7 F (37.6 C)] 98.8 F (37.1 C) (01/30 0500) Pulse Rate:  [72-98] 74 (01/30 0500) Resp:  [13-26] 17 (01/30 0500) BP: (72-139)/(33-93) 74/33 (01/30 0500) SpO2:  [95 %-100 %] 97 % (01/30 0500) Weight:  [137.3 kg] 137.3 kg (01/29 1928)  PHYSICAL EXAMINATION: General: chronically ill appearing female, does not appear in acute distress  Neuro: oriented but lethargic, follows commands, PERRL  HEENT: supple, no JVD, no mucositis  Cardiovascular: nsr, rrr, no R/G  Lungs: diminished throughout, even, non labored  Abdomen: hypoactive BS x4, significantly distended, taut, abdominal tenderness throughout Musculoskeletal: normal bulk and tone, no edema  Skin: buttocks excoriated   Recent Labs  Lab 10/21/2019 1944  NA 131*  K 4.0  CL 102  CO2 19*  BUN 34*  CREATININE  1.46*  GLUCOSE 138*   Recent Labs  Lab 11/03/2019 1944  HGB 6.2*  HCT 21.5*  WBC 0.5*  PLT 22*   DG Chest 1 View  Result Date: 10/17/2019 CLINICAL DATA:  Dyspnea EXAM: CHEST  1 VIEW COMPARISON:  None. FINDINGS: A right Port-A-Cath terminates in the right atrium, stable. No pneumothorax. Haziness over the right hemithorax seen on two views. There is significant  patient rotation. No focal infiltrate seen on the left. The cardiomediastinal silhouette is stable. IMPRESSION: The study is limited due to patient body habitus and significant patient rotation. Haziness over the right hemithorax could be due to patient rotation. A layering effusion or asymmetric edema could have a similar appearance. Recommend a better position chest x-ray for further evaluation. No other acute abnormalities. Electronically Signed   By: Dorise Bullion III M.D   On: 10/15/2019 21:15    ASSESSMENT / PLAN:  Dyspnea with exertion  Supplemental O2 for dyspnea and/or hypoxia  Prn bronchodilator therapy   Hypotension suspected secondary to hypovolemia in setting of acute GI bleed and poor po intake  Hx: Persistent atrial fibrillation, HTN, and Chronic diastolic CHF  Continuous telemetry monitoring  Blood products/fluid resuscitation and prn levophed gtt to maintain map >65 Will r/o sepsis  Hold outpatient antihypertensives for now   Acute on chronic renal failure  Hypomagnesia  Trend BMP  Replace electrolytes as indicated  Monitor UOP Avoid nephrotoxic medications   Pancytopenia secondary to acute GI bleed and chemotherapy  Trend CBC and WBC  Monitor for s/sx of bleeding Transfuse for hgb <7 and/or active signs of bleeding  Hold outpatient xarelto  Continue iv protonix  Gastroenterology consulted appreciate input  Cdiff pending  Follow cultures  Continue cefepime  Maintain neutropenic precautions   Colon cancer with mets to liver  Oncology consulted appreciate input  US Abdomen Complete pending due significant abdominal distension to assess for ascites  Palliative Care consulted to discuss goals of treatment  Prn morphine for pain management    Best Practice: VTE px: SCD's, avoid chemical prophylaxis  SUP px: iv protonix  Diet: keep NPO for now   Marda Stalker, [place] Pager [phone] (please enter 7 digits) PCCM Consult Pager  [phone] (please enter 7 digits)

## 2019-11-05 NOTE — Progress Notes (Signed)
PROGRESS NOTE    Amber Medina  [id number] DOB: 03/12/1951 DOA: 10/24/2019  PCP: [name], MD    LOS - 1   Brief Narrative:  Amber Medina is a 69 y.o. female with medical history significant for metastatic colon cancer undergoing chemotherapy, paroxysmal a-fib, history of PE/DVT in 2019, anemia, CKD stage 3, diastolic CHF, morbid obesity, HTN, who presented to the ED on 1/29 from [facility] due to abnormal labs.  Patient's chemo regimen had been changed recently, last treatment about 1-2 weeks before presenting.  Of note, she was admitted recently for near-syncope and general debility, discharge on 1/22 to SNF.  Since that time, she reports having very poor appetite, nausea, new tenderness/swelling of the upper abdomen, weakness, diarrhea for several days.  In the ED, tachypneic, hypotensive, afebrile.  CBC notable for WBC 0.5, Hbg 6.2, Plts 22.  CMP showed Na 131, CO2 19, BUN 34, Cr 1.46 (from 0.9 recently), albumin 1.5, elevated LFT's including Alkphos, AST (low), total protein and total bili.  Oncologist was consulted in the ED.  Patient admitted to ICU for further management.  Subjective 1/30: Patient seen this AM in ICU.  States feeling better than when she came in. Denies fever/chill, cough or SOB.  Reports some lower chest / upper abdominal discomfort.  During encounter patient became acutely nauseous and vomited what appeared and smelled like fecal matter.  She says abdomen has been distended for some time now, but she has been having diarrhea.    Assessment & Plan:   Principal Problem:   Pancytopenia ([place]) Active Problems:   Obesity, morbid ([place])   GI bleed   Personal history of DVT (deep vein thrombosis)   Ascending aortic aneurysm (HCC)   Pancytopenia due to chemotherapy [facility])   Metastatic malignant neoplasm (HCC)  Pancytopenia - present on admission, most likely secondary to chemo.  With heme-positive stool.  Upon admission received 2 units pRBC's, 1 unit platelets  and 1 unit FFP. --Oncology following, recommends transfusions of blood products as follows: "Maintain hemoglobin greater than 7.0.  If patient is actively bleeding, try to maintain platelet count greater than 50,000.  If GI bleed subsides, maintain platelet count greater than 20,000."  --Xarelto stopped --monitor CBC, coags closely  --SCD's for VTE ppx --neutropenic precautions --Cefepime --follow up cultures  Hypovolemic Shock - present on admission.  Improved.  Likely due to diarrhea and poor PO intake for several days. --PCCM following --pressors as needed to maintain MAP>65 --follow cultures and monitor for s/sx's of infection --continue Cefepime for now in case sepsis contributing  Stage IV Colon Cancer - rapidly progressive, on 2nd line chemotherapy, last tx 8 days prior, without Neulasta given aterward.  Follows with oncology at [facility].  Next chemo scheduled 2/4. --oncology following --pain control  SBO vs Ileus - tensely distended abdomen and vomited apparent fecal matter. General surgery consulted.  Recommend conservative management for now. NG tube placed for decompression.  Monitor closely.  GI Bleeding - heme-positive stools and worsened anemia (with pancytopenia/chemo also contributory). GI consulted but evaluation on hold due to above acute issues. --monitor CBC closely  --continue IV Protonix BID --SCD's  Acute Kidney Injury - Cr 1.46 on admission, from 0.9 recently.  Likely pre-renal due to diarrhea and poor PO intake.  Improved with IV hydration. --monitor BMP --avoid nephrotoxic agents  Paroxysmal A-fib - rate controlled --Xarelto held due to above --Diltiazem held due to hypotension/shock  History of PE/DVT - Xarelto on hold  Diarrhea -  likely related to chemo. --C diff pending - was recently hospitalized and got antibiotics --hold loperamide  Stage 2 Sacral Decubitus Ulcer - present on admission --WOC consulted  Chronic Diastolic CHF - currently  appears compensated --monitor volume status   DVT prophylaxis: SCD's   Code Status: Partial Code   - DNI (NO MV support, NO Hemodialysis, and One round of CPR) Family Communication: none at bedside during encounter.   See Dr. Zoila Shutter note of this afternoon regarding family update and code status discussion.    Disposition Plan:  To be determined.  Pending further improvement.  Poor prognosis.   Consultants:   Oncology  General Surgery  GI  PCCM  Palliative Care  WOC  Procedures:   NG tube placement  Antimicrobials:   Cefepime 1/30 >> TBD    Objective: Vitals:   11/05/19 1430 11/05/19 1600 11/05/19 1651 11/05/19 1700  BP: (!) 107/51 108/69 (!) 123/53 110/67  Pulse: 81 79 80 81  Resp: 14 17 19 18   Temp: 97.9 F (36.6 C)  98.9 F (37.2 C)   TempSrc: Axillary  Axillary   SpO2: 94% 93% 94% 93%  Weight:        Intake/Output Summary (Last 24 hours) at 11/05/2019 1755 Last data filed at 11/05/2019 1711 Gross per 24 hour  Intake 3799.51 ml  Output 750 ml  Net 3049.51 ml   Filed Weights   11/05/2019 1928  Weight: (!) 137.3 kg    Examination:  General exam: awake, alert, no acute distress, morbidly obese HEENT: clear conjunctiva, anicteric sclera, pale mucus membranes and conjunctiva, hearing grossly normal  Respiratory system: clear to auscultation anteriorly, exam limited by body habitus, no wheezes, rales or rhonchi, normal respiratory effort. Cardiovascular system: normal S1/S2, RRR, b/l LE edema.   Gastrointestinal system: tensely distended, severe hepatomegaly (crosses midline), vomit appears fecal matter, non-tender except epigastric area Central nervous system: alert and oriented x4. no gross focal neurologic deficits, normal speech Extremities: moves all, normal tone Skin: dry, intact, normal temperature, pale Psychiatry: anxious mood, congruent affect, judgement and insight appear normal    Data Reviewed: I have personally reviewed following labs  and imaging studies  CBC: Recent Labs  Lab 10/25/2019 1944 11/05/19 0518 11/05/19 1147  WBC 0.5* 0.6* 0.5*  NEUTROABS 0.2* 0.1* 0.1*  HGB 6.2* 6.8* 7.6*  HCT 21.5* 22.2* 24.7*  MCV 96.4 94.9 92.5  PLT 22* 37* 45*   Basic Metabolic Panel: Recent Labs  Lab 10/25/2019 1944 11/05/19 0518  NA 131* 133*  K 4.0 4.0  CL 102 105  CO2 19* 19*  GLUCOSE 138* 149*  BUN 34* 39*  CREATININE 1.46* 1.58*  CALCIUM 8.0* 7.6*  MG  --  1.7   GFR: Estimated Creatinine Clearance: 46.7 mL/min (A) (by C-G formula based on SCr of 1.58 mg/dL (H)). Liver Function Tests: Recent Labs  Lab 10/09/2019 1944 11/05/19 0518  AST 12* 11*  ALT 11 9  ALKPHOS 149* 118  BILITOT 2.5* 3.0*  PROT 6.1* 5.5*  ALBUMIN 1.5* 1.6*   No results for input(s): LIPASE, AMYLASE in the last 168 hours. No results for input(s): AMMONIA in the last 168 hours. Coagulation Profile: Recent Labs  Lab 10/16/2019 1944 11/05/19 0518  INR 1.9* 2.8*   Cardiac Enzymes: No results for input(s): CKTOTAL, CKMB, CKMBINDEX, TROPONINI in the last 168 hours. BNP (last 3 results) No results for input(s): PROBNP in the last 8760 hours. HbA1C: No results for input(s): HGBA1C in the last 72 hours. CBG: No results  for input(s): GLUCAP in the last 168 hours. Lipid Profile: No results for input(s): CHOL, HDL, LDLCALC, TRIG, CHOLHDL, LDLDIRECT in the last 72 hours. Thyroid Function Tests: No results for input(s): TSH, T4TOTAL, FREET4, T3FREE, THYROIDAB in the last 72 hours. Anemia Panel: No results for input(s): VITAMINB12, FOLATE, FERRITIN, TIBC, IRON, RETICCTPCT in the last 72 hours. Sepsis Labs: Recent Labs  Lab 11/05/19 0649 11/05/19 0650  PROCALCITON 6.31  --   LATICACIDVEN  --  1.4    Recent Results (from the past 240 hour(s))  SARS CORONAVIRUS 2 (TAT 6-24 HRS) Nasopharyngeal Nasopharyngeal Swab     Status: None   Collection Time: 10/27/19  1:56 PM   Specimen: Nasopharyngeal Swab  Result Value Ref Range Status   SARS  Coronavirus 2 NEGATIVE NEGATIVE Final    Comment: (NOTE) SARS-CoV-2 target nucleic acids are NOT DETECTED. The SARS-CoV-2 RNA is generally detectable in upper and lower respiratory specimens during the acute phase of infection. Negative results do not preclude SARS-CoV-2 infection, do not rule out co-infections with other pathogens, and should not be used as the sole basis for treatment or other patient management decisions. Negative results must be combined with clinical observations, patient history, and epidemiological information. The expected result is Negative. Fact Sheet for Patients: SugarRoll.be Fact Sheet for Healthcare Providers: https://www.woods-mathews.com/ This test is not yet approved or cleared by the [place] FDA and  has been authorized for detection and/or diagnosis of SARS-CoV-2 by FDA under an Emergency Use Authorization (EUA). This EUA will remain  in effect (meaning this test can be used) for the duration of the COVID-19 declaration under Section 56 4(b)(1) of the Act, 21 U.S.C. section 360bbb-3(b)(1), unless the authorization is terminated or revoked sooner. Performed at [facility], [place] [address], [place], [place] [address]   Respiratory Panel by RT PCR (Flu A&B, Covid) - Nasopharyngeal Swab     Status: None   Collection Time: 11/05/2019 10:12 PM   Specimen: Nasopharyngeal Swab  Result Value Ref Range Status   SARS Coronavirus 2 by RT PCR NEGATIVE NEGATIVE Final    Comment: (NOTE) SARS-CoV-2 target nucleic acids are NOT DETECTED. The SARS-CoV-2 RNA is generally detectable in upper respiratoy specimens during the acute phase of infection. The lowest concentration of SARS-CoV-2 viral copies this assay can detect is 131 copies/mL. A negative result does not preclude SARS-Cov-2 infection and should not be used as the sole basis for treatment or other patient management decisions. A negative result may occur  with  improper specimen collection/handling, submission of specimen other than nasopharyngeal swab, presence of viral mutation(s) within the areas targeted by this assay, and inadequate number of viral copies (<131 copies/mL). A negative result must be combined with clinical observations, patient history, and epidemiological information. The expected result is Negative. Fact Sheet for Patients:  PinkCheek.be Fact Sheet for Healthcare Providers:  GravelBags.it This test is not yet ap proved or cleared by the [place] FDA and  has been authorized for detection and/or diagnosis of SARS-CoV-2 by FDA under an Emergency Use Authorization (EUA). This EUA will remain  in effect (meaning this test can be used) for the duration of the COVID-19 declaration under Section 564(b)(1) of the Act, 21 U.S.C. section 360bbb-3(b)(1), unless the authorization is terminated or revoked sooner.    Influenza A by PCR NEGATIVE NEGATIVE Final   Influenza B by PCR NEGATIVE NEGATIVE Final    Comment: (NOTE) The Xpert Xpress SARS-CoV-2/FLU/RSV assay is intended as an aid in  the  diagnosis of influenza from Nasopharyngeal swab specimens and  should not be used as a sole basis for treatment. Nasal washings and  aspirates are unacceptable for Xpert Xpress SARS-CoV-2/FLU/RSV  testing. Fact Sheet for Patients: PinkCheek.be Fact Sheet for Healthcare Providers: GravelBags.it This test is not yet approved or cleared by the [place] FDA and  has been authorized for detection and/or diagnosis of SARS-CoV-2 by  FDA under an Emergency Use Authorization (EUA). This EUA will remain  in effect (meaning this test can be used) for the duration of the  Covid-19 declaration under Section 564(b)(1) of the Act, 21  U.S.C. section 360bbb-3(b)(1), unless the authorization is  terminated or revoked. Performed  at [facility], [address] [place], [place] [address]   MRSA PCR Screening     Status: None   Collection Time: 11/05/19 12:05 AM   Specimen: Nasopharyngeal  Result Value Ref Range Status   MRSA by PCR NEGATIVE NEGATIVE Final    Comment:        The GeneXpert MRSA Assay (FDA approved for NASAL specimens only), is one component of a comprehensive MRSA colonization surveillance program. It is not intended to diagnose MRSA infection nor to guide or monitor treatment for MRSA infections. Performed at [facility], [address] [place], [place] [address]          Radiology Studies: DG Chest 1 View  Result Date: 10/15/2019 CLINICAL DATA:  Dyspnea EXAM: CHEST  1 VIEW COMPARISON:  None. FINDINGS: A right Port-A-Cath terminates in the right atrium, stable. No pneumothorax. Haziness over the right hemithorax seen on two views. There is significant patient rotation. No focal infiltrate seen on the left. The cardiomediastinal silhouette is stable. IMPRESSION: The study is limited due to patient body habitus and significant patient rotation. Haziness over the right hemithorax could be due to patient rotation. A layering effusion or asymmetric edema could have a similar appearance. Recommend a better position chest x-ray for further evaluation. No other acute abnormalities. Electronically Signed   By: Dorise Bullion III M.D   On: 10/16/2019 21:15   US Abdomen Complete  Result Date: 11/05/2019 CLINICAL DATA:  Abdominal distension. History of metastatic colon cancer. EXAM: ABDOMEN ULTRASOUND COMPLETE COMPARISON:  CT 10/21/2019 FINDINGS: Exam limited due to patient's inability to move, bowel gas and body habitus. Gallbladder: Evidence of gallbladder sludge without discrete stones. Gallbladder wall edema measuring 7-8 mm. Small amount of pericholecystic fluid. Negative sonographic Murphy sign. Common bile duct: Diameter: 5.9 mm. Liver: Moderate diffuse heterogeneity  compatible recent CT findings of worsening diffuse metastatic disease. Portal vein is patent on color Doppler imaging with normal direction of blood flow towards the liver. IVC: No abnormality visualized. Pancreas: Visualized portion unremarkable. Spleen: Size and appearance within normal limits. Right Kidney: Length: 11.5 cm. Echogenicity within normal limits. No mass or hydronephrosis visualized. Left Kidney: Length: Not visualized. Abdominal aorta: No aneurysm visualized. Other findings: None. IMPRESSION: 1. Moderate diffuse heterogeneity of the liver compatible with recent CT findings of progression of metastatic disease of patient's known colon carcinoma. 2. Moderate gallbladder sludge with nonspecific gallbladder wall edema measuring 7-8 mm. Recommend clinical correlation for acute cholecystitis. Electronically Signed   By: Marin Olp M.D.   On: 11/05/2019 09:54   DG Chest Port 1 View  Result Date: 11/05/2019 CLINICAL DATA:  Shortness of breath. EXAM: PORTABLE CHEST 1 VIEW COMPARISON:  Chest radiograph 10/16/2019 FINDINGS: Right anterior chest wall Port-A-Cath is present with tip projecting over the right atrium. Stable cardiomegaly.  Aortic atherosclerosis. Improved aeration of the right lung with persistent hazy opacification. No definite pleural effusion or pneumothorax. Thoracic spine degenerative changes. IMPRESSION: Improved hazy opacities over the right lung may be secondary to overlying soft tissues. Underlying effusion or pulmonary opacities are not excluded. Electronically Signed   By: Lovey Newcomer M.D.   On: 11/05/2019 06:34        Scheduled Meds: . sodium chloride   Intravenous Once  . sodium chloride   Intravenous Once  . amiodarone  100 mg Oral QHS  . Chlorhexidine Gluconate Cloth  6 each Topical Daily  . pantoprazole (PROTONIX) IV  40 mg Intravenous Q12H   Continuous Infusions: . sodium chloride    . sodium chloride    . sodium chloride    . ceFEPime (MAXIPIME) IV 2 g  (11/05/19 1325)  . lactated ringers 100 mL/hr at 11/05/19 1618  . norepinephrine (LEVOPHED) Adult infusion 10 mcg/min (11/05/19 [id number])     LOS: 1 day    Time spent: 50-55 minutes    Ezekiel Slocumb, DO [facility]   If 7PM-7AM, please contact night-coverage [email or web address] 11/05/2019, 5:55 PM

## 2019-11-05 NOTE — Consult Note (Signed)
[facility]  [phone] [phone]  ID: Amber Medina OB: May 30, 1951  MR#: [id number]  [id number]  Patient Care Team: [name], MD as PCP - General (Cardiology)  CHIEF COMPLAINT: Progressive stage IV colon cancer, pancytopenia, possible obstruction.  INTERVAL HISTORY: Patient is a 69 year old female who was recently initiated second line chemotherapy for progressive stage IV colon cancer.  Her first dose of FOLFIRI was approximately 8 days ago.  She was sent to the emergency room from her assisted living facility with pancytopenia.  She feels improved from admission, but not back to baseline.  She has no neurologic complaints.  She denies any fevers.  She has a poor appetite, but does not report weight loss.  She has no chest pain, shortness of breath, cough, or hemoptysis.  She has increased nausea and vomiting, but denies constipation or diarrhea.  She has abdominal tenderness.  She has no urinary complaints.  Patient generally feels terrible, but offers no further specific complaints today.  REVIEW OF SYSTEMS:   Review of Systems  Constitutional: Positive for malaise/fatigue. Negative for fever and weight loss.  Respiratory: Negative.  Negative for cough and shortness of breath.   Cardiovascular: Negative.  Negative for chest pain and leg swelling.  Gastrointestinal: Positive for abdominal pain, blood in stool, nausea and vomiting.  Genitourinary: Negative.  Negative for dysuria.  Musculoskeletal: Negative.  Negative for back pain.  Skin: Negative.  Negative for rash.  Neurological: Positive for seizures. Negative for dizziness, focal weakness and headaches.  Psychiatric/Behavioral: Negative.  The patient is not nervous/anxious.     As per HPI. Otherwise, a complete review of systems is negative.  PAST MEDICAL HISTORY: Past Medical History:  Diagnosis Date  . Abnormal pulmonary function test 02/2017  . Chronic diastolic (congestive)  heart failure ([place]) 10/11/2015  . CKD (chronic kidney disease), stage II   . Colon cancer ([place])   . Hypertension   . Microcytic anemia    by labs 02/2017 -> adm 06/2017 with severe anemia, found to have colon CA.  . Morbid obesity ([place])   . Nocturnal hypoxemia   . Persistent atrial fibrillation ([place])   . Snoring   . Suspected sleep apnea     PAST SURGICAL HISTORY: Past Surgical History:  Procedure Laterality Date  . COLONOSCOPY WITH PROPOFOL N/A 06/13/2017   Procedure: COLONOSCOPY WITH PROPOFOL;  Surgeon: Carol Ada, MD;  Location: [facility];  Service: Endoscopy;  Laterality: N/A;  . ESOPHAGOGASTRODUODENOSCOPY (EGD) WITH PROPOFOL N/A 06/13/2017   Procedure: ESOPHAGOGASTRODUODENOSCOPY (EGD) WITH PROPOFOL;  Surgeon: Carol Ada, MD;  Location: [facility];  Service: Endoscopy;  Laterality: N/A;  . IR IMAGING GUIDED PORT INSERTION  08/16/2019  . TONSILLECTOMY      FAMILY HISTORY: Family History  Problem Relation Age of Onset  . Diabetes Mellitus II Mother   . Hypertension Mother   . Congenital heart disease Mother   . Hypertension Father     ADVANCED DIRECTIVES (Y/N):  @ADVDIR @  HEALTH MAINTENANCE: Social History   Tobacco Use  . Smoking status: Former Research scientist (life sciences)  . Smokeless tobacco: Never Used  . Tobacco comment: quit in 2013  Substance Use Topics  . Alcohol use: Yes    Comment: occ  . Drug use: No     Colonoscopy:  PAP:  Bone density:  Lipid panel:  Allergies  Allergen Reactions  . Compazine [Prochlorperazine Edisylate] Swelling  . Zyrtec [Cetirizine] Swelling    Swelling of nose    Current Facility-Administered Medications  Medication  Dose Route Frequency Provider Last Rate Last Admin  . 0.9 %  sodium chloride infusion (Manually program via Guardrails IV Fluids)   Intravenous Once Awilda Bill, NP      . 0.9 %  sodium chloride infusion (Manually program via Guardrails IV Fluids)   Intravenous Once Awilda Bill, NP      . 0.9 %  sodium chloride  infusion  10 mL/hr Intravenous Once Wouk, Ailene Rud, MD      . 0.9 %  sodium chloride infusion  10 mL/hr Intravenous Once Wouk, Ailene Rud, MD      . 0.9 %  sodium chloride infusion  250 mL Intravenous Continuous Awilda Bill, NP      . amiodarone (PACERONE) tablet 100 mg  100 mg Oral QHS Gwynne Edinger, MD   Stopped at 11/05/19 [phone]  . ceFEPIme (MAXIPIME) 2 g in sodium chloride 0.9 % 100 mL IVPB  2 g Intravenous Q12H Wouk, Ailene Rud, MD 200 mL/hr at 11/05/19 1325 2 g at 11/05/19 1325  . Chlorhexidine Gluconate Cloth 2 % PADS 6 each  6 each Topical Daily Kasa, Kurian, MD      . lactated ringers infusion   Intravenous Continuous Awilda Bill, NP 100 mL/hr at 11/05/19 0635 Rate Change at 11/05/19 0635  . morphine 2 MG/ML injection 1-2 mg  1-2 mg Intravenous Q4H PRN Awilda Bill, NP   1 mg at 11/05/19 0958  . norepinephrine (LEVOPHED) 16 mg in 231mL premix infusion  0-40 mcg/min Intravenous Titrated Awilda Bill, NP 9.38 mL/hr at 11/05/19 0652 10 mcg/min at 11/05/19 [id number]  . ondansetron (ZOFRAN) injection 4 mg  4 mg Intravenous Q6H PRN Gwynne Edinger, MD   4 mg at 11/05/19 0932  . pantoprazole (PROTONIX) injection 40 mg  40 mg Intravenous Q12H Gwynne Edinger, MD   40 mg at 11/05/19 0932    OBJECTIVE: Vitals:   11/05/19 1308 11/05/19 1412  BP: (!) 104/49 (!) 97/59  Pulse: 80 81  Resp: 16 19  Temp: 98.8 F (37.1 C) 98.2 F (36.8 C)  SpO2: 93% 94%     Body mass index is 48.86 kg/m.    ECOG FS:4 - Bedbound  General: Ill-appearing, no acute distress. Eyes: Pink conjunctiva, anicteric sclera. HEENT: Normocephalic, moist mucous membranes. Lungs: No audible wheezing or coughing. Heart: Regular rate and rhythm. Abdomen: Soft, moderate tenderness to palpation, no obvious distention. Musculoskeletal: No edema, cyanosis, or clubbing. Neuro: Alert, answering all questions appropriately. Cranial nerves grossly intact. Skin: No rashes or petechiae noted. Psych:  Normal affect.    LAB RESULTS:  Lab Results  Component Value Date   NA 133 (L) 11/05/2019   K 4.0 11/05/2019   CL 105 11/05/2019   CO2 19 (L) 11/05/2019   GLUCOSE 149 (H) 11/05/2019   BUN 39 (H) 11/05/2019   CREATININE 1.58 (H) 11/05/2019   CALCIUM 7.6 (L) 11/05/2019   PROT 5.5 (L) 11/05/2019   ALBUMIN 1.6 (L) 11/05/2019   AST 11 (L) 11/05/2019   ALT 9 11/05/2019   ALKPHOS 118 11/05/2019   BILITOT 3.0 (H) 11/05/2019   GFRNONAA 33 (L) 11/05/2019   GFRAA 38 (L) 11/05/2019    Lab Results  Component Value Date   WBC 0.5 (LL) 11/05/2019   NEUTROABS 0.1 (L) 11/05/2019   HGB 7.6 (L) 11/05/2019   HCT 24.7 (L) 11/05/2019   MCV 92.5 11/05/2019   PLT 45 (L) 11/05/2019     STUDIES: DG Chest 1 View  Result Date: 10/27/2019 CLINICAL DATA:  Dyspnea EXAM: CHEST  1 VIEW COMPARISON:  None. FINDINGS: A right Port-A-Cath terminates in the right atrium, stable. No pneumothorax. Haziness over the right hemithorax seen on two views. There is significant patient rotation. No focal infiltrate seen on the left. The cardiomediastinal silhouette is stable. IMPRESSION: The study is limited due to patient body habitus and significant patient rotation. Haziness over the right hemithorax could be due to patient rotation. A layering effusion or asymmetric edema could have a similar appearance. Recommend a better position chest x-ray for further evaluation. No other acute abnormalities. Electronically Signed   By: Dorise Bullion III M.D   On: 10/09/2019 21:15   CT Angio Chest PE W and/or Wo Contrast  Result Date: 10/20/2019 CLINICAL DATA:  Dyspnea on exertion EXAM: CT ANGIOGRAPHY CHEST WITH CONTRAST TECHNIQUE: Multidetector CT imaging of the chest was performed using the standard protocol during bolus administration of intravenous contrast. Multiplanar CT image reconstructions and MIPs were obtained to evaluate the vascular anatomy. CONTRAST:  37mL OMNIPAQUE IOHEXOL 350 MG/ML SOLN COMPARISON:  02/05/2018  and chest x-ray of the same date FINDINGS: Cardiovascular: Heart size is enlarged without pericardial effusion. Findings similar to the prior study. Aortic caliber 4 cm of the ascending thoracic aorta. No signs of pulmonary embolism. Mildly limited assessment of the lung bases due to respiratory motion. Mediastinum/Nodes: Right-sided central venous access device terminates in the right atrium. Pre pericardial lymph node measuring 1.5 cm new from previous exams. No additional signs of mediastinal adenopathy. Esophagus grossly normal. Mildly nodular thyroid similar to prior study. Lungs/Pleura: Small pulmonary nodules scattered throughout the right chest some within some without calcification. New pulmonary nodule right lung apex (image 20, series 6) 6 mm New right upper lobe pulmonary nodule (image 61, series 6) 6 mm New pulmonary nodule in the right lower lobe (image 84, series 6) 6 mm. Signs of basilar atelectasis on the left. No signs of pleural effusion Small left upper lobe pulmonary nodule, also appears new compared to the previous study. Upper Abdomen: Signs of hepatic metastatic disease not well evaluated. Suggestion of diffuse infiltration of the left hepatic lobe. Small amount perisplenic fluid has developed also since the study of 08/13/2019. Findings suggest interval worsening of disease in the liver though again this areas not well evaluated currently. Bulky celiac lymph node (image 88, series 4) 3.3 cm in short axis unchanged. Musculoskeletal: No destructive bone process. No acute bone finding. Review of the MIP images confirms the above findings. IMPRESSION: 1. Negative for pulmonary embolism. 2. Interval development of multiple bilateral pulmonary nodules, noncalcified findings are suspicious for metastatic disease. 3. Interval development of a 1.5 cm pre pericardial lymph node (image 134, series 5), suspicious for metastatic disease. 4. Signs of hepatic metastatic disease not well evaluated  currently, but appearing worse than on the prior study. 5. Small amount of perisplenic fluid has developed since the previous study. 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 Aortic Atherosclerosis (ICD10-I70.0). Electronically Signed   By: Zetta Bills M.D.   On: 10/20/2019 12:35   US Abdomen Complete  Result Date: 11/05/2019 CLINICAL DATA:  Abdominal distension. History of metastatic colon cancer. EXAM: ABDOMEN ULTRASOUND COMPLETE COMPARISON:  CT 10/21/2019 FINDINGS: Exam limited due to patient's inability to move, bowel gas and body habitus. Gallbladder: Evidence of gallbladder sludge without discrete stones. Gallbladder wall edema measuring 7-8 mm. Small amount of pericholecystic fluid. Negative sonographic Murphy sign. Common bile duct: Diameter:  5.9 mm. Liver: Moderate diffuse heterogeneity compatible recent CT findings of worsening diffuse metastatic disease. Portal vein is patent on color Doppler imaging with normal direction of blood flow towards the liver. IVC: No abnormality visualized. Pancreas: Visualized portion unremarkable. Spleen: Size and appearance within normal limits. Right Kidney: Length: 11.5 cm. Echogenicity within normal limits. No mass or hydronephrosis visualized. Left Kidney: Length: Not visualized. Abdominal aorta: No aneurysm visualized. Other findings: None. IMPRESSION: 1. Moderate diffuse heterogeneity of the liver compatible with recent CT findings of progression of metastatic disease of patient's known colon carcinoma. 2. Moderate gallbladder sludge with nonspecific gallbladder wall edema measuring 7-8 mm. Recommend clinical correlation for acute cholecystitis. Electronically Signed   By: Marin Olp M.D.   On: 11/05/2019 09:54   CT ABDOMEN PELVIS W CONTRAST  Result Date: 10/21/2019 CLINICAL DATA:  Metastatic colon cancer. Abdominal distension, weakness and diarrhea. EXAM: CT ABDOMEN AND PELVIS WITH CONTRAST  TECHNIQUE: Multidetector CT imaging of the abdomen and pelvis was performed using the standard protocol following bolus administration of intravenous contrast. CONTRAST:  125mL OMNIPAQUE IOHEXOL 300 MG/ML  SOLN COMPARISON:  Chest CT from yesterday and CT abdomen/pelvis 08/13/2019 FINDINGS: Lower chest: A few small pulmonary nodules are noted and consistent with metastatic disease as demonstrated on yesterday's chest CT. Streaky left basilar atelectasis. No effusions or infiltrates. The heart is normal in size. No pericardial effusion. Hepatobiliary: Severe and markedly progressive hepatic metastatic disease. There is now a large confluent mass lesion occupying the left hepatic lobe and measuring 14.5 x 12.0 cm on image 36/2. Numerous lesions are also noted throughout the right hepatic lobe and these have also enlarged. Exophytic tumor versus adjacent adenopathy near the left hepatic lobe on image number 28/2 measures 4 cm. This is unchanged in size. No biliary dilatation. Pancreas: No mass, inflammation or ductal dilatation. Spleen: Normal size.  No focal lesions. Adrenals/Urinary Tract: Stable bilateral adrenal gland nodules. These are likely benign adenomas. The kidneys are unremarkable. No renal lesions or hydronephrosis. The bladder is normal. Stomach/Bowel: The stomach, duodenum and small bowel are unremarkable. No mass lesions or obstructive findings. Stable surgical changes from a right hemicolectomy. Colonic diverticulosis but no findings for obstructing mass. Vascular/Lymphatic: Moderate scattered atherosclerotic calcifications involving the aorta and iliac arteries. No aneurysm or dissection. The major venous structures are patent. Enlarging omental and peritoneal surface lesions. The largest omental lesion measures 3.9 cm on image 53/2. This previously measured 3.1 cm. Other smaller lesions have enlarged slightly also. No retroperitoneal lymphadenopathy. Reproductive: The uterus and ovaries are  unremarkable. Other: No pelvic mass, pelvic adenopathy or free pelvic fluid collections. No inguinal mass or adenopathy. Musculoskeletal: No significant bony findings. IMPRESSION: 1. Significant progression of metastatic hepatic disease suggesting a very aggressive neoplasm. 2. Enlarging omental and peritoneal surface lesions. 3. Small pulmonary nodules consistent with pulmonary metastatic disease. Electronically Signed   By: Marijo Sanes M.D.   On: 10/21/2019 16:12   DG Chest Port 1 View  Result Date: 11/05/2019 CLINICAL DATA:  Shortness of breath. EXAM: PORTABLE CHEST 1 VIEW COMPARISON:  Chest radiograph 10/30/2019 FINDINGS: Right anterior chest wall Port-A-Cath is present with tip projecting over the right atrium. Stable cardiomegaly. Aortic atherosclerosis. Improved aeration of the right lung with persistent hazy opacification. No definite pleural effusion or pneumothorax. Thoracic spine degenerative changes. IMPRESSION: Improved hazy opacities over the right lung may be secondary to overlying soft tissues. Underlying effusion or pulmonary opacities are not excluded. Electronically Signed   By: Polly Cobia.D.  On: 11/05/2019 06:34   DG Chest Port 1 View  Result Date: 10/20/2019 CLINICAL DATA:  Dyspnea. EXAM: PORTABLE CHEST 1 VIEW COMPARISON:  08/13/2019 FINDINGS: The heart size and pulmonary vascularity are and the lungs clear. Aortic atherosclerosis. New power port in place. Tip is in superior vena cava just below the carina. No significant bone abnormality. IMPRESSION: 1. No active disease. 2.  Aortic Atherosclerosis (ICD10-I70.0). 3. Power port in good position. Electronically Signed   By: Lorriane Shire M.D.   On: 10/20/2019 10:26   ECHOCARDIOGRAM COMPLETE  Result Date: 10/21/2019   ECHOCARDIOGRAM REPORT   Patient Name:   DELEE Mireles  Date of Exam: 10/21/2019 Medical Rec #:  [id number]  Height:       66.0 in Accession #:    [id number] Weight:       270.0 lb Date of Birth:  [birth year]  BSA:           2.27 m Patient Age:    54 years   BP:           131/55 mmHg Patient Gender: F          HR:           108 bpm. Exam Location:  Inpatient Procedure: 2D Echo Indications:    Syncope 780.2 / R55  History:        Patient has prior history of Echocardiogram examinations, most                 recent 02/06/2018. CHF. Colon cancer. Chronic kidney disease.                 Anemia.  Sonographer:    Darlina Sicilian RDCS Referring Phys: [id number] [place]  1. Left ventricular ejection fraction, by visual estimation, is 60 to 65%. The left ventricle has normal function. There is moderately increased left ventricular hypertrophy.  2. Left ventricular diastolic parameters are consistent with Grade II diastolic dysfunction (pseudonormalization).  3. Elevated left atrial pressure.  4. Global right ventricle has normal systolic function.The right ventricular size is normal.  5. Left atrial size was normal.  6. Right atrial size was normal.  7. The mitral valve is normal in structure. No evidence of mitral valve regurgitation.  8. The tricuspid valve is normal in structure.  9. The aortic valve was not well visualized. Aortic valve regurgitation is mild. Mild to moderate aortic valve sclerosis/calcification without any evidence of aortic stenosis. 10. The pulmonic valve was not well visualized. Pulmonic valve regurgitation is not visualized. 11. The inferior vena cava is normal in size with <50% respiratory variability, suggesting right atrial pressure of 8 mmHg. 12. TR signal is inadequate for assessing pulmonary artery systolic pressure. 13. There is dilatation of the ascending aorta measuring 41 mm. FINDINGS  Left Ventricle: Left ventricular ejection fraction, by visual estimation, is 60 to 65%. The left ventricle has normal function. The left ventricle has no regional wall motion abnormalities. There is moderately increased left ventricular hypertrophy. Left ventricular diastolic parameters are consistent with Grade II  diastolic dysfunction (pseudonormalization). Elevated left atrial pressure. Right Ventricle: The right ventricular size is normal. No increase in right ventricular wall thickness. Global RV systolic function is has normal systolic function. Left Atrium: Left atrial size was normal in size. Right Atrium: Right atrial size was normal in size Pericardium: Trivial pericardial effusion is present. Mitral Valve: The mitral valve is normal in structure. No evidence of mitral valve regurgitation. Tricuspid Valve: The tricuspid valve is  normal in structure. Tricuspid valve regurgitation is not demonstrated. Aortic Valve: The aortic valve was not well visualized. Aortic valve regurgitation is mild. Aortic regurgitation PHT measures 465 msec. Mild to moderate aortic valve sclerosis/calcification is present, without any evidence of aortic stenosis. Pulmonic Valve: The pulmonic valve was not well visualized. Pulmonic valve regurgitation is not visualized. Pulmonic regurgitation is not visualized. Aorta: Aortic dilatation noted. There is dilatation of the ascending aorta measuring 41 mm. Venous: The inferior vena cava is normal in size with less than 50% respiratory variability, suggesting right atrial pressure of 8 mmHg. IAS/Shunts: The interatrial septum was not well visualized.  LEFT VENTRICLE PLAX 2D LVIDd:         3.70 cm  Diastology LVIDs:         2.30 cm  LV e' lateral:   5.66 cm/s LV PW:         1.30 cm  LV E/e' lateral: 17.2 LV IVS:        1.40 cm  LV e' medial:    5.77 cm/s LVOT diam:     2.00 cm  LV E/e' medial:  16.9 LV SV:         40 ml LV SV Index:   16.31 LVOT Area:     3.14 cm  RIGHT VENTRICLE RV S prime:     24.90 cm/s TAPSE (M-mode): 1.9 cm LEFT ATRIUM             Index       RIGHT ATRIUM           Index LA diam:        4.40 cm 1.94 cm/m  RA Area:     13.00 cm LA Vol (A2C):   58.9 ml 25.93 ml/m RA Volume:   29.50 ml  12.99 ml/m LA Vol (A4C):   64.8 ml 28.52 ml/m LA Biplane Vol: 68.0 ml 29.93 ml/m  AORTIC  VALVE LVOT Vmax:   108.00 cm/s LVOT Vmean:  73.600 cm/s LVOT VTI:    0.169 m AI PHT:      465 msec  AORTA Ao Root diam: 3.10 cm Ao Asc diam:  4.10 cm MITRAL VALVE MV Area (PHT): 4.36 cm              SHUNTS MV PHT:        50.46 msec            Systemic VTI:  0.17 m MV Decel Time: 174 msec              Systemic Diam: 2.00 cm MV E velocity: 97.40 cm/s  103 cm/s MV A velocity: 106.00 cm/s 70.3 cm/s MV E/A ratio:  0.92        1.5  Oswaldo Milian MD Electronically signed by Oswaldo Milian MD Signature Date/Time: 10/21/2019/7:54:01 PM    Final     ASSESSMENT: Progressive stage IV colon cancer, pancytopenia, possible obstruction.  PLAN:    1.  Progressive stage IV colon cancer: Patient recently noted to have rapidly progressive disease and was initiated on second line chemotherapy using FOLFIRI approximately 8 days ago.  Her primary oncologist is at [facility].  Patient reports she did not receive Neulasta.  This is likely the cause of her pancytopenia.  Her next scheduled chemotherapy is on February fourth 2021. 2.  Pancytopenia: Likely secondary to chemotherapy.  Although patient's severe anemia may also be secondary to GI bleed.  Plus or thrombocytopenia may be lower than expected secondary to underlying DIC.  Continue to support with transfusions.  Maintain hemoglobin greater than 7.0.  If patient is actively bleeding, try to maintain platelet count greater than 50,000.  If GI bleed subsides, maintain platelet count greater than 20,000. 3.  Thrombocytopenia: Partially secondary to chemotherapy as above.  Patient also has an elevated INR and prothrombin time as well as elevated fibrinogen and fibrin degradation products suggesting underlying DIC.  Continue to monitor daily platelet count.  Transfuse as above. 4.  Coagulopathy: Possibly related to DIC, monitor.  Xarelto has been discontinued. 5.  GI bleed/abdominal pain: Appreciate GI input.  Concern for possible obstruction.  NG tube has been  placed and awaiting surgical consultation.  Appreciate consult, will follow.   Lloyd Huger, MD   11/05/2019 2:19 PM

## 2019-11-05 NOTE — Progress Notes (Signed)
Pharmacy Antibiotic Note  Amber Medina is a 69 y.o. female admitted on 10/29/2019 with febrile neutropenia.  Pharmacy has been consulted for Cefepime dosing.  Plan: Cefepime 2 gm IV Q12H ordered to start on 1/29 @ 2230    Weight: (!) 302 lb 11.1 oz (137.3 kg)  Temp (24hrs), Avg:98.8 F (37.1 C), Min:97.5 F (36.4 C), Max:99.7 F (37.6 C)  Recent Labs  Lab 10/26/2019 1944 11/05/19 0518 11/05/19 0650  WBC 0.5* 0.6*  --   CREATININE 1.46* 1.58*  --   LATICACIDVEN  --   --  1.4    Estimated Creatinine Clearance: 46.7 mL/min (A) (by C-G formula based on SCr of 1.58 mg/dL (H)).    Allergies  Allergen Reactions  . Compazine [Prochlorperazine Edisylate] Swelling  . Zyrtec [Cetirizine] Swelling    Swelling of nose    Antimicrobials this admission:   >>    >>   Dose adjustments this admission:   Microbiology results:  BCx:   UCx:    Sputum:    MRSA PCR:   Thank you for allowing pharmacy to be a part of this patient's care.  Beatrice Ziehm A 11/05/2019 9:33 AM

## 2019-11-05 NOTE — Progress Notes (Signed)
Oncall provider Sharion Settler NP made aware 0040 of blood pressure 81/44 after 1st unit of PRBC  had been infusing for an hour and one unit of plasma. Provider gave instructions to continue with blood products and to monitor patient.  0215 patients BP 72/38 on call provider Sharion Settler NP made aware. Provider gave instructions to run Platelets and 2nd Unit of PRBC at same time. 0430 BP 88/44 Provider gave instructions to finish 2nd unit of PRBC then give a 582ml bolus of normal saline. 0530 Neo started Patients BP 91/55. Patient alert,oriented, and following commands. Will continue to monitor

## 2019-11-05 NOTE — Consult Note (Signed)
Subjective:   CC: emesis  HPI:  Amber Medina is a 69 y.o. female who was consulted by [facility] for issue above.  Hx of Stage IV colon CA, Admitted for pancytopenia, noted to have heme positive stools, resuscitated in the ICU when she reported to have stool like emesis.   Pt reports last BM yesterday, loose.  States she feels distended as well.   Past Medical History:  has a past medical history of Abnormal pulmonary function test (02/2017), Chronic diastolic (congestive) heart failure ([place]) (10/11/2015), CKD (chronic kidney disease), stage II, Colon cancer ([place]), Hypertension, Microcytic anemia, Morbid obesity ([place]), Nocturnal hypoxemia, Persistent atrial fibrillation ([place]), Snoring, and Suspected sleep apnea.  Past Surgical History:  Past Surgical History:  Procedure Laterality Date  . COLONOSCOPY WITH PROPOFOL N/A 06/13/2017   Procedure: COLONOSCOPY WITH PROPOFOL;  Surgeon: Carol Ada, MD;  Location: [facility];  Service: Endoscopy;  Laterality: N/A;  . ESOPHAGOGASTRODUODENOSCOPY (EGD) WITH PROPOFOL N/A 06/13/2017   Procedure: ESOPHAGOGASTRODUODENOSCOPY (EGD) WITH PROPOFOL;  Surgeon: Carol Ada, MD;  Location: [facility];  Service: Endoscopy;  Laterality: N/A;  . IR IMAGING GUIDED PORT INSERTION  08/16/2019  . TONSILLECTOMY      Family History: family history includes Congenital heart disease in her mother; Diabetes Mellitus II in her mother; Hypertension in her father and mother.  Social History:  reports that she has quit smoking. She has never used smokeless tobacco. She reports current alcohol use. She reports that she does not use drugs.  Current Medications:  Medications Prior to Admission  Medication Sig Dispense Refill  . amiodarone (PACERONE) 100 MG tablet Take 1 tablet (100 mg total) by mouth at bedtime. (Patient taking differently: Take 100 mg by mouth daily. ) 30 tablet 0  . diclofenac Sodium (VOLTAREN) 1 % GEL Apply 2 g topically 4 (four) times daily as needed (pain).     Marland Kitchen diltiazem (CARTIA XT) 240 MG 24 hr capsule Take 1 capsule (240 mg total) by mouth daily. Please make overdue annual appt for future refills. [phone]. 2nd attempt. (Patient taking differently: Take 240 mg by mouth daily. ) 15 capsule 0  . ferrous gluconate (FERGON) 324 MG tablet Take 1 tablet (324 mg total) by mouth 3 (three) times daily with meals. 60 tablet 3  . fluticasone (FLONASE) 50 MCG/ACT nasal spray Place 1 spray into both nostrils daily.  2  . HYDROcodone-acetaminophen (NORCO/VICODIN) 5-325 MG tablet Take 1 tablet by mouth every 4 (four) hours as needed for moderate pain. 60 tablet 0  . lidocaine-prilocaine (EMLA) cream Apply 1 application topically as directed. 30 g 0  . loperamide (IMODIUM) 2 MG capsule Take 1 capsule (2 mg total) by mouth as needed for diarrhea or loose stools (after each loose stool). 10 capsule 0  . loperamide (IMODIUM) 2 MG capsule Take 2 mg by mouth 2 (two) times daily.    Marland Kitchen loratadine (CLARITIN) 10 MG tablet Take 1 tablet (10 mg total) by mouth daily. 30 tablet 2  . magic mouthwash SOLN Take 5 mLs by mouth 4 (four) times daily as needed for mouth pain (swish and spit). 240 mL 0  . metoprolol succinate (TOPROL-XL) 25 MG 24 hr tablet TAKE 1 TABLET BY MOUTH EVERY DAY. PLEASE MAKE AN APPOINTMENT WITH DOCTOR TURNER FOR FURHTER REFILLS. (Patient taking differently: Take 25 mg by mouth daily. ) 30 tablet 0  . ondansetron (ZOFRAN) 4 MG tablet Take 8 mg by mouth daily.    . ondansetron (ZOFRAN) 8 MG tablet Take 1 tablet (8  mg total) by mouth every 8 (eight) hours as needed for nausea or vomiting. 20 tablet 0  . rivaroxaban (XARELTO) 20 MG TABS tablet Take 1 tablet (20 mg total) by mouth daily with supper. 30 tablet 1    Allergies:  Allergies as of 10/08/2019 - Review Complete 10/09/2019  Allergen Reaction Noted  . Compazine [prochlorperazine edisylate] Swelling 09/06/2015  . Zyrtec [cetirizine] Swelling 08/30/2019    ROS:  General: Denies weight loss, weight  gain, fatigue, fevers, chills, and night sweats. Eyes: Denies blurry vision, double vision, eye pain, itchy eyes, and tearing. Ears: Denies hearing loss, earache, and ringing in ears. Nose: Denies sinus pain, congestion, infections, runny nose, and nosebleeds. Mouth/throat: Denies hoarseness, sore throat, bleeding gums, and difficulty swallowing. Heart: Denies chest pain, palpitations, racing heart, irregular heartbeat, leg pain or swelling, and decreased activity tolerance. Respiratory: Denies breathing difficulty, shortness of breath, wheezing, cough, and sputum. GI: Denies change in appetite, heartburn, diarrhea, and blood in stool. GU: Denies difficulty urinating, pain with urinating, urgency, frequency, blood in urine. Musculoskeletal: Denies joint stiffness, pain, swelling, muscle weakness. Skin: Denies rash, itching, mass, tumors, sores, and boils Neurologic: Denies headache, fainting, dizziness, seizures, numbness, and tingling. Psychiatric: Denies depression, anxiety, difficulty sleeping, and memory loss. Endocrine: Denies heat or cold intolerance, and increased thirst or urination. Blood/lymph: Denies easy bruising, easy bruising, and swollen glands     Objective:     BP (!) 107/51   Pulse 81   Temp 97.9 F (36.6 C) (Axillary)   Resp 14   Wt (!) 137.3 kg   LMP  (LMP Unknown)   SpO2 94%   BMI 48.86 kg/m   Constitutional :  alert, cooperative, appears stated age and no distress  Lymphatics/Throat:  no asymmetry, masses, or scars  Respiratory:  clear to auscultation bilaterally  Cardiovascular:  regular rate and rhythm  Gastrointestinal: soft, obese, no guarding, some tympany noted, minimal tenderness.   Musculoskeletal: Steady movement  Skin: Cool and moist   Psychiatric: Normal affect, non-agitated, not confused       LABS:  CMP Latest Ref Rng & Units 11/05/2019 11/03/2019 10/27/2019  Glucose 70 - 99 mg/dL 149(H) 138(H) 201(H)  BUN 8 - 23 mg/dL 39(H) 34(H) 25(H)   Creatinine 0.44 - 1.00 mg/dL 1.58(H) 1.46(H) 0.91  Sodium 135 - 145 mmol/L 133(L) 131(L) 136  Potassium 3.5 - 5.1 mmol/L 4.0 4.0 3.8  Chloride 98 - 111 mmol/L 105 102 103  CO2 22 - 32 mmol/L 19(L) 19(L) 22  Calcium 8.9 - 10.3 mg/dL 7.6(L) 8.0(L) 8.3(L)  Total Protein 6.5 - 8.1 g/dL 5.5(L) 6.1(L) 6.2(L)  Total Bilirubin 0.3 - 1.2 mg/dL 3.0(H) 2.5(H) 1.1  Alkaline Phos 38 - 126 U/L 118 149(H) 278(H)  AST 15 - 41 U/L 11(L) 12(L) 20  ALT 0 - 44 U/L 9 11 13    CBC Latest Ref Rng & Units 11/05/2019 11/05/2019 10/13/2019  WBC 4.0 - 10.5 K/uL 0.5(LL) 0.6(LL) 0.5(LL)  Hemoglobin 12.0 - 15.0 g/dL 7.6(L) 6.8(L) 6.2(L)  Hematocrit 36.0 - 46.0 % 24.7(L) 22.2(L) 21.5(L)  Platelets 150 - 400 K/uL 45(L) 37(L) 22(LL)    RADS: CLINICAL DATA:  Abdominal distension. History of metastatic colon cancer.  EXAM: ABDOMEN ULTRASOUND COMPLETE  COMPARISON:  CT 10/21/2019  FINDINGS: Exam limited due to patient's inability to move, bowel gas and body habitus.  Gallbladder: Evidence of gallbladder sludge without discrete stones. Gallbladder wall edema measuring 7-8 mm. Small amount of pericholecystic fluid. Negative sonographic Murphy sign.  Common bile duct: Diameter:  5.9 mm.  Liver: Moderate diffuse heterogeneity compatible recent CT findings of worsening diffuse metastatic disease. Portal vein is patent on color Doppler imaging with normal direction of blood flow towards the liver.  IVC: No abnormality visualized.  Pancreas: Visualized portion unremarkable.  Spleen: Size and appearance within normal limits.  Right Kidney: Length: 11.5 cm. Echogenicity within normal limits. No mass or hydronephrosis visualized.  Left Kidney: Length: Not visualized.  Abdominal aorta: No aneurysm visualized.  Other findings: None.  IMPRESSION: 1. Moderate diffuse heterogeneity of the liver compatible with recent CT findings of progression of metastatic disease of patient's known colon  carcinoma.  2. Moderate gallbladder sludge with nonspecific gallbladder wall edema measuring 7-8 mm. Recommend clinical correlation for acute cholecystitis.   Electronically Signed   By: Marin Olp M.D.   On: 11/05/2019 09:54  Assessment:   Reported feculant emesis, pancytopenia, abdominal distention, heme positive stools  Plan:   NG placed at bedside with 579ml of initial bilious output.  Pt with hx of colon resection, emesis could be related to possible adhesion related SBO.  Due to pancytopenia, cannot undergo any surgical intevetion even if cause of emesis is obstructive in nature.  Recommend resuscitation and close monitoring in ICU while supportive care to reverse pancytopenia.  No rush for CT scan at this time since we cannot address possible causes at this time.  Conservative management with NG tube decompression for now.  Further workup for heme positive stool per GI.  No blood in NG ouput reassuring.

## 2019-11-05 NOTE — Progress Notes (Signed)
Family At bedside, clinical status relayed to patient/family  Updated and notified of patients medical condition-  Progressive metastatic colon cancer with very low chance of meaningful recovery.  Patient with Hemorrhagic shock and  SBO/Ileus Family understands the situation.  They have consented and agreed to DNI(NO  MV support, NO Hemodialysis, and One round of CPR)  Family are satisfied with Plan of action and management. All questions answered  Corrin Parker, M.D.  [facility]  Medical Director [place] Director [facility] Cardio-Pulmonary Department

## 2019-11-06 ENCOUNTER — Other Ambulatory Visit: Payer: Self-pay | Admitting: Oncology

## 2019-11-06 DIAGNOSIS — K56609 Unspecified intestinal obstruction, unspecified as to partial versus complete obstruction: Secondary | ICD-10-CM

## 2019-11-06 LAB — TYPE AND SCREEN
ABO/RH(D): O NEG
Antibody Screen: NEGATIVE
Unit division: 0
Unit division: 0
Unit division: 0
Unit division: 0

## 2019-11-06 LAB — BPAM RBC
Blood Product Expiration Date: 202102042359
Blood Product Expiration Date: 202102042359
Blood Product Expiration Date: 202102052359
Blood Product Expiration Date: 202102102359
ISSUE DATE / TIME: 202101292228
ISSUE DATE / TIME: 202101292320
ISSUE DATE / TIME: 202101300301
ISSUE DATE / TIME: 202101301407
Unit Type and Rh: 5100
Unit Type and Rh: 9500
Unit Type and Rh: 9500
Unit Type and Rh: 9500

## 2019-11-06 LAB — COMPREHENSIVE METABOLIC PANEL
ALT: 8 U/L (ref 0–44)
AST: 8 U/L — ABNORMAL LOW (ref 15–41)
Albumin: 1.5 g/dL — ABNORMAL LOW (ref 3.5–5.0)
Alkaline Phosphatase: 120 U/L (ref 38–126)
Anion gap: 12 (ref 5–15)
BUN: 37 mg/dL — ABNORMAL HIGH (ref 8–23)
CO2: 18 mmol/L — ABNORMAL LOW (ref 22–32)
Calcium: 7.8 mg/dL — ABNORMAL LOW (ref 8.9–10.3)
Chloride: 104 mmol/L (ref 98–111)
Creatinine, Ser: 1.4 mg/dL — ABNORMAL HIGH (ref 0.44–1.00)
GFR calc Af Amer: 44 mL/min — ABNORMAL LOW (ref >60)
GFR calc non Af Amer: 38 mL/min — ABNORMAL LOW (ref >60)
Glucose, Bld: 135 mg/dL — ABNORMAL HIGH (ref 70–99)
Potassium: 3.5 mmol/L (ref 3.5–5.1)
Sodium: 134 mmol/L — ABNORMAL LOW (ref 135–145)
Total Bilirubin: 5.2 mg/dL — ABNORMAL HIGH (ref 0.3–1.2)
Total Protein: 5.3 g/dL — ABNORMAL LOW (ref 6.5–8.1)

## 2019-11-06 LAB — BPAM PLATELET PHERESIS
Blood Product Expiration Date: 202101312359
Blood Product Expiration Date: 202101312359
Blood Product Expiration Date: 202101312359
ISSUE DATE / TIME: 202101300202
ISSUE DATE / TIME: 202101301135
ISSUE DATE / TIME: 202101301135
Unit Type and Rh: 5100
Unit Type and Rh: 5100
Unit Type and Rh: 5100

## 2019-11-06 LAB — CBC WITH DIFFERENTIAL/PLATELET
Abs Immature Granulocytes: 0.01 10*3/uL (ref 0.00–0.07)
Abs Immature Granulocytes: 0.01 10*3/uL (ref 0.00–0.07)
Abs Immature Granulocytes: 0.01 10*3/uL (ref 0.00–0.07)
Basophils Absolute: 0 10*3/uL (ref 0.0–0.1)
Basophils Absolute: 0 10*3/uL (ref 0.0–0.1)
Basophils Absolute: 0 10*3/uL (ref 0.0–0.1)
Basophils Relative: 0 %
Basophils Relative: 0 %
Basophils Relative: 0 %
Eosinophils Absolute: 0 10*3/uL (ref 0.0–0.5)
Eosinophils Absolute: 0 10*3/uL (ref 0.0–0.5)
Eosinophils Absolute: 0 10*3/uL (ref 0.0–0.5)
Eosinophils Relative: 0 %
Eosinophils Relative: 0 %
Eosinophils Relative: 2 %
HCT: 26 % — ABNORMAL LOW (ref 36.0–46.0)
HCT: 25.3 % — ABNORMAL LOW (ref 36.0–46.0)
HCT: 25.2 % — ABNORMAL LOW (ref 36.0–46.0)
Hemoglobin: 8.2 g/dL — ABNORMAL LOW (ref 12.0–15.0)
Hemoglobin: 8.1 g/dL — ABNORMAL LOW (ref 12.0–15.0)
Hemoglobin: 8 g/dL — ABNORMAL LOW (ref 12.0–15.0)
Immature Granulocytes: 2 %
Immature Granulocytes: 2 %
Immature Granulocytes: 2 %
Lymphocytes Relative: 80 %
Lymphocytes Relative: 77 %
Lymphocytes Relative: 72 %
Lymphs Abs: 0.4 10*3/uL — ABNORMAL LOW (ref 0.7–4.0)
Lymphs Abs: 0.3 10*3/uL — ABNORMAL LOW (ref 0.7–4.0)
Lymphs Abs: 0.3 10*3/uL — ABNORMAL LOW (ref 0.7–4.0)
MCH: 28.7 pg (ref 26.0–34.0)
MCH: 29.1 pg (ref 26.0–34.0)
MCH: 29.1 pg (ref 26.0–34.0)
MCHC: 31.5 g/dL (ref 30.0–36.0)
MCHC: 32 g/dL (ref 30.0–36.0)
MCHC: 31.7 g/dL (ref 30.0–36.0)
MCV: 90.9 fL (ref 80.0–100.0)
MCV: 91 fL (ref 80.0–100.0)
MCV: 91.6 fL (ref 80.0–100.0)
Monocytes Absolute: 0 10*3/uL — ABNORMAL LOW (ref 0.1–1.0)
Monocytes Absolute: 0.1 10*3/uL (ref 0.1–1.0)
Monocytes Absolute: 0.1 10*3/uL (ref 0.1–1.0)
Monocytes Relative: 9 %
Monocytes Relative: 14 %
Monocytes Relative: 15 %
Neutro Abs: 0 10*3/uL — ABNORMAL LOW (ref 1.7–7.7)
Neutro Abs: 0 10*3/uL — ABNORMAL LOW (ref 1.7–7.7)
Neutro Abs: 0 10*3/uL — ABNORMAL LOW (ref 1.7–7.7)
Neutrophils Relative %: 9 %
Neutrophils Relative %: 7 %
Neutrophils Relative %: 9 %
Platelets: 52 10*3/uL — ABNORMAL LOW (ref 150–400)
Platelets: 49 10*3/uL — ABNORMAL LOW (ref 150–400)
Platelets: 42 10*3/uL — ABNORMAL LOW (ref 150–400)
RBC: 2.86 MIL/uL — ABNORMAL LOW (ref 3.87–5.11)
RBC: 2.78 MIL/uL — ABNORMAL LOW (ref 3.87–5.11)
RBC: 2.75 MIL/uL — ABNORMAL LOW (ref 3.87–5.11)
RDW: 17.4 % — ABNORMAL HIGH (ref 11.5–15.5)
RDW: 17.7 % — ABNORMAL HIGH (ref 11.5–15.5)
RDW: 17.8 % — ABNORMAL HIGH (ref 11.5–15.5)
Smear Review: DECREASED
WBC: 0.5 10*3/uL — CL (ref 4.0–10.5)
WBC: 0.4 10*3/uL — CL (ref 4.0–10.5)
WBC: 0.5 10*3/uL — CL (ref 4.0–10.5)
nRBC: 0 % (ref 0.0–0.2)
nRBC: 0 % (ref 0.0–0.2)
nRBC: 0 % (ref 0.0–0.2)

## 2019-11-06 LAB — PROTIME-INR
INR: 1.6 — ABNORMAL HIGH (ref 0.8–1.2)
Prothrombin Time: 18.9 seconds — ABNORMAL HIGH (ref 11.4–15.2)

## 2019-11-06 LAB — PROCALCITONIN: Procalcitonin: 8.24 ng/mL

## 2019-11-06 LAB — PREPARE PLATELET PHERESIS
Unit division: 0
Unit division: 0
Unit division: 0

## 2019-11-06 LAB — FIBRIN DERIVATIVES D-DIMER (ARMC ONLY): Fibrin derivatives D-dimer (ARMC): 5442.03 ng/mL (FEU) — ABNORMAL HIGH (ref 0.00–499.00)

## 2019-11-06 LAB — MAGNESIUM: Magnesium: 1.5 mg/dL — ABNORMAL LOW (ref 1.7–2.4)

## 2019-11-06 LAB — APTT: aPTT: 42 seconds — ABNORMAL HIGH (ref 24–36)

## 2019-11-06 LAB — FIBRINOGEN: Fibrinogen: 702 mg/dL — ABNORMAL HIGH (ref 210–475)

## 2019-11-06 MED ORDER — MAGNESIUM SULFATE 4 GM/100ML IV SOLN
4.0000 g | Freq: Once | INTRAVENOUS | Status: AC
  Administered 2019-11-06: 4 g via INTRAVENOUS
  Filled 2019-11-06: qty 100

## 2019-11-06 MED ORDER — MORPHINE SULFATE (PF) 2 MG/ML IV SOLN
1.0000 mg | INTRAVENOUS | Status: DC | PRN
  Administered 2019-11-06 – 2019-11-07 (×2): 2 mg via INTRAVENOUS
  Filled 2019-11-06 (×3): qty 1

## 2019-11-06 MED ORDER — VASOPRESSIN 20 UNIT/ML IV SOLN
0.0300 [IU]/min | INTRAVENOUS | Status: DC
  Administered 2019-11-06 – 2019-11-07 (×2): 0.03 [IU]/min via INTRAVENOUS
  Filled 2019-11-06 (×2): qty 2

## 2019-11-06 MED ORDER — AMIODARONE HCL IN DEXTROSE 360-4.14 MG/200ML-% IV SOLN
60.0000 mg/h | INTRAVENOUS | Status: AC
  Administered 2019-11-06 (×2): 60 mg/h via INTRAVENOUS
  Filled 2019-11-06 (×2): qty 200

## 2019-11-06 MED ORDER — PHENYLEPHRINE CONCENTRATED 100MG/250ML (0.4 MG/ML) INFUSION SIMPLE
0.0000 ug/min | INTRAVENOUS | Status: DC
  Administered 2019-11-06 (×2): 300 ug/min via INTRAVENOUS
  Administered 2019-11-06: 50 ug/min via INTRAVENOUS
  Administered 2019-11-07 (×2): 300 ug/min via INTRAVENOUS
  Filled 2019-11-06 (×5): qty 250

## 2019-11-06 MED ORDER — LACTATED RINGERS IV BOLUS
1000.0000 mL | Freq: Once | INTRAVENOUS | Status: AC
  Administered 2019-11-06: 1000 mL via INTRAVENOUS

## 2019-11-06 MED ORDER — SODIUM CHLORIDE 0.9 % IV SOLN: 4.0000 g | Freq: Once | INTRAVENOUS | Status: DC

## 2019-11-06 MED ORDER — HYDROCORTISONE NA SUCCINATE PF 100 MG IJ SOLR
50.0000 mg | Freq: Four times a day (QID) | INTRAMUSCULAR | Status: DC
  Administered 2019-11-06 – 2019-11-07 (×5): 50 mg via INTRAVENOUS
  Filled 2019-11-06 (×4): qty 2

## 2019-11-06 MED ORDER — MORPHINE SULFATE (PF) 2 MG/ML IV SOLN
2.0000 mg | Freq: Once | INTRAVENOUS | Status: AC
  Administered 2019-11-06: 2 mg via INTRAVENOUS

## 2019-11-06 MED ORDER — AMIODARONE HCL IN DEXTROSE 360-4.14 MG/200ML-% IV SOLN
30.0000 mg/h | INTRAVENOUS | Status: DC
  Administered 2019-11-06 – 2019-11-07 (×3): 30 mg/h via INTRAVENOUS
  Filled 2019-11-06 (×2): qty 200

## 2019-11-06 MED ORDER — TBO-FILGRASTIM 300 MCG/0.5ML ~~LOC~~ SOSY
300.0000 ug | PREFILLED_SYRINGE | Freq: Every day | SUBCUTANEOUS | Status: DC
  Administered 2019-11-06: 300 ug via SUBCUTANEOUS
  Filled 2019-11-06 (×5): qty 0.5

## 2019-11-06 MED ORDER — GERHARDT'S BUTT CREAM
TOPICAL_CREAM | Freq: Three times a day (TID) | CUTANEOUS | Status: DC
  Administered 2019-11-06 (×2): 1 via TOPICAL
  Filled 2019-11-06: qty 1

## 2019-11-06 NOTE — Progress Notes (Signed)
Accepting finally of all her organs failing. Daughter from [place] visited all day. Son flying in from [place]. Medicated with Morphine several times today. NG to LWS with 800 ml of greenish fluid. Urine output low. Enjoyed her ice chips today.

## 2019-11-06 NOTE — Progress Notes (Signed)
CRITICAL CARE NOTE 69 yo female with metastatic colon cancer to the liver receiving palliative chemotherapy admitted with hypotension and pancytopenia secondary to acute GI bleed (on xarelto) and hypovolemia due to poor po intake requiring levophed gtt   SIGNIFICANT EVENTS/STUDIES:  01/29: Pt admitted to the stepdown unit with pancytopenia secondary to GI bleed and chemotherapy treatment  01/30: Pt transferred to ICU due to hypotension despite multiple blood products requiring levophed gtt      CC  follow up shock  SUBJECTIVE Patient remains critically ill Prognosis is guarded On multiple pressors NG to suction 1L out +SBO/ilieus   BP (!) 94/56   Pulse (!) 134   Temp 98.5 F (36.9 C) (Axillary)   Resp 15   Wt (!) 137.3 kg   LMP  (LMP Unknown)   SpO2 93%   BMI 48.86 kg/m    I/O last 3 completed shifts: In: [id number] [I.V.:1471.5; Blood:1123; IV Piggyback:2696.6] Out: [id number] [Urine:700; Emesis/NG output:2350] No intake/output data recorded.  SpO2: 93 % O2 Flow Rate (L/min): 2 L/min   REVIEW OF SYSTEMS  PATIENT IS limited  DUE TO SEVERE CRITICAL ILLNESS       PHYSICAL EXAMINATION:  GENERAL:critically ill appearing,  HEAD: Normocephalic, atraumatic.  EYES: Pupils equal, round, reactive to light.  No scleral icterus.  MOUTH: Moist mucosal membrane. NECK: Supple.  PULMONARY: +rhonchi, +wheezing CARDIOVASCULAR: S1 and S2. Regular rate and rhythm. No murmurs, rubs, or gallops.  GASTROINTESTINAL: +istended.  NEG bowel sounds.   MUSCULOSKELETAL: No swelling, clubbing, or edema.  NEUROLOGIC: lethargic but arousbale SKIN:intact,warm,dry  MEDICATIONS: I have reviewed all medications and confirmed regimen as documented   CULTURE RESULTS   Recent Results (from the past 240 hour(s))  SARS CORONAVIRUS 2 (TAT 6-24 HRS) Nasopharyngeal Nasopharyngeal Swab     Status: None   Collection Time: 10/27/19  1:56 PM   Specimen: Nasopharyngeal Swab  Result Value Ref Range Status    SARS Coronavirus 2 NEGATIVE NEGATIVE Final    Comment: (NOTE) SARS-CoV-2 target nucleic acids are NOT DETECTED. The SARS-CoV-2 RNA is generally detectable in upper and lower respiratory specimens during the acute phase of infection. Negative results do not preclude SARS-CoV-2 infection, do not rule out co-infections with other pathogens, and should not be used as the sole basis for treatment or other patient management decisions. Negative results must be combined with clinical observations, patient history, and epidemiological information. The expected result is Negative. Fact Sheet for Patients: SugarRoll.be Fact Sheet for Healthcare Providers: https://www.woods-mathews.com/ This test is not yet approved or cleared by the [place] FDA and  has been authorized for detection and/or diagnosis of SARS-CoV-2 by FDA under an Emergency Use Authorization (EUA). This EUA will remain  in effect (meaning this test can be used) for the duration of the COVID-19 declaration under Section 56 4(b)(1) of the Act, 21 U.S.C. section 360bbb-3(b)(1), unless the authorization is terminated or revoked sooner. Performed at [facility], [place] [address], [place], [place] [address]   Respiratory Panel by RT PCR (Flu A&B, Covid) - Nasopharyngeal Swab     Status: None   Collection Time: 10/13/2019 10:12 PM   Specimen: Nasopharyngeal Swab  Result Value Ref Range Status   SARS Coronavirus 2 by RT PCR NEGATIVE NEGATIVE Final    Comment: (NOTE) SARS-CoV-2 target nucleic acids are NOT DETECTED. The SARS-CoV-2 RNA is generally detectable in upper respiratoy specimens during the acute phase of infection. The lowest concentration of SARS-CoV-2 viral copies this assay can detect is 131 copies/mL.  A negative result does not preclude SARS-Cov-2 infection and should not be used as the sole basis for treatment or other patient management decisions. A negative result  may occur with  improper specimen collection/handling, submission of specimen other than nasopharyngeal swab, presence of viral mutation(s) within the areas targeted by this assay, and inadequate number of viral copies (<131 copies/mL). A negative result must be combined with clinical observations, patient history, and epidemiological information. The expected result is Negative. Fact Sheet for Patients:  PinkCheek.be Fact Sheet for Healthcare Providers:  GravelBags.it This test is not yet ap proved or cleared by the [place] FDA and  has been authorized for detection and/or diagnosis of SARS-CoV-2 by FDA under an Emergency Use Authorization (EUA). This EUA will remain  in effect (meaning this test can be used) for the duration of the COVID-19 declaration under Section 564(b)(1) of the Act, 21 U.S.C. section 360bbb-3(b)(1), unless the authorization is terminated or revoked sooner.    Influenza A by PCR NEGATIVE NEGATIVE Final   Influenza B by PCR NEGATIVE NEGATIVE Final    Comment: (NOTE) The Xpert Xpress SARS-CoV-2/FLU/RSV assay is intended as an aid in  the diagnosis of influenza from Nasopharyngeal swab specimens and  should not be used as a sole basis for treatment. Nasal washings and  aspirates are unacceptable for Xpert Xpress SARS-CoV-2/FLU/RSV  testing. Fact Sheet for Patients: PinkCheek.be Fact Sheet for Healthcare Providers: GravelBags.it This test is not yet approved or cleared by the [place] FDA and  has been authorized for detection and/or diagnosis of SARS-CoV-2 by  FDA under an Emergency Use Authorization (EUA). This EUA will remain  in effect (meaning this test can be used) for the duration of the  Covid-19 declaration under Section 564(b)(1) of the Act, 21  U.S.C. section 360bbb-3(b)(1), unless the authorization is  terminated or  revoked. Performed at [facility], [address] [place], [place] [address]   MRSA PCR Screening     Status: None   Collection Time: 11/05/19 12:05 AM   Specimen: Nasopharyngeal  Result Value Ref Range Status   MRSA by PCR NEGATIVE NEGATIVE Final    Comment:        The GeneXpert MRSA Assay (FDA approved for NASAL specimens only), is one component of a comprehensive MRSA colonization surveillance program. It is not intended to diagnose MRSA infection nor to guide or monitor treatment for MRSA infections. Performed at [facility], [address] [place], [place] [address]   CULTURE, BLOOD (ROUTINE X 2) w Reflex to ID Panel     Status: None (Preliminary result)   Collection Time: 11/05/19  6:45 AM   Specimen: BLOOD  Result Value Ref Range Status   Specimen Description BLOOD L FOREARM  Final   Special Requests   Final    BOTTLES DRAWN AEROBIC AND ANAEROBIC Blood Culture adequate volume   Culture   Final    NO GROWTH < 24 HOURS Performed at [facility], [address] [place], [place] [address]    Report Status PENDING  Incomplete  CULTURE, BLOOD (ROUTINE X 2) w Reflex to ID Panel     Status: None (Preliminary result)   Collection Time: 11/05/19  6:49 AM   Specimen: BLOOD  Result Value Ref Range Status   Specimen Description BLOOD RAC  Final   Special Requests   Final    BOTTLES DRAWN AEROBIC ONLY Blood Culture adequate volume   Culture   Final    NO GROWTH <  24 HOURS Performed at [facility], [address] [place], [place] [address]    Report Status PENDING  Incomplete  CULTURE, BLOOD (ROUTINE X 2) w Reflex to ID Panel     Status: None (Preliminary result)   Collection Time: 11/06/19  1:21 AM   Specimen: BLOOD  Result Value Ref Range Status   Specimen Description BLOOD RIGHT ANTECUBITAL INNER  Final   Special Requests   Final    BOTTLES DRAWN AEROBIC AND ANAEROBIC Blood Culture adequate volume   Culture   Final    NO  GROWTH < 12 HOURS Performed at [facility], [address] [place], [place] [address]    Report Status PENDING  Incomplete  CULTURE, BLOOD (ROUTINE X 2) w Reflex to ID Panel     Status: None (Preliminary result)   Collection Time: 11/06/19  1:34 AM   Specimen: BLOOD  Result Value Ref Range Status   Specimen Description BLOOD RIGHT ANTECUBITAL OUTER  Final   Special Requests   Final    BOTTLES DRAWN AEROBIC AND ANAEROBIC Blood Culture results may not be optimal due to an excessive volume of blood received in culture bottles   Culture   Final    NO GROWTH < 12 HOURS Performed at [facility], [address] [place], [place] [address]    Report Status PENDING  Incomplete      CBC    Component Value Date/Time   WBC 0.4 (LL) 11/06/2019 0410   RBC 2.78 (L) 11/06/2019 0410   HGB 8.1 (L) 11/06/2019 0410   HGB 8.6 (L) 09/27/2019 0815   HGB 11.0 (L) 06/25/2017 1147   HCT 25.3 (L) 11/06/2019 0410   HCT 35.0 06/25/2017 1147   PLT 49 (L) 11/06/2019 0410   PLT 246 09/27/2019 0815   PLT 359 06/25/2017 1147   MCV 91.0 11/06/2019 0410   MCV 78 (L) 06/25/2017 1147   MCH 29.1 11/06/2019 0410   MCHC 32.0 11/06/2019 0410   RDW 17.7 (H) 11/06/2019 0410   RDW 29.2 (H) 06/25/2017 1147   LYMPHSABS 0.3 (L) 11/06/2019 0410   LYMPHSABS 1.6 02/17/2017 1359   MONOABS 0.1 11/06/2019 0410   EOSABS 0.0 11/06/2019 0410   EOSABS 0.1 02/17/2017 1359   BASOSABS 0.0 11/06/2019 0410   BASOSABS 0.0 02/17/2017 1359   BMP Latest Ref Rng & Units 11/06/2019 11/05/2019 10/07/2019  Glucose 70 - 99 mg/dL 135(H) 149(H) 138(H)  BUN 8 - 23 mg/dL 37(H) 39(H) 34(H)  Creatinine 0.44 - 1.00 mg/dL 1.40(H) 1.58(H) 1.46(H)  BUN/Creat Ratio 12 - 28 - - -  Sodium 135 - 145 mmol/L 134(L) 133(L) 131(L)  Potassium 3.5 - 5.1 mmol/L 3.5 4.0 4.0  Chloride 98 - 111 mmol/L 104 105 102  CO2 22 - 32 mmol/L 18(L) 19(L) 19(L)  Calcium 8.9 - 10.3 mg/dL 7.8(L) 7.6(L) 8.0(L)         ASSESSMENT AND  PLAN SYNOPSIS  SEPTIC SHOCK/HYPOVOLUMIC SHOCK Hypotension suspected secondary to hypovolemia in setting of acute GI bleed and poor po intake, sepsis - Persistent atrial fibrillation, HTN, and Chronic diastolic heart failure   ACUTE KIDNEY INJURY/Renal Failure -follow chem 7 -follow UO -continue Foley Catheter-assess need -Avoid nephrotoxic agents -Recheck creatinine    NEUROLOGY Avoid sedatives   SHOCK-SEPSIS/HYPOVOLUMIC/CARDIOGENIC -use vasopressors to keep MAP>65 -follow ABG and LA -follow up cultures -emperic ABX -consider stress dose steroids -aggressive IV fluid resuscitation  CARDIAC ICU monitoring  ID -continue IV abx as prescibed -follow up cultures  GI  GI PROPHYLAXIS as indicated  NUTRITIONAL STATUS DIET-->NGT to suction Constipation protocol as indicated  ENDO - will use ICU hypoglycemic\Hyperglycemia protocol if indicated   ELECTROLYTES -follow labs as needed -replace as needed -pharmacy consultation and following   DVT/GI PRX ordered TRANSFUSIONS AS NEEDED MONITOR FSBS ASSESS the need for LABS as needed   Critical Care Time devoted to patient care services described in this note is 35 minutes.   Overall, patient is critically ill, prognosis is guarded.  Patient with Multiorgan failure and at high risk for cardiac arrest and death.   Patient is Limited CODE, DNI. Can do one round of CPR Prognosis is very poor   Corrin Parker, M.D.  [facility]  Medical Director [place] Director [place] Department

## 2019-11-06 NOTE — Progress Notes (Signed)
[place]  [phone] [phone]) [phone]  ID: Amber Medina OB: November 09, 1950  MR#: [id number]  [id number]  Patient Care Team: [name], MD as PCP - General (Cardiology)  CHIEF COMPLAINT: Progressive stage IV colon cancer, pancytopenia, possible obstruction.  INTERVAL HISTORY: Patient with unchanged abdominal tenderness.  NG tube now in place.  Persistent weakness and fatigue.  No further complaints.  Daughter at bedside.  REVIEW OF SYSTEMS:   Review of Systems  Constitutional: Positive for malaise/fatigue. Negative for fever and weight loss.  Respiratory: Negative.  Negative for cough, hemoptysis and shortness of breath.   Cardiovascular: Negative.  Negative for chest pain and leg swelling.  Gastrointestinal: Positive for abdominal pain, blood in stool, nausea and vomiting.  Genitourinary: Negative.  Negative for dysuria.  Musculoskeletal: Negative.   Skin: Negative.  Negative for rash.  Neurological: Positive for weakness. Negative for dizziness, focal weakness and headaches.  Psychiatric/Behavioral: Negative.  The patient is not nervous/anxious.     As per HPI. Otherwise, a complete review of systems is negative.  PAST MEDICAL HISTORY: Past Medical History:  Diagnosis Date  . Abnormal pulmonary function test 02/2017  . Chronic diastolic (congestive) heart failure ([place]) 10/11/2015  . CKD (chronic kidney disease), stage II   . Colon cancer ([place])   . Hypertension   . Microcytic anemia    by labs 02/2017 -> adm 06/2017 with severe anemia, found to have colon CA.  . Morbid obesity ([place])   . Nocturnal hypoxemia   . Persistent atrial fibrillation ([place])   . Snoring   . Suspected sleep apnea     PAST SURGICAL HISTORY: Past Surgical History:  Procedure Laterality Date  . COLONOSCOPY WITH PROPOFOL N/A 06/13/2017   Procedure: COLONOSCOPY WITH PROPOFOL;  Surgeon: Carol Ada, MD;  Location: [facility];  Service: Endoscopy;  Laterality: N/A;   . ESOPHAGOGASTRODUODENOSCOPY (EGD) WITH PROPOFOL N/A 06/13/2017   Procedure: ESOPHAGOGASTRODUODENOSCOPY (EGD) WITH PROPOFOL;  Surgeon: Carol Ada, MD;  Location: [facility];  Service: Endoscopy;  Laterality: N/A;  . IR IMAGING GUIDED PORT INSERTION  08/16/2019  . TONSILLECTOMY      FAMILY HISTORY: Family History  Problem Relation Age of Onset  . Diabetes Mellitus II Mother   . Hypertension Mother   . Congenital heart disease Mother   . Hypertension Father     ADVANCED DIRECTIVES (Y/N):  @ADVDIR @  HEALTH MAINTENANCE: Social History   Tobacco Use  . Smoking status: Former Research scientist (life sciences)  . Smokeless tobacco: Never Used  . Tobacco comment: quit in 2013  Substance Use Topics  . Alcohol use: Yes    Comment: occ  . Drug use: No     Colonoscopy:  PAP:  Bone density:  Lipid panel:  Allergies  Allergen Reactions  . Compazine [Prochlorperazine Edisylate] Swelling  . Zyrtec [Cetirizine] Swelling    Swelling of nose    Current Facility-Administered Medications  Medication Dose Route Frequency Provider Last Rate Last Admin  . 0.9 %  sodium chloride infusion (Manually program via Guardrails IV Fluids)   Intravenous Once Blakeney, Dana G, NP      . 0.9 %  sodium chloride infusion (Manually program via Guardrails IV Fluids)   Intravenous Once Awilda Bill, NP      . 0.9 %  sodium chloride infusion  10 mL/hr Intravenous Once Wouk, Ailene Rud, MD      . 0.9 %  sodium chloride infusion  10 mL/hr Intravenous Once Wouk, Ailene Rud, MD      .  0.9 %  sodium chloride infusion  250 mL Intravenous Continuous Awilda Bill, NP      . amiodarone (NEXTERONE PREMIX) 360-4.14 MG/200ML-% (1.8 mg/mL) IV infusion  30 mg/hr Intravenous Continuous Awilda Bill, NP 16.67 mL/hr at 11/06/19 1127 30 mg/hr at 11/06/19 1127  . ceFEPIme (MAXIPIME) 2 g in sodium chloride 0.9 % 100 mL IVPB  2 g Intravenous Q12H Wouk, Ailene Rud, MD 200 mL/hr at 11/06/19 0912 2 g at 11/06/19 0912  . Chlorhexidine  Gluconate Cloth 2 % PADS 6 each  6 each Topical Daily Kasa, Kurian, MD      . Gerhardt's butt cream   Topical TID Nicole Kindred A, DO      . hydrocortisone sodium succinate (SOLU-CORTEF) 100 MG injection 50 mg  50 mg Intravenous Q6H Flora Lipps, MD   50 mg at 11/06/19 1230  . lactated ringers infusion   Intravenous Continuous Awilda Bill, NP 100 mL/hr at 11/05/19 2100 New Bag at 11/05/19 2100  . morphine 2 MG/ML injection 1-2 mg  1-2 mg Intravenous Q4H PRN Awilda Bill, NP   2 mg at 11/06/19 1230  . norepinephrine (LEVOPHED) 16 mg in 234mL premix infusion  0-40 mcg/min Intravenous Titrated Awilda Bill, NP 20.6 mL/hr at 11/06/19 0600 22 mcg/min at 11/06/19 0600  . ondansetron (ZOFRAN) injection 4 mg  4 mg Intravenous Q6H PRN Gwynne Edinger, MD   4 mg at 11/05/19 0932  . pantoprazole (PROTONIX) injection 40 mg  40 mg Intravenous Q12H Wouk, Ailene Rud, MD   40 mg at 11/06/19 1021  . phenylephrine CONCENTRATED 100mg  in sodium chloride 0.9% 219mL (0.4mg /mL) infusion  0-400 mcg/min Intravenous Titrated Awilda Bill, NP 30 mL/hr at 11/06/19 1022 200 mcg/min at 11/06/19 1022    OBJECTIVE: Vitals:   11/06/19 1115 11/06/19 1130  BP: (!) 89/49 (!) 93/52  Pulse: 75 75  Resp: 14 15  Temp:    SpO2: 95% 93%     Body mass index is 48.86 kg/m.    ECOG FS:4 - Bedbound  General: Ill-appearing, no acute distress. Eyes: Pink conjunctiva, anicteric sclera. HEENT: Normocephalic, moist mucous membranes. Lungs: No audible wheezing or coughing. Heart: Regular rate and rhythm. Abdomen: Distended, mildly tender to palpation. Musculoskeletal: No edema, cyanosis, or clubbing. Neuro: Alert, answering all questions appropriately. Cranial nerves grossly intact. Skin: No rashes or petechiae noted. Psych: Normal affect.    LAB RESULTS:  Lab Results  Component Value Date   NA 134 (L) 11/06/2019   K 3.5 11/06/2019   CL 104 11/06/2019   CO2 18 (L) 11/06/2019   GLUCOSE 135 (H)  11/06/2019   BUN 37 (H) 11/06/2019   CREATININE 1.40 (H) 11/06/2019   CALCIUM 7.8 (L) 11/06/2019   PROT 5.3 (L) 11/06/2019   ALBUMIN 1.5 (L) 11/06/2019   AST 8 (L) 11/06/2019   ALT 8 11/06/2019   ALKPHOS 120 11/06/2019   BILITOT 5.2 (H) 11/06/2019   GFRNONAA 38 (L) 11/06/2019   GFRAA 44 (L) 11/06/2019    Lab Results  Component Value Date   WBC 0.4 (LL) 11/06/2019   NEUTROABS 0.0 (L) 11/06/2019   HGB 8.1 (L) 11/06/2019   HCT 25.3 (L) 11/06/2019   MCV 91.0 11/06/2019   PLT 49 (L) 11/06/2019     STUDIES: DG Chest 1 View  Result Date: 10/16/2019 CLINICAL DATA:  Dyspnea EXAM: CHEST  1 VIEW COMPARISON:  None. FINDINGS: A right Port-A-Cath terminates in the right atrium, stable. No pneumothorax. Haziness over the  right hemithorax seen on two views. There is significant patient rotation. No focal infiltrate seen on the left. The cardiomediastinal silhouette is stable. IMPRESSION: The study is limited due to patient body habitus and significant patient rotation. Haziness over the right hemithorax could be due to patient rotation. A layering effusion or asymmetric edema could have a similar appearance. Recommend a better position chest x-ray for further evaluation. No other acute abnormalities. Electronically Signed   By: Dorise Bullion III M.D   On: 10/16/2019 21:15   CT Angio Chest PE W and/or Wo Contrast  Result Date: 10/20/2019 CLINICAL DATA:  Dyspnea on exertion EXAM: CT ANGIOGRAPHY CHEST WITH CONTRAST TECHNIQUE: Multidetector CT imaging of the chest was performed using the standard protocol during bolus administration of intravenous contrast. Multiplanar CT image reconstructions and MIPs were obtained to evaluate the vascular anatomy. CONTRAST:  9mL OMNIPAQUE IOHEXOL 350 MG/ML SOLN COMPARISON:  02/05/2018 and chest x-ray of the same date FINDINGS: Cardiovascular: Heart size is enlarged without pericardial effusion. Findings similar to the prior study. Aortic caliber 4 cm of the  ascending thoracic aorta. No signs of pulmonary embolism. Mildly limited assessment of the lung bases due to respiratory motion. Mediastinum/Nodes: Right-sided central venous access device terminates in the right atrium. Pre pericardial lymph node measuring 1.5 cm new from previous exams. No additional signs of mediastinal adenopathy. Esophagus grossly normal. Mildly nodular thyroid similar to prior study. Lungs/Pleura: Small pulmonary nodules scattered throughout the right chest some within some without calcification. New pulmonary nodule right lung apex (image 20, series 6) 6 mm New right upper lobe pulmonary nodule (image 61, series 6) 6 mm New pulmonary nodule in the right lower lobe (image 84, series 6) 6 mm. Signs of basilar atelectasis on the left. No signs of pleural effusion Small left upper lobe pulmonary nodule, also appears new compared to the previous study. Upper Abdomen: Signs of hepatic metastatic disease not well evaluated. Suggestion of diffuse infiltration of the left hepatic lobe. Small amount perisplenic fluid has developed also since the study of 08/13/2019. Findings suggest interval worsening of disease in the liver though again this areas not well evaluated currently. Bulky celiac lymph node (image 88, series 4) 3.3 cm in short axis unchanged. Musculoskeletal: No destructive bone process. No acute bone finding. Review of the MIP images confirms the above findings. IMPRESSION: 1. Negative for pulmonary embolism. 2. Interval development of multiple bilateral pulmonary nodules, noncalcified findings are suspicious for metastatic disease. 3. Interval development of a 1.5 cm pre pericardial lymph node (image 134, series 5), suspicious for metastatic disease. 4. Signs of hepatic metastatic disease not well evaluated currently, but appearing worse than on the prior study. 5. Small amount of perisplenic fluid has developed since the previous study. 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 Aortic Atherosclerosis (ICD10-I70.0). Electronically Signed   By: Zetta Bills M.D.   On: 10/20/2019 12:35   US Abdomen Complete  Result Date: 11/05/2019 CLINICAL DATA:  Abdominal distension. History of metastatic colon cancer. EXAM: ABDOMEN ULTRASOUND COMPLETE COMPARISON:  CT 10/21/2019 FINDINGS: Exam limited due to patient's inability to move, bowel gas and body habitus. Gallbladder: Evidence of gallbladder sludge without discrete stones. Gallbladder wall edema measuring 7-8 mm. Small amount of pericholecystic fluid. Negative sonographic Murphy sign. Common bile duct: Diameter: 5.9 mm. Liver: Moderate diffuse heterogeneity compatible recent CT findings of worsening diffuse metastatic disease. Portal vein is patent on color Doppler imaging with normal direction of blood flow towards  the liver. IVC: No abnormality visualized. Pancreas: Visualized portion unremarkable. Spleen: Size and appearance within normal limits. Right Kidney: Length: 11.5 cm. Echogenicity within normal limits. No mass or hydronephrosis visualized. Left Kidney: Length: Not visualized. Abdominal aorta: No aneurysm visualized. Other findings: None. IMPRESSION: 1. Moderate diffuse heterogeneity of the liver compatible with recent CT findings of progression of metastatic disease of patient's known colon carcinoma. 2. Moderate gallbladder sludge with nonspecific gallbladder wall edema measuring 7-8 mm. Recommend clinical correlation for acute cholecystitis. Electronically Signed   By: Marin Olp M.D.   On: 11/05/2019 09:54   CT ABDOMEN PELVIS W CONTRAST  Result Date: 10/21/2019 CLINICAL DATA:  Metastatic colon cancer. Abdominal distension, weakness and diarrhea. EXAM: CT ABDOMEN AND PELVIS WITH CONTRAST TECHNIQUE: Multidetector CT imaging of the abdomen and pelvis was performed using the standard protocol following bolus administration of intravenous contrast. CONTRAST:  116mL  OMNIPAQUE IOHEXOL 300 MG/ML  SOLN COMPARISON:  Chest CT from yesterday and CT abdomen/pelvis 08/13/2019 FINDINGS: Lower chest: A few small pulmonary nodules are noted and consistent with metastatic disease as demonstrated on yesterday's chest CT. Streaky left basilar atelectasis. No effusions or infiltrates. The heart is normal in size. No pericardial effusion. Hepatobiliary: Severe and markedly progressive hepatic metastatic disease. There is now a large confluent mass lesion occupying the left hepatic lobe and measuring 14.5 x 12.0 cm on image 36/2. Numerous lesions are also noted throughout the right hepatic lobe and these have also enlarged. Exophytic tumor versus adjacent adenopathy near the left hepatic lobe on image number 28/2 measures 4 cm. This is unchanged in size. No biliary dilatation. Pancreas: No mass, inflammation or ductal dilatation. Spleen: Normal size.  No focal lesions. Adrenals/Urinary Tract: Stable bilateral adrenal gland nodules. These are likely benign adenomas. The kidneys are unremarkable. No renal lesions or hydronephrosis. The bladder is normal. Stomach/Bowel: The stomach, duodenum and small bowel are unremarkable. No mass lesions or obstructive findings. Stable surgical changes from a right hemicolectomy. Colonic diverticulosis but no findings for obstructing mass. Vascular/Lymphatic: Moderate scattered atherosclerotic calcifications involving the aorta and iliac arteries. No aneurysm or dissection. The major venous structures are patent. Enlarging omental and peritoneal surface lesions. The largest omental lesion measures 3.9 cm on image 53/2. This previously measured 3.1 cm. Other smaller lesions have enlarged slightly also. No retroperitoneal lymphadenopathy. Reproductive: The uterus and ovaries are unremarkable. Other: No pelvic mass, pelvic adenopathy or free pelvic fluid collections. No inguinal mass or adenopathy. Musculoskeletal: No significant bony findings. IMPRESSION: 1.  Significant progression of metastatic hepatic disease suggesting a very aggressive neoplasm. 2. Enlarging omental and peritoneal surface lesions. 3. Small pulmonary nodules consistent with pulmonary metastatic disease. Electronically Signed   By: Marijo Sanes M.D.   On: 10/21/2019 16:12   DG Chest Port 1 View  Result Date: 11/05/2019 CLINICAL DATA:  Shortness of breath. EXAM: PORTABLE CHEST 1 VIEW COMPARISON:  Chest radiograph 10/22/2019 FINDINGS: Right anterior chest wall Port-A-Cath is present with tip projecting over the right atrium. Stable cardiomegaly. Aortic atherosclerosis. Improved aeration of the right lung with persistent hazy opacification. No definite pleural effusion or pneumothorax. Thoracic spine degenerative changes. IMPRESSION: Improved hazy opacities over the right lung may be secondary to overlying soft tissues. Underlying effusion or pulmonary opacities are not excluded. Electronically Signed   By: Lovey Newcomer M.D.   On: 11/05/2019 06:34   DG Chest Port 1 View  Result Date: 10/20/2019 CLINICAL DATA:  Dyspnea. EXAM: PORTABLE CHEST 1 VIEW COMPARISON:  08/13/2019 FINDINGS: The heart size  and pulmonary vascularity are and the lungs clear. Aortic atherosclerosis. New power port in place. Tip is in superior vena cava just below the carina. No significant bone abnormality. IMPRESSION: 1. No active disease. 2.  Aortic Atherosclerosis (ICD10-I70.0). 3. Power port in good position. Electronically Signed   By: Lorriane Shire M.D.   On: 10/20/2019 10:26   ECHOCARDIOGRAM COMPLETE  Result Date: 10/21/2019   ECHOCARDIOGRAM REPORT   Patient Name:   KEYANA Crum  Date of Exam: 10/21/2019 Medical Rec #:  [id number]  Height:       66.0 in Accession #:    [id number] Weight:       270.0 lb Date of Birth:  [birth year]  BSA:          2.27 m Patient Age:    10 years   BP:           131/55 mmHg Patient Gender: F          HR:           108 bpm. Exam Location:  Inpatient Procedure: 2D Echo Indications:    Syncope  780.2 / R55  History:        Patient has prior history of Echocardiogram examinations, most                 recent 02/06/2018. CHF. Colon cancer. Chronic kidney disease.                 Anemia.  Sonographer:    Darlina Sicilian RDCS Referring Phys: [id number] [place]  1. Left ventricular ejection fraction, by visual estimation, is 60 to 65%. The left ventricle has normal function. There is moderately increased left ventricular hypertrophy.  2. Left ventricular diastolic parameters are consistent with Grade II diastolic dysfunction (pseudonormalization).  3. Elevated left atrial pressure.  4. Global right ventricle has normal systolic function.The right ventricular size is normal.  5. Left atrial size was normal.  6. Right atrial size was normal.  7. The mitral valve is normal in structure. No evidence of mitral valve regurgitation.  8. The tricuspid valve is normal in structure.  9. The aortic valve was not well visualized. Aortic valve regurgitation is mild. Mild to moderate aortic valve sclerosis/calcification without any evidence of aortic stenosis. 10. The pulmonic valve was not well visualized. Pulmonic valve regurgitation is not visualized. 11. The inferior vena cava is normal in size with <50% respiratory variability, suggesting right atrial pressure of 8 mmHg. 12. TR signal is inadequate for assessing pulmonary artery systolic pressure. 13. There is dilatation of the ascending aorta measuring 41 mm. FINDINGS  Left Ventricle: Left ventricular ejection fraction, by visual estimation, is 60 to 65%. The left ventricle has normal function. The left ventricle has no regional wall motion abnormalities. There is moderately increased left ventricular hypertrophy. Left ventricular diastolic parameters are consistent with Grade II diastolic dysfunction (pseudonormalization). Elevated left atrial pressure. Right Ventricle: The right ventricular size is normal. No increase in right ventricular wall thickness.  Global RV systolic function is has normal systolic function. Left Atrium: Left atrial size was normal in size. Right Atrium: Right atrial size was normal in size Pericardium: Trivial pericardial effusion is present. Mitral Valve: The mitral valve is normal in structure. No evidence of mitral valve regurgitation. Tricuspid Valve: The tricuspid valve is normal in structure. Tricuspid valve regurgitation is not demonstrated. Aortic Valve: The aortic valve was not well visualized. Aortic valve regurgitation is mild. Aortic regurgitation PHT measures 465 msec. Mild  to moderate aortic valve sclerosis/calcification is present, without any evidence of aortic stenosis. Pulmonic Valve: The pulmonic valve was not well visualized. Pulmonic valve regurgitation is not visualized. Pulmonic regurgitation is not visualized. Aorta: Aortic dilatation noted. There is dilatation of the ascending aorta measuring 41 mm. Venous: The inferior vena cava is normal in size with less than 50% respiratory variability, suggesting right atrial pressure of 8 mmHg. IAS/Shunts: The interatrial septum was not well visualized.  LEFT VENTRICLE PLAX 2D LVIDd:         3.70 cm  Diastology LVIDs:         2.30 cm  LV e' lateral:   5.66 cm/s LV PW:         1.30 cm  LV E/e' lateral: 17.2 LV IVS:        1.40 cm  LV e' medial:    5.77 cm/s LVOT diam:     2.00 cm  LV E/e' medial:  16.9 LV SV:         40 ml LV SV Index:   16.31 LVOT Area:     3.14 cm  RIGHT VENTRICLE RV S prime:     24.90 cm/s TAPSE (M-mode): 1.9 cm LEFT ATRIUM             Index       RIGHT ATRIUM           Index LA diam:        4.40 cm 1.94 cm/m  RA Area:     13.00 cm LA Vol (A2C):   58.9 ml 25.93 ml/m RA Volume:   29.50 ml  12.99 ml/m LA Vol (A4C):   64.8 ml 28.52 ml/m LA Biplane Vol: 68.0 ml 29.93 ml/m  AORTIC VALVE LVOT Vmax:   108.00 cm/s LVOT Vmean:  73.600 cm/s LVOT VTI:    0.169 m AI PHT:      465 msec  AORTA Ao Root diam: 3.10 cm Ao Asc diam:  4.10 cm MITRAL VALVE MV Area (PHT):  4.36 cm              SHUNTS MV PHT:        50.46 msec            Systemic VTI:  0.17 m MV Decel Time: 174 msec              Systemic Diam: 2.00 cm MV E velocity: 97.40 cm/s  103 cm/s MV A velocity: 106.00 cm/s 70.3 cm/s MV E/A ratio:  0.92        1.5  Oswaldo Milian MD Electronically signed by Oswaldo Milian MD Signature Date/Time: 10/21/2019/7:54:01 PM    Final     ASSESSMENT: Progressive stage IV colon cancer, pancytopenia, possible obstruction.  PLAN:    1.  Progressive stage IV colon cancer: Patient recently noted to have rapidly progressive disease and was initiated on second line chemotherapy using FOLFIRI approximately 8 days ago.  Her primary oncologist is at [facility].  Patient reports she did not receive Neulasta.  This is likely the cause of her pancytopenia.  Her next scheduled chemotherapy is on November 10, 2019.  This will likely be delayed given her declining performance status and persistent pancytopenia. 2.  Pancytopenia: Likely secondary to chemotherapy.  Although patient's severe anemia may also be secondary to GI bleed.  Plus or thrombocytopenia may be lower than expected secondary to underlying DIC.  Continue to support with transfusions.  Maintain hemoglobin greater than 7.0.  If patient is  actively bleeding, try to maintain platelet count greater than 50,000.  If GI bleed subsides, maintain platelet count greater than 20,000. 3.  Thrombocytopenia: Partially secondary to chemotherapy as above. INR and prothrombin time improved.  Elevated fibrinogen and fibrin degradation products suggest underlying DIC.  Continue to monitor daily platelet count.  Transfuse as above. 4.  Coagulopathy: Possibly related to DIC, monitor.  Xarelto has been discontinued. 5.  GI bleed/abdominal pain: Appreciate GI and surgical input.  Conservative management for now given patient's pancytopenia.  NG tube has been placed. 6.  Neutropenia: Will initiate daily Granix until [place] greater than  1000.  Will follow.  Lloyd Huger, MD   11/06/2019 12:36 PM

## 2019-11-06 NOTE — Progress Notes (Signed)
Pharmacy Antibiotic Note  Amber Medina is a 69 y.o. female admitted on 11/02/2019 with febrile neutropenia.  Pharmacy has been consulted for Cefepime dosing.  Plan:    continue  Cefepime 2 gm IV Q12H ordered to start on 1/29 @ 2230    Weight: (!) 302 lb 11.1 oz (137.3 kg)  Temp (24hrs), Avg:98.5 F (36.9 C), Min:97.9 F (36.6 C), Max:98.9 F (37.2 C)  Recent Labs  Lab 10/18/2019 1944 10/19/2019 1944 11/05/19 0518 11/05/19 0650 11/05/19 1147 11/05/19 1743 11/05/19 2357 11/06/19 0410  WBC 0.5*   < > 0.6*  --  0.5* 0.5*  0.5* 0.5* 0.4*  CREATININE 1.46*  --  1.58*  --   --   --   --  1.40*  LATICACIDVEN  --   --   --  1.4  --   --   --   --    < > = values in this interval not displayed.    Estimated Creatinine Clearance: 52.7 mL/min (A) (by C-G formula based on SCr of 1.4 mg/dL (H)).    Allergies  Allergen Reactions  . Compazine [Prochlorperazine Edisylate] Swelling  . Zyrtec [Cetirizine] Swelling    Swelling of nose    Antimicrobials this admission:  Cefepime 1/29  >>    >>   Dose adjustments this admission:   Microbiology results:  BCx:   UCx:    Sputum:    MRSA PCR: neg  Thank you for allowing pharmacy to be a part of this patient's care.  Cookie Pore A 11/06/2019 11:00 AM

## 2019-11-06 NOTE — Progress Notes (Signed)
Subjective:  CC: Amber Medina is a 69 y.o. female  Hospital stay day 2,   pancytopenia and vomiting  HPI: No acute issues overnight.  No flatus or BM, still feels distended  ROS:  General: Denies weight loss, weight gain, fatigue, fevers, chills, and night sweats. Heart: Denies chest pain, palpitations, racing heart, irregular heartbeat, leg pain or swelling, and decreased activity tolerance. Respiratory: Denies breathing difficulty, shortness of breath, wheezing, cough, and sputum. GI: Denies change in appetite, heartburn, nausea, vomiting, constipation, diarrhea, and blood in stool. GU: Denies difficulty urinating, pain with urinating, urgency, frequency, blood in urine.   Objective:   Temp:  [97.9 F (36.6 C)-98.9 F (37.2 C)] 98.1 F (36.7 C) (01/31 1215) Pulse Rate:  [74-136] 77 (01/31 1245) Resp:  [13-26] 13 (01/31 1245) BP: (62-123)/(37-84) 91/50 (01/31 1245) SpO2:  [90 %-97 %] 97 % (01/31 1245)       Weight: (!) 137.3 kg BMI (Calculated): 48.88   Intake/Output this shift:   Intake/Output Summary (Last 24 hours) at 11/06/2019 1428 Last data filed at 11/06/2019 1200 Gross per 24 hour  Intake 2991.13 ml  Output 3050 ml  Net -58.87 ml    Constitutional :  alert, cooperative, appears stated age and no distress  Respiratory:  clear to auscultation bilaterally  Cardiovascular:  regular rate and rhythm  Gastrointestinal: soft, no guarding, still distended on exam.  minimal epigastric tenderness.   Skin: Cool and moist.   Psychiatric: Normal affect, non-agitated, not confused       LABS:  CMP Latest Ref Rng & Units 11/06/2019 11/05/2019 10/20/2019  Glucose 70 - 99 mg/dL 135(H) 149(H) 138(H)  BUN 8 - 23 mg/dL 37(H) 39(H) 34(H)  Creatinine 0.44 - 1.00 mg/dL 1.40(H) 1.58(H) 1.46(H)  Sodium 135 - 145 mmol/L 134(L) 133(L) 131(L)  Potassium 3.5 - 5.1 mmol/L 3.5 4.0 4.0  Chloride 98 - 111 mmol/L 104 105 102  CO2 22 - 32 mmol/L 18(L) 19(L) 19(L)  Calcium 8.9 - 10.3 mg/dL 7.8(L)  7.6(L) 8.0(L)  Total Protein 6.5 - 8.1 g/dL 5.3(L) 5.5(L) 6.1(L)  Total Bilirubin 0.3 - 1.2 mg/dL 5.2(H) 3.0(H) 2.5(H)  Alkaline Phos 38 - 126 U/L 120 118 149(H)  AST 15 - 41 U/L 8(L) 11(L) 12(L)  ALT 0 - 44 U/L 8 9 11    CBC Latest Ref Rng & Units 11/06/2019 11/06/2019 11/05/2019  WBC 4.0 - 10.5 K/uL 0.5(LL) 0.4(LL) 0.5(LL)  Hemoglobin 12.0 - 15.0 g/dL 8.0(L) 8.1(L) 8.2(L)  Hematocrit 36.0 - 46.0 % 25.2(L) 25.3(L) 26.0(L)  Platelets 150 - 400 K/uL 42(L) 49(L) 52(L)    RADS: n/a Assessment:   Reported feculant emesis, pancytopenia, abdominal distention, heme positive stools.  Continue current management.  Pt still requiring pressor support, so no rush to proceed with further imaging such as SBFT since we will not be able to treat any pathology that maybe noted on the exam at this point due to her pancytopenia.  She is not in acute distress.  Continue ICU support and monitor until lab numbers continue to improve

## 2019-11-06 NOTE — Consult Note (Addendum)
WOC Nurse Consult Note: Reason for Consult:Moisture associated skin damage (MASD) to posterior and medial thighs, perineum and buttocks. Moisture accumulation in the intertriginous areas of bilateral inguinal and subpannicular skin folds Wound type:Moisture Pressure Injury POA: N/A Measurement: scattered, diffuse Wound [id number] openings, pink, moist Drainage (amount, consistency, odor) Scant, serous Periwound:erythematous Dressing procedure/placement/frequency: Patient is on a bariatric mattress replacement with low air loss feature and is being turned and repositioned from side to side. I have added a prophylactic silicone foam dressing to the sacrum, Gerhart's Butt Cream (a compounded 1:1:1 hydrocortisone/lotrimin/zinc oxide preparation) three times daily to the affected areas.  Due to the lateral rotation of the feet and increased pressure on the lateral heels, I will provide pressure redistribution heel boots. Our house antimicrobial wicking textile (InterDry) is provided to assist with microclimate management in the subpannicular and bilateral inguinal skin folds.  [place] nursing team will not follow, but will remain available to this patient, the nursing and medical teams.  Please re-consult if needed. Thanks, Maudie Flakes, MSN, RN, [place], Arther Abbott  Pager# [phone]

## 2019-11-06 NOTE — Progress Notes (Signed)
   11/06/19 1606  Clinical Encounter Type  Visited With Patient;Patient and family together  Visit Type Initial  Referral From Nurse  Consult/Referral To [place] (Comment)  [place] received page at [phone] to visit pt and daughter who were exploring options for AD. [place] educated both and provided pastoral care by building trust and being a non-judgmental presence. PT will talk with personal legal representative in regards to AD. No further needs expressed at this time.

## 2019-11-06 NOTE — Progress Notes (Signed)
Amber Lame, MD [address] [place], [place] [address] Phone: [phone] Fax : [phone]   Subjective: Patient was seen today and her daughter was present.  The patient states that she still has abdominal distention.  The patient's hemoglobin has been stable and she has pancytopenia.  The patient reports continuing to be bloated.  She has been running a low blood pressure with a systolic in the [id number].   Objective: Vital signs in last 24 hours: Vitals:   11/06/19 1200 11/06/19 1215 11/06/19 1230 11/06/19 1245  BP: (!) 93/56 (!) 76/40 (!) 75/47 (!) 91/50  Pulse: 76 74 78 77  Resp: 18 (!) 22 17 13   Temp:  98.1 F (36.7 C)    TempSrc:      SpO2: 96% 95% 95% 97%  Weight:       Weight change:   Intake/Output Summary (Last 24 hours) at 11/06/2019 1254 Last data filed at 11/06/2019 1200 Gross per 24 hour  Intake 3401.13 ml  Output 3450 ml  Net -48.87 ml     Exam: Heart:: Regular rate and rhythm, S1S2 present or without murmur or extra heart sounds Lungs: normal and clear to auscultation and percussion Abdomen: Distended tympanic with decreased bowel sounds   Lab Results: @LABTEST2 @ Micro Results: Recent Results (from the past 240 hour(s))  SARS CORONAVIRUS 2 (TAT 6-24 HRS) Nasopharyngeal Nasopharyngeal Swab     Status: None   Collection Time: 10/27/19  1:56 PM   Specimen: Nasopharyngeal Swab  Result Value Ref Range Status   SARS Coronavirus 2 NEGATIVE NEGATIVE Final    Comment: (NOTE) SARS-CoV-2 target nucleic acids are NOT DETECTED. The SARS-CoV-2 RNA is generally detectable in upper and lower respiratory specimens during the acute phase of infection. Negative results do not preclude SARS-CoV-2 infection, do not rule out co-infections with other pathogens, and should not be used as the sole basis for treatment or other patient management decisions. Negative results must be combined with clinical observations, patient history, and epidemiological  information. The expected result is Negative. Fact Sheet for Patients: SugarRoll.be Fact Sheet for Healthcare Providers: https://www.woods-mathews.com/ This test is not yet approved or cleared by the [place] FDA and  has been authorized for detection and/or diagnosis of SARS-CoV-2 by FDA under an Emergency Use Authorization (EUA). This EUA will remain  in effect (meaning this test can be used) for the duration of the COVID-19 declaration under Section 56 4(b)(1) of the Act, 21 U.S.C. section 360bbb-3(b)(1), unless the authorization is terminated or revoked sooner. Performed at [facility], [place] [address] [place], [place] [address]   Respiratory Panel by RT PCR (Flu A&B, Covid) - Nasopharyngeal Swab     Status: None   Collection Time: 10/28/2019 10:12 PM   Specimen: Nasopharyngeal Swab  Result Value Ref Range Status   SARS Coronavirus 2 by RT PCR NEGATIVE NEGATIVE Final    Comment: (NOTE) SARS-CoV-2 target nucleic acids are NOT DETECTED. The SARS-CoV-2 RNA is generally detectable in upper respiratoy specimens during the acute phase of infection. The lowest concentration of SARS-CoV-2 viral copies this assay can detect is 131 copies/mL. A negative result does not preclude SARS-Cov-2 infection and should not be used as the sole basis for treatment or other patient management decisions. A negative result may occur with  improper specimen collection/handling, submission of specimen other than nasopharyngeal swab, presence of viral mutation(s) within the areas targeted by this assay, and inadequate number of viral copies (<131 copies/mL). A negative result  must be combined with clinical observations, patient history, and epidemiological information. The expected result is Negative. Fact Sheet for Patients:  PinkCheek.be Fact Sheet for Healthcare Providers:  GravelBags.it This  test is not yet ap proved or cleared by the [place] FDA and  has been authorized for detection and/or diagnosis of SARS-CoV-2 by FDA under an Emergency Use Authorization (EUA). This EUA will remain  in effect (meaning this test can be used) for the duration of the COVID-19 declaration under Section 564(b)(1) of the Act, 21 U.S.C. section 360bbb-3(b)(1), unless the authorization is terminated or revoked sooner.    Influenza A by PCR NEGATIVE NEGATIVE Final   Influenza B by PCR NEGATIVE NEGATIVE Final    Comment: (NOTE) The Xpert Xpress SARS-CoV-2/FLU/RSV assay is intended as an aid in  the diagnosis of influenza from Nasopharyngeal swab specimens and  should not be used as a sole basis for treatment. Nasal washings and  aspirates are unacceptable for Xpert Xpress SARS-CoV-2/FLU/RSV  testing. Fact Sheet for Patients: PinkCheek.be Fact Sheet for Healthcare Providers: GravelBags.it This test is not yet approved or cleared by the [place] FDA and  has been authorized for detection and/or diagnosis of SARS-CoV-2 by  FDA under an Emergency Use Authorization (EUA). This EUA will remain  in effect (meaning this test can be used) for the duration of the  Covid-19 declaration under Section 564(b)(1) of the Act, 21  U.S.C. section 360bbb-3(b)(1), unless the authorization is  terminated or revoked. Performed at [facility], [address] [place], [place] [address]   MRSA PCR Screening     Status: None   Collection Time: 11/05/19 12:05 AM   Specimen: Nasopharyngeal  Result Value Ref Range Status   MRSA by PCR NEGATIVE NEGATIVE Final    Comment:        The GeneXpert MRSA Assay (FDA approved for NASAL specimens only), is one component of a comprehensive MRSA colonization surveillance program. It is not intended to diagnose MRSA infection nor to guide or monitor treatment for MRSA infections. Performed at  [facility], [address] [place], [place] [address]   CULTURE, BLOOD (ROUTINE X 2) w Reflex to ID Panel     Status: None (Preliminary result)   Collection Time: 11/05/19  6:45 AM   Specimen: BLOOD  Result Value Ref Range Status   Specimen Description BLOOD L FOREARM  Final   Special Requests   Final    BOTTLES DRAWN AEROBIC AND ANAEROBIC Blood Culture adequate volume   Culture   Final    NO GROWTH < 24 HOURS Performed at [facility], [address], [place], [place] [address]    Report Status PENDING  Incomplete  CULTURE, BLOOD (ROUTINE X 2) w Reflex to ID Panel     Status: None (Preliminary result)   Collection Time: 11/05/19  6:49 AM   Specimen: BLOOD  Result Value Ref Range Status   Specimen Description BLOOD RAC  Final   Special Requests   Final    BOTTLES DRAWN AEROBIC ONLY Blood Culture adequate volume   Culture   Final    NO GROWTH < 24 HOURS Performed at [facility], [address] [place],  [address]    Report Status PENDING  Incomplete  CULTURE, BLOOD (ROUTINE X 2) w Reflex to ID Panel     Status: None (Preliminary result)   Collection Time: 11/06/19  1:21 AM   Specimen: BLOOD  Result Value Ref Range Status   Specimen  Description BLOOD RIGHT ANTECUBITAL INNER  Final   Special Requests   Final    BOTTLES DRAWN AEROBIC AND ANAEROBIC Blood Culture adequate volume   Culture   Final    NO GROWTH < 12 HOURS Performed at [facility], [address] [place], [place] [address]    Report Status PENDING  Incomplete  CULTURE, BLOOD (ROUTINE X 2) w Reflex to ID Panel     Status: None (Preliminary result)   Collection Time: 11/06/19  1:34 AM   Specimen: BLOOD  Result Value Ref Range Status   Specimen Description BLOOD RIGHT ANTECUBITAL OUTER  Final   Special Requests   Final    BOTTLES DRAWN AEROBIC AND ANAEROBIC Blood Culture results may not be optimal due to an excessive volume of blood received in culture bottles    Culture   Final    NO GROWTH < 12 HOURS Performed at [facility], [address], [place], [place] [address]    Report Status PENDING  Incomplete   Studies/Results: DG Chest 1 View  Result Date: 11/01/2019 CLINICAL DATA:  Dyspnea EXAM: CHEST  1 VIEW COMPARISON:  None. FINDINGS: A right Port-A-Cath terminates in the right atrium, stable. No pneumothorax. Haziness over the right hemithorax seen on two views. There is significant patient rotation. No focal infiltrate seen on the left. The cardiomediastinal silhouette is stable. IMPRESSION: The study is limited due to patient body habitus and significant patient rotation. Haziness over the right hemithorax could be due to patient rotation. A layering effusion or asymmetric edema could have a similar appearance. Recommend a better position chest x-ray for further evaluation. No other acute abnormalities. Electronically Signed   By: Dorise Bullion III M.D   On: 10/19/2019 21:15   US Abdomen Complete  Result Date: 11/05/2019 CLINICAL DATA:  Abdominal distension. History of metastatic colon cancer. EXAM: ABDOMEN ULTRASOUND COMPLETE COMPARISON:  CT 10/21/2019 FINDINGS: Exam limited due to patient's inability to move, bowel gas and body habitus. Gallbladder: Evidence of gallbladder sludge without discrete stones. Gallbladder wall edema measuring 7-8 mm. Small amount of pericholecystic fluid. Negative sonographic Murphy sign. Common bile duct: Diameter: 5.9 mm. Liver: Moderate diffuse heterogeneity compatible recent CT findings of worsening diffuse metastatic disease. Portal vein is patent on color Doppler imaging with normal direction of blood flow towards the liver. IVC: No abnormality visualized. Pancreas: Visualized portion unremarkable. Spleen: Size and appearance within normal limits. Right Kidney: Length: 11.5 cm. Echogenicity within normal limits. No mass or hydronephrosis visualized. Left Kidney: Length: Not visualized. Abdominal aorta: No  aneurysm visualized. Other findings: None. IMPRESSION: 1. Moderate diffuse heterogeneity of the liver compatible with recent CT findings of progression of metastatic disease of patient's known colon carcinoma. 2. Moderate gallbladder sludge with nonspecific gallbladder wall edema measuring 7-8 mm. Recommend clinical correlation for acute cholecystitis. Electronically Signed   By: Marin Olp M.D.   On: 11/05/2019 09:54   DG Chest Port 1 View  Result Date: 11/05/2019 CLINICAL DATA:  Shortness of breath. EXAM: PORTABLE CHEST 1 VIEW COMPARISON:  Chest radiograph 10/07/2019 FINDINGS: Right anterior chest wall Port-A-Cath is present with tip projecting over the right atrium. Stable cardiomegaly. Aortic atherosclerosis. Improved aeration of the right lung with persistent hazy opacification. No definite pleural effusion or pneumothorax. Thoracic spine degenerative changes. IMPRESSION: Improved hazy opacities over the right lung may be secondary to overlying soft tissues. Underlying effusion or pulmonary opacities are not excluded. Electronically Signed   By: Lovey Newcomer M.D.   On: 11/05/2019  06:34   Medications: I have reviewed the patient's current medications. Scheduled Meds: . sodium chloride   Intravenous Once  . sodium chloride   Intravenous Once  . Chlorhexidine Gluconate Cloth  6 each Topical Daily  . Gerhardt's butt cream   Topical TID  . hydrocortisone sod succinate (SOLU-CORTEF) inj  50 mg Intravenous Q6H  . pantoprazole (PROTONIX) IV  40 mg Intravenous Q12H  . Tbo-Filgrastim  300 mcg Subcutaneous Daily   Continuous Infusions: . sodium chloride    . sodium chloride    . sodium chloride    . amiodarone 30 mg/hr (11/06/19 1127)  . ceFEPime (MAXIPIME) IV 2 g (11/06/19 0912)  . lactated ringers 100 mL/hr at 11/05/19 2100  . norepinephrine (LEVOPHED) Adult infusion 22 mcg/min (11/06/19 0600)  . phenylephrine (NEO-SYNEPHRINE) Adult infusion 200 mcg/min (11/06/19 1022)   PRN Meds:.morphine  injection, ondansetron (ZOFRAN) IV   Assessment: Principal Problem:   Pancytopenia ([place]) Active Problems:   Obesity, morbid (HCC)   GI bleed   Personal history of DVT (deep vein thrombosis)   Ascending aortic aneurysm (HCC)   Pancytopenia due to chemotherapy [facility])   Metastatic malignant neoplasm ([place])    Plan: This patient is here with what appears to be intestinal obstruction and a history of GI bleeding with a stable hemoglobin.  There is no further sign of any GI bleeding.  The patient had an NG tube that is now putting out yellow material and not brown as before.  The patient has not been stable enough to have a CT scan of the abdomen to delineate where her blockage may be.  I do not recommend any endoscopic procedures at this time.   LOS: 2 days   Amber Medina 11/06/2019, 12:54 PM Pager [phone] 7am-5pm  Check AMION for 5pm -7am coverage and on weekends

## 2019-11-07 LAB — PROCALCITONIN: Procalcitonin: 7.98 ng/mL

## 2019-11-07 LAB — PATHOLOGIST SMEAR REVIEW

## 2019-11-07 LAB — GLUCOSE, CAPILLARY: Glucose-Capillary: 134 mg/dL — ABNORMAL HIGH (ref 70–99)

## 2019-11-07 MED ORDER — HALOPERIDOL LACTATE 5 MG/ML IJ SOLN: 1.0000 mg | Freq: Four times a day (QID) | INTRAMUSCULAR | Status: DC | PRN

## 2019-11-07 MED ORDER — MORPHINE 100MG IN NS 100ML (1MG/ML) PREMIX INFUSION
1.0000 mg/h | INTRAVENOUS | Status: DC
  Administered 2019-11-07: 2 mg/h via INTRAVENOUS
  Administered 2019-11-07: 17:00:00 1 mg/h via INTRAVENOUS
  Filled 2019-11-07 (×2): qty 100

## 2019-11-07 MED ORDER — LORAZEPAM 2 MG/ML IJ SOLN
1.0000 mg | INTRAMUSCULAR | Status: DC | PRN
  Administered 2019-11-07: 18:00:00 2 mg via INTRAVENOUS
  Filled 2019-11-07: qty 1

## 2019-11-07 MED ORDER — LORAZEPAM 2 MG/ML IJ SOLN: 2.0000 mg | INTRAMUSCULAR | Status: DC | PRN

## 2019-11-07 DEATH — deceased

## 2019-11-09 LAB — VON WILLEBRAND PANEL
Coagulation Factor VIII: 454 % — ABNORMAL HIGH (ref 56–140)
Ristocetin Co-factor, Plasma: 372 % — ABNORMAL HIGH (ref 50–200)
Von Willebrand Antigen, Plasma: 351 % — ABNORMAL HIGH (ref 50–200)

## 2019-11-09 LAB — COAG STUDIES INTERP REPORT

## 2019-11-09 LAB — FACTOR 5 ASSAY: Factor V Activity: 70 % (ref 70–150)

## 2019-11-10 ENCOUNTER — Inpatient Hospital Stay: Payer: Medicare HMO

## 2019-11-10 ENCOUNTER — Inpatient Hospital Stay: Payer: Medicare HMO | Admitting: Nurse Practitioner

## 2019-11-10 LAB — CULTURE, BLOOD (ROUTINE X 2)
Culture: NO GROWTH
Culture: NO GROWTH
Special Requests: ADEQUATE
Special Requests: ADEQUATE

## 2019-11-11 LAB — CULTURE, BLOOD (ROUTINE X 2)
Culture: NO GROWTH
Culture: NO GROWTH
Special Requests: ADEQUATE

## 2019-11-16 ENCOUNTER — Telehealth: Payer: Medicare HMO | Admitting: Cardiology

## 2019-11-17 NOTE — Progress Notes (Signed)
Error

## 2019-11-20 ENCOUNTER — Other Ambulatory Visit: Payer: Self-pay | Admitting: Oncology

## 2019-12-05 NOTE — Progress Notes (Signed)
CH encountered pt.'s son (BJ) and dtr. (Kris/Chris) outside pt.'s rm. and introduced himself; procured chairs for visitors and briefly informed pt. of [place] availability before allowing pt. time w/family.  CH remains available as needed.       11/28/2019 1100  Clinical Encounter Type  Visited With Patient;Health care provider  Visit Type Initial;Critical Care;Social support

## 2019-12-05 NOTE — Progress Notes (Signed)
CRITICAL CARE NOTE 69 yo female with metastatic colon cancer to the liver receiving palliative chemotherapy admitted with hypotension and pancytopenia secondary to acute GI bleed (on xarelto) and hypovolemia due to poor po intake requiring levophed gtt   SIGNIFICANT EVENTS/STUDIES:  01/29: Pt admitted to the stepdown unit with pancytopenia secondary to GI bleed and chemotherapy treatment  01/30: Pt transferred to ICU due to hypotension despite multiple blood products requiring levophed gtt  02/01 transitioning to comfort care  CC  follow up shock  SUBJECTIVE Patient is terminally ill Prognosis is terminal On multiple pressors NG to suction +SBO/ilieus   BP 110/63   Pulse 73   Temp 98.2 F (36.8 C)   Resp 15   Wt (!) 137.3 kg   LMP  (LMP Unknown)   SpO2 96%   BMI 48.86 kg/m    I/O last 3 completed shifts: In: 6342.4 [P.O.:400; I.V.:4744.9; IV Piggyback:1197.6] Out: [id number] [Urine:1650; Emesis/NG output:3000] No intake/output data recorded.  SpO2: 96 % O2 Flow Rate (L/min): 2 L/min   REVIEW OF SYSTEMS  PATIENT IS limited  DUE TO TERMINAL ILLNESS    PHYSICAL EXAMINATION:  GENERAL: Appears terminal, still able to communicate states "I feel miserable" HEAD: Normocephalic, atraumatic.  EYES: Pupils equal, round, reactive to light. Pale conjunctiva MOUTH: Pale oral mucosa NECK: Supple.  PULMONARY: Coarse breath sounds, symmetrical CARDIOVASCULAR: S1 and S2. Regular rate and rhythm. No murmurs, rubs, or gallops.  GASTROINTESTINAL: Distended.  NO bowel sounds.  Taut abdomen. MUSCULOSKELETAL: Trace edema of the lower extremities NEUROLOGIC: lethargic but arousbale, states she is ready for comfort SKIN: Pale, cool  MEDICATIONS: I have reviewed all medications and confirmed regimen as documented   CULTURE RESULTS   Recent Results (from the past 240 hour(s))  Respiratory Panel by RT PCR (Flu A&B, Covid) - Nasopharyngeal Swab     Status: None   Collection Time:  10/12/2019 10:12 PM   Specimen: Nasopharyngeal Swab  Result Value Ref Range Status   SARS Coronavirus 2 by RT PCR NEGATIVE NEGATIVE Final    Comment: (NOTE) SARS-CoV-2 target nucleic acids are NOT DETECTED. The SARS-CoV-2 RNA is generally detectable in upper respiratoy specimens during the acute phase of infection. The lowest concentration of SARS-CoV-2 viral copies this assay can detect is 131 copies/mL. A negative result does not preclude SARS-Cov-2 infection and should not be used as the sole basis for treatment or other patient management decisions. A negative result may occur with  improper specimen collection/handling, submission of specimen other than nasopharyngeal swab, presence of viral mutation(s) within the areas targeted by this assay, and inadequate number of viral copies (<131 copies/mL). A negative result must be combined with clinical observations, patient history, and epidemiological information. The expected result is Negative. Fact Sheet for Patients:  PinkCheek.be Fact Sheet for Healthcare Providers:  GravelBags.it This test is not yet ap proved or cleared by the [place] FDA and  has been authorized for detection and/or diagnosis of SARS-CoV-2 by FDA under an Emergency Use Authorization (EUA). This EUA will remain  in effect (meaning this test can be used) for the duration of the COVID-19 declaration under Section 564(b)(1) of the Act, 21 U.S.C. section 360bbb-3(b)(1), unless the authorization is terminated or revoked sooner.    Influenza A by PCR NEGATIVE NEGATIVE Final   Influenza B by PCR NEGATIVE NEGATIVE Final    Comment: (NOTE) The Xpert Xpress SARS-CoV-2/FLU/RSV assay is intended as an aid in  the diagnosis of influenza from Nasopharyngeal swab specimens and  should  not be used as a sole basis for treatment. Nasal washings and  aspirates are unacceptable for Xpert Xpress SARS-CoV-2/FLU/RSV   testing. Fact Sheet for Patients: PinkCheek.be Fact Sheet for Healthcare Providers: GravelBags.it This test is not yet approved or cleared by the [place] FDA and  has been authorized for detection and/or diagnosis of SARS-CoV-2 by  FDA under an Emergency Use Authorization (EUA). This EUA will remain  in effect (meaning this test can be used) for the duration of the  Covid-19 declaration under Section 564(b)(1) of the Act, 21  U.S.C. section 360bbb-3(b)(1), unless the authorization is  terminated or revoked. Performed at [facility], [address] [place], [place] [address]   MRSA PCR Screening     Status: None   Collection Time: 11/05/19 12:05 AM   Specimen: Nasopharyngeal  Result Value Ref Range Status   MRSA by PCR NEGATIVE NEGATIVE Final    Comment:        The GeneXpert MRSA Assay (FDA approved for NASAL specimens only), is one component of a comprehensive MRSA colonization surveillance program. It is not intended to diagnose MRSA infection nor to guide or monitor treatment for MRSA infections. Performed at [facility], [address] [place], [place] [address]   CULTURE, BLOOD (ROUTINE X 2) w Reflex to ID Panel     Status: None (Preliminary result)   Collection Time: 11/05/19  6:45 AM   Specimen: BLOOD  Result Value Ref Range Status   Specimen Description BLOOD L FOREARM  Final   Special Requests   Final    BOTTLES DRAWN AEROBIC AND ANAEROBIC Blood Culture adequate volume   Culture   Final    NO GROWTH 2 DAYS Performed at [facility], [address], [place], [place] [address]    Report Status PENDING  Incomplete  CULTURE, BLOOD (ROUTINE X 2) w Reflex to ID Panel     Status: None (Preliminary result)   Collection Time: 11/05/19  6:49 AM   Specimen: BLOOD  Result Value Ref Range Status   Specimen Description BLOOD RAC  Final   Special Requests   Final    BOTTLES  DRAWN AEROBIC ONLY Blood Culture adequate volume   Culture   Final    NO GROWTH 2 DAYS Performed at [facility], [address], [place], [place] [address]    Report Status PENDING  Incomplete  CULTURE, BLOOD (ROUTINE X 2) w Reflex to ID Panel     Status: None (Preliminary result)   Collection Time: 11/06/19  1:21 AM   Specimen: BLOOD  Result Value Ref Range Status   Specimen Description BLOOD RIGHT ANTECUBITAL INNER  Final   Special Requests   Final    BOTTLES DRAWN AEROBIC AND ANAEROBIC Blood Culture adequate volume   Culture   Final    NO GROWTH 1 DAY Performed at [facility], [address] [place], [place] [address]    Report Status PENDING  Incomplete  CULTURE, BLOOD (ROUTINE X 2) w Reflex to ID Panel     Status: None (Preliminary result)   Collection Time: 11/06/19  1:34 AM   Specimen: BLOOD  Result Value Ref Range Status   Specimen Description BLOOD RIGHT ANTECUBITAL OUTER  Final   Special Requests   Final    BOTTLES DRAWN AEROBIC AND ANAEROBIC Blood Culture results may not be optimal due to an excessive volume of blood received in culture bottles   Culture   Final    NO  GROWTH 1 DAY Performed at [facility], [place], [place] [address]    Report Status PENDING  Incomplete      CBC    Component Value Date/Time   WBC 0.5 (LL) 11/06/2019 1233   RBC 2.75 (L) 11/06/2019 1233   HGB 8.0 (L) 11/06/2019 1233   HGB 8.6 (L) 09/27/2019 0815   HGB 11.0 (L) 06/25/2017 1147   HCT 25.2 (L) 11/06/2019 1233   HCT 35.0 06/25/2017 1147   PLT 42 (L) 11/06/2019 1233   PLT 246 09/27/2019 0815   PLT 359 06/25/2017 1147   MCV 91.6 11/06/2019 1233   MCV 78 (L) 06/25/2017 1147   MCH 29.1 11/06/2019 1233   MCHC 31.7 11/06/2019 1233   RDW 17.8 (H) 11/06/2019 1233   RDW 29.2 (H) 06/25/2017 1147   LYMPHSABS 0.3 (L) 11/06/2019 1233   LYMPHSABS 1.6 02/17/2017 1359   MONOABS 0.1 11/06/2019 1233   EOSABS 0.0 11/06/2019 1233   EOSABS 0.1  02/17/2017 1359   BASOSABS 0.0 11/06/2019 1233   BASOSABS 0.0 02/17/2017 1359   BMP Latest Ref Rng & Units 11/06/2019 11/05/2019 10/31/2019  Glucose 70 - 99 mg/dL 135(H) 149(H) 138(H)  BUN 8 - 23 mg/dL 37(H) 39(H) 34(H)  Creatinine 0.44 - 1.00 mg/dL 1.40(H) 1.58(H) 1.46(H)  BUN/Creat Ratio 12 - 28 - - -  Sodium 135 - 145 mmol/L 134(L) 133(L) 131(L)  Potassium 3.5 - 5.1 mmol/L 3.5 4.0 4.0  Chloride 98 - 111 mmol/L 104 105 102  CO2 22 - 32 mmol/L 18(L) 19(L) 19(L)  Calcium 8.9 - 10.3 mg/dL 7.8(L) 7.6(L) 8.0(L)         ASSESSMENT AND PLAN SYNOPSIS  Patient presented with pancytopenia related to palliative chemotherapy in the setting of metastatic colon cancer.  Also has developed an intestinal obstruction from said cancer.  She had been on palliative chemotherapy with the hopes that this would reduce obstructing symptoms however this has failed.  Patient has discussed with her family and has set her affairs in order and would like to proceed with comfort measures.  I have discussed with the surgical consultant Dr. Lysle Pearl who agrees with this plan of care as there is nothing surgical further to do.  Patient is therefore transition to comfort measures.  DNR/DNI, COMFORT MEASURES Prognosis is terminal.  Discussed with Dr. Lysle Pearl.  Renold Don, MD [facility]

## 2019-12-05 NOTE — Progress Notes (Addendum)
Subjective:  CC: Amber Medina is a 69 y.o. female  Hospital stay day 3,   pancytopenia and vomiting  HPI: No acute issues overnight.  Small flatus, but still feels distended.  Pain is worse today  ROS:  General: Denies weight loss, weight gain, fatigue, fevers, chills, and night sweats. Heart: Denies chest pain, palpitations, racing heart, irregular heartbeat, leg pain or swelling, and decreased activity tolerance. Respiratory: Denies breathing difficulty, shortness of breath, wheezing, cough, and sputum. GI: Denies change in appetite, heartburn, nausea, vomiting, constipation, diarrhea, and blood in stool. GU: Denies difficulty urinating, pain with urinating, urgency, frequency, blood in urine.   Objective:   Temp:  [97.7 F (36.5 C)-98.2 F (36.8 C)] 97.7 F (36.5 C) (02/01 1600) Pulse Rate:  [67-93] 81 (02/01 1600) Resp:  [9-24] 23 (02/01 1600) BP: (75-131)/(47-95) 102/71 (02/01 1600) SpO2:  [81 %-100 %] 96 % (02/01 1100)       Weight: (!) 137.3 kg BMI (Calculated): 48.88   Intake/Output this shift:   Intake/Output Summary (Last 24 hours) at 11-21-19 1845 Last data filed at November 21, 2019 1600 Gross per 24 hour  Intake 5026.59 ml  Output 3150 ml  Net 1876.59 ml    Constitutional :  alert, cooperative, appears stated age and no distress  Respiratory:  clear to auscultation bilaterally  Cardiovascular:  regular rate and rhythm  Gastrointestinal: soft, no guarding, still distended on exam.  increased epigastric tenderness.   Skin: Cool and moist.   Psychiatric: Normal affect, non-agitated, not confused       LABS:  CMP Latest Ref Rng & Units 11/06/2019 11/05/2019 10/26/2019  Glucose 70 - 99 mg/dL 135(H) 149(H) 138(H)  BUN 8 - 23 mg/dL 37(H) 39(H) 34(H)  Creatinine 0.44 - 1.00 mg/dL 1.40(H) 1.58(H) 1.46(H)  Sodium 135 - 145 mmol/L 134(L) 133(L) 131(L)  Potassium 3.5 - 5.1 mmol/L 3.5 4.0 4.0  Chloride 98 - 111 mmol/L 104 105 102  CO2 22 - 32 mmol/L 18(L) 19(L) 19(L)  Calcium  8.9 - 10.3 mg/dL 7.8(L) 7.6(L) 8.0(L)  Total Protein 6.5 - 8.1 g/dL 5.3(L) 5.5(L) 6.1(L)  Total Bilirubin 0.3 - 1.2 mg/dL 5.2(H) 3.0(H) 2.5(H)  Alkaline Phos 38 - 126 U/L 120 118 149(H)  AST 15 - 41 U/L 8(L) 11(L) 12(L)  ALT 0 - 44 U/L 8 9 11    CBC Latest Ref Rng & Units 11/06/2019 11/06/2019 11/05/2019  WBC 4.0 - 10.5 K/uL 0.5(LL) 0.4(LL) 0.5(LL)  Hemoglobin 12.0 - 15.0 g/dL 8.0(L) 8.1(L) 8.2(L)  Hematocrit 36.0 - 46.0 % 25.2(L) 25.3(L) 26.0(L)  Platelets 150 - 400 K/uL 42(L) 49(L) 52(L)    RADS: n/a Assessment:   Reported feculant emesis, pancytopenia, abdominal distention, heme positive stools.  Pt likely not going to resolve obstruction at this point.  Ok to proceed with pleasure feeds while NG is in to minimize emesis.  Discussed with patient that this will likely not resolve and she verbalized understanding.  Appreciated of allowing to have some [place] ice.  Surgery will sign off.  Please call with any additional questions or concerns

## 2019-12-05 NOTE — Discharge Summary (Signed)
Death Summary      Date of Admission: 10/07/2019 Date of Discharge: 2019/11/21 Time of Death: 2314-01-11  Cause of Death: 1. Metastatic colon cancer 2. Acute GI Bleed  Secondary Diagnosis: 1. Paroxysmal a-fib 2. 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 Anemia 4. CKD 3 5. Diastolic heart failure 6. Morbid obesity  Hospital Course: 69yo female w/a past medical history significant for metastatic colon cancer, paroxysmal a-fib, PE/DVT in 01-11-2018 on xarelto, anemia, CKD 3, diastolic heart failure, HTN  And morbid obesity who presented to the the ED on 10/16/2019 from her SNF facility due to abnormal labs. Plts 22, WBC 0.5, Hgb 6.2, INR 1.9 and t. Bili 2.5. Patient was receiving palliative chemo. Oncology was consulted and thought this was likely reaction to systemic chemo. ED did report melanotic stool and hemoccult positive. She received 1unit FFP, 1unit of Plts, 2units of PRBC. She was started on Cefepime and Protonix. In addition, the patient suffered an AKI likely due to hypovolemic shock. In addition to blood products, she was started on LR @100cc /hr. She was initially admitted to the step down unit. On 1/30, despite resuscitation w/blood products and crystalloid the patient remained hypotensive and was transferred to the intensive care unit. She was started on a levophed gtt. Palliative care was consulted given advanced disease process. On 2/1 the patient was transitioned to comfort care after discussion w/palliative care and chaplain. Comfort directed care was initiated.    Diagnostics: CBC Latest Ref Rng & Units 11/06/2019 11/06/2019 11/05/2019  WBC 4.0 - 10.5 K/uL 0.5(LL) 0.4(LL) 0.5(LL)  Hemoglobin 12.0 - 15.0 g/dL 8.0(L) 8.1(L) 8.2(L)  Hematocrit 36.0 - 46.0 % 25.2(L) 25.3(L) 26.0(L)  Platelets 150 - 400 K/uL 42(L) 49(L) 52(L)

## 2019-12-05 NOTE — Progress Notes (Signed)
Comfort care per patient request. Children called and informed of mothers wishes.

## 2019-12-05 NOTE — Progress Notes (Addendum)
Morphine drip started per patient request. Patient aware that children were not present.  Son and daughter in at 21. All other drips except LR at [facility] stopped per orders. Morphine drip increased to to 8 mgs quickly per patient request due to severe pain. Ativan 2mg s also given for air hunger.

## 2019-12-05 NOTE — Progress Notes (Signed)
Son and daughter in to see patient most of the day. Estate planning via Skype has taken place as well. Patient extremely uncomfortable but could only medicate after [facility] planning and document signing done. Family  to lawyers office to finish paperwork. Dr. Lysle Pearl in to see patient. Patient states she is ready for a Morphine drip. Children informed of their mothers wishes. Orders per Dr. Duwayne Heck.

## 2019-12-05 NOTE — Progress Notes (Signed)
Continues to be on Morphine drip. Unaware of surroundings. Family at peace with comfort care.

## 2019-12-05 DEATH — deceased

## 2019-12-29 IMAGING — CT CT BIOPSY
1 of 2 series · 15 of 32 positions shown, 19 images · non-contrast
Comparison: none

CLINICAL DATA: History of colon cancer in 7724 now presenting with
multiple hepatic masses as well as a left lower quadrant peritoneal
mass.

[Series 2: i-spiral 5.0 b31f · axial · 0.98mm/px · z∈[-118,+58]mm · 15 of 56 slices shown, 19 images]
[im 3/56  soft-tissue]
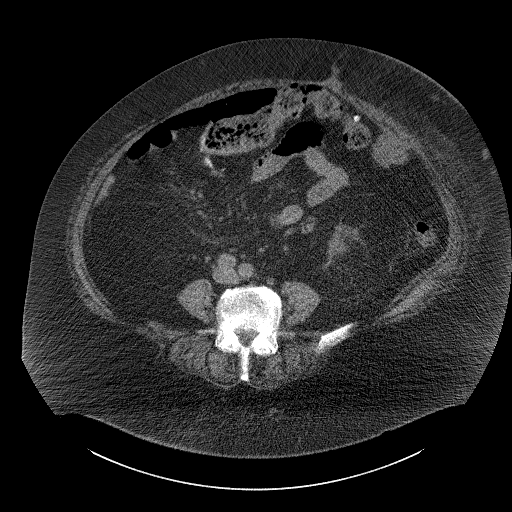
[im 3/56  bone]
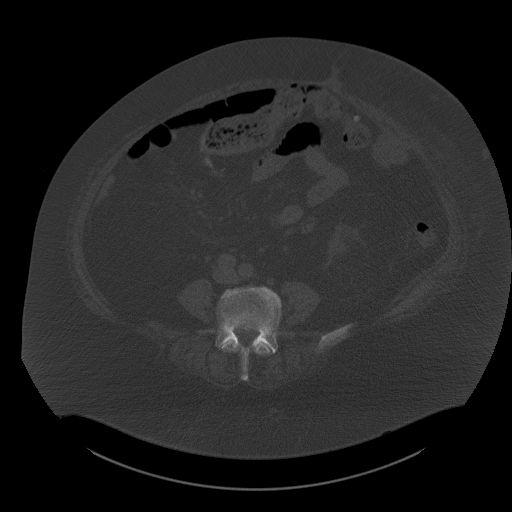
[im 8/56  soft-tissue]
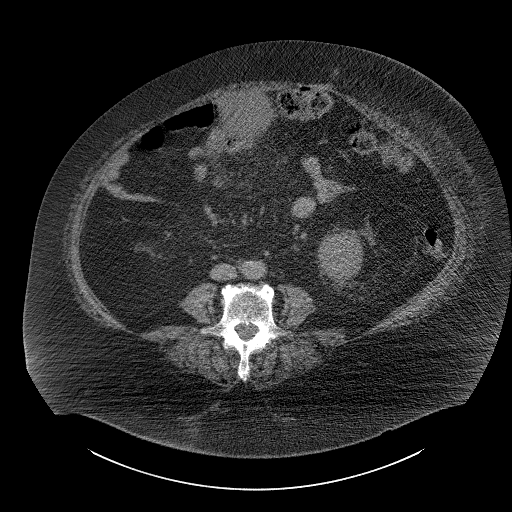
[im 13/56  soft-tissue]
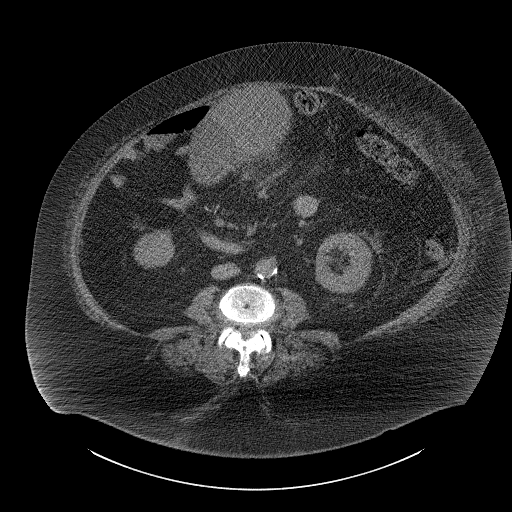
[im 16/56  soft-tissue]
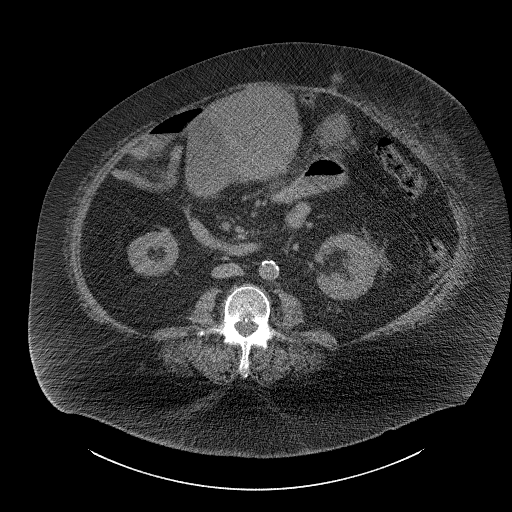
[im 21/56  soft-tissue]
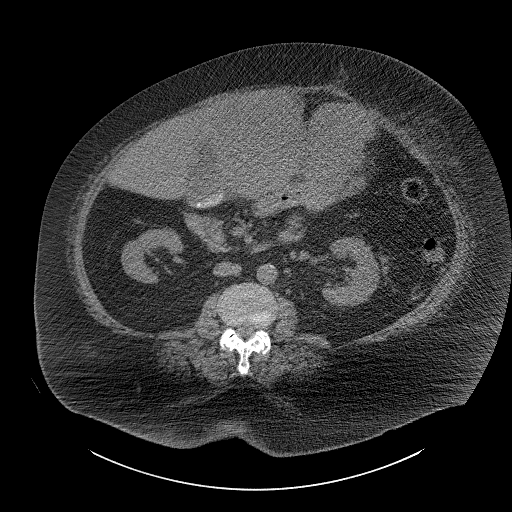
[im 23/56  soft-tissue]
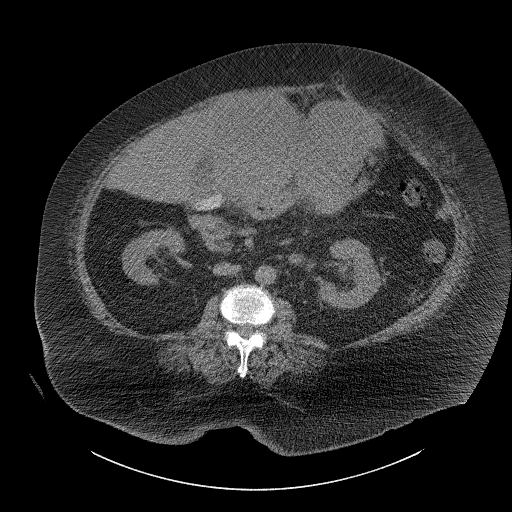
[im 28/56  soft-tissue]
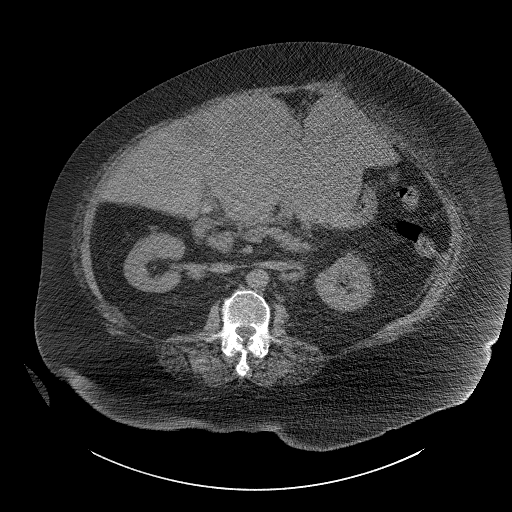
[im 33/56  soft-tissue]
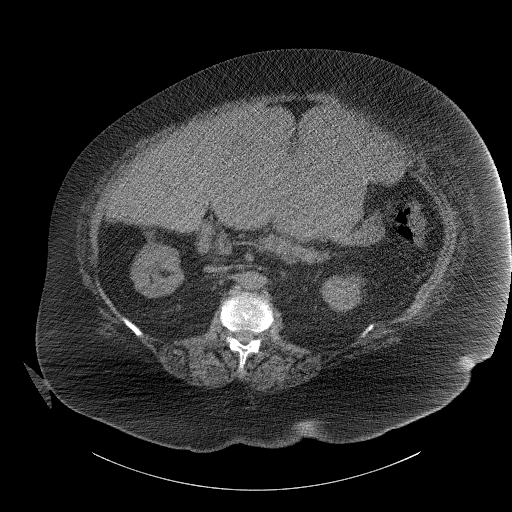
[im 36/56  soft-tissue]
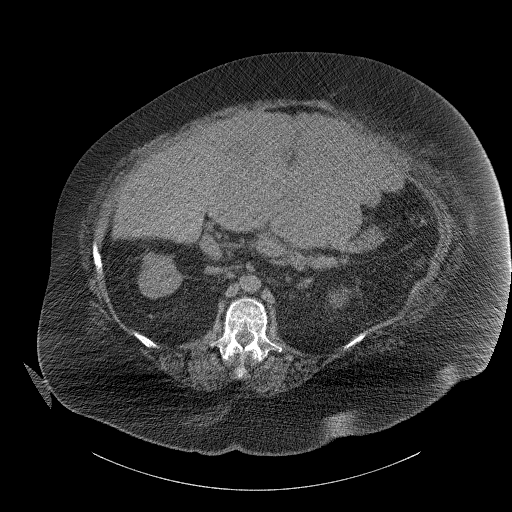
[im 36/56  bone]
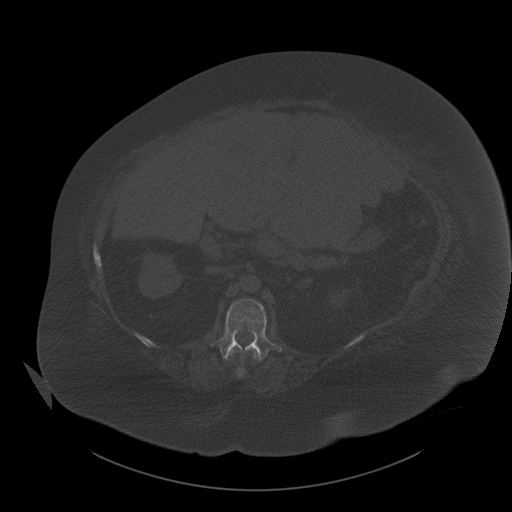
[im 41/56  soft-tissue]
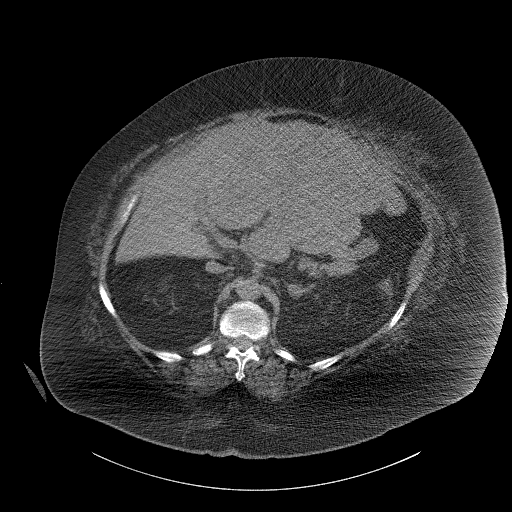
[im 43/56  soft-tissue]
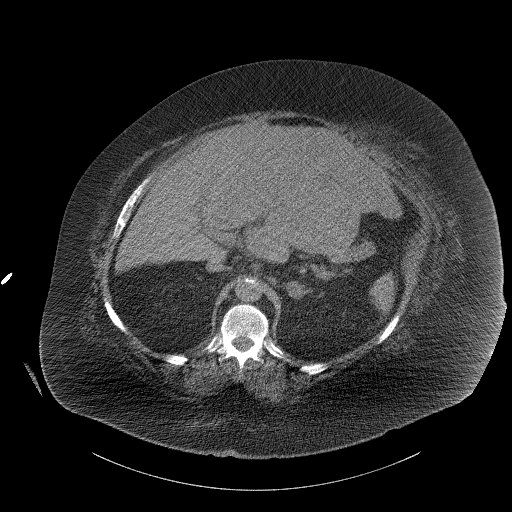
[im 46/56  lung]
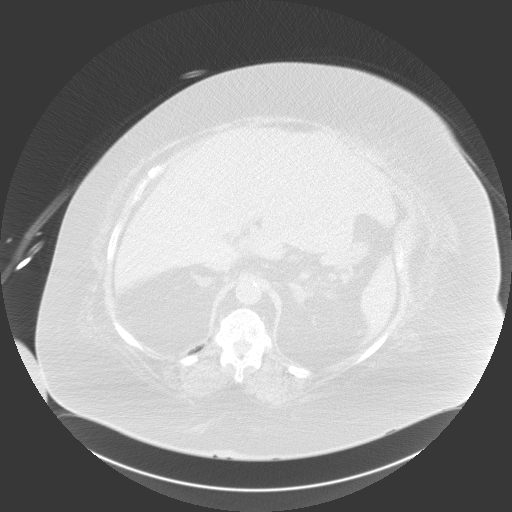
[im 48/56  soft-tissue]
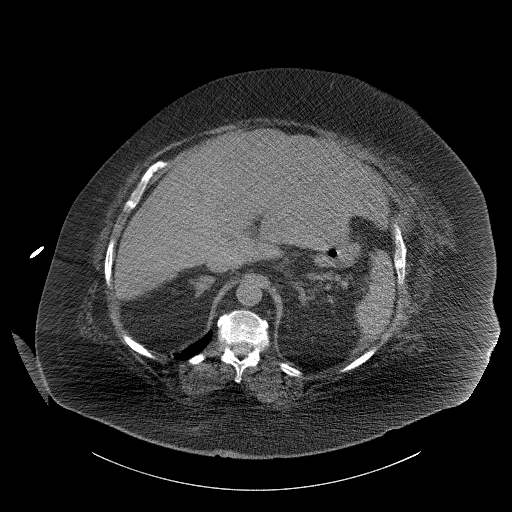
[im 48/56  lung]
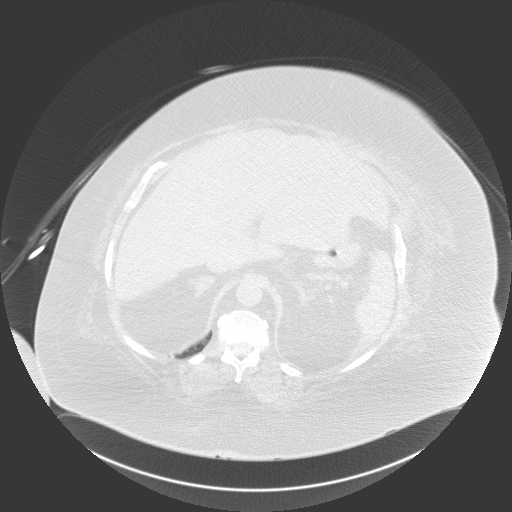
[im 51/56  lung]
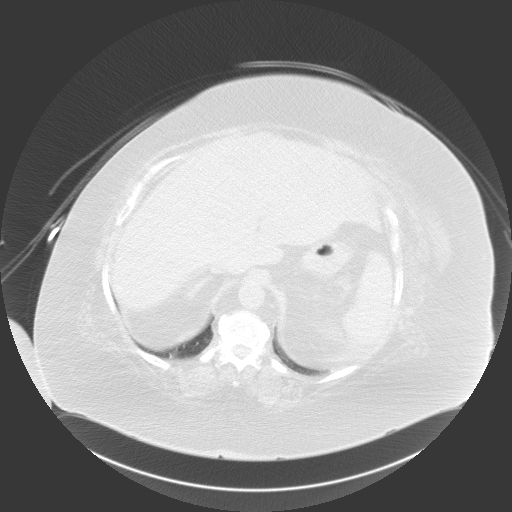
[im 53/56  soft-tissue]
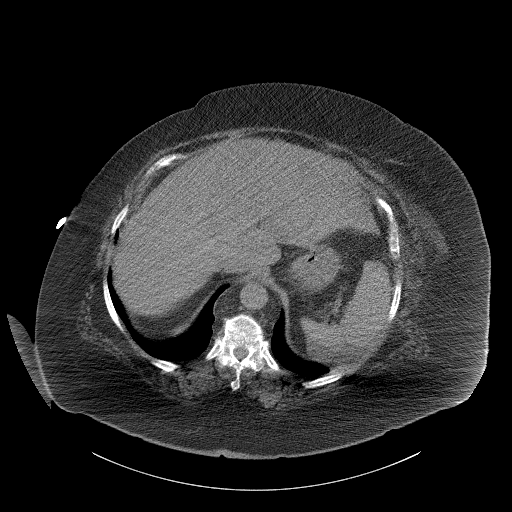
[im 53/56  lung]
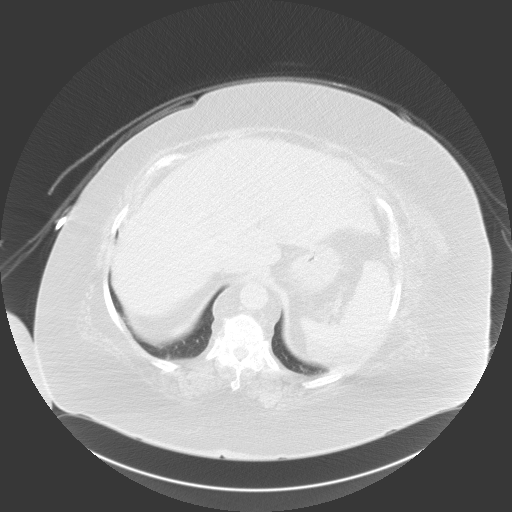

[15 of 32 positions shown; findings below may reference images not displayed]

EXAM:
CT GUIDED CORE BIOPSY OF PERITONEAL ABDOMINAL MASS

ANESTHESIA/SEDATION:
2.0 mg IV Versed; 100 mcg IV Fentanyl

Total Moderate Sedation Time:  10 minutes.

The patient's level of consciousness and physiologic status were
continuously monitored during the procedure by Radiology nursing.

PROCEDURE:
The procedure risks, benefits, and alternatives were explained to
the patient. Questions regarding the procedure were encouraged and
answered. The patient understands and consents to the procedure.

CT was performed through the lower abdomen and upper pelvis in a
supine position. The left abdominal wall was prepped with
chlorhexidine in a sterile fashion, and a sterile drape was applied
covering the operative field. A sterile gown and sterile gloves were
used for the procedure. Local anesthesia was provided with 1%
Lidocaine.

A 17 gauge trocar needle was advanced under CT guidance to the level
of a peritoneal abdominal mass. After confirming needle tip
position, coaxial 18 gauge core biopsy samples were obtained. Three
samples were obtained and submitted in formalin.

COMPLICATIONS:
None
FINDINGS: Lobulated soft tissue mass of the peritoneal cavity just deep to the
left abdominal wall in the left lower quadrant measures
approximately 3.1 x 3.7 cm. Solid tissue was obtained.
IMPRESSION: CT-guided core biopsy performed of left peritoneal abdominal mass.

## 2020-03-04 IMAGING — DX DG CHEST 1V PORT
1 series · 1 of 1 positions shown · non-contrast
Comparison: 08/13/2019

CLINICAL DATA: Dyspnea.

EXAM:
PORTABLE CHEST 1 VIEW

[chest ap]
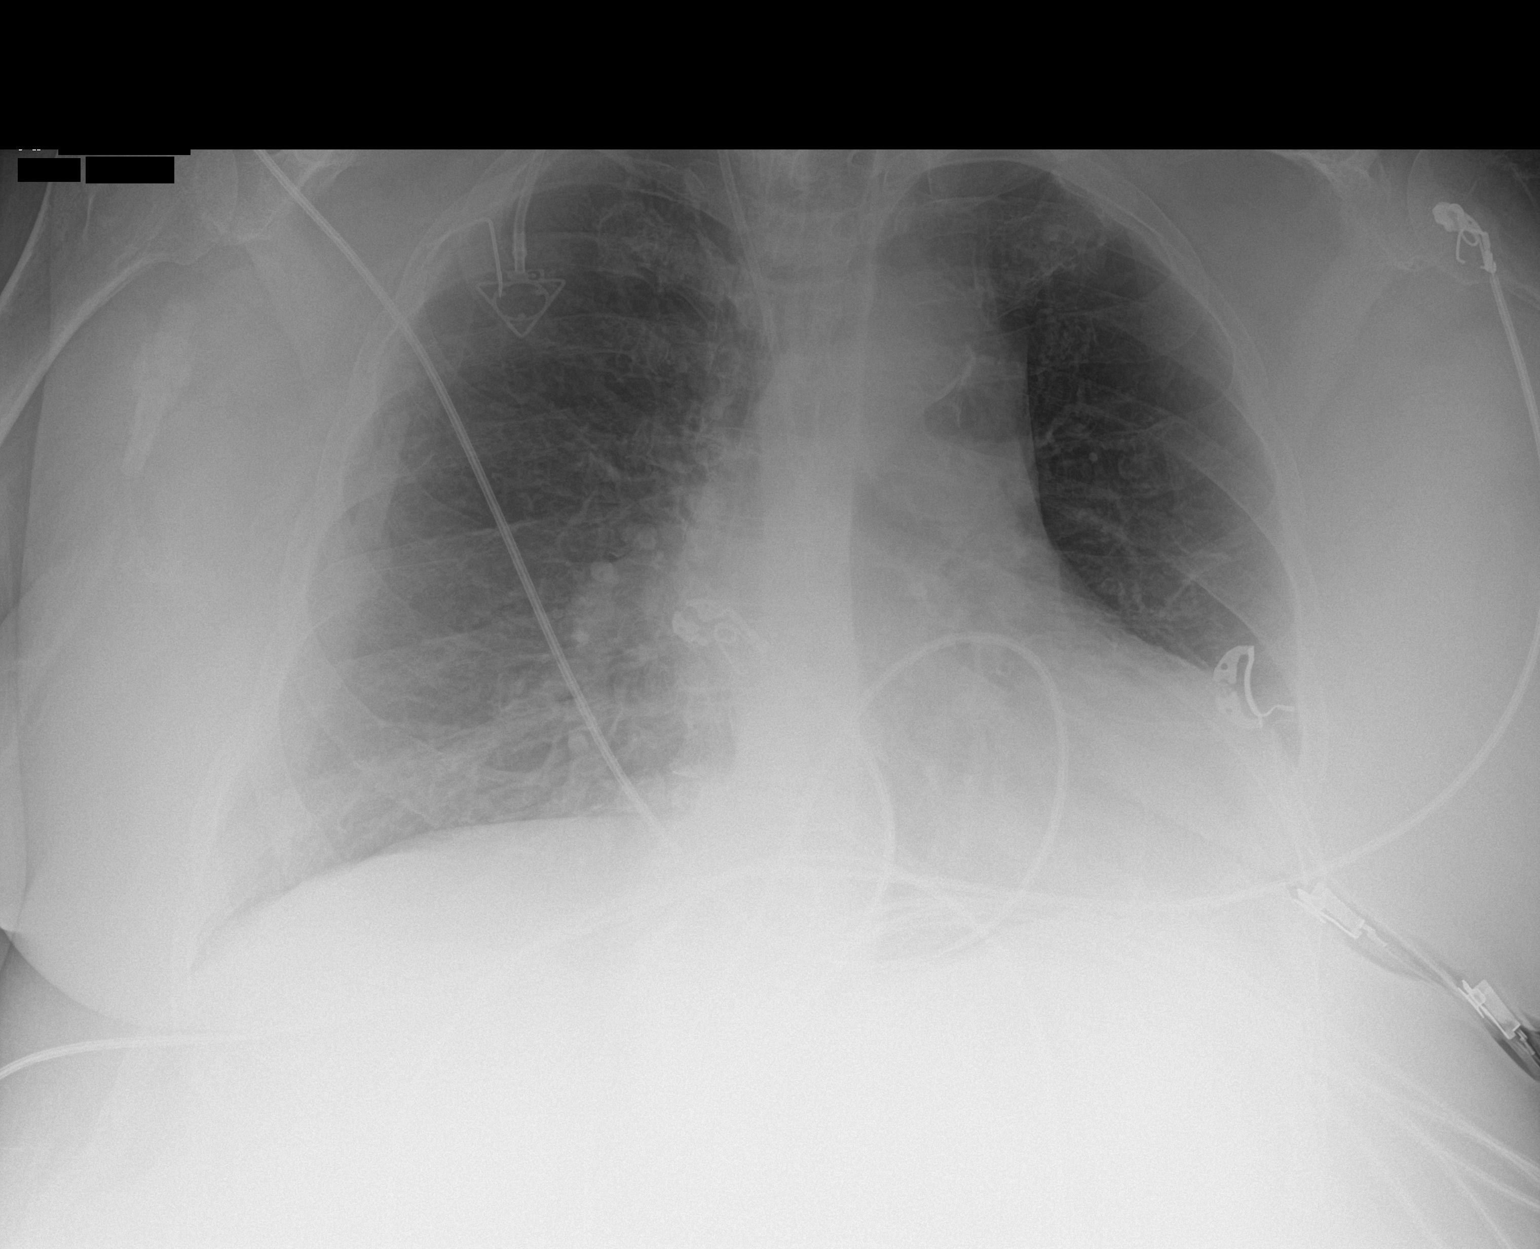

[1 of 1 positions shown; findings below may reference images not displayed]

FINDINGS: The heart size and pulmonary vascularity are and the lungs clear.
Aortic atherosclerosis.

New power port in place. Tip is in superior vena cava just below the
carina. No significant bone abnormality.
IMPRESSION: 1. No active disease.
2.  Aortic Atherosclerosis ([id number]).
3. Power port in good position.

## 2020-03-20 IMAGING — DX DG CHEST 1V PORT
1 series · 1 of 1 positions shown · non-contrast
Comparison: Chest radiograph 11/04/2019

CLINICAL DATA: Shortness of breath.

EXAM:
PORTABLE CHEST 1 VIEW

[chest ap]
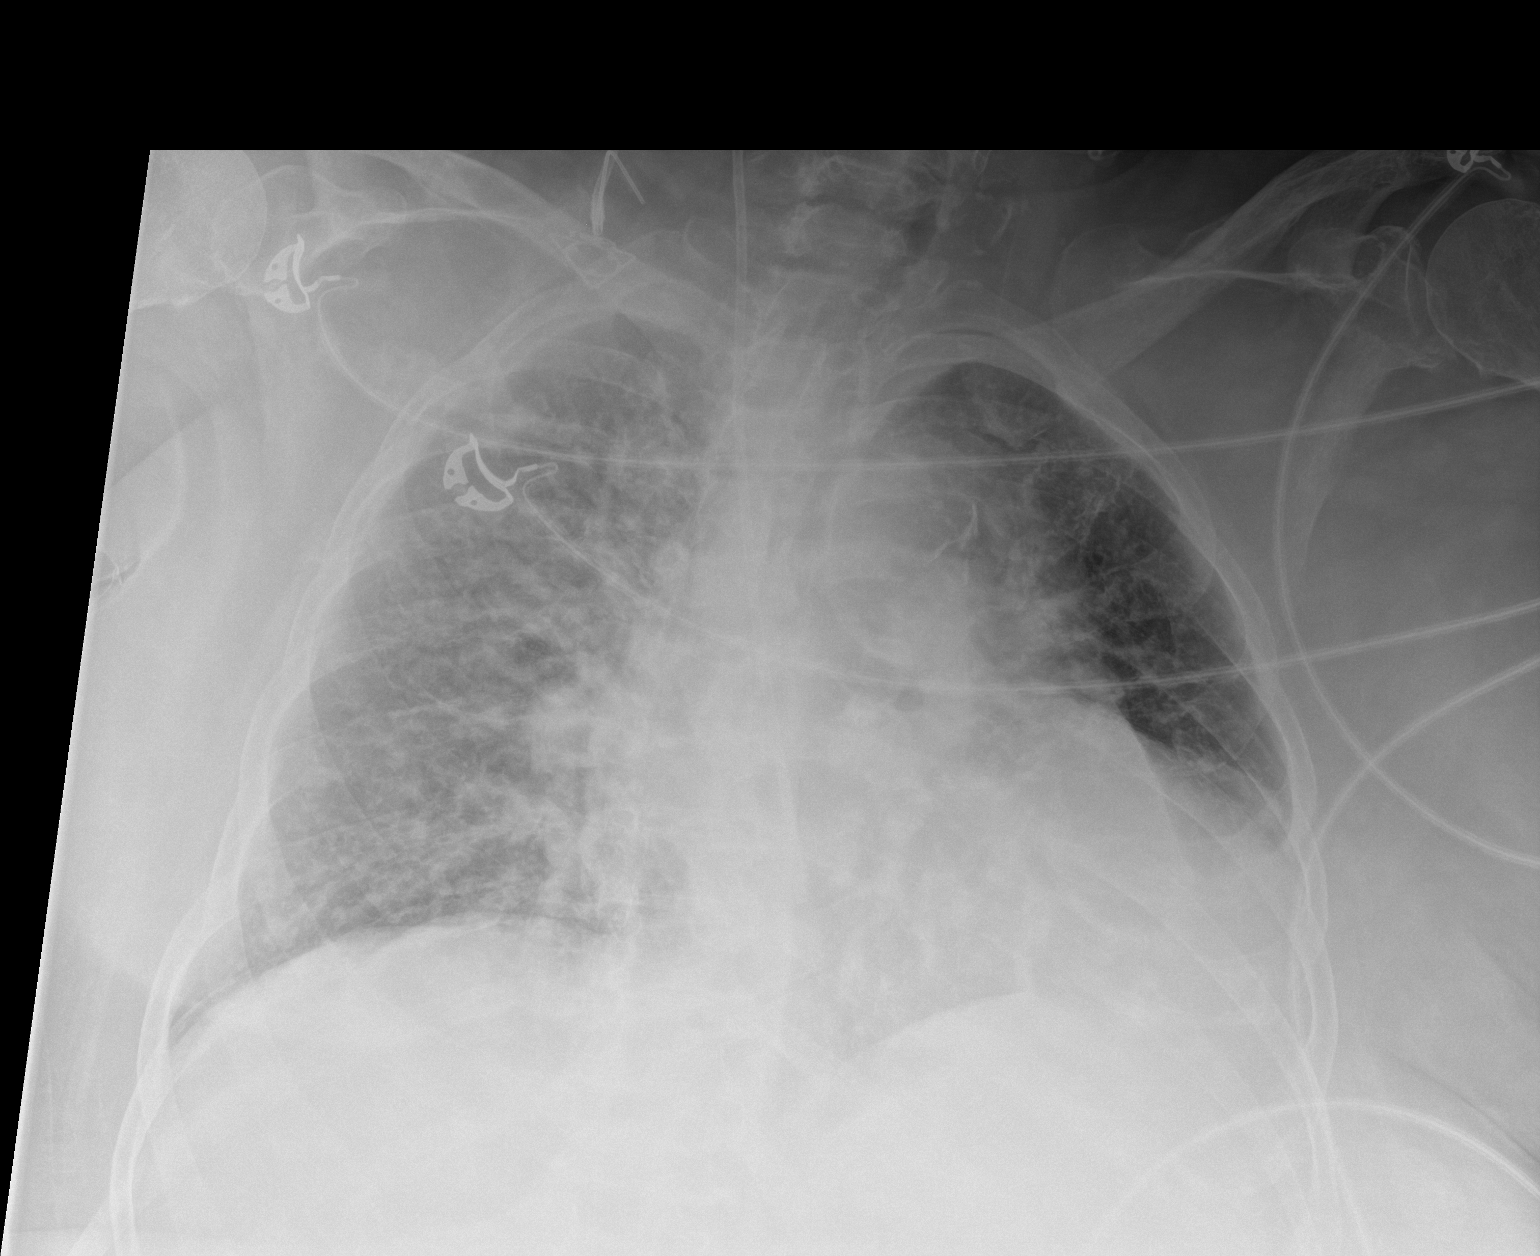

[1 of 1 positions shown; findings below may reference images not displayed]

FINDINGS: Right anterior chest wall Port-A-Cath is present with tip projecting
over the right atrium. Stable cardiomegaly. Aortic atherosclerosis.
Improved aeration of the right lung with persistent hazy
opacification. No definite pleural effusion or pneumothorax.
Thoracic spine degenerative changes.
IMPRESSION: Improved hazy opacities over the right lung may be secondary to
overlying soft tissues. Underlying effusion or pulmonary opacities
are not excluded.
